# Patient Record
Sex: Male | Born: 1945 | Race: White
Health system: Northeastern US, Academic
[De-identification: ages and names within clinical notes are randomized; demographics above are authoritative.]

## PROBLEM LIST (undated history)

## (undated) DIAGNOSIS — Z86018 Personal history of other benign neoplasm: Secondary | ICD-10-CM

## (undated) DIAGNOSIS — H5440 Blindness, one eye, unspecified eye: Secondary | ICD-10-CM

## (undated) DIAGNOSIS — N186 End stage renal disease: Secondary | ICD-10-CM

## (undated) DIAGNOSIS — N189 Chronic kidney disease, unspecified: Secondary | ICD-10-CM

## (undated) DIAGNOSIS — M1990 Unspecified osteoarthritis, unspecified site: Secondary | ICD-10-CM

## (undated) DIAGNOSIS — E119 Type 2 diabetes mellitus without complications: Secondary | ICD-10-CM

## (undated) DIAGNOSIS — C801 Malignant (primary) neoplasm, unspecified: Secondary | ICD-10-CM

## (undated) DIAGNOSIS — Z992 Dependence on renal dialysis: Secondary | ICD-10-CM

## (undated) DIAGNOSIS — D126 Benign neoplasm of colon, unspecified: Secondary | ICD-10-CM

## (undated) DIAGNOSIS — K2270 Barrett's esophagus without dysplasia: Secondary | ICD-10-CM

## (undated) DIAGNOSIS — N400 Enlarged prostate without lower urinary tract symptoms: Secondary | ICD-10-CM

## (undated) DIAGNOSIS — I10 Essential (primary) hypertension: Secondary | ICD-10-CM

## (undated) DIAGNOSIS — E785 Hyperlipidemia, unspecified: Secondary | ICD-10-CM

## (undated) HISTORY — PX: COLONOSCOPY: SHX5424

## (undated) HISTORY — PX: WHIPPLE PROCEDURE: SHX2667

## (undated) HISTORY — PX: CYSTOSCOPY: SUR368

## (undated) HISTORY — PX: COLECTOMY: SHX59

## (undated) HISTORY — PX: EYE SURGERY: SHX253

---

## 2011-08-30 DIAGNOSIS — I10 Essential (primary) hypertension: Secondary | ICD-10-CM | POA: Diagnosis present

## 2012-10-05 DIAGNOSIS — D126 Benign neoplasm of colon, unspecified: Secondary | ICD-10-CM | POA: Insufficient documentation

## 2012-12-15 DIAGNOSIS — K2270 Barrett's esophagus without dysplasia: Secondary | ICD-10-CM | POA: Insufficient documentation

## 2014-01-17 DIAGNOSIS — N400 Enlarged prostate without lower urinary tract symptoms: Secondary | ICD-10-CM | POA: Insufficient documentation

## 2014-07-12 ENCOUNTER — Inpatient Hospital Stay (HOSPITAL_COMMUNITY): Payer: Medicare PPO

## 2014-07-12 ENCOUNTER — Inpatient Hospital Stay (HOSPITAL_COMMUNITY): Payer: Medicare PPO | Admitting: Certified Registered Nurse Anesthetist

## 2014-07-12 ENCOUNTER — Emergency Department (HOSPITAL_COMMUNITY): Payer: Medicare PPO

## 2014-07-12 ENCOUNTER — Encounter (HOSPITAL_COMMUNITY): Payer: Self-pay

## 2014-07-12 ENCOUNTER — Inpatient Hospital Stay (HOSPITAL_COMMUNITY)
Admission: EM | Admit: 2014-07-12 | Discharge: 2014-07-22 | DRG: 956 | Disposition: A | Discharge status: Skilled Nursing Facility | Payer: Medicare PPO | Attending: Surgery | Admitting: Surgery

## 2014-07-12 ENCOUNTER — Encounter (HOSPITAL_COMMUNITY): Admission: EM | Disposition: A | Discharge status: Skilled Nursing Facility | Payer: Self-pay | Source: Home / Self Care

## 2014-07-12 DIAGNOSIS — G9340 Encephalopathy, unspecified: Secondary | ICD-10-CM | POA: Diagnosis not present

## 2014-07-12 DIAGNOSIS — J811 Chronic pulmonary edema: Secondary | ICD-10-CM

## 2014-07-12 DIAGNOSIS — S72111A Displaced fracture of greater trochanter of right femur, initial encounter for closed fracture: Secondary | ICD-10-CM | POA: Diagnosis present

## 2014-07-12 DIAGNOSIS — E874 Mixed disorder of acid-base balance: Secondary | ICD-10-CM | POA: Diagnosis present

## 2014-07-12 DIAGNOSIS — R4702 Dysphasia: Secondary | ICD-10-CM | POA: Diagnosis not present

## 2014-07-12 DIAGNOSIS — S2243XA Multiple fractures of ribs, bilateral, initial encounter for closed fracture: Secondary | ICD-10-CM | POA: Diagnosis present

## 2014-07-12 DIAGNOSIS — N320 Bladder-neck obstruction: Secondary | ICD-10-CM | POA: Diagnosis not present

## 2014-07-12 DIAGNOSIS — I711 Thoracic aortic aneurysm, ruptured, unspecified: Secondary | ICD-10-CM | POA: Diagnosis present

## 2014-07-12 DIAGNOSIS — R1311 Dysphagia, oral phase: Secondary | ICD-10-CM | POA: Diagnosis not present

## 2014-07-12 DIAGNOSIS — E1165 Type 2 diabetes mellitus with hyperglycemia: Secondary | ICD-10-CM | POA: Diagnosis present

## 2014-07-12 DIAGNOSIS — E871 Hypo-osmolality and hyponatremia: Secondary | ICD-10-CM | POA: Diagnosis not present

## 2014-07-12 DIAGNOSIS — D494 Neoplasm of unspecified behavior of bladder: Secondary | ICD-10-CM | POA: Diagnosis present

## 2014-07-12 DIAGNOSIS — I517 Cardiomegaly: Secondary | ICD-10-CM

## 2014-07-12 DIAGNOSIS — B3749 Other urogenital candidiasis: Secondary | ICD-10-CM | POA: Diagnosis present

## 2014-07-12 DIAGNOSIS — R6521 Severe sepsis with septic shock: Secondary | ICD-10-CM | POA: Diagnosis present

## 2014-07-12 DIAGNOSIS — Z933 Colostomy status: Secondary | ICD-10-CM

## 2014-07-12 DIAGNOSIS — R1313 Dysphagia, pharyngeal phase: Secondary | ICD-10-CM | POA: Diagnosis not present

## 2014-07-12 DIAGNOSIS — E875 Hyperkalemia: Secondary | ICD-10-CM | POA: Diagnosis not present

## 2014-07-12 DIAGNOSIS — E861 Hypovolemia: Secondary | ICD-10-CM | POA: Diagnosis not present

## 2014-07-12 DIAGNOSIS — R0689 Other abnormalities of breathing: Secondary | ICD-10-CM

## 2014-07-12 DIAGNOSIS — E876 Hypokalemia: Secondary | ICD-10-CM | POA: Diagnosis not present

## 2014-07-12 DIAGNOSIS — D62 Acute posthemorrhagic anemia: Secondary | ICD-10-CM | POA: Diagnosis not present

## 2014-07-12 DIAGNOSIS — R579 Shock, unspecified: Secondary | ICD-10-CM

## 2014-07-12 DIAGNOSIS — N210 Calculus in bladder: Secondary | ICD-10-CM | POA: Diagnosis present

## 2014-07-12 DIAGNOSIS — Z419 Encounter for procedure for purposes other than remedying health state, unspecified: Secondary | ICD-10-CM

## 2014-07-12 DIAGNOSIS — S2249XA Multiple fractures of ribs, unspecified side, initial encounter for closed fracture: Secondary | ICD-10-CM

## 2014-07-12 DIAGNOSIS — S2243XB Multiple fractures of ribs, bilateral, initial encounter for open fracture: Secondary | ICD-10-CM

## 2014-07-12 DIAGNOSIS — S2500XA Unspecified injury of thoracic aorta, initial encounter: Secondary | ICD-10-CM

## 2014-07-12 DIAGNOSIS — S0181XA Laceration without foreign body of other part of head, initial encounter: Secondary | ICD-10-CM | POA: Diagnosis present

## 2014-07-12 DIAGNOSIS — N179 Acute kidney failure, unspecified: Secondary | ICD-10-CM | POA: Diagnosis not present

## 2014-07-12 DIAGNOSIS — N132 Hydronephrosis with renal and ureteral calculous obstruction: Secondary | ICD-10-CM | POA: Diagnosis not present

## 2014-07-12 DIAGNOSIS — S2239XA Fracture of one rib, unspecified side, initial encounter for closed fracture: Secondary | ICD-10-CM

## 2014-07-12 DIAGNOSIS — T1490XA Injury, unspecified, initial encounter: Secondary | ICD-10-CM

## 2014-07-12 DIAGNOSIS — S2509XA Other specified injury of thoracic aorta, initial encounter: Principal | ICD-10-CM | POA: Diagnosis present

## 2014-07-12 DIAGNOSIS — J9600 Acute respiratory failure, unspecified whether with hypoxia or hypercapnia: Secondary | ICD-10-CM

## 2014-07-12 DIAGNOSIS — Z87891 Personal history of nicotine dependence: Secondary | ICD-10-CM

## 2014-07-12 DIAGNOSIS — Z9889 Other specified postprocedural states: Secondary | ICD-10-CM

## 2014-07-12 DIAGNOSIS — N189 Chronic kidney disease, unspecified: Secondary | ICD-10-CM | POA: Diagnosis not present

## 2014-07-12 DIAGNOSIS — S72001A Fracture of unspecified part of neck of right femur, initial encounter for closed fracture: Secondary | ICD-10-CM

## 2014-07-12 DIAGNOSIS — J9690 Respiratory failure, unspecified, unspecified whether with hypoxia or hypercapnia: Secondary | ICD-10-CM

## 2014-07-12 DIAGNOSIS — A419 Sepsis, unspecified organism: Secondary | ICD-10-CM | POA: Diagnosis not present

## 2014-07-12 DIAGNOSIS — Z23 Encounter for immunization: Secondary | ICD-10-CM | POA: Diagnosis not present

## 2014-07-12 DIAGNOSIS — Z418 Encounter for other procedures for purposes other than remedying health state: Secondary | ICD-10-CM

## 2014-07-12 DIAGNOSIS — S72009A Fracture of unspecified part of neck of unspecified femur, initial encounter for closed fracture: Secondary | ICD-10-CM

## 2014-07-12 DIAGNOSIS — S3502XA Major laceration of abdominal aorta, initial encounter: Secondary | ICD-10-CM

## 2014-07-12 DIAGNOSIS — S299XXA Unspecified injury of thorax, initial encounter: Secondary | ICD-10-CM

## 2014-07-12 HISTORY — DX: Blindness, one eye, unspecified eye: H54.40

## 2014-07-12 HISTORY — DX: Chronic kidney disease, unspecified: N18.9

## 2014-07-12 HISTORY — PX: THORACIC AORTIC ENDOVASCULAR STENT GRAFT: SHX6112

## 2014-07-12 HISTORY — DX: Personal history of other benign neoplasm: Z86.018

## 2014-07-12 LAB — PROTIME-INR
INR: 1.2 (ref 0.00–1.49)
INR: 1.44 (ref 0.00–1.49)
Prothrombin Time: 15.3 seconds — ABNORMAL HIGH (ref 11.6–15.2)
Prothrombin Time: 17.7 seconds — ABNORMAL HIGH (ref 11.6–15.2)

## 2014-07-12 LAB — URINE MICROSCOPIC-ADD ON

## 2014-07-12 LAB — POCT I-STAT 7, (LYTES, BLD GAS, ICA,H+H)
ACID-BASE DEFICIT: 10 mmol/L — AB (ref 0.0–2.0)
ACID-BASE DEFICIT: 9 mmol/L — AB (ref 0.0–2.0)
BICARBONATE: 16.9 meq/L — AB (ref 20.0–24.0)
BICARBONATE: 16.7 meq/L — AB (ref 20.0–24.0)
Calcium, Ion: 1.35 mmol/L — ABNORMAL HIGH (ref 1.13–1.30)
Calcium, Ion: 1.18 mmol/L (ref 1.13–1.30)
HCT: 23 % — ABNORMAL LOW (ref 39.0–52.0)
HEMATOCRIT: 23 % — AB (ref 39.0–52.0)
Hemoglobin: 7.8 g/dL — ABNORMAL LOW (ref 13.0–17.0)
Hemoglobin: 7.8 g/dL — ABNORMAL LOW (ref 13.0–17.0)
O2 SAT: 100 %
O2 Saturation: 100 %
PH ART: 7.252 — AB (ref 7.350–7.450)
POTASSIUM: 5.1 meq/L (ref 3.7–5.3)
Patient temperature: 36.9
Patient temperature: 36.2
Potassium: 5.5 mEq/L — ABNORMAL HIGH (ref 3.7–5.3)
SODIUM: 129 meq/L — AB (ref 137–147)
Sodium: 131 mEq/L — ABNORMAL LOW (ref 137–147)
TCO2: 18 mmol/L (ref 0–100)
TCO2: 18 mmol/L (ref 0–100)
pCO2 arterial: 38.2 mmHg (ref 35.0–45.0)
pCO2 arterial: 34.8 mmHg — ABNORMAL LOW (ref 35.0–45.0)
pH, Arterial: 7.284 — ABNORMAL LOW (ref 7.350–7.450)
pO2, Arterial: 329 mmHg — ABNORMAL HIGH (ref 80.0–100.0)
pO2, Arterial: 318 mmHg — ABNORMAL HIGH (ref 80.0–100.0)

## 2014-07-12 LAB — CBC WITH DIFFERENTIAL/PLATELET
BASOS ABS: 0 10*3/uL (ref 0.0–0.1)
Basophils Relative: 0 % (ref 0–1)
EOS ABS: 0 10*3/uL (ref 0.0–0.7)
Eosinophils Relative: 0 % (ref 0–5)
HCT: 32.6 % — ABNORMAL LOW (ref 39.0–52.0)
Hemoglobin: 10.7 g/dL — ABNORMAL LOW (ref 13.0–17.0)
LYMPHS ABS: 2.8 10*3/uL (ref 0.7–4.0)
Lymphocytes Relative: 11 % — ABNORMAL LOW (ref 12–46)
MCH: 28.9 pg (ref 26.0–34.0)
MCHC: 32.8 g/dL (ref 30.0–36.0)
MCV: 88.1 fL (ref 78.0–100.0)
MONOS PCT: 12 % (ref 3–12)
Monocytes Absolute: 3 10*3/uL — ABNORMAL HIGH (ref 0.1–1.0)
NEUTROS ABS: 19.2 10*3/uL — AB (ref 1.7–7.7)
Neutrophils Relative %: 77 % (ref 43–77)
PLATELETS: 190 10*3/uL (ref 150–400)
RBC: 3.7 MIL/uL — ABNORMAL LOW (ref 4.22–5.81)
RDW: 14 % (ref 11.5–15.5)
WBC: 25 10*3/uL — ABNORMAL HIGH (ref 4.0–10.5)

## 2014-07-12 LAB — POCT I-STAT 3, ART BLOOD GAS (G3+)
Acid-base deficit: 7 mmol/L — ABNORMAL HIGH (ref 0.0–2.0)
Bicarbonate: 18.5 mEq/L — ABNORMAL LOW (ref 20.0–24.0)
O2 Saturation: 100 %
PCO2 ART: 37 mmHg (ref 35.0–45.0)
PH ART: 7.304 — AB (ref 7.350–7.450)
TCO2: 20 mmol/L (ref 0–100)
pO2, Arterial: 190 mmHg — ABNORMAL HIGH (ref 80.0–100.0)

## 2014-07-12 LAB — COMPREHENSIVE METABOLIC PANEL
ALBUMIN: 3.6 g/dL (ref 3.5–5.2)
ALK PHOS: 101 U/L (ref 39–117)
ALT: 91 U/L — ABNORMAL HIGH (ref 0–53)
AST: 175 U/L — AB (ref 0–37)
Anion gap: 23 — ABNORMAL HIGH (ref 5–15)
BUN: 49 mg/dL — AB (ref 6–23)
CO2: 14 mEq/L — ABNORMAL LOW (ref 19–32)
Calcium: 9.8 mg/dL (ref 8.4–10.5)
Chloride: 91 mEq/L — ABNORMAL LOW (ref 96–112)
Creatinine, Ser: 4.31 mg/dL — ABNORMAL HIGH (ref 0.50–1.35)
GFR calc Af Amer: 15 mL/min — ABNORMAL LOW (ref >90)
GFR calc non Af Amer: 13 mL/min — ABNORMAL LOW (ref >90)
Glucose, Bld: 414 mg/dL — ABNORMAL HIGH (ref 70–99)
POTASSIUM: 5.6 meq/L — AB (ref 3.7–5.3)
Sodium: 128 mEq/L — ABNORMAL LOW (ref 137–147)
TOTAL PROTEIN: 7.9 g/dL (ref 6.0–8.3)
Total Bilirubin: 1.1 mg/dL (ref 0.3–1.2)

## 2014-07-12 LAB — I-STAT CHEM 8, ED
BUN: 52 mg/dL — AB (ref 6–23)
CHLORIDE: 102 meq/L (ref 96–112)
CREATININE: 4.7 mg/dL — AB (ref 0.50–1.35)
Calcium, Ion: 1.08 mmol/L — ABNORMAL LOW (ref 1.13–1.30)
Glucose, Bld: 432 mg/dL — ABNORMAL HIGH (ref 70–99)
HCT: 41 % (ref 39.0–52.0)
Hemoglobin: 13.9 g/dL (ref 13.0–17.0)
Potassium: 5.9 mEq/L — ABNORMAL HIGH (ref 3.7–5.3)
Sodium: 126 mEq/L — ABNORMAL LOW (ref 137–147)
TCO2: 13 mmol/L (ref 0–100)

## 2014-07-12 LAB — I-STAT VENOUS BLOOD GAS, ED
Acid-base deficit: 12 mmol/L — ABNORMAL HIGH (ref 0.0–2.0)
BICARBONATE: 16.4 meq/L — AB (ref 20.0–24.0)
O2 Saturation: 56 %
PCO2 VEN: 49.7 mmHg (ref 45.0–50.0)
PH VEN: 7.14 — AB (ref 7.250–7.300)
Patient temperature: 102.4
TCO2: 18 mmol/L (ref 0–100)
pO2, Ven: 43 mmHg (ref 30.0–45.0)

## 2014-07-12 LAB — CBC
HEMATOCRIT: 32.5 % — AB (ref 39.0–52.0)
Hemoglobin: 10.9 g/dL — ABNORMAL LOW (ref 13.0–17.0)
MCH: 28.8 pg (ref 26.0–34.0)
MCHC: 33.5 g/dL (ref 30.0–36.0)
MCV: 85.8 fL (ref 78.0–100.0)
PLATELETS: 116 10*3/uL — AB (ref 150–400)
RBC: 3.79 MIL/uL — AB (ref 4.22–5.81)
RDW: 14 % (ref 11.5–15.5)
WBC: 11.8 10*3/uL — AB (ref 4.0–10.5)

## 2014-07-12 LAB — RAPID URINE DRUG SCREEN, HOSP PERFORMED
Amphetamines: NOT DETECTED
BARBITURATES: NOT DETECTED
BENZODIAZEPINES: POSITIVE — AB
Cocaine: NOT DETECTED
Opiates: POSITIVE — AB
Tetrahydrocannabinol: NOT DETECTED

## 2014-07-12 LAB — CBG MONITORING, ED: GLUCOSE-CAPILLARY: 391 mg/dL — AB (ref 70–99)

## 2014-07-12 LAB — BASIC METABOLIC PANEL
Anion gap: 13 (ref 5–15)
Anion gap: 14 (ref 5–15)
BUN: 45 mg/dL — AB (ref 6–23)
BUN: 40 mg/dL — AB (ref 6–23)
CALCIUM: 7.9 mg/dL — AB (ref 8.4–10.5)
CO2: 18 mEq/L — ABNORMAL LOW (ref 19–32)
CO2: 17 mEq/L — ABNORMAL LOW (ref 19–32)
Calcium: 8.3 mg/dL — ABNORMAL LOW (ref 8.4–10.5)
Chloride: 100 mEq/L (ref 96–112)
Chloride: 104 mEq/L (ref 96–112)
Creatinine, Ser: 3.75 mg/dL — ABNORMAL HIGH (ref 0.50–1.35)
Creatinine, Ser: 3.5 mg/dL — ABNORMAL HIGH (ref 0.50–1.35)
GFR calc Af Amer: 18 mL/min — ABNORMAL LOW (ref >90)
GFR calc Af Amer: 19 mL/min — ABNORMAL LOW (ref >90)
GFR, EST NON AFRICAN AMERICAN: 15 mL/min — AB (ref >90)
GFR, EST NON AFRICAN AMERICAN: 16 mL/min — AB (ref >90)
GLUCOSE: 107 mg/dL — AB (ref 70–99)
Glucose, Bld: 327 mg/dL — ABNORMAL HIGH (ref 70–99)
POTASSIUM: 4 meq/L (ref 3.7–5.3)
Potassium: 5.6 mEq/L — ABNORMAL HIGH (ref 3.7–5.3)
Sodium: 131 mEq/L — ABNORMAL LOW (ref 137–147)
Sodium: 135 mEq/L — ABNORMAL LOW (ref 137–147)

## 2014-07-12 LAB — URINALYSIS, ROUTINE W REFLEX MICROSCOPIC
Bilirubin Urine: NEGATIVE
GLUCOSE, UA: 250 mg/dL — AB
Ketones, ur: 15 mg/dL — AB
Nitrite: POSITIVE — AB
Protein, ur: >300 mg/dL — AB
Specific Gravity, Urine: 1.014 (ref 1.005–1.030)
Urobilinogen, UA: 0.2 mg/dL (ref 0.0–1.0)
pH: 6 (ref 5.0–8.0)

## 2014-07-12 LAB — PREPARE FRESH FROZEN PLASMA
UNIT DIVISION: 0
Unit division: 0

## 2014-07-12 LAB — POCT I-STAT 4, (NA,K, GLUC, HGB,HCT)
Glucose, Bld: 339 mg/dL — ABNORMAL HIGH (ref 70–99)
HCT: 26 % — ABNORMAL LOW (ref 39.0–52.0)
Hemoglobin: 8.8 g/dL — ABNORMAL LOW (ref 13.0–17.0)
POTASSIUM: 5.5 meq/L — AB (ref 3.7–5.3)
SODIUM: 133 meq/L — AB (ref 137–147)

## 2014-07-12 LAB — CG4 I-STAT (LACTIC ACID): Lactic Acid, Venous: 3.85 mmol/L — ABNORMAL HIGH (ref 0.5–2.2)

## 2014-07-12 LAB — PREPARE RBC (CROSSMATCH)

## 2014-07-12 LAB — APTT: APTT: 35 s (ref 24–37)

## 2014-07-12 LAB — GLUCOSE, CAPILLARY
GLUCOSE-CAPILLARY: 319 mg/dL — AB (ref 70–99)
GLUCOSE-CAPILLARY: 287 mg/dL — AB (ref 70–99)
GLUCOSE-CAPILLARY: 250 mg/dL — AB (ref 70–99)
Glucose-Capillary: 201 mg/dL — ABNORMAL HIGH (ref 70–99)

## 2014-07-12 LAB — TROPONIN I

## 2014-07-12 LAB — MRSA PCR SCREENING: MRSA BY PCR: NEGATIVE

## 2014-07-12 LAB — ETHANOL

## 2014-07-12 LAB — I-STAT CG4 LACTIC ACID, ED: LACTIC ACID, VENOUS: 4.58 mmol/L — AB (ref 0.5–2.2)

## 2014-07-12 LAB — MAGNESIUM: Magnesium: 1.4 mg/dL — ABNORMAL LOW (ref 1.5–2.5)

## 2014-07-12 LAB — ABO/RH: ABO/RH(D): A POS

## 2014-07-12 LAB — TRIGLYCERIDES: TRIGLYCERIDES: 191 mg/dL — AB (ref <150)

## 2014-07-12 LAB — LACTIC ACID, PLASMA
Lactic Acid, Venous: 3 mmol/L — ABNORMAL HIGH (ref 0.5–2.2)
Lactic Acid, Venous: 2 mmol/L (ref 0.5–2.2)

## 2014-07-12 SURGERY — INSERTION, ENDOVASCULAR STENT GRAFT, AORTA, THORACIC
Anesthesia: General | Site: Chest

## 2014-07-12 MED ORDER — TETANUS-DIPHTHERIA TOXOIDS TD 5-2 LFU IM INJ
0.5000 mL | INJECTION | Freq: Once | INTRAMUSCULAR | Status: AC
  Administered 2014-07-12: 0.5 mL via INTRAMUSCULAR

## 2014-07-12 MED ORDER — SODIUM CHLORIDE 0.45 % IV BOLUS: 500.0000 mL | Freq: Once | INTRAVENOUS | Status: DC

## 2014-07-12 MED ORDER — ROCURONIUM BROMIDE 100 MG/10ML IV SOLN
INTRAVENOUS | Status: DC | PRN
  Administered 2014-07-12 (×2): 50 mg via INTRAVENOUS

## 2014-07-12 MED ORDER — VANCOMYCIN HCL IN DEXTROSE 1-5 GM/200ML-% IV SOLN
1000.0000 mg | INTRAVENOUS | Status: DC
  Administered 2014-07-14: 1000 mg via INTRAVENOUS
  Filled 2014-07-12: qty 200

## 2014-07-12 MED ORDER — MORPHINE SULFATE 2 MG/ML IJ SOLN
2.0000 mg | INTRAMUSCULAR | Status: DC | PRN
  Administered 2014-07-15: 4 mg via INTRAVENOUS
  Administered 2014-07-15: 2 mg via INTRAVENOUS
  Administered 2014-07-15 – 2014-07-16 (×3): 4 mg via INTRAVENOUS
  Filled 2014-07-12: qty 2
  Filled 2014-07-12: qty 1
  Filled 2014-07-12 (×3): qty 2

## 2014-07-12 MED ORDER — TETANUS-DIPHTH-ACELL PERTUSSIS 5-2.5-18.5 LF-MCG/0.5 IM SUSP
0.5000 mL | Freq: Once | INTRAMUSCULAR | Status: DC
  Filled 2014-07-12: qty 0.5

## 2014-07-12 MED ORDER — HEPARIN SODIUM (PORCINE) 1000 UNIT/ML IJ SOLN
INTRAMUSCULAR | Status: AC
  Filled 2014-07-12: qty 1

## 2014-07-12 MED ORDER — SUCCINYLCHOLINE CHLORIDE 20 MG/ML IJ SOLN
INTRAMUSCULAR | Status: AC
  Administered 2014-07-12: 07:00:00
  Filled 2014-07-12: qty 1

## 2014-07-12 MED ORDER — SODIUM CHLORIDE 0.9 % IV SOLN
25.0000 ug/h | INTRAVENOUS | Status: DC
  Administered 2014-07-12: 150 ug/h via INTRAVENOUS
  Administered 2014-07-12: 60 ug/h via INTRAVENOUS
  Administered 2014-07-13 – 2014-07-14 (×2): 150 ug/h via INTRAVENOUS
  Administered 2014-07-14: 200 ug/h via INTRAVENOUS
  Administered 2014-07-15: 150 ug/h via INTRAVENOUS
  Filled 2014-07-12 (×4): qty 50

## 2014-07-12 MED ORDER — ROCURONIUM BROMIDE 50 MG/5ML IV SOLN
INTRAVENOUS | Status: AC
  Filled 2014-07-12: qty 2

## 2014-07-12 MED ORDER — SODIUM CHLORIDE 0.9 % IV BOLUS (SEPSIS)
1000.0000 mL | Freq: Once | INTRAVENOUS | Status: AC
  Administered 2014-07-12: 1000 mL via INTRAVENOUS

## 2014-07-12 MED ORDER — CETYLPYRIDINIUM CHLORIDE 0.05 % MT LIQD
7.0000 mL | Freq: Four times a day (QID) | OROMUCOSAL | Status: DC
  Administered 2014-07-12 – 2014-07-22 (×40): 7 mL via OROMUCOSAL

## 2014-07-12 MED ORDER — POTASSIUM CHLORIDE CRYS ER 20 MEQ PO TBCR
20.0000 meq | EXTENDED_RELEASE_TABLET | Freq: Every day | ORAL | Status: AC | PRN
Start: 2014-07-12 — End: 2014-07-19
  Administered 2014-07-19: 40 meq via ORAL
  Filled 2014-07-12: qty 2

## 2014-07-12 MED ORDER — FENTANYL CITRATE 0.05 MG/ML IJ SOLN
INTRAMUSCULAR | Status: AC
  Filled 2014-07-12: qty 5

## 2014-07-12 MED ORDER — ALUM & MAG HYDROXIDE-SIMETH 200-200-20 MG/5ML PO SUSP: 15.0000 mL | ORAL | Status: DC | PRN

## 2014-07-12 MED ORDER — PHENYLEPHRINE HCL 10 MG/ML IJ SOLN
10.0000 mg | INTRAVENOUS | Status: DC | PRN
  Administered 2014-07-12: 10 ug/min via INTRAVENOUS

## 2014-07-12 MED ORDER — INSULIN ASPART 100 UNIT/ML IV SOLN
10.0000 [IU] | Freq: Once | INTRAVENOUS | Status: AC
  Administered 2014-07-12: 10 [IU] via INTRAVENOUS
  Filled 2014-07-12: qty 1

## 2014-07-12 MED ORDER — ROCURONIUM BROMIDE 50 MG/5ML IV SOLN
INTRAVENOUS | Status: AC | PRN
  Administered 2014-07-12: 50 mg via INTRAVENOUS

## 2014-07-12 MED ORDER — SODIUM CHLORIDE 0.9 % IV SOLN
250.0000 [IU] | INTRAVENOUS | Status: DC | PRN
  Administered 2014-07-12: 8.4 [IU]/h via INTRAVENOUS

## 2014-07-12 MED ORDER — FENTANYL CITRATE 0.05 MG/ML IJ SOLN
INTRAMUSCULAR | Status: AC
  Filled 2014-07-12: qty 2

## 2014-07-12 MED ORDER — PHENYLEPHRINE 40 MCG/ML (10ML) SYRINGE FOR IV PUSH (FOR BLOOD PRESSURE SUPPORT)
PREFILLED_SYRINGE | INTRAVENOUS | Status: AC
  Filled 2014-07-12: qty 10

## 2014-07-12 MED ORDER — PANTOPRAZOLE SODIUM 40 MG IV SOLR
40.0000 mg | Freq: Every day | INTRAVENOUS | Status: DC
  Administered 2014-07-12 – 2014-07-19 (×8): 40 mg via INTRAVENOUS
  Filled 2014-07-12 (×9): qty 40

## 2014-07-12 MED ORDER — GUAIFENESIN-DM 100-10 MG/5ML PO SYRP: 15.0000 mL | ORAL_SOLUTION | ORAL | Status: DC | PRN

## 2014-07-12 MED ORDER — SODIUM CHLORIDE 0.9 % IV SOLN
INTRAVENOUS | Status: AC
  Administered 2014-07-12: 8.4 [IU]/h via INTRAVENOUS
  Filled 2014-07-12: qty 2.5

## 2014-07-12 MED ORDER — LACTATED RINGERS IV SOLN
INTRAVENOUS | Status: DC | PRN
  Administered 2014-07-12: 11:00:00 via INTRAVENOUS

## 2014-07-12 MED ORDER — CALCIUM CHLORIDE 10 % IV SOLN
INTRAVENOUS | Status: AC
  Filled 2014-07-12: qty 10

## 2014-07-12 MED ORDER — LIDOCAINE-EPINEPHRINE 1 %-1:100000 IJ SOLN
10.0000 mL | Freq: Once | INTRAMUSCULAR | Status: AC
  Administered 2014-07-12: 10 mL via INTRADERMAL
  Filled 2014-07-12: qty 1

## 2014-07-12 MED ORDER — DEXTROSE 5 % IV SOLN
2.0000 g | INTRAVENOUS | Status: DC
  Administered 2014-07-12 – 2014-07-13 (×2): 2 g via INTRAVENOUS
  Filled 2014-07-12: qty 2

## 2014-07-12 MED ORDER — PIPERACILLIN-TAZOBACTAM 3.375 G IVPB
3.3750 g | Freq: Three times a day (TID) | INTRAVENOUS | Status: DC
  Administered 2014-07-12 – 2014-07-14 (×7): 3.375 g via INTRAVENOUS
  Filled 2014-07-12 (×9): qty 50

## 2014-07-12 MED ORDER — HYDRALAZINE HCL 20 MG/ML IJ SOLN
5.0000 mg | INTRAMUSCULAR | Status: AC | PRN
  Administered 2014-07-12 – 2014-07-20 (×2): 5 mg via INTRAVENOUS
  Filled 2014-07-12 (×3): qty 1

## 2014-07-12 MED ORDER — PROPOFOL 10 MG/ML IV EMUL
INTRAVENOUS | Status: AC
  Filled 2014-07-12: qty 100

## 2014-07-12 MED ORDER — MIDAZOLAM HCL 2 MG/2ML IJ SOLN
INTRAMUSCULAR | Status: AC
  Filled 2014-07-12: qty 2

## 2014-07-12 MED ORDER — PIPERACILLIN-TAZOBACTAM 3.375 G IVPB: 3.3750 g | Freq: Three times a day (TID) | INTRAVENOUS | Status: DC

## 2014-07-12 MED ORDER — PHENOL 1.4 % MT LIQD: 1.0000 | OROMUCOSAL | Status: DC | PRN

## 2014-07-12 MED ORDER — OXYCODONE-ACETAMINOPHEN 5-325 MG PO TABS
1.0000 | ORAL_TABLET | ORAL | Status: DC | PRN
  Administered 2014-07-16 – 2014-07-18 (×8): 2 via ORAL
  Administered 2014-07-18: 1 via ORAL
  Administered 2014-07-18 – 2014-07-21 (×10): 2 via ORAL
  Filled 2014-07-12 (×14): qty 2
  Filled 2014-07-12: qty 1
  Filled 2014-07-12 (×4): qty 2

## 2014-07-12 MED ORDER — CEFAZOLIN SODIUM-DEXTROSE 2-3 GM-% IV SOLR
INTRAVENOUS | Status: DC | PRN
  Administered 2014-07-12: 2 g via INTRAVENOUS

## 2014-07-12 MED ORDER — SODIUM CHLORIDE 0.9 % IV SOLN
INTRAVENOUS | Status: DC | PRN
  Administered 2014-07-12: 11:00:00 via INTRAVENOUS

## 2014-07-12 MED ORDER — ONDANSETRON HCL 4 MG/2ML IJ SOLN
INTRAMUSCULAR | Status: AC
  Filled 2014-07-12: qty 2

## 2014-07-12 MED ORDER — SUCCINYLCHOLINE CHLORIDE 20 MG/ML IJ SOLN
INTRAMUSCULAR | Status: AC
  Filled 2014-07-12: qty 1

## 2014-07-12 MED ORDER — ONDANSETRON HCL 4 MG/2ML IJ SOLN: 4.0000 mg | Freq: Four times a day (QID) | INTRAMUSCULAR | Status: DC | PRN

## 2014-07-12 MED ORDER — ROCURONIUM BROMIDE 50 MG/5ML IV SOLN
INTRAVENOUS | Status: AC
  Filled 2014-07-12: qty 3

## 2014-07-12 MED ORDER — VANCOMYCIN HCL IN DEXTROSE 1-5 GM/200ML-% IV SOLN: 1000.0000 mg | INTRAVENOUS | Status: DC

## 2014-07-12 MED ORDER — FENTANYL CITRATE 0.05 MG/ML IJ SOLN: 50.0000 ug | Freq: Once | INTRAMUSCULAR | Status: DC

## 2014-07-12 MED ORDER — SODIUM CHLORIDE 0.9 % IV SOLN: INTRAVENOUS | Status: DC | PRN

## 2014-07-12 MED ORDER — METOPROLOL TARTRATE 1 MG/ML IV SOLN
2.0000 mg | INTRAVENOUS | Status: DC | PRN
  Filled 2014-07-12: qty 5

## 2014-07-12 MED ORDER — SODIUM CHLORIDE 0.9 % IV SOLN: 500.0000 mL | Freq: Once | INTRAVENOUS | Status: AC | PRN

## 2014-07-12 MED ORDER — CALCIUM GLUCONATE 10 % IV SOLN: 1.0000 g | Freq: Once | INTRAVENOUS | Status: DC

## 2014-07-12 MED ORDER — ETOMIDATE 2 MG/ML IV SOLN
INTRAVENOUS | Status: AC
  Filled 2014-07-12: qty 20

## 2014-07-12 MED ORDER — PIPERACILLIN-TAZOBACTAM 3.375 G IVPB 30 MIN
3.3750 g | Freq: Once | INTRAVENOUS | Status: DC
  Filled 2014-07-12: qty 50

## 2014-07-12 MED ORDER — SODIUM BICARBONATE 8.4 % IV SOLN
INTRAVENOUS | Status: DC | PRN
  Administered 2014-07-12 (×2): 50 meq via INTRAVENOUS

## 2014-07-12 MED ORDER — DOCUSATE SODIUM 100 MG PO CAPS: 100.0000 mg | ORAL_CAPSULE | Freq: Every day | ORAL | Status: DC

## 2014-07-12 MED ORDER — LIDOCAINE HCL (CARDIAC) 20 MG/ML IV SOLN
INTRAVENOUS | Status: AC
  Filled 2014-07-12: qty 5

## 2014-07-12 MED ORDER — ONDANSETRON HCL 4 MG PO TABS: 4.0000 mg | ORAL_TABLET | Freq: Four times a day (QID) | ORAL | Status: DC | PRN

## 2014-07-12 MED ORDER — HYDROMORPHONE HCL 1 MG/ML IJ SOLN: 0.2500 mg | INTRAMUSCULAR | Status: DC | PRN

## 2014-07-12 MED ORDER — ETOMIDATE 2 MG/ML IV SOLN
INTRAVENOUS | Status: AC | PRN
  Administered 2014-07-12: 20 mg via INTRAVENOUS

## 2014-07-12 MED ORDER — FENTANYL CITRATE 0.05 MG/ML IJ SOLN: 50.0000 ug | INTRAMUSCULAR | Status: DC | PRN

## 2014-07-12 MED ORDER — PIPERACILLIN-TAZOBACTAM 3.375 G IVPB 30 MIN: 3.3750 g | Freq: Once | INTRAVENOUS | Status: DC

## 2014-07-12 MED ORDER — MAGNESIUM SULFATE 2 GM/50ML IV SOLN
2.0000 g | Freq: Every day | INTRAVENOUS | Status: DC | PRN
  Filled 2014-07-12: qty 50

## 2014-07-12 MED ORDER — EPHEDRINE SULFATE 50 MG/ML IJ SOLN
INTRAMUSCULAR | Status: AC
  Filled 2014-07-12: qty 1

## 2014-07-12 MED ORDER — HEPARIN SODIUM (PORCINE) 5000 UNIT/ML IJ SOLN
INTRAMUSCULAR | Status: DC | PRN
  Administered 2014-07-12: 500 mL

## 2014-07-12 MED ORDER — IOHEXOL 350 MG/ML SOLN
100.0000 mL | Freq: Once | INTRAVENOUS | Status: AC | PRN
  Administered 2014-07-12: 100 mL via INTRAVENOUS

## 2014-07-12 MED ORDER — ACETAMINOPHEN 650 MG RE SUPP
325.0000 mg | RECTAL | Status: DC | PRN
  Administered 2014-07-13: 325 mg via RECTAL
  Filled 2014-07-12: qty 1

## 2014-07-12 MED ORDER — CEFTRIAXONE SODIUM IN DEXTROSE 20 MG/ML IV SOLN: 1.0000 g | INTRAVENOUS | Status: DC

## 2014-07-12 MED ORDER — INSULIN REGULAR HUMAN 100 UNIT/ML IJ SOLN
INTRAMUSCULAR | Status: DC
  Administered 2014-07-12: 9.9 [IU]/h via INTRAVENOUS
  Administered 2014-07-12: 11.4 [IU]/h via INTRAVENOUS
  Filled 2014-07-12: qty 2.5

## 2014-07-12 MED ORDER — FENTANYL CITRATE 0.05 MG/ML IJ SOLN
INTRAMUSCULAR | Status: DC | PRN
  Administered 2014-07-12: 50 ug via INTRAVENOUS
  Administered 2014-07-12 (×3): 100 ug via INTRAVENOUS
  Administered 2014-07-12 (×3): 50 ug via INTRAVENOUS

## 2014-07-12 MED ORDER — ONDANSETRON HCL 4 MG/2ML IJ SOLN
4.0000 mg | Freq: Once | INTRAMUSCULAR | Status: AC | PRN
Start: 2014-07-12 — End: 2014-07-12

## 2014-07-12 MED ORDER — DEXTROSE-NACL 5-0.45 % IV SOLN
INTRAVENOUS | Status: DC
  Administered 2014-07-12: 100 mL/h via INTRAVENOUS
  Administered 2014-07-13: 1 mL via INTRAVENOUS

## 2014-07-12 MED ORDER — ACETAMINOPHEN 325 MG PO TABS
325.0000 mg | ORAL_TABLET | ORAL | Status: DC | PRN
Start: 2014-07-12 — End: 2014-07-14

## 2014-07-12 MED ORDER — SODIUM CHLORIDE 0.9 % IV SOLN: Freq: Once | INTRAVENOUS | Status: DC

## 2014-07-12 MED ORDER — CALCIUM GLUCONATE 10 % IV SOLN
INTRAVENOUS | Status: AC
  Filled 2014-07-12: qty 10

## 2014-07-12 MED ORDER — ALBUMIN HUMAN 5 % IV SOLN
25.0000 g | Freq: Once | INTRAVENOUS | Status: AC
  Administered 2014-07-12: 25 g via INTRAVENOUS
  Filled 2014-07-12 (×2): qty 500

## 2014-07-12 MED ORDER — SODIUM CHLORIDE 0.9 % IV SOLN
INTRAVENOUS | Status: AC
  Administered 2014-07-12: 10.4 [IU]/h via INTRAVENOUS
  Filled 2014-07-12: qty 2.5

## 2014-07-12 MED ORDER — CHLORHEXIDINE GLUCONATE 0.12 % MT SOLN
15.0000 mL | Freq: Two times a day (BID) | OROMUCOSAL | Status: DC
  Administered 2014-07-12 – 2014-07-19 (×15): 15 mL via OROMUCOSAL
  Filled 2014-07-12 (×15): qty 15

## 2014-07-12 MED ORDER — PROPOFOL 10 MG/ML IV EMUL
0.0000 ug/kg/min | INTRAVENOUS | Status: DC
  Administered 2014-07-12 – 2014-07-13 (×2): 10 ug/kg/min via INTRAVENOUS
  Administered 2014-07-14: 5 ug/kg/min via INTRAVENOUS
  Administered 2014-07-15: 15 ug/kg/min via INTRAVENOUS
  Filled 2014-07-12 (×5): qty 100

## 2014-07-12 MED ORDER — PROPOFOL 10 MG/ML IV BOLUS
INTRAVENOUS | Status: AC | PRN
  Administered 2014-07-12 (×2): 10 mg via INTRAVENOUS

## 2014-07-12 MED ORDER — 0.9 % SODIUM CHLORIDE (POUR BTL) OPTIME
TOPICAL | Status: DC | PRN
  Administered 2014-07-12: 1000 mL

## 2014-07-12 MED ORDER — MIDAZOLAM HCL 5 MG/5ML IJ SOLN
INTRAMUSCULAR | Status: AC | PRN
  Administered 2014-07-12: 2 mg via INTRAVENOUS

## 2014-07-12 MED ORDER — PANTOPRAZOLE SODIUM 40 MG PO TBEC: 40.0000 mg | DELAYED_RELEASE_TABLET | Freq: Every day | ORAL | Status: DC

## 2014-07-12 MED ORDER — SODIUM CHLORIDE 0.9 % IV SOLN
INTRAVENOUS | Status: DC
  Administered 2014-07-12: 150 mL/h via INTRAVENOUS
  Administered 2014-07-13: 17:00:00 via INTRAVENOUS
  Administered 2014-07-13 – 2014-07-14 (×3): 1 mL via INTRAVENOUS
  Administered 2014-07-17 – 2014-07-21 (×3): via INTRAVENOUS

## 2014-07-12 MED ORDER — FENTANYL BOLUS VIA INFUSION
25.0000 ug | INTRAVENOUS | Status: DC | PRN
  Filled 2014-07-12: qty 25

## 2014-07-12 MED ORDER — SODIUM CHLORIDE 0.9 % IV SOLN: INTRAVENOUS | Status: DC

## 2014-07-12 MED ORDER — SODIUM CHLORIDE 0.9 % IV BOLUS (SEPSIS)
500.0000 mL | Freq: Once | INTRAVENOUS | Status: AC
  Administered 2014-07-12: 500 mL via INTRAVENOUS

## 2014-07-12 MED ORDER — SODIUM POLYSTYRENE SULFONATE 15 GM/60ML PO SUSP
30.0000 g | Freq: Once | ORAL | Status: AC
  Administered 2014-07-12: 30 g
  Filled 2014-07-12: qty 120

## 2014-07-12 MED ORDER — PANTOPRAZOLE SODIUM 40 MG PO TBEC
40.0000 mg | DELAYED_RELEASE_TABLET | Freq: Every day | ORAL | Status: DC
  Administered 2014-07-20 – 2014-07-21 (×2): 40 mg via ORAL
  Filled 2014-07-12 (×2): qty 1

## 2014-07-12 MED ORDER — CALCIUM CHLORIDE 10 % IV SOLN
INTRAVENOUS | Status: AC | PRN
  Administered 2014-07-12: 1 g via INTRAVENOUS

## 2014-07-12 MED ORDER — IODIXANOL 320 MG/ML IV SOLN
INTRAVENOUS | Status: DC | PRN
  Administered 2014-07-12: 27.3 mL via INTRAVENOUS

## 2014-07-12 MED ORDER — LABETALOL HCL 5 MG/ML IV SOLN
10.0000 mg | INTRAVENOUS | Status: DC | PRN
  Administered 2014-07-13 – 2014-07-14 (×3): 10 mg via INTRAVENOUS
  Filled 2014-07-12 (×4): qty 4

## 2014-07-12 MED ORDER — STERILE WATER FOR INJECTION IJ SOLN
INTRAMUSCULAR | Status: AC
  Filled 2014-07-12: qty 10

## 2014-07-12 MED ORDER — VANCOMYCIN HCL IN DEXTROSE 1-5 GM/200ML-% IV SOLN: 1000.0000 mg | Freq: Once | INTRAVENOUS | Status: DC

## 2014-07-12 SURGICAL SUPPLY — 77 items
BAG BANDED W/RUBBER/TAPE 36X54 (MISCELLANEOUS) IMPLANT
BAG SNAP BAND KOVER 36X36 (MISCELLANEOUS) IMPLANT
BLADE SURG CLIPPER 3M 9600 (MISCELLANEOUS) IMPLANT
CANISTER SUCTION 2500CC (MISCELLANEOUS) ×4 IMPLANT
CATH ACCU-VU SIZ PIG 5F 100CM (CATHETERS) ×4 IMPLANT
CATH BEACON 5.038 65CM KMP-01 (CATHETERS) ×4 IMPLANT
CATH OMNI FLUSH .035X70CM (CATHETERS) IMPLANT
CLIP TI MEDIUM 24 (CLIP) IMPLANT
CLIP TI WIDE RED SMALL 24 (CLIP) IMPLANT
COVER DOME SNAP 22 D (MISCELLANEOUS) IMPLANT
COVER MAYO STAND STRL (DRAPES) ×8 IMPLANT
COVER PROBE W GEL 5X96 (DRAPES) ×4 IMPLANT
COVER SURGICAL LIGHT HANDLE (MISCELLANEOUS) ×4 IMPLANT
DEVICE CLOSURE PERCLS PRGLD 6F (VASCULAR PRODUCTS) ×4 IMPLANT
DRAPE PROXIMA HALF (DRAPES) ×4 IMPLANT
DRAPE TABLE COVER HEAVY DUTY (DRAPES) ×4 IMPLANT
DRSG TEGADERM 2-3/8X2-3/4 SM (GAUZE/BANDAGES/DRESSINGS) ×4 IMPLANT
DRYSEAL FLEXSHEATH 22FR 33CM (SHEATH) ×2
ELECT REM PT RETURN 9FT ADLT (ELECTROSURGICAL) ×8
ELECTRODE REM PT RTRN 9FT ADLT (ELECTROSURGICAL) ×4 IMPLANT
ENDOPROSTHESIS THORAC 31X31X10 (Endovascular Graft) ×2 IMPLANT
ENDOPROTH THORACIC 31X31X10 (Endovascular Graft) ×4 IMPLANT
GAUZE SPONGE 2X2 8PLY STRL LF (GAUZE/BANDAGES/DRESSINGS) ×2 IMPLANT
GLOVE BIOGEL PI IND STRL 6.5 (GLOVE) ×4 IMPLANT
GLOVE BIOGEL PI IND STRL 7.5 (GLOVE) ×4 IMPLANT
GLOVE BIOGEL PI INDICATOR 6.5 (GLOVE) ×4
GLOVE BIOGEL PI INDICATOR 7.5 (GLOVE) ×4
GLOVE SS BIOGEL STRL SZ 7 (GLOVE) ×2 IMPLANT
GLOVE SUPERSENSE BIOGEL SZ 7 (GLOVE) ×2
GLOVE SURG SS PI 7.0 STRL IVOR (GLOVE) ×12 IMPLANT
GLOVE SURG SS PI 7.5 STRL IVOR (GLOVE) ×4 IMPLANT
GOWN STRL REUS W/ TWL LRG LVL3 (GOWN DISPOSABLE) ×2 IMPLANT
GOWN STRL REUS W/ TWL XL LVL3 (GOWN DISPOSABLE) ×6 IMPLANT
GOWN STRL REUS W/TWL LRG LVL3 (GOWN DISPOSABLE) ×2
GOWN STRL REUS W/TWL XL LVL3 (GOWN DISPOSABLE) ×6
GRAFT BALLN CATH 65CM (STENTS) IMPLANT
HEMOSTAT SNOW SURGICEL 2X4 (HEMOSTASIS) IMPLANT
INSERT FOGARTY 61MM (MISCELLANEOUS) IMPLANT
INSERT FOGARTY SM (MISCELLANEOUS) IMPLANT
KIT BASIN OR (CUSTOM PROCEDURE TRAY) ×4 IMPLANT
KIT ROOM TURNOVER OR (KITS) ×4 IMPLANT
LIQUID BAND (GAUZE/BANDAGES/DRESSINGS) ×4 IMPLANT
NEEDLE PERC 18GX7CM (NEEDLE) ×4 IMPLANT
NS IRRIG 1000ML POUR BTL (IV SOLUTION) ×4 IMPLANT
PACK AORTA (CUSTOM PROCEDURE TRAY) ×4 IMPLANT
PAD ARMBOARD 7.5X6 YLW CONV (MISCELLANEOUS) ×8 IMPLANT
PAD DEFIB R2 (MISCELLANEOUS) ×4 IMPLANT
PENCIL BUTTON HOLSTER BLD 10FT (ELECTRODE) IMPLANT
PERCLOSE PROGLIDE 6F (VASCULAR PRODUCTS) ×8
PROBE PENCIL 8 MHZ STRL DISP (MISCELLANEOUS) ×4 IMPLANT
PROTECTION STATION PRESSURIZED (MISCELLANEOUS) ×4
SHEATH AVANTI 11CM 8FR (MISCELLANEOUS) ×8 IMPLANT
SHEATH BRITE TIP 8FR 23CM (MISCELLANEOUS) IMPLANT
SHEATH DRYSEAL FLEX 22FR 33CM (SHEATH) ×2 IMPLANT
SHIELD RADPAD SCOOP 12X17 (MISCELLANEOUS) ×8 IMPLANT
SPONGE GAUZE 2X2 STER 10/PKG (GAUZE/BANDAGES/DRESSINGS) ×2
STATION PROTECTION PRESSURIZED (MISCELLANEOUS) ×2 IMPLANT
STENT GRAFT BALLN CATH 65CM (STENTS)
STOPCOCK MORSE 400PSI 3WAY (MISCELLANEOUS) ×4 IMPLANT
SUT ETHILON 3 0 PS 1 (SUTURE) IMPLANT
SUT MNCRL AB 4-0 PS2 18 (SUTURE) ×4 IMPLANT
SUT PROLENE 5 0 C 1 24 (SUTURE) IMPLANT
SUT VIC AB 2-0 CT1 27 (SUTURE)
SUT VIC AB 2-0 CT1 TAPERPNT 27 (SUTURE) IMPLANT
SUT VIC AB 3-0 SH 27 (SUTURE)
SUT VIC AB 3-0 SH 27X BRD (SUTURE) IMPLANT
SUT VICRYL 4-0 PS2 18IN ABS (SUTURE) ×8 IMPLANT
SYR 20CC LL (SYRINGE) ×8 IMPLANT
SYR 30ML LL (SYRINGE) ×4 IMPLANT
SYR 5ML LL (SYRINGE) IMPLANT
SYR MEDRAD MARK V 150ML (SYRINGE) IMPLANT
SYRINGE 10CC LL (SYRINGE) ×16 IMPLANT
TRAY FOLEY CATH 16FRSI W/METER (SET/KITS/TRAYS/PACK) IMPLANT
TUBING HIGH PRESSURE 120CM (CONNECTOR) ×4 IMPLANT
WIRE AMPLATZ SS-J .035X180CM (WIRE) IMPLANT
WIRE BENTSON .035X145CM (WIRE) ×4 IMPLANT
WIRE STIFF LUNDERQUIST 260CM (WIRE) ×4 IMPLANT

## 2014-07-12 NOTE — Procedures (Signed)
Procedure: Left supraorbital laceration repair  Indication: Left supraorbital laceration  Surgeon: Silvestre Gunner, PA-C  Assist: None  Anesthesia: 42ml lidocaine w/1% epi  EBL: Minimal  Complications: None  Findings: After infiltrating 1ml anesthetic around the supraorbital fossa to achieve a regional block the laceration was scrubbed with betadine then approximated with 5-0 Prolene interrupted sutures. There were no issues.    Lisette Abu, PA-C Pager: [phone] General Trauma PA Pager: [phone]
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
## 2014-07-12 NOTE — ED Notes (Signed)
Blood gas results given to Silvestre Gunner, PA.  Known venous sample.
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
## 2014-07-12 NOTE — ED Notes (Signed)
Lactic acid results given to Dr. Mingo Amber

## 2014-07-12 NOTE — Transfer of Care (Signed)
Immediate Anesthesia Transfer of Care Note  Patient: Travis Carlson  Procedure(s) Performed: Procedure(s): THORACIC AORTIC ENDOVASCULAR STENT GRAFT (N/A)  Patient Location: SICU  Anesthesia Type:General  Level of Consciousness: sedated  Airway & Oxygen Therapy: Patient remains intubated per anesthesia plan and Patient placed on Ventilator (see vital sign flow sheet for setting)  Post-op Assessment: Report given to PACU RN and Post -op Vital signs reviewed and stable  Post vital signs: Reviewed and stable  Complications: No apparent anesthesia complications

## 2014-07-12 NOTE — Anesthesia Postprocedure Evaluation (Signed)
  Anesthesia Post-op Note  Patient: Travis Carlson  Procedure(s) Performed: Procedure(s): THORACIC AORTIC ENDOVASCULAR STENT GRAFT (N/A)  Patient Location: SICU  Anesthesia Type:General  Level of Consciousness: sedated and Patient remains intubated per anesthesia plan  Airway and Oxygen Therapy: Patient remains intubated per anesthesia plan and Patient placed on Ventilator (see vital sign flow sheet for setting)  Post-op Pain: none  Post-op Assessment: Post-op Vital signs reviewed, Patient's Cardiovascular Status Stable, Respiratory Function Stable, Patent Airway and Pain level controlled  Post-op Vital Signs: stable  Last Vitals:  Filed Vitals:   07/12/14 1530  BP: 116/57  Pulse: 71  Temp:   Resp: 20    Complications: No apparent anesthesia complications

## 2014-07-12 NOTE — ED Notes (Signed)
Personal belongings- wallet with ID, glasses, some bills, jeans (cut), one shoe

## 2014-07-12 NOTE — ED Notes (Signed)
Buck's traction with 20 lbs applied by ortho tech
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
## 2014-07-12 NOTE — Progress Notes (Signed)
  Echocardiogram 2D Echocardiogram has been performed.  Travis Carlson 07/12/2014, 4:51 PM

## 2014-07-12 NOTE — Procedures (Signed)
Central Venous Catheter Insertion Procedure Note Travis Carlson [id number] May 07, 1946  Procedure: Insertion of Central Venous Catheter Indications: Assessment of intravascular volume and Drug and/or fluid administration  Procedure Details Consent: Unable to obtain consent because of emergent medical necessity. Time Out: Verified patient identification, verified procedure, site/side was marked, verified correct patient position, special equipment/implants available, medications/allergies/relevent history reviewed, required imaging and test results available.  Performed  Maximum sterile technique was used including antiseptics, cap, gloves, gown, hand hygiene, mask and sheet. Skin prep: Chlorhexidine; local anesthetic administered A antimicrobial bonded/coated triple lumen catheter was placed in the left subclavian vein using the Seldinger technique.  Evaluation Blood flow good Complications: No apparent complications Patient did tolerate procedure well. Chest X-ray ordered to verify placement.  CXR: pending.  Carlson,Travis 07/12/2014, 9:39 AM  [place]  Occult sepsis?  Travis Carlson. Travis Mould, MD, [place] Pgr: [facility]

## 2014-07-12 NOTE — ED Notes (Signed)
Colostomy bag reattached over stoma.
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
## 2014-07-12 NOTE — ED Notes (Signed)
Pt brought to CT with this RN, RT.
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
## 2014-07-12 NOTE — Anesthesia Preprocedure Evaluation (Addendum)
Anesthesia Evaluation  Patient identified by MRN, date of birth, ID band Patient unresponsive    Reviewed: Allergy & Precautions, H&P , Patient's Chart, lab work & pertinent test results, Unable to perform ROS - Chart review onlyPreop documentation limited or incomplete due to emergent nature of procedure.  Airway Mallampati: Intubated     Mouth opening: Limited Mouth Opening  Dental  (+) Poor Dentition   Pulmonary    + decreased breath sounds      Cardiovascular Rhythm:Regular     Neuro/Psych    GI/Hepatic   Endo/Other    Renal/GU      Musculoskeletal   Abdominal   Peds  Hematology   Anesthesia Other Findings   Reproductive/Obstetrics                            Anesthesia Physical Anesthesia Plan  ASA: IV and emergent  Anesthesia Plan: General   Post-op Pain Management:    Induction: Intravenous  Airway Management Planned: Oral ETT  Additional Equipment: Arterial line and CVP  Intra-op Plan:   Post-operative Plan:   Informed Consent: I have reviewed the patients History and Physical, chart, labs and discussed the procedure including the risks, benefits and alternatives for the proposed anesthesia with the patient or authorized representative who has indicated his/her understanding and acceptance.     Plan Discussed with: CRNA and Anesthesiologist  Anesthesia Plan Comments:         Anesthesia Quick Evaluation
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
## 2014-07-12 NOTE — Progress Notes (Signed)
UR completed.  Dak Szumski, RN BSN [facility] CCM Trauma/Neuro ICU Case Manager [phone]  

## 2014-07-12 NOTE — Progress Notes (Signed)
Spoke with daughter and updated her on condition. She does want to be contacted for decision making and updates but says that his neighbor, [name], probably knows more about him and we are permitted to discuss his care with him.    Lisette Abu, PA-C Pager: [phone] General Trauma PA Pager: [phone]
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
## 2014-07-13 ENCOUNTER — Encounter (HOSPITAL_COMMUNITY): Payer: Self-pay | Admitting: Surgery

## 2014-07-13 ENCOUNTER — Inpatient Hospital Stay (HOSPITAL_COMMUNITY): Payer: Medicare PPO

## 2014-07-13 DIAGNOSIS — J9690 Respiratory failure, unspecified, unspecified whether with hypoxia or hypercapnia: Secondary | ICD-10-CM | POA: Diagnosis present

## 2014-07-13 DIAGNOSIS — S2500XA Unspecified injury of thoracic aorta, initial encounter: Secondary | ICD-10-CM | POA: Diagnosis present

## 2014-07-13 DIAGNOSIS — R579 Shock, unspecified: Secondary | ICD-10-CM | POA: Diagnosis present

## 2014-07-13 LAB — GLUCOSE, CAPILLARY
GLUCOSE-CAPILLARY: 105 mg/dL — AB (ref 70–99)
GLUCOSE-CAPILLARY: 111 mg/dL — AB (ref 70–99)
GLUCOSE-CAPILLARY: 115 mg/dL — AB (ref 70–99)
GLUCOSE-CAPILLARY: 135 mg/dL — AB (ref 70–99)
GLUCOSE-CAPILLARY: 142 mg/dL — AB (ref 70–99)
GLUCOSE-CAPILLARY: 99 mg/dL (ref 70–99)
Glucose-Capillary: 142 mg/dL — ABNORMAL HIGH (ref 70–99)
Glucose-Capillary: 112 mg/dL — ABNORMAL HIGH (ref 70–99)
Glucose-Capillary: 109 mg/dL — ABNORMAL HIGH (ref 70–99)
Glucose-Capillary: 112 mg/dL — ABNORMAL HIGH (ref 70–99)
Glucose-Capillary: 99 mg/dL (ref 70–99)
Glucose-Capillary: 112 mg/dL — ABNORMAL HIGH (ref 70–99)
Glucose-Capillary: 116 mg/dL — ABNORMAL HIGH (ref 70–99)
Glucose-Capillary: 125 mg/dL — ABNORMAL HIGH (ref 70–99)
Glucose-Capillary: 129 mg/dL — ABNORMAL HIGH (ref 70–99)
Glucose-Capillary: 137 mg/dL — ABNORMAL HIGH (ref 70–99)
Glucose-Capillary: 125 mg/dL — ABNORMAL HIGH (ref 70–99)
Glucose-Capillary: 102 mg/dL — ABNORMAL HIGH (ref 70–99)
Glucose-Capillary: 114 mg/dL — ABNORMAL HIGH (ref 70–99)
Glucose-Capillary: 150 mg/dL — ABNORMAL HIGH (ref 70–99)

## 2014-07-13 LAB — RENAL FUNCTION PANEL
Albumin: 2.5 g/dL — ABNORMAL LOW (ref 3.5–5.2)
Anion gap: 15 (ref 5–15)
BUN: 37 mg/dL — ABNORMAL HIGH (ref 6–23)
CHLORIDE: 105 meq/L (ref 96–112)
CO2: 17 meq/L — AB (ref 19–32)
CREATININE: 3.42 mg/dL — AB (ref 0.50–1.35)
Calcium: 8 mg/dL — ABNORMAL LOW (ref 8.4–10.5)
GFR, EST AFRICAN AMERICAN: 20 mL/min — AB (ref >90)
GFR, EST NON AFRICAN AMERICAN: 17 mL/min — AB (ref >90)
Glucose, Bld: 123 mg/dL — ABNORMAL HIGH (ref 70–99)
Phosphorus: 2.3 mg/dL (ref 2.3–4.6)
Potassium: 3.9 mEq/L (ref 3.7–5.3)
SODIUM: 137 meq/L (ref 137–147)

## 2014-07-13 LAB — CBC
HCT: 29.1 % — ABNORMAL LOW (ref 39.0–52.0)
Hemoglobin: 9.9 g/dL — ABNORMAL LOW (ref 13.0–17.0)
MCH: 28.6 pg (ref 26.0–34.0)
MCHC: 34 g/dL (ref 30.0–36.0)
MCV: 84.1 fL (ref 78.0–100.0)
PLATELETS: 111 10*3/uL — AB (ref 150–400)
RBC: 3.46 MIL/uL — AB (ref 4.22–5.81)
RDW: 14.9 % (ref 11.5–15.5)
WBC: 10.2 10*3/uL (ref 4.0–10.5)

## 2014-07-13 LAB — POCT I-STAT 3, ART BLOOD GAS (G3+)
Acid-base deficit: 6 mmol/L — ABNORMAL HIGH (ref 0.0–2.0)
Acid-base deficit: 8 mmol/L — ABNORMAL HIGH (ref 0.0–2.0)
BICARBONATE: 17.4 meq/L — AB (ref 20.0–24.0)
Bicarbonate: 17.7 mEq/L — ABNORMAL LOW (ref 20.0–24.0)
O2 SAT: 99 %
O2 Saturation: 95 %
PCO2 ART: 29.9 mmHg — AB (ref 35.0–45.0)
PCO2 ART: 35.6 mmHg (ref 35.0–45.0)
PH ART: 7.309 — AB (ref 7.350–7.450)
PO2 ART: 125 mmHg — AB (ref 80.0–100.0)
Patient temperature: 100.8
Patient temperature: 100.3
TCO2: 18 mmol/L (ref 0–100)
TCO2: 19 mmol/L (ref 0–100)
pH, Arterial: 7.379 (ref 7.350–7.450)
pO2, Arterial: 85 mmHg (ref 80.0–100.0)

## 2014-07-13 LAB — URINE CULTURE: SPECIAL REQUESTS: NORMAL

## 2014-07-13 MED ORDER — INSULIN DETEMIR 100 UNIT/ML ~~LOC~~ SOLN
10.0000 [IU] | Freq: Once | SUBCUTANEOUS | Status: AC
  Administered 2014-07-13: 10 [IU] via SUBCUTANEOUS
  Filled 2014-07-13 (×2): qty 0.1

## 2014-07-13 MED ORDER — PIVOT 1.5 CAL PO LIQD
1000.0000 mL | ORAL | Status: DC
  Administered 2014-07-13 – 2014-07-14 (×2): 1000 mL
  Filled 2014-07-13 (×4): qty 1000

## 2014-07-13 MED ORDER — INSULIN ASPART 100 UNIT/ML ~~LOC~~ SOLN
0.0000 [IU] | SUBCUTANEOUS | Status: DC
  Administered 2014-07-13 (×2): 2 [IU] via SUBCUTANEOUS
  Administered 2014-07-14: 5 [IU] via SUBCUTANEOUS
  Administered 2014-07-14 (×4): 3 [IU] via SUBCUTANEOUS
  Administered 2014-07-15: 8 [IU] via SUBCUTANEOUS
  Administered 2014-07-15: 5 [IU] via SUBCUTANEOUS
  Administered 2014-07-15 (×2): 3 [IU] via SUBCUTANEOUS
  Administered 2014-07-15 (×2): 5 [IU] via SUBCUTANEOUS
  Administered 2014-07-16 – 2014-07-17 (×4): 2 [IU] via SUBCUTANEOUS
  Administered 2014-07-17: 3 [IU] via SUBCUTANEOUS
  Administered 2014-07-17 – 2014-07-18 (×5): 2 [IU] via SUBCUTANEOUS
  Administered 2014-07-18: 3 [IU] via SUBCUTANEOUS
  Administered 2014-07-19: 8 [IU] via SUBCUTANEOUS
  Administered 2014-07-19: 3 [IU] via SUBCUTANEOUS
  Administered 2014-07-19: 5 [IU] via SUBCUTANEOUS
  Administered 2014-07-20 (×2): 3 [IU] via SUBCUTANEOUS
  Administered 2014-07-20 (×2): 2 [IU] via SUBCUTANEOUS
  Administered 2014-07-20 – 2014-07-21 (×2): 3 [IU] via SUBCUTANEOUS
  Administered 2014-07-21: 5 [IU] via SUBCUTANEOUS
  Administered 2014-07-21: 2 [IU] via SUBCUTANEOUS
  Administered 2014-07-21 – 2014-07-22 (×2): 3 [IU] via SUBCUTANEOUS

## 2014-07-13 MED ORDER — INSULIN DETEMIR 100 UNIT/ML ~~LOC~~ SOLN
10.0000 [IU] | Freq: Every day | SUBCUTANEOUS | Status: DC
  Administered 2014-07-13 – 2014-07-21 (×9): 10 [IU] via SUBCUTANEOUS
  Filled 2014-07-13 (×10): qty 0.1

## 2014-07-13 MED ORDER — DOCUSATE SODIUM 50 MG/5ML PO LIQD
100.0000 mg | Freq: Every day | ORAL | Status: DC
  Administered 2014-07-13 – 2014-07-15 (×3): 100 mg via ORAL
  Filled 2014-07-13 (×5): qty 10
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
## 2014-07-13 NOTE — Progress Notes (Addendum)
Follow up - Trauma and Critical Care  Patient Details:    Travis Carlson is an 68 y.o. male.  Lines/tubes : Airway 7 mm (Active)  Secured at (cm) 24 cm 07/13/2014  7:00 AM  Measured From Lips 07/13/2014  7:00 AM  Secured Location Right 07/13/2014  7:00 AM  Secured By [employer] 07/13/2014  7:00 AM  Tube Holder Repositioned Yes 07/13/2014  3:09 AM  Cuff Pressure (cm H2O) 24 cm H2O 07/13/2014  3:09 AM  Site Condition Dry 07/13/2014  7:00 AM     CVC Triple Lumen 07/12/14 Right Subclavian (Active)  Indication for Insertion or Continuance of Line Prolonged intravenous therapies;Limited venous access - need for IV therapy >5 days (PICC only) 07/12/2014  8:00 PM  Site Assessment Clean;Dry;Intact 07/12/2014  8:00 PM  Proximal Lumen Status Infusing;Flushed;Blood return noted;Capped (Central line) 07/12/2014  8:00 PM  Medial Infusing;Flushed;Capped (Central line);Blood return noted 07/12/2014  8:00 PM  Distal Lumen Status Infusing;Flushed;Capped (Central line);Blood return noted 07/12/2014  8:00 PM  Dressing Type Transparent;Occlusive 07/12/2014  8:00 PM  Dressing Status Clean;Dry;Intact 07/12/2014  8:00 PM  Line Care Connections checked and tightened 07/12/2014  8:00 PM  Dressing Change Due 07/19/14 07/12/2014  8:00 PM     NG/OG Tube Orogastric 18 Fr. Right mouth (Active)  Placement Verification Auscultation;Xray 07/13/2014  4:00 AM  Site Assessment Clean;Dry;Intact 07/13/2014  4:00 AM  Status Suction-low intermittent 07/13/2014  4:00 AM  Amount of suction 60 mmHg 07/12/2014  7:12 AM  Drainage Appearance Green 07/13/2014  4:00 AM  Intake (mL) 30 mL 07/13/2014  8:00 AM  Output (mL) 350 mL 07/13/2014  5:00 AM     Ileostomy RLQ (Active)  Ostomy Pouch Intact 07/13/2014  4:00 AM  Stoma Assessment Red 07/13/2014  4:00 AM  Peristomal Assessment Intact 07/13/2014  4:00 AM  Output (mL) 50 mL 07/13/2014  8:00 AM     Urethral Catheter Hayley Ringley, RN/ Dr. Grandville Silos Temperature probe 16 Fr. (Active)   Indication for Insertion or Continuance of Catheter Peri-operative use for selective surgical procedure;Unstable critical patients (first 24-48 hours) 07/13/2014  8:00 AM  Site Assessment Clean;Intact 07/12/2014  8:00 PM  Catheter Maintenance Bag below level of bladder;Catheter secured;Drainage bag/tubing not touching floor;Insertion date on drainage bag;No dependent loops;Seal intact 07/13/2014  8:00 AM  Collection Container Standard drainage bag 07/12/2014  8:00 PM  Securement Method Leg strap 07/12/2014  8:00 PM  Urinary Catheter Interventions Unclamped;Other (comment) 07/12/2014  8:00 PM  Output (mL) 125 mL 07/13/2014  8:00 AM    Microbiology/Sepsis markers: Results for orders placed or performed during the hospital encounter of 07/12/14  MRSA PCR Screening     Status: None   Collection Time: 07/12/14  2:50 PM  Result Value Ref Range Status   MRSA by PCR NEGATIVE NEGATIVE Final    Comment:        The GeneXpert MRSA Assay (FDA approved for NASAL specimens only), is one component of a comprehensive MRSA colonization surveillance program. It is not intended to diagnose MRSA infection nor to guide or monitor treatment for MRSA infections.     Anti-infectives:  Anti-infectives    Start     Dose/Rate Route Frequency Ordered Stop   07/14/14 1430  vancomycin (VANCOCIN) IVPB 1000 mg/200 mL premix     1,000 mg200 mL/hr over 60 Minutes Intravenous Every 48 hours 07/12/14 1411     07/13/14 0800  cefTRIAXone (ROCEPHIN) 1 g in dextrose 5 % 50 mL IVPB - Premix  Status:  Discontinued     1 g100 mL/hr over 30 Minutes Intravenous Every 24 hours 07/12/14 1302 07/12/14 1409   07/12/14 1700  piperacillin-tazobactam (ZOSYN) IVPB 3.375 g     3.375 g12.5 mL/hr over 240 Minutes Intravenous Every 8 hours 07/12/14 0945     07/12/14 1000  vancomycin (VANCOCIN) IVPB 1000 mg/200 mL premix  Status:  Discontinued     1,000 mg200 mL/hr over 60 Minutes Intravenous Every 48 hours 07/12/14 0935 07/12/14 1411    07/12/14 1000  piperacillin-tazobactam (ZOSYN) IVPB 3.375 g  Status:  Discontinued     3.375 g12.5 mL/hr over 240 Minutes Intravenous Every 8 hours 07/12/14 0935 07/12/14 0944   07/12/14 1000  piperacillin-tazobactam (ZOSYN) IVPB 3.375 g  Status:  Discontinued     3.375 g100 mL/hr over 30 Minutes Intravenous  Once 07/12/14 0944 07/12/14 1408   07/12/14 0815  cefTRIAXone (ROCEPHIN) 2 g in dextrose 5 % 50 mL IVPB     2 g100 mL/hr over 30 Minutes Intravenous Every 24 hours 07/12/14 0807     07/12/14 0715  piperacillin-tazobactam (ZOSYN) IVPB 3.375 g  Status:  Discontinued     3.375 g100 mL/hr over 30 Minutes Intravenous  Once 07/12/14 0708 07/12/14 0807   07/12/14 0715  vancomycin (VANCOCIN) IVPB 1000 mg/200 mL premix  Status:  Discontinued     1,000 mg200 mL/hr over 60 Minutes Intravenous  Once 07/12/14 0708 07/12/14 1607      Best Practice/Protocols:  VTE Prophylaxis: Mechanical GI Prophylaxis: Proton Pump Inhibitor Continous Sedation Also on insulin drip  Consults: Treatment Team:  Rozanna Box, MD Alexis Frock, MD Windy Kalata, MD    Events:  Subjective:    Overnight Issues: Has been stable overnight.  Sedated  Objective:  Vital signs for last 24 hours: Temp:  [97.6 F (36.4 C)-100.8 F (38.2 C)] 100.8 F (38.2 C) (12/02 0359) Pulse Rate:  [70-115] 83 (12/02 0600) Resp:  [15-26] 20 (12/02 0600) BP: (67-148)/(30-74) 121/53 mmHg (12/02 0600) SpO2:  [63 %-100 %] 98 % (12/02 0600) Arterial Line BP: (73-192)/(43-84) 124/47 mmHg (12/02 0600) FiO2 (%):  [40 %-100 %] 40 % (12/02 0700) Weight:  [79.379 kg (175 lb)] 79.379 kg (175 lb) (12/01 0832)  Hemodynamic parameters for last 24 hours:    Intake/Output from previous day: 12/01 0701 - 12/02 0700 In: 10853.8 [I.V.:9143.8; Blood:1340; NG/GT:220; IV Piggyback:150] Out: 68 [Urine:2610; Emesis/NG output:350; Stool:450; Blood:50]  Intake/Output this shift: Total I/O In: 353.7 [I.V.:273.7; NG/GT:30; IV  Piggyback:50] Out: 175 [Urine:125; Stool:50]  Vent settings for last 24 hours: Vent Mode:  [-] PRVC FiO2 (%):  [40 %-100 %] 40 % Set Rate:  [20 bmp] 20 bmp Vt Set:  [580 mL] 580 mL PEEP:  [5 cmH20] 5 cmH20 Plateau Pressure:  [16 PXT06-26 cmH20] 16 cmH20  Physical Exam:  General: no respiratory distress and Not responding currently Neuro: RASS -1 Resp: clear to auscultation bilaterally CVS: regular rate and rhythm, S1, S2 normal, no murmur, click, rub or gallop GI: soft, nontender, BS WNL, no r/g and Ileostomy in the RLQ is prolapsed a bit, but viable.  Output for the last shift was 450cc Extremities: RLE in Buck's traction.  LLE has SCD in place.  Results for orders placed or performed during the hospital encounter of 07/12/14 (from the past 24 hour(s))  I-Stat venous blood gas, ED     Status: Abnormal   Collection Time: 07/12/14  8:18 AM  Result Value Ref Range   pH, Ven 7.140 (LL) 7.250 -  7.300   pCO2, Ven 49.7 45.0 - 50.0 mmHg   pO2, Ven 43.0 30.0 - 45.0 mmHg   Bicarbonate 16.4 (L) 20.0 - 24.0 mEq/L   TCO2 18 0 - 100 mmol/L   O2 Saturation 56.0 %   Acid-base deficit 12.0 (H) 0.0 - 2.0 mmol/L   Patient temperature 102.4 F    Collection site BRACHIAL ARTERY    Drawn by Operator    Sample type VENOUS    Comment NOTIFIED PHYSICIAN   Type and screen     Status: None (Preliminary result)   Collection Time: 07/12/14  8:50 AM  Result Value Ref Range   ABO/RH(D) A POS    Antibody Screen NEG    Sample Expiration 07/15/2014    Unit Number [id number]    Blood Component Type RED CELLS,LR    Unit division 00    Status of Unit REL FROM [facility]    Unit tag comment VERBAL ORDERS PER DR YELVERTON    Transfusion Status OK TO TRANSFUSE    Crossmatch Result COMPATIBLE    Unit Number [id number]    Blood Component Type RBC LR PHER2    Unit division 00    Status of Unit REL FROM [facility]    Unit tag comment VERBAL ORDERS PER DR YELVERTON    Transfusion Status OK TO TRANSFUSE     Crossmatch Result COMPATIBLE    Unit Number [id number]    Blood Component Type RED CELLS,LR    Unit division 00    Status of Unit ISSUED,FINAL    Transfusion Status OK TO TRANSFUSE    Crossmatch Result Compatible    Unit Number [id number]    Blood Component Type RED CELLS,LR    Unit division 00    Status of Unit ISSUED,FINAL    Transfusion Status OK TO TRANSFUSE    Crossmatch Result Compatible    Unit Number [id number]    Blood Component Type RED CELLS,LR    Unit division 00    Status of Unit ISSUED,FINAL    Transfusion Status OK TO TRANSFUSE    Crossmatch Result Compatible    Unit Number [id number]    Blood Component Type RBC LR PHER1    Unit division 00    Status of Unit ISSUED,FINAL    Transfusion Status OK TO TRANSFUSE    Crossmatch Result Compatible    Unit Number [id number]    Blood Component Type RED CELLS,LR    Unit division 00    Status of Unit ALLOCATED    Transfusion Status OK TO TRANSFUSE    Crossmatch Result Compatible    Unit Number [id number]    Blood Component Type RED CELLS,LR    Unit division 00    Status of Unit ALLOCATED    Transfusion Status OK TO TRANSFUSE    Crossmatch Result Compatible    Unit Number [id number]    Blood Component Type RED CELLS,LR    Unit division 00    Status of Unit ALLOCATED    Transfusion Status OK TO TRANSFUSE    Crossmatch Result Compatible    Unit Number [id number]    Blood Component Type RED CELLS,LR    Unit division 00    Status of Unit ALLOCATED    Transfusion Status OK TO TRANSFUSE    Crossmatch Result Compatible   Prepare RBC     Status: None   Collection Time: 07/12/14  8:50 AM  Result Value Ref Range   Order Confirmation ORDER PROCESSED BY BLOOD BANK   ABO/Rh  Status: None   Collection Time: 07/12/14  8:50 AM  Result Value Ref Range   ABO/RH(D) A POS   CG4 I-STAT (Lactic acid)     Status: Abnormal   Collection Time: 07/12/14  9:03 AM  Result Value Ref Range   Lactic Acid,  Venous 3.85 (H) 0.5 - 2.2 mmol/L  Lactic acid, plasma     Status: Abnormal   Collection Time: 07/12/14 11:00 AM  Result Value Ref Range   Lactic Acid, Venous 3.0 (H) 0.5 - 2.2 mmol/L  I-STAT 7, (LYTES, BLD GAS, ICA, H+H)     Status: Abnormal   Collection Time: 07/12/14 11:25 AM  Result Value Ref Range   pH, Arterial 7.252 (L) 7.350 - 7.450   pCO2 arterial 38.2 35.0 - 45.0 mmHg   pO2, Arterial 329.0 (H) 80.0 - 100.0 mmHg   Bicarbonate 16.9 (L) 20.0 - 24.0 mEq/L   TCO2 18 0 - 100 mmol/L   O2 Saturation 100.0 %   Acid-base deficit 10.0 (H) 0.0 - 2.0 mmol/L   Sodium 129 (L) 137 - 147 mEq/L   Potassium 5.1 3.7 - 5.3 mEq/L   Calcium, Ion 1.35 (H) 1.13 - 1.30 mmol/L   HCT 23.0 (L) 39.0 - 52.0 %   Hemoglobin 7.8 (L) 13.0 - 17.0 g/dL   Patient temperature 36.9 C    Sample type ARTERIAL   I-STAT 7, (LYTES, BLD GAS, ICA, H+H)     Status: Abnormal   Collection Time: 07/12/14 12:18 PM  Result Value Ref Range   pH, Arterial 7.284 (L) 7.350 - 7.450   pCO2 arterial 34.8 (L) 35.0 - 45.0 mmHg   pO2, Arterial 318.0 (H) 80.0 - 100.0 mmHg   Bicarbonate 16.7 (L) 20.0 - 24.0 mEq/L   TCO2 18 0 - 100 mmol/L   O2 Saturation 100.0 %   Acid-base deficit 9.0 (H) 0.0 - 2.0 mmol/L   Sodium 131 (L) 137 - 147 mEq/L   Potassium 5.5 (H) 3.7 - 5.3 mEq/L   Calcium, Ion 1.18 1.13 - 1.30 mmol/L   HCT 23.0 (L) 39.0 - 52.0 %   Hemoglobin 7.8 (L) 13.0 - 17.0 g/dL   Patient temperature 36.2 C    Sample type ARTERIAL   I-STAT 4, (NA,K, GLUC, HGB,HCT)     Status: Abnormal   Collection Time: 07/12/14 12:23 PM  Result Value Ref Range   Sodium 133 (L) 137 - 147 mEq/L   Potassium 5.5 (H) 3.7 - 5.3 mEq/L   Glucose, Bld 339 (H) 70 - 99 mg/dL   HCT 26.0 (L) 39.0 - 52.0 %   Hemoglobin 8.8 (L) 13.0 - 17.0 g/dL  Basic metabolic panel     Status: Abnormal   Collection Time: 07/12/14  1:15 PM  Result Value Ref Range   Sodium 131 (L) 137 - 147 mEq/L   Potassium 5.6 (H) 3.7 - 5.3 mEq/L   Chloride 100 96 - 112 mEq/L   CO2  18 (L) 19 - 32 mEq/L   Glucose, Bld 327 (H) 70 - 99 mg/dL   BUN 45 (H) 6 - 23 mg/dL   Creatinine, Ser 3.75 (H) 0.50 - 1.35 mg/dL   Calcium 7.9 (L) 8.4 - 10.5 mg/dL   GFR calc non Af Amer 15 (L) >90 mL/min   GFR calc Af Amer 18 (L) >90 mL/min   Anion gap 13 5 - 15  CBC     Status: Abnormal   Collection Time: 07/12/14  1:15 PM  Result Value Ref Range  WBC 11.8 (H) 4.0 - 10.5 K/uL   RBC 3.79 (L) 4.22 - 5.81 MIL/uL   Hemoglobin 10.9 (L) 13.0 - 17.0 g/dL   HCT 32.5 (L) 39.0 - 52.0 %   MCV 85.8 78.0 - 100.0 fL   MCH 28.8 26.0 - 34.0 pg   MCHC 33.5 30.0 - 36.0 g/dL   RDW 14.0 11.5 - 15.5 %   Platelets 116 (L) 150 - 400 K/uL  Protime-INR     Status: Abnormal   Collection Time: 07/12/14  1:15 PM  Result Value Ref Range   Prothrombin Time 17.7 (H) 11.6 - 15.2 seconds   INR 1.44 0.00 - 1.49  Triglycerides     Status: Abnormal   Collection Time: 07/12/14  1:15 PM  Result Value Ref Range   Triglycerides 191 (H) <150 mg/dL  APTT     Status: None   Collection Time: 07/12/14  1:15 PM  Result Value Ref Range   aPTT 35 24 - 37 seconds  Magnesium     Status: Abnormal   Collection Time: 07/12/14  1:15 PM  Result Value Ref Range   Magnesium 1.4 (L) 1.5 - 2.5 mg/dL  I-STAT 3, arterial blood gas (G3+)     Status: Abnormal   Collection Time: 07/12/14  1:22 PM  Result Value Ref Range   pH, Arterial 7.304 (L) 7.350 - 7.450   pCO2 arterial 37.0 35.0 - 45.0 mmHg   pO2, Arterial 190.0 (H) 80.0 - 100.0 mmHg   Bicarbonate 18.5 (L) 20.0 - 24.0 mEq/L   TCO2 20 0 - 100 mmol/L   O2 Saturation 100.0 %   Acid-base deficit 7.0 (H) 0.0 - 2.0 mmol/L   Patient temperature 97.6 F    Sample type ARTERIAL   Glucose, capillary     Status: Abnormal   Collection Time: 07/12/14  1:27 PM  Result Value Ref Range   Glucose-Capillary 319 (H) 70 - 99 mg/dL  Glucose, capillary     Status: Abnormal   Collection Time: 07/12/14  2:38 PM  Result Value Ref Range   Glucose-Capillary 287 (H) 70 - 99 mg/dL  MRSA PCR  Screening     Status: None   Collection Time: 07/12/14  2:50 PM  Result Value Ref Range   MRSA by PCR NEGATIVE NEGATIVE  Glucose, capillary     Status: Abnormal   Collection Time: 07/12/14  3:50 PM  Result Value Ref Range   Glucose-Capillary 250 (H) 70 - 99 mg/dL   Comment 1 Capillary Sample   Glucose, capillary     Status: Abnormal   Collection Time: 07/12/14  5:14 PM  Result Value Ref Range   Glucose-Capillary 201 (H) 70 - 99 mg/dL  Glucose, capillary     Status: Abnormal   Collection Time: 07/12/14  6:22 PM  Result Value Ref Range   Glucose-Capillary 142 (H) 70 - 99 mg/dL  Lactic acid, plasma     Status: None   Collection Time: 07/12/14  6:30 PM  Result Value Ref Range   Lactic Acid, Venous 2.0 0.5 - 2.2 mmol/L  Glucose, capillary     Status: Abnormal   Collection Time: 07/12/14  7:35 PM  Result Value Ref Range   Glucose-Capillary 112 (H) 70 - 99 mg/dL  Glucose, capillary     Status: Abnormal   Collection Time: 07/12/14  8:45 PM  Result Value Ref Range   Glucose-Capillary 109 (H) 70 - 99 mg/dL   Comment 1 Arterial Sample   Glucose, capillary  Status: Abnormal   Collection Time: 07/12/14  9:51 PM  Result Value Ref Range   Glucose-Capillary 112 (H) 70 - 99 mg/dL  Basic metabolic panel     Status: Abnormal   Collection Time: 07/12/14 10:39 PM  Result Value Ref Range   Sodium 135 (L) 137 - 147 mEq/L   Potassium 4.0 3.7 - 5.3 mEq/L   Chloride 104 96 - 112 mEq/L   CO2 17 (L) 19 - 32 mEq/L   Glucose, Bld 107 (H) 70 - 99 mg/dL   BUN 40 (H) 6 - 23 mg/dL   Creatinine, Ser 3.50 (H) 0.50 - 1.35 mg/dL   Calcium 8.3 (L) 8.4 - 10.5 mg/dL   GFR calc non Af Amer 16 (L) >90 mL/min   GFR calc Af Amer 19 (L) >90 mL/min   Anion gap 14 5 - 15  I-STAT 3, arterial blood gas (G3+)     Status: Abnormal   Collection Time: 07/13/14  4:04 AM  Result Value Ref Range   pH, Arterial 7.379 7.350 - 7.450   pCO2 arterial 29.9 (L) 35.0 - 45.0 mmHg   pO2, Arterial 125.0 (H) 80.0 - 100.0 mmHg    Bicarbonate 17.4 (L) 20.0 - 24.0 mEq/L   TCO2 18 0 - 100 mmol/L   O2 Saturation 99.0 %   Acid-base deficit 6.0 (H) 0.0 - 2.0 mmol/L   Patient temperature 100.8 F    Collection site ARTERIAL LINE    Drawn by Nurse    Sample type ARTERIAL   Renal function panel     Status: Abnormal   Collection Time: 07/13/14  5:26 AM  Result Value Ref Range   Sodium 137 137 - 147 mEq/L   Potassium 3.9 3.7 - 5.3 mEq/L   Chloride 105 96 - 112 mEq/L   CO2 17 (L) 19 - 32 mEq/L   Glucose, Bld 123 (H) 70 - 99 mg/dL   BUN 37 (H) 6 - 23 mg/dL   Creatinine, Ser 3.42 (H) 0.50 - 1.35 mg/dL   Calcium 8.0 (L) 8.4 - 10.5 mg/dL   Phosphorus 2.3 2.3 - 4.6 mg/dL   Albumin 2.5 (L) 3.5 - 5.2 g/dL   GFR calc non Af Amer 17 (L) >90 mL/min   GFR calc Af Amer 20 (L) >90 mL/min   Anion gap 15 5 - 15  CBC     Status: Abnormal   Collection Time: 07/13/14  5:26 AM  Result Value Ref Range   WBC 10.2 4.0 - 10.5 K/uL   RBC 3.46 (L) 4.22 - 5.81 MIL/uL   Hemoglobin 9.9 (L) 13.0 - 17.0 g/dL   HCT 29.1 (L) 39.0 - 52.0 %   MCV 84.1 78.0 - 100.0 fL   MCH 28.6 26.0 - 34.0 pg   MCHC 34.0 30.0 - 36.0 g/dL   RDW 14.9 11.5 - 15.5 %   Platelets 111 (L) 150 - 400 K/uL     Assessment/Plan:   NEURO  Altered Mental Status:  sedation   Plan: Keep sedated for now.  PULM  Metabolic acidosis with compensatory respiratory alkalosis normalizing pH   Plan: Try to improve volume status, decrease respiratory rate.  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  Global Issues  Patient is doing better than expected.  Aortic transection has been taken care of.  Still needs right hip fixed.  Doing okay on the ventilator.  Have not seen any family.  C-spine can be cleared if the patient can awaken and follow commands and has no neck pain or tenderness with negative CT of the S-spine.  Renal failure is stable and trending downward. No need for dialysis or percutaneous nephrostomy tube at this point.    LOS: 1 day   Additional comments:I reviewed the patient's new clinical lab test results. cbc/bmet and I reviewed the patients new imaging test results. cxr  Critical Care Total Time*: 38 minutes.  Jaliel Deavers, JAY 07/13/2014  *Care during the described time interval was provided by me and/or other providers on the critical care team.  I have reviewed this patient's available data, including medical history, events of note, physical examination and test results as part of my evaluation.   C-spine cleared based on normal CT C-spine, no neck pain or tenderness, no neurological deficits per EAST guidelines for C-spine clearance in the obtunded patient.

## 2014-07-13 NOTE — Progress Notes (Addendum)
[facility]  Subjective  - intubated response to verbal ques with motion of upper extremities.   Objective 121/53 83 100.8 F (38.2 C) (Oral) 20 98%  Intake/Output Summary (Last 24 hours) at 07/13/14 0715 Last data filed at 07/13/14 0600  Gross per 24 hour  Intake 10829.39 ml  Output   3460 ml  Net 7369.39 ml    Left groin soft without hematoma Palpable left PT pulse  Assessment/Planning: POD #TVAR  Will plan f/u CTA in the future per Dr. Fae Pippin, [facility] 07/13/2014 7:15 AM --  Laboratory Lab Results:  Recent Labs  07/12/14 1315 07/13/14 0526  WBC 11.8* 10.2  HGB 10.9* 9.9*  HCT 32.5* 29.1*  PLT 116* 111*   BMET  Recent Labs  07/12/14 2239 07/13/14 0526  NA 135* 137  K 4.0 3.9  CL 104 105  CO2 17* 17*  GLUCOSE 107* 123*  BUN 40* 37*  CREATININE 3.50* 3.42*  CALCIUM 8.3* 8.0*    COAG Lab Results  Component Value Date   INR 1.44 07/12/2014   INR 1.20 07/12/2014   No results found for: PTT    I agree with the above.  The patient is postoperative day one from endovascular repair of an aortic transection.  Both extremities are warm and well perfused with Doppler signals.  The left groin is soft.  He is moving his left extremity and toes.  The right extremity is injured.  He remains intubated.  He remained stable from a vascular perspective.  Because of his infectious issues, I would recommend a long course of antibiotics given the fact that he has a new endovascular stent.  Annamarie Major

## 2014-07-13 NOTE — Progress Notes (Signed)
[facility] ([facility]  Subjective: 1 Day Post-Op Procedure(s) (LRB): THORACIC AORTIC ENDOVASCULAR STENT GRAFT (N/A) Intubated, off pressor support.  Objective: Current Vitals Blood pressure 113/49, pulse 80, temperature 100.6 F (38.1 C), temperature source Axillary, resp. rate 14, height 5\' 10"  (1.778 m), weight 175 lb (79.379 kg), SpO2 98 %. Vital signs in last 24 hours: Temp:  [97.6 F (36.4 C)-100.8 F (38.2 C)] 100.6 F (38.1 C) (12/02 1200) Pulse Rate:  [70-83] 80 (12/02 1200) Resp:  [13-20] 14 (12/02 1200) BP: (97-148)/(45-66) 113/49 mmHg (12/02 1200) SpO2:  [97 %-100 %] 98 % (12/02 1200) Arterial Line BP: (73-192)/(43-84) 126/46 mmHg (12/02 1200) FiO2 (%):  [40 %-100 %] 40 % (12/02 1200)  Intake/Output from previous day: 12/01 0701 - 12/02 0700 In: 10853.8 [I.V.:9143.8; Blood:1340; NG/GT:220; IV Piggyback:150] Out: 1950 [Urine:2610; Emesis/NG output:350; Stool:450; Blood:50]  LABS  Recent Labs  07/12/14 1125 07/12/14 1218 07/12/14 1223 07/12/14 1315 07/13/14 0526  HGB 7.8* 7.8* 8.8* 10.9* 9.9*    Recent Labs  07/12/14 1315 07/13/14 0526  WBC 11.8* 10.2  RBC 3.79* 3.46*  HCT 32.5* 29.1*  PLT 116* 111*    Recent Labs  07/12/14 2239 07/13/14 0526  NA 135* 137  K 4.0 3.9  CL 104 105  CO2 17* 17*  BUN 40* 37*  CREATININE 3.50* 3.42*  GLUCOSE 107* 123*  CALCIUM 8.3* 8.0*    Recent Labs  07/12/14 0649 07/12/14 1315  INR 1.20 1.44    Physical Exam  Intubated and lightly sedated but follows all commands perfectly. RLE Bucks's traction  Right sided ostomy bag  Global edema  No ecchymosis  Motor EHL, ext, flex, evers 5/5  DP 2+    Assessment/Plan: 1 Day Post-Op Procedure(s) (LRB): THORACIC AORTIC ENDOVASCULAR STENT GRAFT (N/A)  Comminuted right femoral neck fracture with extension into peritrochanteric region  Urosepsis  Right sided colostomy  Aortic transection  Limited clinical information; ? H/o cancer?  Complex  situation both medically and technically  Options include repair with elevated risk of failure and potential loss of limited bone stock in peritroch region if subsequent arthroplasty required; hemiarthroplasty; or total hip arthroplasty given relatively young age  Infection control and resolution clearly of highest priority. Aortic repair stable.  I have spoken with Dr. Frederik Pear given his clinical expertise in complex THA, and he has agreed to see the patient in consultation tomorrow for further recommendations and possible treatment.  Altamese [place], MD [facility] [phone] (p)   07/13/2014, 12:58 PM

## 2014-07-13 NOTE — Progress Notes (Signed)
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 and MIPs were obtained and reviewed to evaluate the vascular anatomy.  CONTRAST:  162mL OMNIPAQUE IOHEXOL 350 MG/ML SOLN  COMPARISON:  noncontrast study from earlier the same day.  FINDINGS: CTA CHEST FINDINGS  Transsection of the proximal descending thoracic aorta with associated short segment dissection flap. There is a surrounding periaortic hematoma extending up to the distal arch and anterior mediastinum, and extending distally nearly to the level of the diaphragm. Aortic arch appears intact. Classic 3 vessel brachiocephalic arterial origin anatomy without proximal stenosis. Ascending aorta unremarkable. The more distal descending thoracic aorta shows mild atheromatous change without propagation of any dissection flap or stenosis.  No pericardial effusion. Small bilateral pleural effusions. Dependent atelectasis posteriorly in both lower lobes. Subcentimeter prevascular, precarinal lymph nodes. No hilar adenopathy. No pneumothorax. Mild emphysematous changes in the lung apices. Consolidation/ atelectasis in the left infrahilar region. Thoracic spine and sternum intact. Multiple bilateral anterior rib fractures, minimally displaced.  Review of the MIP images confirms the above findings.  CTA ABDOMEN AND PELVIS FINDINGS  Arterial findings:  Aorta: Mild  atheromatous irregularity with scattered plaque predominately in the infrarenal segment. No aneurysm, dissection, or stenosis.  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 There are 3 right renal arteries. The superior is diminutive, supplying a segment of the upper pole. The middle is diminutive, supplying a portion of the posterior division. The inferior is aberrant, the dominant supply to the remainder of the renal parenchyma, arising from the proximal LEFT common iliac artery .  Inferior mesenteric: Origin occlusion extending over a long segment .  Left iliac: Mild atheromatous plaque. No aneurysm, dissection, or stenosis.  Note: Inferior RIGHT renal artery arises from the proximal LEFT common iliac artery.  Right iliac: Scattered atheromatous plaque without dissection, aneurysm, or stenosis.  Venous findings:     Venous phase imaging not obtained  Review of the MIP images confirms the above findings.  Nonvascular findings: Gas in decompressed left hepatic biliary tree suggesting patent sphincterotomy. Previous cholecystectomy. Otherwise unremarkable arterial phase evaluation of the liver. Unremarkable spleen and pancreas. Mild bilateral nodular adrenal enlargement. 4.7 cm partially exophytic cyst, lower pole left kidney. Mild inflammatory/ edematous changes around the right kidney. 17 mm upper pole right renal calculus. No hydronephrosis.  Previous colectomy with right lower quadrant ostomy. Stomach and small bowel are nondilated. Nasogastric tube into the stomach.  Subcentimeter left para-aortic, paracaval, portacaval, and aortocaval lymph nodes.  Comminuted right intertrochanteric femur fracture.  Foley catheter decompresses the urinary bladder which appears markedly thick walled.  No ascites. No free air. Spondylitic changes in the lower lumbar spine.  IMPRESSION:  1. Proximal descending thoracic aortic transsection with surrounding para-aortic hematoma. 2. Multiple bilateral rib fractures, minimally displaced without pneumothorax. 3. Small bilateral pleural effusions. 4. No significant abdominal aortic or iliac arterial occlusive disease or dissection. 5. 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 Critical Value/emergent results were called by telephone at the time of interpretation on 07/12/2014 at 10:57 am to Dr. Trula Slade, who verbally acknowledged these results. 6. Comminuted right intertrochanteric femur fracture.   Electronically Signed   By: Arne Cleveland M.D.   On: 07/12/2014 11:00   BMET    Component Value Date/Time   NA 137 07/13/2014 0526   K 3.9 07/13/2014 0526   CL 105 07/13/2014 0526   CO2 17* 07/13/2014 0526   GLUCOSE 123* 07/13/2014 0526   BUN 37* 07/13/2014 0526   CREATININE 3.42* 07/13/2014 0526   CALCIUM 8.0* 07/13/2014 0526   GFRNONAA 17* 07/13/2014 0526   GFRAA 20* 07/13/2014 0526   CBC    Component Value Date/Time   WBC 10.2 07/13/2014 0526   RBC 3.46* 07/13/2014 0526   HGB 9.9* 07/13/2014 0526   HCT 29.1* 07/13/2014 0526   PLT 111* 07/13/2014 0526   MCV 84.1 07/13/2014 0526   MCH 28.6 07/13/2014 0526   MCHC 34.0 07/13/2014 0526   RDW 14.9 07/13/2014 0526   LYMPHSABS 2.8 07/12/2014 0736   MONOABS 3.0* 07/12/2014 0736   EOSABS 0.0 07/12/2014 0736   BASOSABS 0.0 07/12/2014 0736     Assessment: 1.  Acute on CKD vs CKD, UO good and Scr down which is encouraging though still at high risk for worsening renal fx 2. 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 Mild hyperkalemia, improved  Plan: 1. Cont with supportive therapy. 2. Will need Rt hydro addressed at some point along with TURP for bladder mass 3. Recheck labs in AM.  Rajiv Parlato T

## 2014-07-13 NOTE — Progress Notes (Signed)
Waste approximately 20 mL of propofol with Everlene Other, RN.
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
## 2014-07-14 ENCOUNTER — Encounter (HOSPITAL_COMMUNITY): Admission: EM | Disposition: A | Discharge status: Skilled Nursing Facility | Payer: Self-pay | Source: Home / Self Care

## 2014-07-14 ENCOUNTER — Encounter (HOSPITAL_COMMUNITY): Payer: Self-pay | Admitting: Certified Registered"

## 2014-07-14 ENCOUNTER — Inpatient Hospital Stay (HOSPITAL_COMMUNITY): Payer: Medicare PPO | Admitting: Anesthesiology

## 2014-07-14 ENCOUNTER — Inpatient Hospital Stay (HOSPITAL_COMMUNITY): Payer: Medicare PPO

## 2014-07-14 HISTORY — PX: HIP ARTHROPLASTY: SHX981

## 2014-07-14 LAB — CBC WITH DIFFERENTIAL/PLATELET
BASOS PCT: 0 % (ref 0–1)
Basophils Absolute: 0 10*3/uL (ref 0.0–0.1)
EOS ABS: 0.3 10*3/uL (ref 0.0–0.7)
EOS PCT: 3 % (ref 0–5)
HCT: 26.5 % — ABNORMAL LOW (ref 39.0–52.0)
Hemoglobin: 8.5 g/dL — ABNORMAL LOW (ref 13.0–17.0)
Lymphocytes Relative: 11 % — ABNORMAL LOW (ref 12–46)
Lymphs Abs: 1 10*3/uL (ref 0.7–4.0)
MCH: 27.4 pg (ref 26.0–34.0)
MCHC: 32.1 g/dL (ref 30.0–36.0)
MCV: 85.5 fL (ref 78.0–100.0)
Monocytes Absolute: 1.4 10*3/uL — ABNORMAL HIGH (ref 0.1–1.0)
Monocytes Relative: 15 % — ABNORMAL HIGH (ref 3–12)
Neutro Abs: 6.9 10*3/uL (ref 1.7–7.7)
Neutrophils Relative %: 71 % (ref 43–77)
Platelets: 121 10*3/uL — ABNORMAL LOW (ref 150–400)
RBC: 3.1 MIL/uL — ABNORMAL LOW (ref 4.22–5.81)
RDW: 15.4 % (ref 11.5–15.5)
WBC: 9.7 10*3/uL (ref 4.0–10.5)

## 2014-07-14 LAB — GLUCOSE, CAPILLARY
GLUCOSE-CAPILLARY: 186 mg/dL — AB (ref 70–99)
GLUCOSE-CAPILLARY: 203 mg/dL — AB (ref 70–99)
Glucose-Capillary: 167 mg/dL — ABNORMAL HIGH (ref 70–99)
Glucose-Capillary: 168 mg/dL — ABNORMAL HIGH (ref 70–99)
Glucose-Capillary: 183 mg/dL — ABNORMAL HIGH (ref 70–99)
Glucose-Capillary: 231 mg/dL — ABNORMAL HIGH (ref 70–99)

## 2014-07-14 LAB — COMPREHENSIVE METABOLIC PANEL
ALT: 22 U/L (ref 0–53)
AST: 39 U/L — AB (ref 0–37)
Albumin: 2.2 g/dL — ABNORMAL LOW (ref 3.5–5.2)
Alkaline Phosphatase: 62 U/L (ref 39–117)
Anion gap: 13 (ref 5–15)
BILIRUBIN TOTAL: 0.5 mg/dL (ref 0.3–1.2)
BUN: 29 mg/dL — ABNORMAL HIGH (ref 6–23)
CO2: 16 meq/L — AB (ref 19–32)
CREATININE: 3.21 mg/dL — AB (ref 0.50–1.35)
Calcium: 7.5 mg/dL — ABNORMAL LOW (ref 8.4–10.5)
Chloride: 109 mEq/L (ref 96–112)
GFR, EST AFRICAN AMERICAN: 21 mL/min — AB (ref >90)
GFR, EST NON AFRICAN AMERICAN: 18 mL/min — AB (ref >90)
GLUCOSE: 196 mg/dL — AB (ref 70–99)
Potassium: 4.1 mEq/L (ref 3.7–5.3)
Sodium: 138 mEq/L (ref 137–147)
Total Protein: 5.3 g/dL — ABNORMAL LOW (ref 6.0–8.3)

## 2014-07-14 LAB — POCT I-STAT 7, (LYTES, BLD GAS, ICA,H+H)
Acid-base deficit: 8 mmol/L — ABNORMAL HIGH (ref 0.0–2.0)
Acid-base deficit: 8 mmol/L — ABNORMAL HIGH (ref 0.0–2.0)
BICARBONATE: 17.3 meq/L — AB (ref 20.0–24.0)
Bicarbonate: 17.6 mEq/L — ABNORMAL LOW (ref 20.0–24.0)
Calcium, Ion: 1.14 mmol/L (ref 1.13–1.30)
Calcium, Ion: 1.13 mmol/L (ref 1.13–1.30)
HCT: 27 % — ABNORMAL LOW (ref 39.0–52.0)
HEMATOCRIT: 27 % — AB (ref 39.0–52.0)
Hemoglobin: 9.2 g/dL — ABNORMAL LOW (ref 13.0–17.0)
Hemoglobin: 9.2 g/dL — ABNORMAL LOW (ref 13.0–17.0)
O2 SAT: 98 %
O2 Saturation: 99 %
PO2 ART: 128 mmHg — AB (ref 80.0–100.0)
POTASSIUM: 4.1 meq/L (ref 3.7–5.3)
Patient temperature: 36.9
Patient temperature: 36.7
Potassium: 4.2 mEq/L (ref 3.7–5.3)
SODIUM: 140 meq/L (ref 137–147)
Sodium: 140 mEq/L (ref 137–147)
TCO2: 19 mmol/L (ref 0–100)
TCO2: 18 mmol/L (ref 0–100)
pCO2 arterial: 35.3 mmHg (ref 35.0–45.0)
pCO2 arterial: 34.1 mmHg — ABNORMAL LOW (ref 35.0–45.0)
pH, Arterial: 7.305 — ABNORMAL LOW (ref 7.350–7.450)
pH, Arterial: 7.312 — ABNORMAL LOW (ref 7.350–7.450)
pO2, Arterial: 123 mmHg — ABNORMAL HIGH (ref 80.0–100.0)

## 2014-07-14 LAB — POCT I-STAT 3, ART BLOOD GAS (G3+)
Acid-base deficit: 8 mmol/L — ABNORMAL HIGH (ref 0.0–2.0)
Bicarbonate: 18.1 mEq/L — ABNORMAL LOW (ref 20.0–24.0)
O2 Saturation: 96 %
TCO2: 19 mmol/L (ref 0–100)
pCO2 arterial: 37.8 mmHg (ref 35.0–45.0)
pH, Arterial: 7.288 — ABNORMAL LOW (ref 7.350–7.450)
pO2, Arterial: 91 mmHg (ref 80.0–100.0)

## 2014-07-14 LAB — PREPARE RBC (CROSSMATCH)

## 2014-07-14 SURGERY — HEMIARTHROPLASTY, HIP, DIRECT ANTERIOR APPROACH, FOR FRACTURE
Anesthesia: General | Laterality: Right

## 2014-07-14 MED ORDER — MIDAZOLAM HCL 5 MG/5ML IJ SOLN
INTRAMUSCULAR | Status: DC | PRN
  Administered 2014-07-14: 2 mg via INTRAVENOUS

## 2014-07-14 MED ORDER — VECURONIUM BROMIDE 10 MG IV SOLR
INTRAVENOUS | Status: AC
  Filled 2014-07-14: qty 10

## 2014-07-14 MED ORDER — VECURONIUM BROMIDE 10 MG IV SOLR
INTRAVENOUS | Status: DC | PRN
  Administered 2014-07-14: 2 mg via INTRAVENOUS
  Administered 2014-07-14: 7 mg via INTRAVENOUS
  Administered 2014-07-14: 1 mg via INTRAVENOUS
  Administered 2014-07-14 (×2): 2 mg via INTRAVENOUS

## 2014-07-14 MED ORDER — SODIUM CHLORIDE 0.9 % IR SOLN
Status: DC | PRN
  Administered 2014-07-14: 3000 mL

## 2014-07-14 MED ORDER — CITRIC ACID-SODIUM CITRATE 334-500 MG/5ML PO SOLN
30.0000 mL | Freq: Three times a day (TID) | ORAL | Status: DC
  Administered 2014-07-14 – 2014-07-16 (×7): 30 mL
  Filled 2014-07-14 (×12): qty 30

## 2014-07-14 MED ORDER — SODIUM CHLORIDE 0.9 % IV SOLN
INTRAVENOUS | Status: DC | PRN
  Administered 2014-07-14: 11:00:00 via INTRAVENOUS

## 2014-07-14 MED ORDER — SODIUM CHLORIDE 0.9 % IV SOLN
Freq: Once | INTRAVENOUS | Status: AC
  Administered 2014-07-14: 15:00:00 via INTRAVENOUS

## 2014-07-14 MED ORDER — LACTATED RINGERS IV SOLN
INTRAVENOUS | Status: DC | PRN
  Administered 2014-07-14: 10:00:00 via INTRAVENOUS

## 2014-07-14 MED ORDER — ACETAMINOPHEN 325 MG PO TABS
325.0000 mg | ORAL_TABLET | ORAL | Status: DC | PRN
  Filled 2014-07-14: qty 2

## 2014-07-14 MED ORDER — MIDAZOLAM HCL 2 MG/2ML IJ SOLN
INTRAMUSCULAR | Status: AC
  Filled 2014-07-14: qty 2

## 2014-07-14 MED ORDER — PIVOT 1.5 CAL PO LIQD
1000.0000 mL | ORAL | Status: DC
  Filled 2014-07-14 (×4): qty 1000

## 2014-07-14 MED ORDER — ACETAMINOPHEN 650 MG RE SUPP: 325.0000 mg | RECTAL | Status: DC | PRN

## 2014-07-14 MED ORDER — FENTANYL CITRATE 0.05 MG/ML IJ SOLN
INTRAMUSCULAR | Status: DC | PRN
  Administered 2014-07-14: 100 ug via INTRAVENOUS
  Administered 2014-07-14 (×3): 50 ug via INTRAVENOUS

## 2014-07-14 MED ORDER — STERILE WATER FOR INJECTION IJ SOLN
INTRAMUSCULAR | Status: AC
  Filled 2014-07-14: qty 10

## 2014-07-14 MED ORDER — FLUCONAZOLE IN SODIUM CHLORIDE 200-0.9 MG/100ML-% IV SOLN
200.0000 mg | INTRAVENOUS | Status: DC
  Administered 2014-07-14: 200 mg via INTRAVENOUS
  Filled 2014-07-14: qty 100

## 2014-07-14 MED ORDER — ALBUMIN HUMAN 5 % IV SOLN
INTRAVENOUS | Status: DC | PRN
  Administered 2014-07-14: 12:00:00 via INTRAVENOUS

## 2014-07-14 MED ORDER — FLUCONAZOLE 100MG IVPB
100.0000 mg | INTRAVENOUS | Status: AC
  Administered 2014-07-15 – 2014-07-18 (×4): 100 mg via INTRAVENOUS
  Filled 2014-07-14 (×6): qty 50

## 2014-07-14 MED ORDER — PROPOFOL 10 MG/ML IV BOLUS
INTRAVENOUS | Status: AC
  Filled 2014-07-14: qty 20

## 2014-07-14 MED ORDER — PIPERACILLIN-TAZOBACTAM IN DEX 2-0.25 GM/50ML IV SOLN
2.2500 g | Freq: Three times a day (TID) | INTRAVENOUS | Status: DC
  Administered 2014-07-14 – 2014-07-15 (×2): 2.25 g via INTRAVENOUS
  Filled 2014-07-14 (×4): qty 50

## 2014-07-14 MED ORDER — FENTANYL CITRATE 0.05 MG/ML IJ SOLN
INTRAMUSCULAR | Status: AC
  Filled 2014-07-14: qty 5

## 2014-07-14 MED ORDER — ACETAMINOPHEN 160 MG/5ML PO SOLN
650.0000 mg | Freq: Four times a day (QID) | ORAL | Status: DC | PRN
  Administered 2014-07-14 (×2): 650 mg via ORAL
  Filled 2014-07-14 (×3): qty 20.3

## 2014-07-14 MED ORDER — 0.9 % SODIUM CHLORIDE (POUR BTL) OPTIME
TOPICAL | Status: DC | PRN
  Administered 2014-07-14: 1000 mL

## 2014-07-14 SURGICAL SUPPLY — 68 items
BLADE SAW SAG 73X25 THK (BLADE) ×2
BLADE SAW SGTL 73X25 THK (BLADE) ×1 IMPLANT
BRUSH FEMORAL CANAL (MISCELLANEOUS) IMPLANT
BRUSH SCRUB DISP (MISCELLANEOUS) ×6 IMPLANT
CAPT HIP HEMI 2 ×3 IMPLANT
CEMENT BONE DEPUY (Cement) IMPLANT
COVER BACK TABLE 24X17X13 BIG (DRAPES) IMPLANT
COVER SURGICAL LIGHT HANDLE (MISCELLANEOUS) ×6 IMPLANT
DRAPE C-ARMOR (DRAPES) ×3 IMPLANT
DRAPE IMP U-DRAPE 54X76 (DRAPES) ×3 IMPLANT
DRAPE INCISE IOBAN 85X60 (DRAPES) ×6 IMPLANT
DRAPE ORTHO SPLIT 77X108 STRL (DRAPES) ×4
DRAPE SURG ORHT 6 SPLT 77X108 (DRAPES) ×2 IMPLANT
DRAPE U-SHAPE 47X51 STRL (DRAPES) ×3 IMPLANT
DRILL BIT 7/64X5 (BIT) ×3 IMPLANT
DRSG ADAPTIC 3X8 NADH LF (GAUZE/BANDAGES/DRESSINGS) ×3 IMPLANT
DRSG MEPILEX BORDER 4X12 (GAUZE/BANDAGES/DRESSINGS) ×3 IMPLANT
DRSG PAD ABDOMINAL 8X10 ST (GAUZE/BANDAGES/DRESSINGS) ×3 IMPLANT
ELECT BLADE 4.0 EZ CLEAN MEGAD (MISCELLANEOUS) ×3
ELECT BLADE 6.5 EXT (BLADE) IMPLANT
ELECT CAUTERY BLADE 6.4 (BLADE) ×3 IMPLANT
ELECT REM PT RETURN 9FT ADLT (ELECTROSURGICAL) ×3
ELECTRODE BLDE 4.0 EZ CLN MEGD (MISCELLANEOUS) ×1 IMPLANT
ELECTRODE REM PT RTRN 9FT ADLT (ELECTROSURGICAL) ×1 IMPLANT
EVACUATOR 1/8 PVC DRAIN (DRAIN) IMPLANT
GAUZE SPONGE 4X4 12PLY STRL (GAUZE/BANDAGES/DRESSINGS) ×3 IMPLANT
GLOVE BIO SURGEON STRL SZ7.5 (GLOVE) ×3 IMPLANT
GLOVE BIO SURGEON STRL SZ8 (GLOVE) ×3 IMPLANT
GLOVE BIOGEL PI IND STRL 7.5 (GLOVE) ×1 IMPLANT
GLOVE BIOGEL PI IND STRL 8 (GLOVE) ×1 IMPLANT
GLOVE BIOGEL PI INDICATOR 7.5 (GLOVE) ×2
GLOVE BIOGEL PI INDICATOR 8 (GLOVE) ×2
GOWN STRL REUS W/ TWL LRG LVL3 (GOWN DISPOSABLE) ×2 IMPLANT
GOWN STRL REUS W/ TWL XL LVL3 (GOWN DISPOSABLE) ×1 IMPLANT
GOWN STRL REUS W/TWL 2XL LVL3 (GOWN DISPOSABLE) IMPLANT
GOWN STRL REUS W/TWL LRG LVL3 (GOWN DISPOSABLE) ×4
GOWN STRL REUS W/TWL XL LVL3 (GOWN DISPOSABLE) ×2
HANDPIECE INTERPULSE COAX TIP (DISPOSABLE)
IMMOBILIZER KNEE 20 (SOFTGOODS) IMPLANT
IMMOBILIZER KNEE 22 UNIV (SOFTGOODS) IMPLANT
IMMOBILIZER KNEE 24 THIGH 36 (MISCELLANEOUS) IMPLANT
IMMOBILIZER KNEE 24 UNIV (MISCELLANEOUS)
KIT BASIN OR (CUSTOM PROCEDURE TRAY) ×3 IMPLANT
KIT ROOM TURNOVER OR (KITS) ×3 IMPLANT
MANIFOLD NEPTUNE II (INSTRUMENTS) ×3 IMPLANT
NEEDLE 1/2 CIR MAYO (NEEDLE) IMPLANT
NS IRRIG 1000ML POUR BTL (IV SOLUTION) ×3 IMPLANT
PACK TOTAL JOINT (CUSTOM PROCEDURE TRAY) ×3 IMPLANT
PACK UNIVERSAL I (CUSTOM PROCEDURE TRAY) ×3 IMPLANT
PAD ARMBOARD 7.5X6 YLW CONV (MISCELLANEOUS) ×6 IMPLANT
PASSER SUT SWANSON 36MM LOOP (INSTRUMENTS) IMPLANT
PILLOW ABDUCTION HIP (SOFTGOODS) ×3 IMPLANT
PRESSURIZER FEMORAL UNIV (MISCELLANEOUS) IMPLANT
SET HNDPC FAN SPRY TIP SCT (DISPOSABLE) IMPLANT
SPONGE LAP 18X18 X RAY DECT (DISPOSABLE) ×3 IMPLANT
STAPLER VISISTAT 35W (STAPLE) ×3 IMPLANT
SUCTION FRAZIER TIP 10 FR DISP (SUCTIONS) ×3 IMPLANT
SUT FIBERWIRE #2 38 T-5 BLUE (SUTURE) ×6
SUT VIC AB 1 CT1 27 (SUTURE) ×4
SUT VIC AB 1 CT1 27XBRD ANBCTR (SUTURE) ×2 IMPLANT
SUT VIC AB 2-0 CT1 27 (SUTURE) ×4
SUT VIC AB 2-0 CT1 TAPERPNT 27 (SUTURE) ×2 IMPLANT
SUTURE FIBERWR #2 38 T-5 BLUE (SUTURE) ×2 IMPLANT
TOWEL OR 17X24 6PK STRL BLUE (TOWEL DISPOSABLE) ×3 IMPLANT
TOWEL OR 17X26 10 PK STRL BLUE (TOWEL DISPOSABLE) ×6 IMPLANT
TOWER CARTRIDGE SMART MIX (DISPOSABLE) IMPLANT
TRAY FOLEY CATH 16FRSI W/METER (SET/KITS/TRAYS/PACK) IMPLANT
WATER STERILE IRR 1000ML POUR (IV SOLUTION) ×9 IMPLANT

## 2014-07-14 NOTE — Anesthesia Postprocedure Evaluation (Signed)
  Anesthesia Post-op Note  Patient: Travis Carlson  Procedure(s) Performed: Procedure(s): ARTHROPLASTY BIPOLAR HIP (Right)  Patient Location: PACU  Anesthesia Type: General   Level of Consciousness: awake, alert  and oriented  Airway and Oxygen Therapy: Patient Spontanous Breathing  Post-op Pain: mild  Post-op Assessment: Post-op Vital signs reviewed  Post-op Vital Signs: Reviewed  Last Vitals:  Filed Vitals:   07/14/14 1800  BP: 128/58  Pulse: 71  Temp:   Resp: 16    Complications: No apparent anesthesia complications
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
## 2014-07-14 NOTE — Brief Op Note (Signed)
07/12/2014 - 07/14/2014  1:28 PM  PATIENT:  Travis Carlson  68 y.o. male  PRE-OPERATIVE DIAGNOSIS:  right hip femoral neck fracture  POST-OPERATIVE DIAGNOSIS:  right hip femoral neck fracture  PROCEDURE:  Procedure(s): 1. HEMIARTHROPLASTY RIGHT HIP with [employer], #7 femur, standard neck, 77mm unipolar head  (Right) 2. ORIF OF PROXIMAL FEMUR, GREATER TROCHANTER WITH AUTOGRAFTING  SURGEON:  Surgeon(s) and Role:    * Rozanna Box, MD - Primary  PHYSICIAN ASSISTANT: Ainsley Spinner, PA-C  ANESTHESIA:   general  I/O:  Total I/O In: 2167.3 [I.V.:649.8; Blood:1005; NG/GT:50; IV Piggyback:462.5] Out: 1400 [Urine:550; Stool:50; Blood:800]  SPECIMEN:  No Specimen  TOURNIQUET:  * No tourniquets in log *  DICTATION: .Other Dictation: Dictation Number [id number]

## 2014-07-14 NOTE — Progress Notes (Signed)
Follow up - Trauma and Critical Care  Patient Details:    Travis Carlson is an 68 y.o. male.  Lines/tubes : Airway 7 mm (Active)  Secured at (cm) 24 cm 07/14/2014  4:00 AM  Measured From Lips 07/14/2014  4:00 AM  Secured Location Center 07/14/2014  4:00 AM  Secured By [employer] 07/14/2014  4:00 AM  Tube Holder Repositioned Yes 07/14/2014  3:40 AM  Cuff Pressure (cm H2O) 26 cm H2O 07/14/2014  3:40 AM  Site Condition Dry 07/14/2014  4:00 AM     CVC Triple Lumen 07/12/14 Right Subclavian (Active)  Indication for Insertion or Continuance of Line Prolonged intravenous therapies 07/13/2014  8:00 PM  Site Assessment Clean;Dry;Intact 07/13/2014  8:00 PM  Proximal Lumen Status Infusing;Flushed;Capped (Central line);Blood return noted 07/13/2014  8:00 PM  Medial Infusing;Capped (Central line) 07/13/2014  8:00 PM  Distal Lumen Status Infusing 07/13/2014  8:00 PM  Dressing Type Transparent;Occlusive 07/13/2014  8:00 PM  Dressing Status Clean;Dry;Intact 07/13/2014  8:00 PM  Line Care Connections checked and tightened 07/13/2014  8:00 PM  Dressing Change Due 07/19/14 07/13/2014  8:00 PM     Arterial Line 07/12/14 Left Radial (Active)  Site Assessment Clean;Dry;Intact 07/13/2014  8:00 PM  Line Status Pulsatile blood flow 07/13/2014  8:00 PM  Art Line Waveform Appropriate 07/13/2014  8:00 PM  Art Line Interventions Zeroed and calibrated;Leveled;Connections checked and tightened;Flushed per protocol 07/13/2014  8:00 PM  Color/Movement/Sensation Capillary refill less than 3 sec 07/13/2014  8:00 PM  Dressing Type Transparent;Occlusive 07/13/2014  8:00 PM  Dressing Status Clean;Dry;Intact 07/13/2014  8:00 PM  Dressing Change Due 07/19/14 07/13/2014  8:00 PM     NG/OG Tube Orogastric 18 Fr. Right mouth (Active)  Placement Verification Auscultation 07/14/2014  4:00 AM  Site Assessment Clean;Dry;Intact 07/14/2014  4:00 AM  Status Infusing tube feed;Irrigated 07/14/2014  4:00 AM  Amount of suction 60 mmHg 07/12/2014   7:12 AM  Drainage Appearance Bile 07/13/2014  8:00 AM  Gastric Residual 125 mL 07/14/2014  4:00 AM  Intake (mL) 50 mL 07/14/2014  6:00 AM  Output (mL) 50 mL 07/13/2014 12:00 PM     Ileostomy RLQ (Active)  Ostomy Pouch Intact 07/14/2014  4:00 AM  Stoma Assessment Red 07/14/2014  4:00 AM  Peristomal Assessment Intact 07/14/2014  4:00 AM  Output (mL) 100 mL 07/14/2014  4:00 AM     Urethral Catheter Hayley Ringley, RN/ Dr. Grandville Silos Temperature probe 16 Fr. (Active)  Indication for Insertion or Continuance of Catheter Unstable critical patients (first 24-48 hours) 07/14/2014  7:35 AM  Site Assessment Clean;Intact 07/13/2014  8:00 PM  Catheter Maintenance Bag below level of bladder;Catheter secured;Drainage bag/tubing not touching floor;Insertion date on drainage bag;No dependent loops;Seal intact 07/14/2014  7:36 AM  Collection Container Standard drainage bag 07/13/2014  8:00 PM  Securement Method Leg strap 07/13/2014  8:00 PM  Urinary Catheter Interventions Other (comment) 07/13/2014  8:00 PM  Output (mL) 150 mL 07/14/2014  6:00 AM    Microbiology/Sepsis markers: Results for orders placed or performed during the hospital encounter of 07/12/14  Blood culture (routine x 2)     Status: None (Preliminary result)   Collection Time: 07/12/14  7:00 AM  Result Value Ref Range Status   Specimen Description BLOOD LEFT SHOULDER  Final   Special Requests BOTTLES DRAWN AEROBIC AND ANAEROBIC 10MLS  Final   Culture  Setup Time   Final    07/12/2014 13:29 Performed at [facility]  Final           BLOOD CULTURE RECEIVED NO GROWTH TO DATE CULTURE WILL BE HELD FOR 5 DAYS BEFORE ISSUING A FINAL NEGATIVE REPORT Performed at [facility]    Report Status PENDING  Incomplete  Blood culture (routine x 2)     Status: None (Preliminary result)   Collection Time: 07/12/14  7:23 AM  Result Value Ref Range Status   Specimen Description BLOOD WRIST LEFT  Final   Special Requests BOTTLES DRAWN  AEROBIC AND ANAEROBIC 5ML  Final   Culture  Setup Time   Final    07/12/2014 13:29 Performed at [facility]    Culture   Final           BLOOD CULTURE RECEIVED NO GROWTH TO DATE CULTURE WILL BE HELD FOR 5 DAYS BEFORE ISSUING A FINAL NEGATIVE REPORT Performed at [facility]    Report Status PENDING  Incomplete  Urine culture     Status: None   Collection Time: 07/12/14  7:26 AM  Result Value Ref Range Status   Specimen Description URINE, CATHETERIZED  Final   Special Requests Normal  Final   Culture  Setup Time   Final    07/12/2014 13:31 Performed at [place]   Final    15,000 COLONIES/ML Performed at [place] Performed at [facility]   Final   Report Status 07/13/2014 FINAL  Final  MRSA PCR Screening     Status: None   Collection Time: 07/12/14  2:50 PM  Result Value Ref Range Status   MRSA by PCR NEGATIVE NEGATIVE Final    Comment:        The GeneXpert MRSA Assay (FDA approved for NASAL specimens only), is one component of a comprehensive MRSA colonization surveillance program. It is not intended to diagnose MRSA infection nor to guide or monitor treatment for MRSA infections.     Anti-infectives:  Anti-infectives    Start     Dose/Rate Route Frequency Ordered Stop   07/14/14 1430  vancomycin (VANCOCIN) IVPB 1000 mg/200 mL premix     1,000 mg200 mL/hr over 60 Minutes Intravenous Every 48 hours 07/12/14 1411     07/13/14 0800  cefTRIAXone (ROCEPHIN) 1 g in dextrose 5 % 50 mL IVPB - Premix  Status:  Discontinued     1 g100 mL/hr over 30 Minutes Intravenous Every 24 hours 07/12/14 1302 07/12/14 1409   07/12/14 1700  piperacillin-tazobactam (ZOSYN) IVPB 3.375 g     3.375 g12.5 mL/hr over 240 Minutes Intravenous Every 8 hours 07/12/14 0945     07/12/14 1000  vancomycin (VANCOCIN) IVPB 1000 mg/200 mL premix  Status:  Discontinued     1,000 mg200 mL/hr over 60 Minutes Intravenous Every  48 hours 07/12/14 0935 07/12/14 1411   07/12/14 1000  piperacillin-tazobactam (ZOSYN) IVPB 3.375 g  Status:  Discontinued     3.375 g12.5 mL/hr over 240 Minutes Intravenous Every 8 hours 07/12/14 0935 07/12/14 0944   07/12/14 1000  piperacillin-tazobactam (ZOSYN) IVPB 3.375 g  Status:  Discontinued     3.375 g100 mL/hr over 30 Minutes Intravenous  Once 07/12/14 0944 07/12/14 1408   07/12/14 0815  cefTRIAXone (ROCEPHIN) 2 g in dextrose 5 % 50 mL IVPB  Status:  Discontinued     2 g100 mL/hr over 30 Minutes Intravenous Every 24 hours 07/12/14 0807 07/13/14 1012   07/12/14 0715  piperacillin-tazobactam (ZOSYN)  IVPB 3.375 g  Status:  Discontinued     3.375 g100 mL/hr over 30 Minutes Intravenous  Once 07/12/14 0708 07/12/14 0807   07/12/14 0715  vancomycin (VANCOCIN) IVPB 1000 mg/200 mL premix  Status:  Discontinued     1,000 mg200 mL/hr over 60 Minutes Intravenous  Once 07/12/14 0708 07/12/14 2355      Best Practice/Protocols:  VTE Prophylaxis: Mechanical GI Prophylaxis: Proton Pump Inhibitor Continous Sedation  Consults: Treatment Team:  Rozanna Box, MD Alexis Frock, MD Windy Kalata, MD Kerin Salen, MD    Events:  Subjective:    Overnight Issues: Patient has been stable throughout the night  Objective:  Vital signs for last 24 hours: Temp:  [98.9 F (37.2 C)-101.6 F (38.7 C)] 98.9 F (37.2 C) (12/03 0752) Pulse Rate:  [74-100] 74 (12/03 0700) Resp:  [8-22] 17 (12/03 0700) BP: (106-140)/(45-60) 112/51 mmHg (12/03 0600) SpO2:  [96 %-99 %] 98 % (12/03 0700) Arterial Line BP: (88-149)/(40-61) 117/45 mmHg (12/03 0700) FiO2 (%):  [40 %] 40 % (12/03 0600)  Hemodynamic parameters for last 24 hours: CVP:  [8 mmHg-15 mmHg] 13 mmHg  Intake/Output from previous day: 12/02 0701 - 12/03 0700 In: 6047 [I.V.:5254.7; NG/GT:592.3; IV Piggyback:200] Out: 2295 [Urine:1995; Emesis/NG output:150; Stool:150]  Intake/Output this shift:    Vent settings for last 24  hours: Vent Mode:  [-] PRVC FiO2 (%):  [40 %] 40 % Set Rate:  [15 bmp] 15 bmp Vt Set:  [580 mL] 580 mL PEEP:  [5 cmH20] 5 cmH20 Plateau Pressure:  [12 cmH20-15 cmH20] 15 cmH20  Physical Exam:  General: no respiratory distress and still on sedation Neuro: nonfocal exam and RASS 0 Resp: clear to auscultation bilaterally GI: soft, nontender, BS WNL, no r/g and tolerating tube feedings well. Extremities: right leg in Buck's traction  Results for orders placed or performed during the hospital encounter of 07/12/14 (from the past 24 hour(s))  Glucose, capillary     Status: Abnormal   Collection Time: 07/13/14  9:05 AM  Result Value Ref Range   Glucose-Capillary 102 (H) 70 - 99 mg/dL   Comment 1 Arterial Sample   Glucose, capillary     Status: Abnormal   Collection Time: 07/13/14 10:09 AM  Result Value Ref Range   Glucose-Capillary 111 (H) 70 - 99 mg/dL   Comment 1 Arterial Sample   Glucose, capillary     Status: Abnormal   Collection Time: 07/13/14 11:06 AM  Result Value Ref Range   Glucose-Capillary 114 (H) 70 - 99 mg/dL   Comment 1 Arterial Sample   I-STAT 3, arterial blood gas (G3+)     Status: Abnormal   Collection Time: 07/13/14 11:34 AM  Result Value Ref Range   pH, Arterial 7.309 (L) 7.350 - 7.450   pCO2 arterial 35.6 35.0 - 45.0 mmHg   pO2, Arterial 85.0 80.0 - 100.0 mmHg   Bicarbonate 17.7 (L) 20.0 - 24.0 mEq/L   TCO2 19 0 - 100 mmol/L   O2 Saturation 95.0 %   Acid-base deficit 8.0 (H) 0.0 - 2.0 mmol/L   Patient temperature 100.3 F    Collection site ARTERIAL LINE    Sample type ARTERIAL   Glucose, capillary     Status: Abnormal   Collection Time: 07/13/14 12:09 PM  Result Value Ref Range   Glucose-Capillary 115 (H) 70 - 99 mg/dL   Comment 1 Arterial Sample   Glucose, capillary     Status: Abnormal   Collection Time: 07/13/14  3:16 PM  Result Value Ref Range   Glucose-Capillary 150 (H) 70 - 99 mg/dL   Comment 1 Arterial Sample   Glucose, capillary     Status:  Abnormal   Collection Time: 07/13/14  7:20 PM  Result Value Ref Range   Glucose-Capillary 142 (H) 70 - 99 mg/dL   Comment 1 Capillary Sample    Comment 2 Documented in Chart    Comment 3 Notify RN   Glucose, capillary     Status: Abnormal   Collection Time: 07/13/14 11:54 PM  Result Value Ref Range   Glucose-Capillary 167 (H) 70 - 99 mg/dL   Comment 1 Capillary Sample    Comment 2 Documented in Chart    Comment 3 Notify RN   Glucose, capillary     Status: Abnormal   Collection Time: 07/14/14  3:39 AM  Result Value Ref Range   Glucose-Capillary 186 (H) 70 - 99 mg/dL   Comment 1 Capillary Sample    Comment 2 Documented in Chart    Comment 3 Notify RN   Comprehensive metabolic panel     Status: Abnormal   Collection Time: 07/14/14  4:30 AM  Result Value Ref Range   Sodium 138 137 - 147 mEq/L   Potassium 4.1 3.7 - 5.3 mEq/L   Chloride 109 96 - 112 mEq/L   CO2 16 (L) 19 - 32 mEq/L   Glucose, Bld 196 (H) 70 - 99 mg/dL   BUN 29 (H) 6 - 23 mg/dL   Creatinine, Ser 3.21 (H) 0.50 - 1.35 mg/dL   Calcium 7.5 (L) 8.4 - 10.5 mg/dL   Total Protein 5.3 (L) 6.0 - 8.3 g/dL   Albumin 2.2 (L) 3.5 - 5.2 g/dL   AST 39 (H) 0 - 37 U/L   ALT 22 0 - 53 U/L   Alkaline Phosphatase 62 39 - 117 U/L   Total Bilirubin 0.5 0.3 - 1.2 mg/dL   GFR calc non Af Amer 18 (L) >90 mL/min   GFR calc Af Amer 21 (L) >90 mL/min   Anion gap 13 5 - 15  CBC with Differential     Status: Abnormal   Collection Time: 07/14/14  4:30 AM  Result Value Ref Range   WBC 9.7 4.0 - 10.5 K/uL   RBC 3.10 (L) 4.22 - 5.81 MIL/uL   Hemoglobin 8.5 (L) 13.0 - 17.0 g/dL   HCT 26.5 (L) 39.0 - 52.0 %   MCV 85.5 78.0 - 100.0 fL   MCH 27.4 26.0 - 34.0 pg   MCHC 32.1 30.0 - 36.0 g/dL   RDW 15.4 11.5 - 15.5 %   Platelets 121 (L) 150 - 400 K/uL   Neutrophils Relative % 71 43 - 77 %   Neutro Abs 6.9 1.7 - 7.7 K/uL   Lymphocytes Relative 11 (L) 12 - 46 %   Lymphs Abs 1.0 0.7 - 4.0 K/uL   Monocytes Relative 15 (H) 3 - 12 %   Monocytes  Absolute 1.4 (H) 0.1 - 1.0 K/uL   Eosinophils Relative 3 0 - 5 %   Eosinophils Absolute 0.3 0.0 - 0.7 K/uL   Basophils Relative 0 0 - 1 %   Basophils Absolute 0.0 0.0 - 0.1 K/uL     Assessment/Plan:   NEURO  Altered Mental Status:  sedation   Plan: Keep sedation until plans for surgery are confirmed.  PULM  Atelectasis/collapse (focal and bibasilar)   Plan: Continue ventilation  CARDIO  No issues   Plan: CPM  RENAL  Oliguria (low effective intravascular volume) Metabolic Acidosis (due to bicarbonate loss) Chronic Renal Insufficiency   Plan: Renal involved.    GI  Ileostomy functioning well.   Plan: increase tube feedings  ID  No known infectious sources   Plan: CPM  HEME  Anemia acute blood loss anemia and anemia of critical illness)   Plan: No blood for now but may need some available for surgey whenever that is planned.  ENDO No known issues   Plan: CPM  Global Issues  Patient is stable and could have surgery at anytime for his right hip.  What should be done for his bladder tumor and obstruction is questionable.    LOS: 2 days   Additional comments:I reviewed the patient's new clinical lab test results. cbc/bet and I reviewed the patients new imaging test results. cxr  Critical Care Total Time*: 32 minutes  Furman Trentman, JAY 07/14/2014  *Care during the described time interval was provided by me and/or other providers on the critical care team.  I have reviewed this patient's available data, including medical history, events of note, physical examination and test results as part of my evaluation.
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
## 2014-07-14 NOTE — Progress Notes (Signed)
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 No pelvic lymphadenopathy or distant disease by axial imaging this admission.  2 - Bilateral Nephrolithiasis - Rt 2cm upper pole, Lt small scattered non-obstructing stones incidetnal on trauma CT 07/12/2014. No ureteral stones.   3 - Renal Failure - Cr 4.7 on intake trauma labs. Unknown medical renal history. Very mild rt hydro to bladder w/o obstructing stones by CT. GFR improving with conservative measures despite recent contrast load.   4 - Fevers, Bacteruria - Intermittent fevers on arrival to 101. UCX with yeast only, BCX negative / pending.   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 Per report next of kin daughter estranged   Today Mikki Santee is seen in f/u above. Sedation being weaned but still only able to respond in limited fashion. Still febrile.   Objective: Vital signs in last 24 hours: Temp:  [98.4 F (36.9 C)-101.6 F (38.7 C)] 100.9 F (38.3 C) (12/03 1949) Pulse Rate:  [70-97] 97 (12/03 2006) Resp:  [7-26] 26 (12/03 2006) BP: (106-149)/(45-63) 143/63 mmHg (12/03 2006) SpO2:  [95 %-99 %] 97 % (12/03 2006) Arterial Line BP: (88-195)/(40-139) 90/82 mmHg (12/03 1800) FiO2 (%):  [40 %] 40 % (12/03 2006)    Intake/Output from previous day: 12/02 0701 - 12/03 0700 In: 6047 [I.V.:5254.7; NG/GT:592.3; IV Piggyback:200] Out: 2295 [Urine:1995; Emesis/NG output:150; Stool:150] Intake/Output this shift: Total I/O In: -  Out: 250 [Urine:250]  General appearance: sedated, ill-appearing in ICU. Intubated. Open eyes only with stimulation.  Throat: lips, mucosa, and tongue normal; teeth and gums normal and ETT in place Back: symmetric, no  curvature. ROM normal. No CVA tenderness. Resp: on ventilator Chest wall: no tenderness Cardio: Nl rate byt bedside monitor GI: soft, non-tender; bowel sounds normal; no masses,  no organomegaly and end enterostomy wtih liquid stool in appliance RLQ Male genitalia: normal Extremities: extremities normal, atraumatic, no cyanosis or edema Pulses: 2+ and symmetric Skin: Skin color, texture, turgor normal. No rashes or lesions Lymph nodes: Cervical, supraclavicular, and axillary nodes normal. Neurologic: Mental status: GCS 3T  Lab Results:   Recent Labs  07/13/14 0526 07/14/14 0430 07/14/14 1135 07/14/14 1241  WBC 10.2 9.7  --   --   HGB 9.9* 8.5* 9.2* 9.2*  HCT 29.1* 26.5* 27.0* 27.0*  PLT 111* 121*  --   --    BMET  Recent Labs  07/13/14 0526 07/14/14 0430 07/14/14 1135 07/14/14 1241  NA 137 138 140 140  K 3.9 4.1 4.1 4.2  CL 105 109  --   --   CO2 17* 16*  --   --   GLUCOSE 123* 196*  --   --   BUN 37* 29*  --   --   CREATININE 3.42* 3.21*  --   --   CALCIUM 8.0* 7.5*  --   --    PT/INR  Recent Labs  07/12/14 0649 07/12/14 1315  LABPROT 15.3* 17.7*  INR 1.20 1.44   ABG  Recent Labs  07/14/14 1135 07/14/14 1241  PHART 7.305* 7.312*  HCO3 17.6* 17.3*    Studies/Results: Dg Hip Complete Right  07/14/2014   CLINICAL DATA:  Post RIGHT total hip replacement, femoral neck fracture  EXAM: RIGHT HIP -  COMPLETE 2+ VIEW  COMPARISON:  Portable exam 1505 hr compared to intraoperative images of 07/14/2014  FINDINGS: RIGHT hip prosthesis identified in expected position.  No definite fracture, dislocation or bone destruction.  Greater trochanter is less well profiled than on the preceding intraoperative images but demonstrates fracture as noted on preoperative images.  No additional focal bony abnormalities identified.  IMPRESSION: Post RIGHT hip arthroplasty.  Greater trochanteric fracture fragment again noted.   Electronically Signed   By: Lavonia Dana M.D.   On:  07/14/2014 15:26   Dg Hip Operative Right  07/14/2014   CLINICAL DATA:  Postop  EXAM: DG OPERATIVE right HIP 1-2 VIEWS  TECHNIQUE: Fluoroscopic spot image(s) were submitted for interpretation post-operatively.  COMPARISON:  07/12/2014  FINDINGS: Right hip hemiarthroplasty is in place. Anatomic alignment of the osseous and prostatic structures. No breakage or loosening of the hardware.  IMPRESSION: Right hip hemiarthroplasty anatomically aligned.   Electronically Signed   By: Maryclare Bean M.D.   On: 07/14/2014 14:09   Dg Chest Port 1 View  07/14/2014   CLINICAL DATA:  Post aortic endovascular stent graft  EXAM: PORTABLE CHEST - 1 VIEW  COMPARISON:  Portable exam 0620 hr compared to 07/13/2014  FINDINGS: Tip of endotracheal tube tube projects 4.6 cm above carina.  Nasogastric tube extends into stomach.  RIGHT subclavian central venous catheter tip projects over cavoatrial junction.  Aortic stent graft noted.  Enlargement of cardiac silhouette with pulmonary vascular congestion.  Minimal perihilar edema.  Atelectasis versus consolidation in both lower lobes greater on LEFT.  No pneumothorax.  IMPRESSION: Persistent bibasilar atelectasis versus consolidation much greater in LEFT lower lobe.  Minimal perihilar edema.  Pulmonary opacities have increased since previous exam.   Electronically Signed   By: Lavonia Dana M.D.   On: 07/14/2014 08:02   Dg Chest Port 1 View  07/13/2014   CLINICAL DATA:  Status post thoracic aortic stent graft placement  EXAM: PORTABLE CHEST - 1 VIEW  COMPARISON:  Portable chest x-ray of July 12, 2014  FINDINGS: The patient is rotated on the current exam. The mediastinum is normal in width. The proximal descending thoracic aortic stent graft is unchanged in positioning. The cardiac silhouette is normal in size. The retrocardiac region remains dense and the left hemidiaphragm remains obscured. The pulmonary interstitial markings in both lungs have improved.  The endotracheal tube tip lies  5.1 cm above the crotch of the carina. The esophagogastric tube tip and proximal port project below the level of the GE junction. The right subclavian venous catheter tip projects over the lower third of the SVC.  IMPRESSION: 1. Improving appearance of the pulmonary interstitium consistent with resolving interstitial edema. Left lower lobe atelectasis and a small pleural effusion persists. 2. The mediastinum is normal in width. 3. The support tubes and lines are in reasonable position.   Electronically Signed   By: David  Martinique   On: 07/13/2014 07:35    Anti-infectives: Anti-infectives    Start     Dose/Rate Route Frequency Ordered Stop   07/14/14 1700  piperacillin-tazobactam (ZOSYN) IVPB 2.25 g     2.25 g100 mL/hr over 30 Minutes Intravenous Every 8 hours 07/14/14 1529     07/14/14 1430  vancomycin (VANCOCIN) IVPB 1000 mg/200 mL premix     1,000 mg200 mL/hr over 60 Minutes Intravenous Every 48 hours 07/12/14 1411     07/14/14 0900  fluconazole (DIFLUCAN) IVPB 200 mg     200 mg100 mL/hr over 60 Minutes Intravenous  Every 24 hours 07/14/14 0844     07/13/14 0800  cefTRIAXone (ROCEPHIN) 1 g in dextrose 5 % 50 mL IVPB - Premix  Status:  Discontinued     1 g100 mL/hr over 30 Minutes Intravenous Every 24 hours 07/12/14 1302 07/12/14 1409   07/12/14 1700  piperacillin-tazobactam (ZOSYN) IVPB 3.375 g  Status:  Discontinued     3.375 g12.5 mL/hr over 240 Minutes Intravenous Every 8 hours 07/12/14 0945 07/14/14 1529   07/12/14 1000  vancomycin (VANCOCIN) IVPB 1000 mg/200 mL premix  Status:  Discontinued     1,000 mg200 mL/hr over 60 Minutes Intravenous Every 48 hours 07/12/14 0935 07/12/14 1411   07/12/14 1000  piperacillin-tazobactam (ZOSYN) IVPB 3.375 g  Status:  Discontinued     3.375 g12.5 mL/hr over 240 Minutes Intravenous Every 8 hours 07/12/14 0935 07/12/14 0944   07/12/14 1000  piperacillin-tazobactam (ZOSYN) IVPB 3.375 g  Status:  Discontinued     3.375 g100 mL/hr over 30 Minutes Intravenous   Once 07/12/14 0944 07/12/14 1408   07/12/14 0815  cefTRIAXone (ROCEPHIN) 2 g in dextrose 5 % 50 mL IVPB  Status:  Discontinued     2 g100 mL/hr over 30 Minutes Intravenous Every 24 hours 07/12/14 0807 07/13/14 1012   07/12/14 0715  piperacillin-tazobactam (ZOSYN) IVPB 3.375 g  Status:  Discontinued     3.375 g100 mL/hr over 30 Minutes Intravenous  Once 07/12/14 0708 07/12/14 0807   07/12/14 0715  vancomycin (VANCOCIN) IVPB 1000 mg/200 mL premix  Status:  Discontinued     1,000 mg200 mL/hr over 60 Minutes Intravenous  Once 07/12/14 0708 07/12/14 3790      Assessment/Plan:  1 - 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   2 - Bilateral Nephrolithiasis - non-obstructing, observe.  3 - Renal Failure - Improving. Again likely multifactorial. Suspect partial rt obstruction minimal component given small hydro by imaging.   4 - Fevers, Bacteruria - RX'd 5 days antifungal IV as no other sources for fever clearly identified. Again, would rec Rt perc nephrosotmy if no improvement on antifungals.   Will follow   Reinhard Schack 07/14/2014

## 2014-07-14 NOTE — Progress Notes (Addendum)
[facility] [place]  Subjective  - Intubated and sedated.   Objective 112/51 74 100.1 F (37.8 C) (Oral) 17 98%  Intake/Output Summary (Last 24 hours) at 07/14/14 6387 Last data filed at 07/14/14 0700  Gross per 24 hour  Intake   6047 ml  Output   2295 ml  Net   3752 ml    Left groin soft Palpable PT feet warm well perfused Spontaneous movement of all extremities   Assessment/Planning: POD #TVAR Stable from a vascular point of view. Dr. Trula Slade has recommended long term antibiotics.  Currently on Vancomycin and Renata Caprice [facility] 07/14/2014 7:28 AM --  Laboratory Lab Results:  Recent Labs  07/13/14 0526 07/14/14 0430  WBC 10.2 9.7  HGB 9.9* 8.5*  HCT 29.1* 26.5*  PLT 111* 121*   BMET  Recent Labs  07/13/14 0526 07/14/14 0430  NA 137 138  K 3.9 4.1  CL 105 109  CO2 17* 16*  GLUCOSE 123* 196*  BUN 37* 29*  CREATININE 3.42* 3.21*  CALCIUM 8.0* 7.5*    COAG Lab Results  Component Value Date   INR 1.44 07/12/2014   INR 1.20 07/12/2014   No results found for: PTT    Postop day 2, status post endovascular repair of aortic transection.  Patient is going for orthopedic procedures today. Continue IV antibiotics  Wells Mycah Formica
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
## 2014-07-14 NOTE — Consult Note (Signed)
PHARMACY NOTE  CONSULT :  Renal Adjustment of Diflucan INDICATION :  Fever with no other identifiable sources.  UCX noted to have yeast.  ASSESSMENT:  Pharmacy consulted for Renal adjustment of Diflucan..     Currently receiving Diflucan 200 mg IV q 24 hours. .  Dosing Weight  79 kg,  SCr 3.21,  UOP 1.11ml/kg/hr, estimated CrCl  22.4 ml/min  Currently ordered dose requires slight adjustment based on current renal function.  Results for orders placed or performed during the hospital encounter of 07/12/14  Blood culture (routine x 2)     Status: None (Preliminary result)   Collection Time: 07/12/14  7:00 AM  Result Value Ref Range Status   Specimen Description BLOOD LEFT SHOULDER  Final   Special Requests BOTTLES DRAWN AEROBIC AND ANAEROBIC 10MLS  Final   Culture  Setup Time   Final    07/12/2014 13:29 Performed at [facility]    Culture   Final           BLOOD CULTURE RECEIVED NO GROWTH TO DATE CULTURE WILL BE HELD FOR 5 DAYS BEFORE ISSUING A FINAL NEGATIVE REPORT Performed at [facility]    Report Status PENDING  Incomplete  Blood culture (routine x 2)     Status: None (Preliminary result)   Collection Time: 07/12/14  7:23 AM  Result Value Ref Range Status   Specimen Description BLOOD WRIST LEFT  Final   Special Requests BOTTLES DRAWN AEROBIC AND ANAEROBIC 5ML  Final   Culture  Setup Time   Final    07/12/2014 13:29 Performed at [facility]    Culture   Final           BLOOD CULTURE RECEIVED NO GROWTH TO DATE CULTURE WILL BE HELD FOR 5 DAYS BEFORE ISSUING A FINAL NEGATIVE REPORT Performed at [facility]    Report Status PENDING  Incomplete  Urine culture     Status: None   Collection Time: 07/12/14  7:26 AM  Result Value Ref Range Status   Specimen Description URINE, CATHETERIZED  Final   Special Requests Normal  Final   Culture  Setup Time   Final    07/12/2014 13:31 Performed at [place]   Final     15,000 COLONIES/ML Performed at [place] Performed at [facility]   Final   Report Status 07/13/2014 FINAL  Final  MRSA PCR Screening     Status: None   Collection Time: 07/12/14  2:50 PM  Result Value Ref Range Status   MRSA by PCR NEGATIVE NEGATIVE Final    Comment:        The GeneXpert MRSA Assay (FDA approved for NASAL specimens only), is one component of a comprehensive MRSA colonization surveillance program. It is not intended to diagnose MRSA infection nor to guide or monitor treatment for MRSA infections.      PLAN:  1. Reduce Diflucan to 100 mg IV q 24 hours for 4 doses [to complete 5 day course of Diflucan]. 2. Will Monitor renal function, Scr, UOP, WBC's, fever curve, any cultures/sensitivities, and clinical progression.  Thank you for allowing Pharmacy to participate in this patient's care   Estelle June,  Pharm.D. ,  07/14/2014,  8:43 PM

## 2014-07-14 NOTE — Transfer of Care (Signed)
Immediate Anesthesia Transfer of Care Note  Patient: Travis Carlson  Procedure(s) Performed: Procedure(s): ARTHROPLASTY BIPOLAR HIP (Right)  Patient Location: ICU  Anesthesia Type:General  Level of Consciousness: unresponsive and Patient remains intubated per anesthesia plan  Airway & Oxygen Therapy: Patient remains intubated per anesthesia plan and Patient placed on Ventilator (see vital sign flow sheet for setting)  Post-op Assessment: Post -op Vital signs reviewed and stable  Post vital signs: Reviewed and stable  Complications: No apparent anesthesia complications

## 2014-07-15 ENCOUNTER — Encounter (HOSPITAL_COMMUNITY): Payer: Self-pay | Admitting: Orthopedic Surgery

## 2014-07-15 LAB — CBC WITH DIFFERENTIAL/PLATELET
Basophils Absolute: 0 10*3/uL (ref 0.0–0.1)
Basophils Relative: 0 % (ref 0–1)
Eosinophils Absolute: 0.1 10*3/uL (ref 0.0–0.7)
Eosinophils Relative: 1 % (ref 0–5)
HEMATOCRIT: 30.6 % — AB (ref 39.0–52.0)
Hemoglobin: 10 g/dL — ABNORMAL LOW (ref 13.0–17.0)
LYMPHS PCT: 8 % — AB (ref 12–46)
Lymphs Abs: 0.9 10*3/uL (ref 0.7–4.0)
MCH: 27.5 pg (ref 26.0–34.0)
MCHC: 32.7 g/dL (ref 30.0–36.0)
MCV: 84.3 fL (ref 78.0–100.0)
MONO ABS: 1.4 10*3/uL — AB (ref 0.1–1.0)
Monocytes Relative: 12 % (ref 3–12)
NEUTROS ABS: 9.4 10*3/uL — AB (ref 1.7–7.7)
Neutrophils Relative %: 80 % — ABNORMAL HIGH (ref 43–77)
Platelets: 111 10*3/uL — ABNORMAL LOW (ref 150–400)
RBC: 3.63 MIL/uL — AB (ref 4.22–5.81)
RDW: 15.8 % — AB (ref 11.5–15.5)
WBC: 11.8 10*3/uL — AB (ref 4.0–10.5)

## 2014-07-15 LAB — GLUCOSE, CAPILLARY
GLUCOSE-CAPILLARY: 216 mg/dL — AB (ref 70–99)
Glucose-Capillary: 254 mg/dL — ABNORMAL HIGH (ref 70–99)

## 2014-07-15 LAB — BASIC METABOLIC PANEL
Anion gap: 13 (ref 5–15)
BUN: 26 mg/dL — ABNORMAL HIGH (ref 6–23)
CHLORIDE: 112 meq/L (ref 96–112)
CO2: 18 meq/L — AB (ref 19–32)
CREATININE: 2.72 mg/dL — AB (ref 0.50–1.35)
Calcium: 7.4 mg/dL — ABNORMAL LOW (ref 8.4–10.5)
GFR calc Af Amer: 26 mL/min — ABNORMAL LOW (ref >90)
GFR calc non Af Amer: 22 mL/min — ABNORMAL LOW (ref >90)
Glucose, Bld: 269 mg/dL — ABNORMAL HIGH (ref 70–99)
Potassium: 4 mEq/L (ref 3.7–5.3)
SODIUM: 143 meq/L (ref 137–147)

## 2014-07-15 LAB — TRIGLYCERIDES: Triglycerides: 158 mg/dL — ABNORMAL HIGH (ref <150)

## 2014-07-15 MED ORDER — VANCOMYCIN HCL IN DEXTROSE 1-5 GM/200ML-% IV SOLN
1000.0000 mg | INTRAVENOUS | Status: DC
  Administered 2014-07-15 – 2014-07-19 (×5): 1000 mg via INTRAVENOUS
  Filled 2014-07-15 (×6): qty 200

## 2014-07-15 MED ORDER — PIPERACILLIN-TAZOBACTAM IN DEX 2-0.25 GM/50ML IV SOLN
2.2500 g | Freq: Four times a day (QID) | INTRAVENOUS | Status: DC
  Administered 2014-07-15 – 2014-07-16 (×4): 2.25 g via INTRAVENOUS
  Filled 2014-07-15 (×6): qty 50
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
## 2014-07-15 NOTE — Progress Notes (Signed)
S:intubated and sedated O:BP 119/52 mmHg  Pulse 78  Temp(Src) 102 F (38.9 C) (Oral)  Resp 14  Ht 5' 10"  (1.778 m)  Wt 79.379 kg (175 lb)  BMI 25.11 kg/m2  SpO2 97%  Intake/Output Summary (Last 24 hours) at 07/15/14 0754 Last data filed at 07/15/14 0700  Gross per 24 hour  Intake 5731.85 ml  Output   4050 ml  Net 1681.85 ml   Weight change:  DZH:GDJMEQA, intubated CVS:RRR Resp:clear ant Abd: + BS. + ostomy ouput, ND Ext: tr edema.   NEURO:sedated    . antiseptic oral rinse  7 mL Mouth Rinse QID  . chlorhexidine  15 mL Mouth Rinse BID  . citric acid-sodium citrate  30 mL Per Tube TID  . docusate  100 mg Oral Daily  . feeding supplement (PIVOT 1.5 CAL)  1,000 mL Per Tube Q24H  . fluconazole (DIFLUCAN) IV  100 mg Intravenous Q24H  . insulin aspart  0-15 Units Subcutaneous 6 times per day  . insulin detemir  10 Units Subcutaneous QHS  . pantoprazole  40 mg Oral Daily   Or  . pantoprazole (PROTONIX) IV  40 mg Intravenous Daily  . piperacillin-tazobactam (ZOSYN)  IV  2.25 g Intravenous Q8H  . vancomycin  1,000 mg Intravenous Q48H   Dg Hip Complete Right  07/14/2014   CLINICAL DATA:  Post RIGHT total hip replacement, femoral neck fracture  EXAM: RIGHT HIP - COMPLETE 2+ VIEW  COMPARISON:  Portable exam 1505 hr compared to intraoperative images of 07/14/2014  FINDINGS: RIGHT hip prosthesis identified in expected position.  No definite fracture, dislocation or bone destruction.  Greater trochanter is less well profiled than on the preceding intraoperative images but demonstrates fracture as noted on preoperative images.  No additional focal bony abnormalities identified.  IMPRESSION: Post RIGHT hip arthroplasty.  Greater trochanteric fracture fragment again noted.   Electronically Signed   By: Lavonia Dana M.D.   On: 07/14/2014 15:26   Dg Hip Operative Right  07/14/2014   CLINICAL DATA:  Postop  EXAM: DG OPERATIVE right HIP 1-2 VIEWS  TECHNIQUE: Fluoroscopic spot image(s) were  submitted for interpretation post-operatively.  COMPARISON:  07/12/2014  FINDINGS: Right hip hemiarthroplasty is in place. Anatomic alignment of the osseous and prostatic structures. No breakage or loosening of the hardware.  IMPRESSION: Right hip hemiarthroplasty anatomically aligned.   Electronically Signed   By: Maryclare Bean M.D.   On: 07/14/2014 14:09   Dg Chest Port 1 View  07/14/2014   CLINICAL DATA:  Post aortic endovascular stent graft  EXAM: PORTABLE CHEST - 1 VIEW  COMPARISON:  Portable exam 0620 hr compared to 07/13/2014  FINDINGS: Tip of endotracheal tube tube projects 4.6 cm above carina.  Nasogastric tube extends into stomach.  RIGHT subclavian central venous catheter tip projects over cavoatrial junction.  Aortic stent graft noted.  Enlargement of cardiac silhouette with pulmonary vascular congestion.  Minimal perihilar edema.  Atelectasis versus consolidation in both lower lobes greater on LEFT.  No pneumothorax.  IMPRESSION: Persistent bibasilar atelectasis versus consolidation much greater in LEFT lower lobe.  Minimal perihilar edema.  Pulmonary opacities have increased since previous exam.   Electronically Signed   By: Lavonia Dana M.D.   On: 07/14/2014 08:02   BMET    Component Value Date/Time   NA 143 07/15/2014 0430   K 4.0 07/15/2014 0430   CL 112 07/15/2014 0430   CO2 18* 07/15/2014 0430   GLUCOSE 269* 07/15/2014 0430   BUN 26*  07/15/2014 0430   CREATININE 2.72* 07/15/2014 0430   CALCIUM 7.4* 07/15/2014 0430   GFRNONAA 22* 07/15/2014 0430   GFRAA 26* 07/15/2014 0430   CBC    Component Value Date/Time   WBC 11.8* 07/15/2014 0430   RBC 3.63* 07/15/2014 0430   HGB 10.0* 07/15/2014 0430   HCT 30.6* 07/15/2014 0430   PLT 111* 07/15/2014 0430   MCV 84.3 07/15/2014 0430   MCH 27.5 07/15/2014 0430   MCHC 32.7 07/15/2014 0430   RDW 15.8* 07/15/2014 0430   LYMPHSABS 0.9 07/15/2014 0430   MONOABS 1.4* 07/15/2014 0430   EOSABS 0.1 07/15/2014 0430   BASOSABS 0.0 07/15/2014  0430     Assessment: 1.  Acute on CKD, UO good and Scr trending down.  His kidneys have done amazingly well 2. 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 Met acidosis on bicitra 4. 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  Rt hip fx  SP ORIF and hemiarthoplasty   Plan: 1. Little to add from nephrologic standpoint as renal fx cont to improve.   Urology to address Rt ureteral obstruction and bladder mass when appropriate  2. Daily labs  Lovie Agresta T

## 2014-07-15 NOTE — Progress Notes (Signed)
Attempted multiple times over several hours to teach pt about incentive spirometer, how to use and why, pt refused each time.  Attempted to have pt cough and deep breath, pt refused.
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
## 2014-07-15 NOTE — Progress Notes (Addendum)
[facility] [place]  Subjective  - Intubated and sedated.   Objective 119/52 78 102 F (38.9 C) (Oral) 14 97%  Intake/Output Summary (Last 24 hours) at 07/15/14 0738 Last data filed at 07/15/14 0700  Gross per 24 hour  Intake 5731.85 ml  Output   4050 ml  Net 1681.85 ml    Left groin soft Feet warm and well perfused  Assessment/Planning: POD #[id number]  Stable from vascular stand point.  CTA of chest at 4 weeks post-op.  Laurence Slate [facility] 07/15/2014 7:38 AM --  Laboratory Lab Results:  Recent Labs  07/14/14 0430  07/14/14 1241 07/15/14 0430  WBC 9.7  --   --  11.8*  HGB 8.5*  < > 9.2* 10.0*  HCT 26.5*  < > 27.0* 30.6*  PLT 121*  --   --  111*  < > = values in this interval not displayed. BMET  Recent Labs  07/14/14 0430  07/14/14 1241 07/15/14 0430  NA 138  < > 140 143  K 4.1  < > 4.2 4.0  CL 109  --   --  112  CO2 16*  --   --  18*  GLUCOSE 196*  --   --  269*  BUN 29*  --   --  26*  CREATININE 3.21*  --   --  2.72*  CALCIUM 7.5*  --   --  7.4*  < > = values in this interval not displayed.  COAG Lab Results  Component Value Date   INR 1.44 07/12/2014   INR 1.20 07/12/2014   No results found for: PTT    I agree with the above.  The patient is stable from a vascular perspective  Annamarie Major
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
## 2014-07-15 NOTE — Progress Notes (Signed)
Follow up - Trauma and Critical Care  Patient Details:    Travis Carlson is an 68 y.o. male.  Lines/tubes : Airway 7 mm (Active)  Secured at (cm) 24 cm 07/15/2014  4:00 AM  Measured From Lips 07/15/2014  4:00 AM  Secured Location Right 07/15/2014  4:00 AM  Secured By [employer] 07/15/2014  4:00 AM  Tube Holder Repositioned Yes 07/15/2014  3:44 AM  Cuff Pressure (cm H2O) 26 cm H2O 07/15/2014  3:44 AM  Site Condition Dry 07/15/2014  4:00 AM     CVC Triple Lumen 07/12/14 Right Subclavian (Active)  Indication for Insertion or Continuance of Line Prolonged intravenous therapies 07/14/2014  8:00 PM  Site Assessment Clean;Dry;Intact 07/14/2014  8:00 PM  Proximal Lumen Status Infusing;Flushed;Capped (Central line);Blood return noted 07/14/2014  8:00 PM  Medial Infusing;Flushed;Capped (Central line);Blood return noted 07/14/2014  8:00 PM  Distal Lumen Status Infusing;Blood return noted 07/14/2014  8:00 PM  Dressing Type Transparent;Occlusive 07/14/2014  8:00 PM  Dressing Status Clean;Dry;Intact 07/14/2014  8:00 PM  Line Care Connections checked and tightened;Zeroed and calibrated 07/14/2014  8:00 PM  Dressing Change Due 07/19/14 07/14/2014  8:00 AM     Arterial Line 07/12/14 Left Radial (Active)  Site Assessment Clean;Dry;Intact 07/14/2014  8:00 PM  Line Status Pulsatile blood flow 07/14/2014  8:00 PM  Art Line Waveform Dampened;Square wave test performed;Appropriate 07/14/2014  8:00 PM  Art Line Interventions Zeroed and calibrated;Leveled;Connections checked and tightened;Flushed per protocol 07/14/2014  8:00 PM  Color/Movement/Sensation Capillary refill less than 3 sec 07/14/2014  8:00 PM  Dressing Type Transparent;Occlusive 07/14/2014  8:00 PM  Dressing Status Clean;Dry;Intact 07/14/2014  8:00 PM  Dressing Change Due 07/19/14 07/14/2014  8:00 PM     NG/OG Tube Orogastric 18 Fr. Right mouth (Active)  Placement Verification Auscultation 07/15/2014  4:00 AM  Site Assessment Clean;Dry;Intact 07/15/2014   4:00 AM  Status Infusing tube feed;Irrigated 07/15/2014  4:00 AM  Amount of suction 60 mmHg 07/12/2014  7:12 AM  Drainage Appearance Bile 07/13/2014  8:00 AM  Gastric Residual 100 mL 07/15/2014  4:00 AM  Intake (mL) 60 mL 07/15/2014  4:00 AM  Output (mL) 50 mL 07/13/2014 12:00 PM     Ileostomy RLQ (Active)  Ostomy Pouch Intact 07/14/2014  8:00 PM  Stoma Assessment Red 07/14/2014  8:00 PM  Peristomal Assessment Intact 07/14/2014  8:00 PM  Output (mL) 175 mL 07/15/2014  6:00 AM     Urethral Catheter Hayley Ringley, RN/ Dr. Grandville Silos Temperature probe 16 Fr. (Active)  Indication for Insertion or Continuance of Catheter Unstable critical patients (first 24-48 hours) 07/14/2014  8:00 PM  Site Assessment Clean;Intact 07/14/2014  8:00 PM  Catheter Maintenance Bag below level of bladder;Catheter secured;Drainage bag/tubing not touching floor;Insertion date on drainage bag;No dependent loops;Seal intact 07/14/2014  8:00 PM  Collection Container Standard drainage bag 07/14/2014  8:00 PM  Securement Method Leg strap 07/14/2014  8:00 PM  Urinary Catheter Interventions Unclamped 07/14/2014  8:00 PM  Output (mL) 175 mL 07/15/2014  6:00 AM    Microbiology/Sepsis markers: Results for orders placed or performed during the hospital encounter of 07/12/14  Blood culture (routine x 2)     Status: None (Preliminary result)   Collection Time: 07/12/14  7:00 AM  Result Value Ref Range Status   Specimen Description BLOOD LEFT SHOULDER  Final   Special Requests BOTTLES DRAWN AEROBIC AND ANAEROBIC 10MLS  Final   Culture  Setup Time   Final    07/12/2014 13:29 Performed at [facility]    Culture   Final           BLOOD CULTURE RECEIVED NO GROWTH TO DATE CULTURE WILL BE HELD FOR 5 DAYS BEFORE ISSUING A FINAL NEGATIVE REPORT Performed at [facility]    Report Status PENDING  Incomplete  Blood culture (routine x 2)     Status: None (Preliminary result)   Collection Time: 07/12/14  7:23 AM  Result Value  Ref Range Status   Specimen Description BLOOD WRIST LEFT  Final   Special Requests BOTTLES DRAWN AEROBIC AND ANAEROBIC 5ML  Final   Culture  Setup Time   Final    07/12/2014 13:29 Performed at [facility]    Culture   Final           BLOOD CULTURE RECEIVED NO GROWTH TO DATE CULTURE WILL BE HELD FOR 5 DAYS BEFORE ISSUING A FINAL NEGATIVE REPORT Performed at [facility]    Report Status PENDING  Incomplete  Urine culture     Status: None   Collection Time: 07/12/14  7:26 AM  Result Value Ref Range Status   Specimen Description URINE, CATHETERIZED  Final   Special Requests Normal  Final   Culture  Setup Time   Final    07/12/2014 13:31 Performed at [facility]   Final    15,000 COLONIES/ML Performed at [place] Performed at [facility]   Final   Report Status 07/13/2014 FINAL  Final  MRSA PCR Screening     Status: None   Collection Time: 07/12/14  2:50 PM  Result Value Ref Range Status   MRSA by PCR NEGATIVE NEGATIVE Final    Comment:        The GeneXpert MRSA Assay (FDA approved for NASAL specimens only), is one component of a comprehensive MRSA colonization surveillance program. It is not intended to diagnose MRSA infection nor to guide or monitor treatment for MRSA infections.     Anti-infectives:  Anti-infectives    Start     Dose/Rate Route Frequency Ordered Stop   07/15/14 0900  fluconazole (DIFLUCAN) IVPB 100 mg     100 mg50 mL/hr over 60 Minutes Intravenous Every 24 hours 07/14/14 2053 07/19/14 0859   07/14/14 1700  piperacillin-tazobactam (ZOSYN) IVPB 2.25 g     2.25 g100 mL/hr over 30 Minutes Intravenous Every 8 hours 07/14/14 1529     07/14/14 1430  vancomycin (VANCOCIN) IVPB 1000 mg/200 mL premix     1,000 mg200 mL/hr over 60 Minutes Intravenous Every 48 hours 07/12/14 1411     07/14/14 0900  fluconazole (DIFLUCAN) IVPB 200 mg  Status:  Discontinued     200 mg100 mL/hr  over 60 Minutes Intravenous Every 24 hours 07/14/14 0844 07/14/14 2052   07/13/14 0800  cefTRIAXone (ROCEPHIN) 1 g in dextrose 5 % 50 mL IVPB - Premix  Status:  Discontinued     1 g100 mL/hr over 30 Minutes Intravenous Every 24 hours 07/12/14 1302 07/12/14 1409   07/12/14 1700  piperacillin-tazobactam (ZOSYN) IVPB 3.375 g  Status:  Discontinued     3.375 g12.5 mL/hr over 240 Minutes Intravenous Every 8 hours 07/12/14 0945 07/14/14 1529   07/12/14 1000  vancomycin (VANCOCIN) IVPB 1000 mg/200 mL premix  Status:  Discontinued     1,000 mg200 mL/hr over 60 Minutes Intravenous Every 48 hours 07/12/14 0935 07/12/14 1411   07/12/14 1000  piperacillin-tazobactam (ZOSYN) IVPB  3.375 g  Status:  Discontinued     3.375 g12.5 mL/hr over 240 Minutes Intravenous Every 8 hours 07/12/14 0935 07/12/14 0944   07/12/14 1000  piperacillin-tazobactam (ZOSYN) IVPB 3.375 g  Status:  Discontinued     3.375 g100 mL/hr over 30 Minutes Intravenous  Once 07/12/14 0944 07/12/14 1408   07/12/14 0815  cefTRIAXone (ROCEPHIN) 2 g in dextrose 5 % 50 mL IVPB  Status:  Discontinued     2 g100 mL/hr over 30 Minutes Intravenous Every 24 hours 07/12/14 0807 07/13/14 1012   07/12/14 0715  piperacillin-tazobactam (ZOSYN) IVPB 3.375 g  Status:  Discontinued     3.375 g100 mL/hr over 30 Minutes Intravenous  Once 07/12/14 0708 07/12/14 0807   07/12/14 0715  vancomycin (VANCOCIN) IVPB 1000 mg/200 mL premix  Status:  Discontinued     1,000 mg200 mL/hr over 60 Minutes Intravenous  Once 07/12/14 0708 07/12/14 6546      Best Practice/Protocols:  VTE Prophylaxis: Mechanical GI Prophylaxis: Proton Pump Inhibitor Continous Sedation  Consults: Treatment Team:  Rozanna Box, MD Alexis Frock, MD Windy Kalata, MD    Events:  Subjective:    Overnight Issues: Patient had surgery done yesterday, and has been stable since.  BUN and creatinine are improving.  Objective:  Vital signs for last 24 hours: Temp:  [98.4 F (36.9  C)-102 F (38.9 C)] 102 F (38.9 C) (12/04 0400) Pulse Rate:  [70-97] 78 (12/04 0700) Resp:  [7-26] 14 (12/04 0700) BP: (116-157)/(51-67) 119/52 mmHg (12/04 0700) SpO2:  [95 %-99 %] 97 % (12/04 0700) Arterial Line BP: (86-195)/(39-139) 105/39 mmHg (12/04 0700) FiO2 (%):  [40 %] 40 % (12/04 0600)  Hemodynamic parameters for last 24 hours: CVP:  [9 mmHg-13 mmHg] 12 mmHg  Intake/Output from previous day: 12/03 0701 - 12/04 0700 In: 5731.9 [I.V.:3167.9; Blood:1005; NG/GT:796.5; IV Piggyback:762.5] Out: 5035 [Urine:2350; Stool:900; Blood:800]  Intake/Output this shift:    Vent settings for last 24 hours: Vent Mode:  [-] PRVC FiO2 (%):  [40 %] 40 % Set Rate:  [15 bmp] 15 bmp Vt Set:  [580 mL] 580 mL PEEP:  [5 cmH20] 5 cmH20 Pressure Support:  [5 cmH20] 5 cmH20 Plateau Pressure:  [14 cmH20] 14 cmH20  Physical Exam:  General: no respiratory distress and sedated Neuro: nonfocal exam, RASS -1 and RASS -2 Resp: clear to auscultation bilaterally CVS: regular rate and rhythm, S1, S2 normal, no murmur, click, rub or gallop GI: soft, nontender, BS WNL, no r/g and tolerating tube feedings at 40cc/hr very well Extremities: Good palpable pulses, very edematous right thigh, dressing is saturated.  Results for orders placed or performed during the hospital encounter of 07/12/14 (from the past 24 hour(s))  I-STAT 3, arterial blood gas (G3+)     Status: Abnormal   Collection Time: 07/14/14  9:18 AM  Result Value Ref Range   pH, Arterial 7.288 (L) 7.350 - 7.450   pCO2 arterial 37.8 35.0 - 45.0 mmHg   pO2, Arterial 91.0 80.0 - 100.0 mmHg   Bicarbonate 18.1 (L) 20.0 - 24.0 mEq/L   TCO2 19 0 - 100 mmol/L   O2 Saturation 96.0 %   Acid-base deficit 8.0 (H) 0.0 - 2.0 mmol/L   Patient temperature 98.9 F    Sample type ARTERIAL   I-STAT 7, (LYTES, BLD GAS, ICA, H+H)     Status: Abnormal   Collection Time: 07/14/14 11:35 AM  Result Value Ref Range   pH, Arterial 7.305 (L) 7.350 - 7.450  pCO2  arterial 35.3 35.0 - 45.0 mmHg   pO2, Arterial 123.0 (H) 80.0 - 100.0 mmHg   Bicarbonate 17.6 (L) 20.0 - 24.0 mEq/L   TCO2 19 0 - 100 mmol/L   O2 Saturation 98.0 %   Acid-base deficit 8.0 (H) 0.0 - 2.0 mmol/L   Sodium 140 137 - 147 mEq/L   Potassium 4.1 3.7 - 5.3 mEq/L   Calcium, Ion 1.14 1.13 - 1.30 mmol/L   HCT 27.0 (L) 39.0 - 52.0 %   Hemoglobin 9.2 (L) 13.0 - 17.0 g/dL   Patient temperature 36.9 C    Sample type ARTERIAL   I-STAT 7, (LYTES, BLD GAS, ICA, H+H)     Status: Abnormal   Collection Time: 07/14/14 12:41 PM  Result Value Ref Range   pH, Arterial 7.312 (L) 7.350 - 7.450   pCO2 arterial 34.1 (L) 35.0 - 45.0 mmHg   pO2, Arterial 128.0 (H) 80.0 - 100.0 mmHg   Bicarbonate 17.3 (L) 20.0 - 24.0 mEq/L   TCO2 18 0 - 100 mmol/L   O2 Saturation 99.0 %   Acid-base deficit 8.0 (H) 0.0 - 2.0 mmol/L   Sodium 140 137 - 147 mEq/L   Potassium 4.2 3.7 - 5.3 mEq/L   Calcium, Ion 1.13 1.13 - 1.30 mmol/L   HCT 27.0 (L) 39.0 - 52.0 %   Hemoglobin 9.2 (L) 13.0 - 17.0 g/dL   Patient temperature 36.7 C    Sample type ARTERIAL   Prepare RBC     Status: None   Collection Time: 07/14/14  1:00 PM  Result Value Ref Range   Order Confirmation ORDER PROCESSED BY BLOOD BANK   Glucose, capillary     Status: Abnormal   Collection Time: 07/14/14  3:43 PM  Result Value Ref Range   Glucose-Capillary 168 (H) 70 - 99 mg/dL   Comment 1 Arterial Sample   Glucose, capillary     Status: Abnormal   Collection Time: 07/14/14  7:29 PM  Result Value Ref Range   Glucose-Capillary 203 (H) 70 - 99 mg/dL   Comment 1 Capillary Sample    Comment 2 Notify RN    Comment 3 Documented in Chart   Glucose, capillary     Status: Abnormal   Collection Time: 07/14/14 11:46 PM  Result Value Ref Range   Glucose-Capillary 231 (H) 70 - 99 mg/dL   Comment 1 Capillary Sample    Comment 2 Documented in Chart    Comment 3 Notify RN   Glucose, capillary     Status: Abnormal   Collection Time: 07/15/14  3:48 AM  Result  Value Ref Range   Glucose-Capillary 254 (H) 70 - 99 mg/dL   Comment 1 Capillary Sample    Comment 2 Documented in Chart    Comment 3 Notify RN   CBC with Differential     Status: Abnormal   Collection Time: 07/15/14  4:30 AM  Result Value Ref Range   WBC 11.8 (H) 4.0 - 10.5 K/uL   RBC 3.63 (L) 4.22 - 5.81 MIL/uL   Hemoglobin 10.0 (L) 13.0 - 17.0 g/dL   HCT 30.6 (L) 39.0 - 52.0 %   MCV 84.3 78.0 - 100.0 fL   MCH 27.5 26.0 - 34.0 pg   MCHC 32.7 30.0 - 36.0 g/dL   RDW 15.8 (H) 11.5 - 15.5 %   Platelets 111 (L) 150 - 400 K/uL   Neutrophils Relative % 80 (H) 43 - 77 %   Neutro Abs 9.4 (H) 1.7 -  7.7 K/uL   Lymphocytes Relative 8 (L) 12 - 46 %   Lymphs Abs 0.9 0.7 - 4.0 K/uL   Monocytes Relative 12 3 - 12 %   Monocytes Absolute 1.4 (H) 0.1 - 1.0 K/uL   Eosinophils Relative 1 0 - 5 %   Eosinophils Absolute 0.1 0.0 - 0.7 K/uL   Basophils Relative 0 0 - 1 %   Basophils Absolute 0.0 0.0 - 0.1 K/uL  Basic metabolic panel     Status: Abnormal   Collection Time: 07/15/14  4:30 AM  Result Value Ref Range   Sodium 143 137 - 147 mEq/L   Potassium 4.0 3.7 - 5.3 mEq/L   Chloride 112 96 - 112 mEq/L   CO2 18 (L) 19 - 32 mEq/L   Glucose, Bld 269 (H) 70 - 99 mg/dL   BUN 26 (H) 6 - 23 mg/dL   Creatinine, Ser 2.72 (H) 0.50 - 1.35 mg/dL   Calcium 7.4 (L) 8.4 - 10.5 mg/dL   GFR calc non Af Amer 22 (L) >90 mL/min   GFR calc Af Amer 26 (L) >90 mL/min   Anion gap 13 5 - 15     Assessment/Plan:   NEURO  Altered Mental Status:  sedation   Plan: Wean sea\dation as we plan for extubation soon  PULM  Atelectasis/collapse (bibasilar)   Plan: Still plan on weaning for extubation  CARDIO  No specific cardiac issues   Plan: CPM  RENAL  Oliguria (possibly due to low CO and but has improved)   Plan: CPM  GI  No issues   Plan: Continue tube feedings until ready for extubation  ID  Lower Urinary Tract Infection (canidida albicans and with foley catheter)   Plan: Low colony count ambiguous for  infection, being treated  HEME  Anemia acute blood loss anemia and anemia of critical illness)   Plan: No need for blood now  ENDO No specific issues   Plan: CPM  Global Issues  Doing well enough to be extubated.  Renal fuction has signficantly improved.  Has had surgery.  Weaning to extubated    LOS: 3 days   Additional comments:I reviewed the patient's new clinical lab test results. cbc/bmet and I reviewed the patients new imaging test results. cxr pending  Critical Care Total Time*: 30 Minutes  Tirrell Buchberger, JAY 07/15/2014  *Care during the described time interval was provided by me and/or other providers on the critical care team.  I have reviewed this patient's available data, including medical history, events of note, physical examination and test results as part of my evaluation.

## 2014-07-15 NOTE — Progress Notes (Signed)
Inpatient Diabetes Program Recommendations  AACE/ADA: New Consensus Statement on Inpatient Glycemic Control (2013)  Target Ranges:  Prepandial:   less than 140 mg/dL      Peak postprandial:   less than 180 mg/dL (1-2 hours)      Critically ill patients:  140 - 180 mg/dL   Reason for Assessment:  Results for Travis Carlson, Travis Carlson (MRN [id number]) as of 07/15/2014 10:04  Ref. Range 07/14/2014 15:43 07/14/2014 19:29 07/14/2014 23:46 07/15/2014 03:48  Glucose-Capillary Latest Range: 70-99 mg/dL 168 (H) 203 (H) 231 (H) 254 (H)   Diabetes history: None noted Current orders for Inpatient glycemic control:  Levemir 10 units, Novolog moderate q 4 hours  Note CBG's increased with tube feeds.   Consider adding Novolog tube feed coverage 3 units q 4 hours.   Thanks, Adah Perl, RN, BC-ADM Inpatient Diabetes Coordinator Pager [phone]

## 2014-07-15 NOTE — Procedures (Signed)
Extubation Procedure Note  Patient Details:   Name: Travis Carlson DOB: July 19, 1946 MRN: [id number]   Airway Documentation:     Evaluation  O2 sats: stable throughout and currently acceptable Complications: No apparent complications Patient did tolerate procedure well. Bilateral Breath Sounds: Clear Suctioning: Airway Yes   Prior to extubation:  Pt suctioned orally and via ETT, positive cuff leak noted.  Post-extubation: Pt able to cough to produce sputum, state his name and that he is at "[facility]", no stridor noted.  Miquel Dunn 07/15/2014, 4:14 PM

## 2014-07-15 NOTE — Progress Notes (Signed)
Wasted 25 mL of propofol with Tommy Rainwater.

## 2014-07-16 ENCOUNTER — Inpatient Hospital Stay (HOSPITAL_COMMUNITY): Payer: Medicare PPO

## 2014-07-16 ENCOUNTER — Encounter (HOSPITAL_COMMUNITY): Payer: Self-pay | Admitting: *Deleted

## 2014-07-16 LAB — TYPE AND SCREEN
ABO/RH(D): A POS
ANTIBODY SCREEN: NEGATIVE
UNIT DIVISION: 0
UNIT DIVISION: 0
UNIT DIVISION: 0
UNIT DIVISION: 0
UNIT DIVISION: 0
Unit division: 0
Unit division: 0
Unit division: 0
Unit division: 0
Unit division: 0
Unit division: 0
Unit division: 0
Unit division: 0
Unit division: 0

## 2014-07-16 LAB — GLUCOSE, CAPILLARY
GLUCOSE-CAPILLARY: 118 mg/dL — AB (ref 70–99)
GLUCOSE-CAPILLARY: 119 mg/dL — AB (ref 70–99)
GLUCOSE-CAPILLARY: 143 mg/dL — AB (ref 70–99)
GLUCOSE-CAPILLARY: 155 mg/dL — AB (ref 70–99)
Glucose-Capillary: 250 mg/dL — ABNORMAL HIGH (ref 70–99)
Glucose-Capillary: 168 mg/dL — ABNORMAL HIGH (ref 70–99)
Glucose-Capillary: 115 mg/dL — ABNORMAL HIGH (ref 70–99)
Glucose-Capillary: 135 mg/dL — ABNORMAL HIGH (ref 70–99)
Glucose-Capillary: 148 mg/dL — ABNORMAL HIGH (ref 70–99)

## 2014-07-16 LAB — RENAL FUNCTION PANEL
ALBUMIN: 2 g/dL — AB (ref 3.5–5.2)
Anion gap: 13 (ref 5–15)
BUN: 25 mg/dL — ABNORMAL HIGH (ref 6–23)
CO2: 21 mEq/L (ref 19–32)
CREATININE: 2.75 mg/dL — AB (ref 0.50–1.35)
Calcium: 8.2 mg/dL — ABNORMAL LOW (ref 8.4–10.5)
Chloride: 112 mEq/L (ref 96–112)
GFR calc Af Amer: 25 mL/min — ABNORMAL LOW (ref >90)
GFR, EST NON AFRICAN AMERICAN: 22 mL/min — AB (ref >90)
Glucose, Bld: 125 mg/dL — ABNORMAL HIGH (ref 70–99)
PHOSPHORUS: 2.1 mg/dL — AB (ref 2.3–4.6)
Potassium: 3.7 mEq/L (ref 3.7–5.3)
Sodium: 146 mEq/L (ref 137–147)

## 2014-07-16 MED ORDER — HYDROMORPHONE HCL 1 MG/ML IJ SOLN
0.5000 mg | INTRAMUSCULAR | Status: DC | PRN
  Administered 2014-07-16: 0.5 mg via INTRAVENOUS
  Administered 2014-07-16 (×2): 1 mg via INTRAVENOUS
  Administered 2014-07-17 – 2014-07-18 (×2): 0.5 mg via INTRAVENOUS
  Administered 2014-07-20 – 2014-07-22 (×10): 1 mg via INTRAVENOUS
  Filled 2014-07-16: qty 1
  Filled 2014-07-16: qty 2
  Filled 2014-07-16 (×2): qty 1
  Filled 2014-07-16: qty 2
  Filled 2014-07-16 (×12): qty 1

## 2014-07-16 MED ORDER — NALOXONE HCL 0.4 MG/ML IJ SOLN: 0.4000 mg | INTRAMUSCULAR | Status: DC | PRN

## 2014-07-16 MED ORDER — HYDROMORPHONE 0.3 MG/ML IV SOLN: INTRAVENOUS | Status: DC

## 2014-07-16 MED ORDER — SODIUM CHLORIDE 0.9 % IJ SOLN
9.0000 mL | INTRAMUSCULAR | Status: DC | PRN
  Administered 2014-07-17: 20 mL via INTRAVENOUS
  Filled 2014-07-16: qty 9

## 2014-07-16 MED ORDER — PIPERACILLIN-TAZOBACTAM 3.375 G IVPB
3.3750 g | Freq: Three times a day (TID) | INTRAVENOUS | Status: DC
  Administered 2014-07-16 – 2014-07-20 (×12): 3.375 g via INTRAVENOUS
  Filled 2014-07-16 (×14): qty 50

## 2014-07-16 MED ORDER — DIPHENHYDRAMINE HCL 12.5 MG/5ML PO ELIX
12.5000 mg | ORAL_SOLUTION | Freq: Four times a day (QID) | ORAL | Status: DC | PRN
  Filled 2014-07-16: qty 5

## 2014-07-16 MED ORDER — DIPHENHYDRAMINE HCL 50 MG/ML IJ SOLN: 12.5000 mg | Freq: Four times a day (QID) | INTRAMUSCULAR | Status: DC | PRN

## 2014-07-16 MED ORDER — HYDROMORPHONE HCL 1 MG/ML IJ SOLN
INTRAMUSCULAR | Status: AC
  Filled 2014-07-16: qty 1

## 2014-07-16 MED ORDER — ONDANSETRON HCL 4 MG/2ML IJ SOLN: 4.0000 mg | Freq: Four times a day (QID) | INTRAMUSCULAR | Status: DC | PRN

## 2014-07-16 NOTE — Plan of Care (Signed)
Problem: Phase I Progression Outcomes Goal: Tubes/drains patent Outcome: Not Applicable Date Met:  87/06/58 Goal: Vital signs/hemodynamically stable Outcome: Completed/Met Date Met:  07/16/14

## 2014-07-16 NOTE — Progress Notes (Signed)
S: Co Rt hip pain O:BP 136/51 mmHg  Pulse 73  Temp(Src) 98.3 F (36.8 C) (Oral)  Resp 18  Ht _0  (1.778 m)  Wt 79.379 kg (175 lb)  BMI 25.11 kg/m2  SpO2 97%  Intake/Output Summary (Last 24 hours) at 07/16/14 0735 Last data filed at 07/16/14 0600  Gross per 24 hour  Intake 1748.5 ml  Output   2445 ml  Net -696.5 ml   Weight change:  IPJ:[id number] CVS:RRR Resp:clear ant Abd: + BS. + ostomy ouput, ND Ext: tr edema.   NEURO: eyes closed but answers ?   . antiseptic oral rinse  7 mL Mouth Rinse QID  . chlorhexidine  15 mL Mouth Rinse BID  . citric acid-sodium citrate  30 mL Per Tube TID  . docusate  100 mg Oral Daily  . feeding supplement (PIVOT 1.5 CAL)  1,000 mL Per Tube Q24H  . fluconazole (DIFLUCAN) IV  100 mg Intravenous Q24H  . insulin aspart  0-15 Units Subcutaneous 6 times per day  . insulin detemir  10 Units Subcutaneous QHS  . pantoprazole  40 mg Oral Daily   Or  . pantoprazole (PROTONIX) IV  40 mg Intravenous Daily  . piperacillin-tazobactam (ZOSYN)  IV  2.25 g Intravenous 4 times per day  . vancomycin  1,000 mg Intravenous Q24H   Dg Hip Complete Right  07/14/2014   CLINICAL DATA:  Post RIGHT total hip replacement, femoral neck fracture  EXAM: RIGHT HIP - COMPLETE 2+ VIEW  COMPARISON:  Portable exam 1505 hr compared to intraoperative images of 07/14/2014  FINDINGS: RIGHT hip prosthesis identified in expected position.  No definite fracture, dislocation or bone destruction.  Greater trochanter is less well profiled than on the preceding intraoperative images but demonstrates fracture as noted on preoperative images.  No additional focal bony abnormalities identified.  IMPRESSION: Post RIGHT hip arthroplasty.  Greater trochanteric fracture fragment again noted.   Electronically Signed   By: Lavonia Dana M.D.   On: 07/14/2014 15:26   Dg Hip Operative Right  07/14/2014   CLINICAL DATA:  Postop  EXAM: DG OPERATIVE right HIP 1-2 VIEWS  TECHNIQUE: Fluoroscopic spot  image(s) were submitted for interpretation post-operatively.  COMPARISON:  07/12/2014  FINDINGS: Right hip hemiarthroplasty is in place. Anatomic alignment of the osseous and prostatic structures. No breakage or loosening of the hardware.  IMPRESSION: Right hip hemiarthroplasty anatomically aligned.   Electronically Signed   By: Maryclare Bean M.D.   On: 07/14/2014 14:09   BMET    Component Value Date/Time   NA 146 07/16/2014 0426   K 3.7 07/16/2014 0426   CL 112 07/16/2014 0426   CO2 21 07/16/2014 0426   GLUCOSE 125* 07/16/2014 0426   BUN 25* 07/16/2014 0426   CREATININE 2.75* 07/16/2014 0426   CALCIUM 8.2* 07/16/2014 0426   GFRNONAA 22* 07/16/2014 0426   GFRAA 25* 07/16/2014 0426   CBC    Component Value Date/Time   WBC 11.8* 07/15/2014 0430   RBC 3.63* 07/15/2014 0430   HGB 10.0* 07/15/2014 0430   HCT 30.6* 07/15/2014 0430   PLT 111* 07/15/2014 0430   MCV 84.3 07/15/2014 0430   MCH 27.5 07/15/2014 0430   MCHC 32.7 07/15/2014 0430   RDW 15.8* 07/15/2014 0430   LYMPHSABS 0.9 07/15/2014 0430   MONOABS 1.4* 07/15/2014 0430   EOSABS 0.1 07/15/2014 0430   BASOSABS 0.0 07/15/2014 0430     Assessment: 1.  Acute on CKD, UO good.  Scr leveling off at  2.7.  ? If this is his baseline though it should improve with relieve of his obstruction.  He is able to tell me that renal fx was 30% and he sees a nephrologist in [place] 2. 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 Met acidosis on bicitra 4. 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  Rt hip fx  SP ORIF and hemiarthoplasty   Plan: 1. He is certainly out of the danger zone regarding his kidneys ( at least for the time being).  Since I am not adding much and he is probably close to his baseline CKD renal fx, I will sign off.  Call if further renal issues arise  Travis Carlson

## 2014-07-16 NOTE — Plan of Care (Signed)
Problem: Phase I Progression Outcomes Goal: Sutures/staples intact Outcome: Completed/Met Date Met:  07/16/14

## 2014-07-16 NOTE — Progress Notes (Signed)
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 WELLS 07/16/2014 5:37 AM --  Filed Vitals:   07/16/14 0500  BP: 138/63  Pulse: 72  Temp:   Resp: 17    Intake/Output Summary (Last 24 hours) at 07/16/14 0537 Last data filed at 07/16/14 0511  Gross per 24 hour  Intake 1910.7 ml  Output   2635 ml  Net -724.3 ml     Laboratory CBC    Component Value Date/Time   WBC 11.8* 07/15/2014 0430   HGB 10.0* 07/15/2014 0430   HCT 30.6* 07/15/2014 0430   PLT 111* 07/15/2014 0430    BMET    Component Value Date/Time   NA 146 07/16/2014 0426   K 3.7 07/16/2014 0426   CL 112 07/16/2014 0426   CO2 21 07/16/2014 0426   GLUCOSE 125* 07/16/2014 0426   BUN 25* 07/16/2014 0426   CREATININE 2.75* 07/16/2014 0426   CALCIUM 8.2* 07/16/2014 0426   GFRNONAA 22* 07/16/2014 0426   GFRAA 25* 07/16/2014 0426    COAG Lab Results  Component Value Date   INR 1.44 07/12/2014   INR 1.20 07/12/2014   No results found for: PTT  Antibiotics Anti-infectives    Start     Dose/Rate Route Frequency Ordered Stop   07/15/14 1430  vancomycin (VANCOCIN) IVPB 1000 mg/200 mL premix     1,000 mg200 mL/hr over 60 Minutes Intravenous Every 24 hours 07/15/14 0959     07/15/14 1100  piperacillin-tazobactam (ZOSYN) IVPB 2.25 g     2.25 g100 mL/hr over 30 Minutes Intravenous 4 times per day 07/15/14 0959     07/15/14 0900  fluconazole (DIFLUCAN) IVPB 100 mg     100 mg50 mL/hr over 60 Minutes Intravenous Every 24 hours 07/14/14 2053 07/19/14 0859   07/14/14 1700  piperacillin-tazobactam (ZOSYN) IVPB 2.25 g  Status:  Discontinued     2.25 g100 mL/hr over 30 Minutes Intravenous Every 8 hours 07/14/14 1529 07/15/14  0959   07/14/14 1430  vancomycin (VANCOCIN) IVPB 1000 mg/200 mL premix  Status:  Discontinued     1,000 mg200 mL/hr over 60 Minutes Intravenous Every 48 hours 07/12/14 1411 07/15/14 0959   07/14/14 0900  fluconazole (DIFLUCAN) IVPB 200 mg  Status:  Discontinued     200 mg100 mL/hr over 60 Minutes Intravenous Every 24 hours 07/14/14 0844 07/14/14 2052   07/13/14 0800  cefTRIAXone (ROCEPHIN) 1 g in dextrose 5 % 50 mL IVPB - Premix  Status:  Discontinued     1 g100 mL/hr over 30 Minutes Intravenous Every 24 hours 07/12/14 1302 07/12/14 1409   07/12/14 1700  piperacillin-tazobactam (ZOSYN) IVPB 3.375 g  Status:  Discontinued     3.375 g12.5 mL/hr over 240 Minutes Intravenous Every 8 hours 07/12/14 0945 07/14/14 1529   07/12/14 1000  vancomycin (VANCOCIN) IVPB 1000 mg/200 mL premix  Status:  Discontinued     1,000 mg200 mL/hr over 60 Minutes Intravenous Every 48 hours 07/12/14 0935 07/12/14 1411   07/12/14 1000  piperacillin-tazobactam (ZOSYN) IVPB 3.375 g  Status:  Discontinued     3.375 g12.5 mL/hr over 240 Minutes Intravenous Every 8 hours 07/12/14 0935 07/12/14 0944   07/12/14 1000  piperacillin-tazobactam (ZOSYN) IVPB 3.375 g  Status:  Discontinued     3.375 g100 mL/hr over 30 Minutes Intravenous  Once 07/12/14 0944 07/12/14 1408   07/12/14 0815  cefTRIAXone (ROCEPHIN) 2 g in dextrose 5 % 50 mL IVPB  Status:  Discontinued     2 g100 mL/hr over 30 Minutes Intravenous Every 24 hours 07/12/14 0807 07/13/14 1012   07/12/14 0715  piperacillin-tazobactam (ZOSYN) IVPB 3.375 g  Status:  Discontinued     3.375 g100 mL/hr over 30 Minutes Intravenous  Once 07/12/14 0708 07/12/14 0807   07/12/14 0715  vancomycin (VANCOCIN) IVPB 1000 mg/200 mL premix  Status:  Discontinued     1,000 mg200 mL/hr over 60 Minutes Intravenous  Once 07/12/14 0708 07/12/14 0807       V. Leia Alf, M.D. [facility] Office: [phone] Pager:  [phone]

## 2014-07-16 NOTE — Progress Notes (Signed)
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 No pelvic lymphadenopathy or distant disease by axial imaging this admission.  2 - Bilateral Nephrolithiasis - Rt 2cm upper pole, Lt small scattered non-obstructing stones incidetnal on trauma CT 07/12/2014. No ureteral stones.   3 - Renal Failure - Cr 4.7 on intake trauma labs. Unknown medical renal history. Very mild rt hydro to bladder w/o obstructing stones by CT. GFR improving with conservative measures despite recent contrast load. UOP vol normal, K normal.   4 - Fevers, Bacteruria - Intermittent fevers on arrival to 101. UCX with yeast only, BCX negative / pending.   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 Per report next of kin daughter estranged.   Today Travis Carlson is seen in f/u above. He is now extubated by still very confused.   Objective: Vital signs in last 24 hours: Temp:  [97.7 F (36.5 C)-100 F (37.8 C)] 100 F (37.8 C) (12/05 0800) Pulse Rate:  [71-99] 77 (12/05 0800) Resp:  [14-21] 16 (12/05 0800) BP: (123-158)/(51-83) 137/64 mmHg (12/05 0800) SpO2:  [90 %-98 %] 97 % (12/05 0800) Arterial Line BP: (105-186)/(40-63) 154/55 mmHg (12/04 2100) FiO2 (%):  [40 %] 40 % (12/04 1800) Last BM Date:  (ostomy)  Intake/Output from previous day: 12/04 0701 - 12/05 0700 In: 1798.5 [I.V.:1278.5; NG/GT:70; IV Piggyback:450] Out: 2445 [Urine:2145; Stool:300] Intake/Output this shift: Total I/O In: 50 [I.V.:50] Out: 125 [Urine:125]  General appearance: appears older than stated age, uncooperative and AO x2. In and out of conversation.  Eyes: eyes nearly swollen shut from recent trauma, improved Neck: supple, symmetrical, trachea  midline Back: symmetric, no curvature. ROM normal. No CVA tenderness. Resp: Non-labored Cardio: Nl rate GI: end eterostomy wtih liquid stool  Male genitalia: normal, light pink urine in catheter.  Extremities: Bilat LE in splint apparatus and soft boots Pulses: 2+ and symmetric Neurologic: Mental status: Alert, oriented, thought content appropriate, AOx2  Lab Results:   Recent Labs  07/14/14 0430  07/14/14 1241 07/15/14 0430  WBC 9.7  --   --  11.8*  HGB 8.5*  < > 9.2* 10.0*  HCT 26.5*  < > 27.0* 30.6*  PLT 121*  --   --  111*  < > = values in this interval not displayed. BMET  Recent Labs  07/15/14 0430 07/16/14 0426  NA 143 146  K 4.0 3.7  CL 112 112  CO2 18* 21  GLUCOSE 269* 125*  BUN 26* 25*  CREATININE 2.72* 2.75*  CALCIUM 7.4* 8.2*   PT/INR No results for input(s): LABPROT, INR in the last 72 hours. ABG  Recent Labs  07/14/14 1135 07/14/14 1241  PHART 7.305* 7.312*  HCO3 17.6* 17.3*    Studies/Results: Dg Hip Complete Right  07/14/2014   CLINICAL DATA:  Post RIGHT total hip replacement, femoral neck fracture  EXAM: RIGHT HIP - COMPLETE 2+ VIEW  COMPARISON:  Portable exam 1505 hr compared to intraoperative images of 07/14/2014  FINDINGS: RIGHT hip prosthesis identified in expected position.  No definite fracture, dislocation or bone destruction.  Greater trochanter is less well profiled than on the preceding intraoperative images but demonstrates fracture as noted on preoperative images.  No additional focal bony abnormalities identified.  IMPRESSION:  Post RIGHT hip arthroplasty.  Greater trochanteric fracture fragment again noted.   Electronically Signed   By: Lavonia Dana M.D.   On: 07/14/2014 15:26   Dg Hip Operative Right  07/14/2014   CLINICAL DATA:  Postop  EXAM: DG OPERATIVE right HIP 1-2 VIEWS  TECHNIQUE: Fluoroscopic spot image(s) were submitted for interpretation post-operatively.  COMPARISON:  07/12/2014  FINDINGS: Right hip hemiarthroplasty is in  place. Anatomic alignment of the osseous and prostatic structures. No breakage or loosening of the hardware.  IMPRESSION: Right hip hemiarthroplasty anatomically aligned.   Electronically Signed   By: Maryclare Bean M.D.   On: 07/14/2014 14:09    Anti-infectives: Anti-infectives    Start     Dose/Rate Route Frequency Ordered Stop   07/15/14 1430  vancomycin (VANCOCIN) IVPB 1000 mg/200 mL premix     1,000 mg200 mL/hr over 60 Minutes Intravenous Every 24 hours 07/15/14 0959     07/15/14 1100  piperacillin-tazobactam (ZOSYN) IVPB 2.25 g     2.25 g100 mL/hr over 30 Minutes Intravenous 4 times per day 07/15/14 0959     07/15/14 0900  fluconazole (DIFLUCAN) IVPB 100 mg     100 mg50 mL/hr over 60 Minutes Intravenous Every 24 hours 07/14/14 2053 07/19/14 0859   07/14/14 1700  piperacillin-tazobactam (ZOSYN) IVPB 2.25 g  Status:  Discontinued     2.25 g100 mL/hr over 30 Minutes Intravenous Every 8 hours 07/14/14 1529 07/15/14 0959   07/14/14 1430  vancomycin (VANCOCIN) IVPB 1000 mg/200 mL premix  Status:  Discontinued     1,000 mg200 mL/hr over 60 Minutes Intravenous Every 48 hours 07/12/14 1411 07/15/14 0959   07/14/14 0900  fluconazole (DIFLUCAN) IVPB 200 mg  Status:  Discontinued     200 mg100 mL/hr over 60 Minutes Intravenous Every 24 hours 07/14/14 0844 07/14/14 2052   07/13/14 0800  cefTRIAXone (ROCEPHIN) 1 g in dextrose 5 % 50 mL IVPB - Premix  Status:  Discontinued     1 g100 mL/hr over 30 Minutes Intravenous Every 24 hours 07/12/14 1302 07/12/14 1409   07/12/14 1700  piperacillin-tazobactam (ZOSYN) IVPB 3.375 g  Status:  Discontinued     3.375 g12.5 mL/hr over 240 Minutes Intravenous Every 8 hours 07/12/14 0945 07/14/14 1529   07/12/14 1000  vancomycin (VANCOCIN) IVPB 1000 mg/200 mL premix  Status:  Discontinued     1,000 mg200 mL/hr over 60 Minutes Intravenous Every 48 hours 07/12/14 0935 07/12/14 1411   07/12/14 1000  piperacillin-tazobactam (ZOSYN) IVPB 3.375 g  Status:  Discontinued     3.375  g12.5 mL/hr over 240 Minutes Intravenous Every 8 hours 07/12/14 0935 07/12/14 0944   07/12/14 1000  piperacillin-tazobactam (ZOSYN) IVPB 3.375 g  Status:  Discontinued     3.375 g100 mL/hr over 30 Minutes Intravenous  Once 07/12/14 0944 07/12/14 1408   07/12/14 0815  cefTRIAXone (ROCEPHIN) 2 g in dextrose 5 % 50 mL IVPB  Status:  Discontinued     2 g100 mL/hr over 30 Minutes Intravenous Every 24 hours 07/12/14 0807 07/13/14 1012   07/12/14 0715  piperacillin-tazobactam (ZOSYN) IVPB 3.375 g  Status:  Discontinued     3.375 g100 mL/hr over 30 Minutes Intravenous  Once 07/12/14 0708 07/12/14 0807   07/12/14 0715  vancomycin (VANCOCIN) IVPB 1000 mg/200 mL premix  Status:  Discontinued     1,000 mg200 mL/hr over 60 Minutes Intravenous  Once 07/12/14 0708 07/12/14 3664      Assessment/Plan: 1 - 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   2 - Bilateral Nephrolithiasis - non-obstructing, observe.  3 - Renal Failure - Improving. Again likely multifactorial. Suspect partial rt obstruction minimal component given small hydro by imaging.   4 - Fevers, Bacteruria - On course of 5 days antifungal IV as no other sources for fever clearly identified. Again, would rec Rt perc nephrosotmy if no improvement on antifungals.   Will follow    Travis Carlson 07/16/2014

## 2014-07-16 NOTE — Progress Notes (Signed)
Patient ID: Travis Carlson, male   DOB: [birth year], 68 y.o.   MRN: [id number] Follow up - Trauma and Critical Care  Patient Details:    Travis Carlson is an 68 y.o. male.  Lines/tubes : Airway 7 mm (Active)  Secured at (cm) 24 cm 07/15/2014  4:00 AM  Measured From Lips 07/15/2014  4:00 AM  Secured Location Right 07/15/2014  4:00 AM  Secured By [employer] 07/15/2014  4:00 AM  Tube Holder Repositioned Yes 07/15/2014  3:44 AM  Cuff Pressure (cm H2O) 26 cm H2O 07/15/2014  3:44 AM  Site Condition Dry 07/15/2014  4:00 AM     CVC Triple Lumen 07/12/14 Right Subclavian (Active)  Indication for Insertion or Continuance of Line Prolonged intravenous therapies 07/14/2014  8:00 PM  Site Assessment Clean;Dry;Intact 07/14/2014  8:00 PM  Proximal Lumen Status Infusing;Flushed;Capped (Central line);Blood return noted 07/14/2014  8:00 PM  Medial Infusing;Flushed;Capped (Central line);Blood return noted 07/14/2014  8:00 PM  Distal Lumen Status Infusing;Blood return noted 07/14/2014  8:00 PM  Dressing Type Transparent;Occlusive 07/14/2014  8:00 PM  Dressing Status Clean;Dry;Intact 07/14/2014  8:00 PM  Line Care Connections checked and tightened;Zeroed and calibrated 07/14/2014  8:00 PM  Dressing Change Due 07/19/14 07/14/2014  8:00 AM     Arterial Line 07/12/14 Left Radial (Active)  Site Assessment Clean;Dry;Intact 07/14/2014  8:00 PM  Line Status Pulsatile blood flow 07/14/2014  8:00 PM  Art Line Waveform Dampened;Square wave test performed;Appropriate 07/14/2014  8:00 PM  Art Line Interventions Zeroed and calibrated;Leveled;Connections checked and tightened;Flushed per protocol 07/14/2014  8:00 PM  Color/Movement/Sensation Capillary refill less than 3 sec 07/14/2014  8:00 PM  Dressing Type Transparent;Occlusive 07/14/2014  8:00 PM  Dressing Status Clean;Dry;Intact 07/14/2014  8:00 PM  Dressing Change Due 07/19/14 07/14/2014  8:00 PM     NG/OG Tube Orogastric 18 Fr. Right mouth (Active)  Placement Verification  Auscultation 07/15/2014  4:00 AM  Site Assessment Clean;Dry;Intact 07/15/2014  4:00 AM  Status Infusing tube feed;Irrigated 07/15/2014  4:00 AM  Amount of suction 60 mmHg 07/12/2014  7:12 AM  Drainage Appearance Bile 07/13/2014  8:00 AM  Gastric Residual 100 mL 07/15/2014  4:00 AM  Intake (mL) 60 mL 07/15/2014  4:00 AM  Output (mL) 50 mL 07/13/2014 12:00 PM     Ileostomy RLQ (Active)  Ostomy Pouch Intact 07/14/2014  8:00 PM  Stoma Assessment Red 07/14/2014  8:00 PM  Peristomal Assessment Intact 07/14/2014  8:00 PM  Output (mL) 175 mL 07/15/2014  6:00 AM     Urethral Catheter Travis Ringley, RN/ Dr. Grandville Silos Temperature probe 16 Fr. (Active)  Indication for Insertion or Continuance of Catheter Unstable critical patients (first 24-48 hours) 07/14/2014  8:00 PM  Site Assessment Clean;Intact 07/14/2014  8:00 PM  Catheter Maintenance Bag below level of bladder;Catheter secured;Drainage bag/tubing not touching floor;Insertion date on drainage bag;No dependent loops;Seal intact 07/14/2014  8:00 PM  Collection Container Standard drainage bag 07/14/2014  8:00 PM  Securement Method Leg strap 07/14/2014  8:00 PM  Urinary Catheter Interventions Unclamped 07/14/2014  8:00 PM  Output (mL) 175 mL 07/15/2014  6:00 AM    Microbiology/Sepsis markers: Results for orders placed or performed during the hospital encounter of 07/12/14  Blood culture (routine x 2)     Status: None (Preliminary result)   Collection Time: 07/12/14  7:00 AM  Result Value Ref Range Status   Specimen Description BLOOD LEFT SHOULDER  Final   Special Requests BOTTLES DRAWN AEROBIC AND ANAEROBIC 10MLS  Final  Culture  Setup Time   Final    07/12/2014 13:29 Performed at [facility]    Culture   Final           BLOOD CULTURE RECEIVED NO GROWTH TO DATE CULTURE WILL BE HELD FOR 5 DAYS BEFORE ISSUING A FINAL NEGATIVE REPORT Performed at [facility]    Report Status PENDING  Incomplete  Blood culture (routine x 2)     Status:  None (Preliminary result)   Collection Time: 07/12/14  7:23 AM  Result Value Ref Range Status   Specimen Description BLOOD WRIST LEFT  Final   Special Requests BOTTLES DRAWN AEROBIC AND ANAEROBIC 5ML  Final   Culture  Setup Time   Final    07/12/2014 13:29 Performed at [facility]    Culture   Final           BLOOD CULTURE RECEIVED NO GROWTH TO DATE CULTURE WILL BE HELD FOR 5 DAYS BEFORE ISSUING A FINAL NEGATIVE REPORT Performed at [facility]    Report Status PENDING  Incomplete  Urine culture     Status: None   Collection Time: 07/12/14  7:26 AM  Result Value Ref Range Status   Specimen Description URINE, CATHETERIZED  Final   Special Requests Normal  Final   Culture  Setup Time   Final    07/12/2014 13:31 Performed at [place]   Final    15,000 COLONIES/ML Performed at [place] Performed at [facility]   Final   Report Status 07/13/2014 FINAL  Final  MRSA PCR Screening     Status: None   Collection Time: 07/12/14  2:50 PM  Result Value Ref Range Status   MRSA by PCR NEGATIVE NEGATIVE Final    Comment:        The GeneXpert MRSA Assay (FDA approved for NASAL specimens only), is one component of a comprehensive MRSA colonization surveillance program. It is not intended to diagnose MRSA infection nor to guide or monitor treatment for MRSA infections.     Anti-infectives:  Anti-infectives    Start     Dose/Rate Route Frequency Ordered Stop   07/15/14 1430  vancomycin (VANCOCIN) IVPB 1000 mg/200 mL premix     1,000 mg200 mL/hr over 60 Minutes Intravenous Every 24 hours 07/15/14 0959     07/15/14 1100  piperacillin-tazobactam (ZOSYN) IVPB 2.25 g     2.25 g100 mL/hr over 30 Minutes Intravenous 4 times per day 07/15/14 0959     07/15/14 0900  fluconazole (DIFLUCAN) IVPB 100 mg     100 mg50 mL/hr over 60 Minutes Intravenous Every 24 hours 07/14/14 2053 07/19/14 0859   07/14/14 1700   piperacillin-tazobactam (ZOSYN) IVPB 2.25 g  Status:  Discontinued     2.25 g100 mL/hr over 30 Minutes Intravenous Every 8 hours 07/14/14 1529 07/15/14 0959   07/14/14 1430  vancomycin (VANCOCIN) IVPB 1000 mg/200 mL premix  Status:  Discontinued     1,000 mg200 mL/hr over 60 Minutes Intravenous Every 48 hours 07/12/14 1411 07/15/14 0959   07/14/14 0900  fluconazole (DIFLUCAN) IVPB 200 mg  Status:  Discontinued     200 mg100 mL/hr over 60 Minutes Intravenous Every 24 hours 07/14/14 0844 07/14/14 2052   07/13/14 0800  cefTRIAXone (ROCEPHIN) 1 g in dextrose 5 % 50 mL IVPB - Premix  Status:  Discontinued     1 g100 mL/hr over 30 Minutes  Intravenous Every 24 hours 07/12/14 1302 07/12/14 1409   07/12/14 1700  piperacillin-tazobactam (ZOSYN) IVPB 3.375 g  Status:  Discontinued     3.375 g12.5 mL/hr over 240 Minutes Intravenous Every 8 hours 07/12/14 0945 07/14/14 1529   07/12/14 1000  vancomycin (VANCOCIN) IVPB 1000 mg/200 mL premix  Status:  Discontinued     1,000 mg200 mL/hr over 60 Minutes Intravenous Every 48 hours 07/12/14 0935 07/12/14 1411   07/12/14 1000  piperacillin-tazobactam (ZOSYN) IVPB 3.375 g  Status:  Discontinued     3.375 g12.5 mL/hr over 240 Minutes Intravenous Every 8 hours 07/12/14 0935 07/12/14 0944   07/12/14 1000  piperacillin-tazobactam (ZOSYN) IVPB 3.375 g  Status:  Discontinued     3.375 g100 mL/hr over 30 Minutes Intravenous  Once 07/12/14 0944 07/12/14 1408   07/12/14 0815  cefTRIAXone (ROCEPHIN) 2 g in dextrose 5 % 50 mL IVPB  Status:  Discontinued     2 g100 mL/hr over 30 Minutes Intravenous Every 24 hours 07/12/14 0807 07/13/14 1012   07/12/14 0715  piperacillin-tazobactam (ZOSYN) IVPB 3.375 g  Status:  Discontinued     3.375 g100 mL/hr over 30 Minutes Intravenous  Once 07/12/14 0708 07/12/14 0807   07/12/14 0715  vancomycin (VANCOCIN) IVPB 1000 mg/200 mL premix  Status:  Discontinued     1,000 mg200 mL/hr over 60 Minutes Intravenous  Once 07/12/14 0708 07/12/14 6440       Best Practice/Protocols:  VTE Prophylaxis: Mechanical GI Prophylaxis: Proton Pump Inhibitor Continous Sedation  Consults: Treatment Team:  Rozanna Box, MD Alexis Frock, MD Windy Kalata, MD    Events:  Subjective:    Overnight Issues: Pt with pain issues, non compliant with hip precautions.    Objective:  Vital signs for last 24 hours: Temp:  [97.7 F (36.5 C)-100 F (37.8 C)] 100 F (37.8 C) (12/05 0800) Pulse Rate:  [71-99] 77 (12/05 0800) Resp:  [14-21] 16 (12/05 0800) BP: (123-158)/(51-83) 137/64 mmHg (12/05 0800) SpO2:  [90 %-98 %] 97 % (12/05 0800) Arterial Line BP: (105-186)/(40-63) 154/55 mmHg (12/04 2100) FiO2 (%):  [40 %] 40 % (12/04 1800)  Hemodynamic parameters for last 24 hours: CVP:  [9 mmHg-12 mmHg] 10 mmHg  Intake/Output from previous day: 12/04 0701 - 12/05 0700 In: 1798.5 [I.V.:1278.5; NG/GT:70; IV Piggyback:450] Out: 2445 [Urine:2145; Stool:300]  Intake/Output this shift: Total I/O In: 50 [I.V.:50] Out: 125 [Urine:125]  Vent settings for last 24 hours: Vent Mode:  [-] PSV;CPAP FiO2 (%):  [40 %] 40 % PEEP:  [5 cmH20] 5 cmH20 Pressure Support:  [5 cmH20] 5 cmH20  Physical Exam:  General: Pt ornery, but responsive and answers questions, albeit slowly Resp: clear to auscultation bilaterally CVS: regular rate and rhythm, S1, S2 normal, no murmur, click, rub or gallop GI: soft, nontender, BS WNL Extremities: Good palpable pulses, very edematous right thigh, dressing is saturated.  Results for orders placed or performed during the hospital encounter of 07/12/14 (from the past 24 hour(s))  Glucose, capillary     Status: Abnormal   Collection Time: 07/15/14 11:44 AM  Result Value Ref Range   Glucose-Capillary 216 (H) 70 - 99 mg/dL   Comment 1 Capillary Sample   Triglycerides     Status: Abnormal   Collection Time: 07/15/14 12:25 PM  Result Value Ref Range   Triglycerides 158 (H) <150 mg/dL  Glucose, capillary     Status:  Abnormal   Collection Time: 07/15/14  4:04 PM  Result Value Ref Range   Glucose-Capillary  168 (H) 70 - 99 mg/dL   Comment 1 Capillary Sample   Glucose, capillary     Status: Abnormal   Collection Time: 07/15/14  8:09 PM  Result Value Ref Range   Glucose-Capillary 155 (H) 70 - 99 mg/dL   Comment 1 Documented in Chart    Comment 2 Notify RN   Glucose, capillary     Status: Abnormal   Collection Time: 07/16/14 12:35 AM  Result Value Ref Range   Glucose-Capillary 118 (H) 70 - 99 mg/dL   Comment 1 Documented in Chart    Comment 2 Notify RN   Glucose, capillary     Status: Abnormal   Collection Time: 07/16/14  4:20 AM  Result Value Ref Range   Glucose-Capillary 115 (H) 70 - 99 mg/dL   Comment 1 Documented in Chart    Comment 2 Notify RN   Renal function panel     Status: Abnormal   Collection Time: 07/16/14  4:26 AM  Result Value Ref Range   Sodium 146 137 - 147 mEq/L   Potassium 3.7 3.7 - 5.3 mEq/L   Chloride 112 96 - 112 mEq/L   CO2 21 19 - 32 mEq/L   Glucose, Bld 125 (H) 70 - 99 mg/dL   BUN 25 (H) 6 - 23 mg/dL   Creatinine, Ser 2.75 (H) 0.50 - 1.35 mg/dL   Calcium 8.2 (L) 8.4 - 10.5 mg/dL   Phosphorus 2.1 (L) 2.3 - 4.6 mg/dL   Albumin 2.0 (L) 3.5 - 5.2 g/dL   GFR calc non Af Amer 22 (L) >90 mL/min   GFR calc Af Amer 25 (L) >90 mL/min   Anion gap 13 5 - 15     Assessment/Plan:   NEURO  Altered Mental Status:  Having pain issues.   Plan: Switch to dilaudid PRN and add PCA.    PULM  Atelectasis/collapse (bibasilar)   Plan: recheck CXR.    CARDIO  No specific cardiac issues   Plan: CPM  RENAL  Oliguria (possibly due to low CO and but has improved)   Plan: CPM  GI  Advance diet   Plan: Continue tube feedings until ready for extubation  ID  Lower Urinary Tract Infection (canidida albicans and with foley catheter)   Plan: Low colony count ambiguous for infection, being treated  HEME  Anemia acute blood loss anemia and anemia of critical illness)   Plan: No need  for blood now  ENDO No specific issues   Plan: CPM  Global Issues  Renal fuction has signficantly improved.   But stable now.      LOS: 4 days   Additional comments:I reviewed the patient's new clinical lab test results. cbc/bmet and I reviewed the patients new imaging test results.  Critical Care Total Time*: 30 Minutes  Tiler Brandis 07/16/2014  *Care during the described time interval was provided by me and/or other providers on the critical care team.  I have reviewed this patient's available data, including medical history, events of note, physical examination and test results as part of my evaluation.

## 2014-07-17 ENCOUNTER — Inpatient Hospital Stay (HOSPITAL_COMMUNITY): Payer: Medicare PPO

## 2014-07-17 LAB — CBC
HCT: 27.5 % — ABNORMAL LOW (ref 39.0–52.0)
Hemoglobin: 8.9 g/dL — ABNORMAL LOW (ref 13.0–17.0)
MCH: 28.6 pg (ref 26.0–34.0)
MCHC: 32.4 g/dL (ref 30.0–36.0)
MCV: 88.4 fL (ref 78.0–100.0)
Platelets: 159 10*3/uL (ref 150–400)
RBC: 3.11 MIL/uL — ABNORMAL LOW (ref 4.22–5.81)
RDW: 15.4 % (ref 11.5–15.5)
WBC: 10.1 10*3/uL (ref 4.0–10.5)

## 2014-07-17 LAB — GLUCOSE, CAPILLARY
GLUCOSE-CAPILLARY: 124 mg/dL — AB (ref 70–99)
GLUCOSE-CAPILLARY: 127 mg/dL — AB (ref 70–99)
GLUCOSE-CAPILLARY: 135 mg/dL — AB (ref 70–99)
Glucose-Capillary: 118 mg/dL — ABNORMAL HIGH (ref 70–99)
Glucose-Capillary: 122 mg/dL — ABNORMAL HIGH (ref 70–99)
Glucose-Capillary: 135 mg/dL — ABNORMAL HIGH (ref 70–99)
Glucose-Capillary: 173 mg/dL — ABNORMAL HIGH (ref 70–99)

## 2014-07-17 MED ORDER — DORZOLAMIDE HCL-TIMOLOL MAL 2-0.5 % OP SOLN
1.0000 [drp] | Freq: Two times a day (BID) | OPHTHALMIC | Status: DC
  Administered 2014-07-17 – 2014-07-20 (×6): 1 [drp] via OPHTHALMIC
  Filled 2014-07-17: qty 10

## 2014-07-17 MED ORDER — CITRIC ACID-SODIUM CITRATE 334-500 MG/5ML PO SOLN
30.0000 mL | Freq: Three times a day (TID) | ORAL | Status: DC
  Administered 2014-07-17 – 2014-07-22 (×16): 30 mL via ORAL
  Filled 2014-07-17 (×18): qty 30

## 2014-07-17 MED ORDER — STARCH (THICKENING) PO POWD
ORAL | Status: DC | PRN
  Filled 2014-07-17: qty 227

## 2014-07-17 MED ORDER — DOCUSATE SODIUM 100 MG PO CAPS
100.0000 mg | ORAL_CAPSULE | Freq: Every day | ORAL | Status: DC
  Administered 2014-07-17 – 2014-07-22 (×4): 100 mg via ORAL
  Filled 2014-07-17 (×4): qty 1

## 2014-07-17 NOTE — Progress Notes (Signed)
Rehab Admissions Coordinator Note:  Patient was screened by Cleatrice Burke for appropriateness for an Inpatient Acute Rehab Consult per PT recommendation.  At this time, we are recommending Inpatient Rehab consult.  Cleatrice Burke 07/17/2014, 6:39 PM  I can be reached at [phone].
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
## 2014-07-17 NOTE — Plan of Care (Signed)
Problem: Phase II Progression Outcomes Goal: Date pt extubated/weaned off vent Outcome: Completed/Met Date Met:  07/17/14 12/4 Goal: Time pt extubated/weaned off vent Outcome: Completed/Met Date Met:  07/17/14 Goal: Hemodynamically stable Outcome: Completed/Met Date Met:  07/17/14 Goal: Progress activities as ordered Outcome: Progressing Pt working with PT/OT/speech Goal: Tolerating prescribed nutrition plan Outcome: Progressing Goal: Pain controlled Outcome: Completed/Met Date Met:  07/17/14 Much better control today Goal: Code status re-addressed Outcome: Completed/Met Date Met:  07/17/14
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
## 2014-07-17 NOTE — Plan of Care (Signed)
Problem: Phase I Progression Outcomes Goal: VTE prophylaxis Outcome: Completed/Met Date Met:  07/17/14 Goal: GIProphysixis Outcome: Completed/Met Date Met:  07/17/14 Goal: Oral Care per Protocol Outcome: Completed/Met Date Met:  07/17/14 Goal: HOB elevated 30 degrees Outcome: Completed/Met Date Met:  07/17/14 Goal: VAP prevention protocol initiated Outcome: Completed/Met Date Met:  07/17/14 Goal: Sedation Protocol initiated if indicated Outcome: Not Applicable Date Met:  80/69/99 Goal: Pneumonia/flu vaccination screen completed Outcome: Completed/Met Date Met:  07/17/14 Goal: Initiate hyperglycemia protocol as indicated Outcome: Completed/Met Date Met:  07/17/14 Goal: ARDS Protocol initiated if indicated Outcome: Not Applicable Date Met:  67/22/77 Goal: Sepsis Protocol initiated if indicated Outcome: Not Applicable Date Met:  37/50/51 Goal: Code status addressed with pt/family Outcome: Not Met (add Reason) Unable, no family Goal: Pain controlled with appropriate interventions Outcome: Progressing Goal: Hemodynamically stable Outcome: Completed/Met Date Met:  07/17/14 Goal: Baseline oxygen/pH stable Outcome: Completed/Met Date Met:  07/17/14 Goal: Patient tolerating nututrition at goal Outcome: Progressing Swallow eval today, risk for aspiration high with altered mental status Goal: Progressing towards optiumm acitivities Outcome: Progressing PT working with patient, pt now participating more Goal: Patient tolerating weaning plan Outcome: Completed/Met Date Met:  07/17/14 Goal: Tracheostomy by Vent Day 14 Outcome: Not Applicable Date Met:  02/21/51 Goal: Optimized method of communication. Outcome: Not Applicable Date Met:  47/99/80 Goal: Initial discharge plan identified Outcome: Not Progressing Consults placed Goal: Voiding-avoid urinary catheter unless indicated Outcome: Not Met (add Reason)

## 2014-07-17 NOTE — Plan of Care (Signed)
Problem: Phase II Progression Outcomes Goal: Progressing with IS, TCDB Outcome: Completed/Met Date Met:  07/17/14 Goal: Vital signs stable Outcome: Completed/Met Date Met:  07/17/14

## 2014-07-17 NOTE — Plan of Care (Signed)
Problem: Phase I Progression Outcomes Goal: Pain controlled with appropriate interventions Outcome: Completed/Met Date Met:  07/17/14 Goal: OOB as tolerated unless otherwise ordered Outcome: Progressing Goal: Incision/dressings dry and intact Outcome: Completed/Met Date Met:  07/17/14 Goal: Initial discharge plan identified Outcome: Not Met (add Reason) Goal: Voiding-avoid urinary catheter unless indicated Outcome: Not Met (add Reason)  Problem: Consults Goal: Skin Care Protocol Initiated - if Braden Score 18 or less If consults are not indicated, leave blank or document N/A  Outcome: Completed/Met Date Met:  07/17/14 Goal: Nutrition Consult-if indicated Outcome: Completed/Met Date Met:  07/17/14 Goal: Diabetes Guidelines if Diabetic/Glucose > 140 If diabetic or lab glucose is > 140 mg/dl - Initiate Diabetes/Hyperglycemia Guidelines & Document Interventions  Outcome: Completed/Met Date Met:  07/17/14

## 2014-07-17 NOTE — Plan of Care (Signed)
Problem: Phase II Progression Outcomes Goal: Pain controlled Outcome: Completed/Met Date Met:  07/17/14 Goal: Progress activity as tolerated unless otherwise ordered Outcome: Completed/Met Date Met:  07/17/14 Goal: Surgical site without signs of infection Outcome: Completed/Met Date Met:  07/17/14 Goal: Dressings dry/intact Outcome: Completed/Met Date Met:  07/17/14 Goal: Sutures/staples intact Outcome: Completed/Met Date Met:  07/17/14 Goal: Return of bowel function (flatus, BM) IF ABDOMINAL SURGERY:  Outcome: Completed/Met Date Met:  07/17/14
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
## 2014-07-18 ENCOUNTER — Encounter (HOSPITAL_COMMUNITY): Payer: Self-pay | Admitting: Physical Medicine and Rehabilitation

## 2014-07-18 DIAGNOSIS — S2500XS Unspecified injury of thoracic aorta, sequela: Secondary | ICD-10-CM

## 2014-07-18 LAB — GLUCOSE, CAPILLARY
GLUCOSE-CAPILLARY: 85 mg/dL (ref 70–99)
GLUCOSE-CAPILLARY: 89 mg/dL (ref 70–99)
GLUCOSE-CAPILLARY: 186 mg/dL — AB (ref 70–99)
GLUCOSE-CAPILLARY: 127 mg/dL — AB (ref 70–99)
Glucose-Capillary: 104 mg/dL — ABNORMAL HIGH (ref 70–99)
Glucose-Capillary: 125 mg/dL — ABNORMAL HIGH (ref 70–99)

## 2014-07-18 LAB — CULTURE, BLOOD (ROUTINE X 2)
CULTURE: NO GROWTH
Culture: NO GROWTH

## 2014-07-18 LAB — BLOOD PRODUCT ORDER (VERBAL) VERIFICATION

## 2014-07-18 MED ORDER — GLUCERNA SHAKE PO LIQD
237.0000 mL | Freq: Two times a day (BID) | ORAL | Status: DC
  Administered 2014-07-20: 237 mL via ORAL

## 2014-07-18 MED ORDER — IPRATROPIUM-ALBUTEROL 0.5-2.5 (3) MG/3ML IN SOLN
3.0000 mL | Freq: Four times a day (QID) | RESPIRATORY_TRACT | Status: DC | PRN
  Administered 2014-07-18 – 2014-07-19 (×2): 3 mL via RESPIRATORY_TRACT
  Filled 2014-07-18 (×2): qty 3

## 2014-07-18 NOTE — Progress Notes (Signed)
  [facility] Progress Note  07/18/2014 12:56 PM 4 Days Post-Op  Subjective:  Feels like he's progressing "slow"    Filed Vitals:   07/18/14 1204  BP:   Pulse:   Temp: 98.5 F (36.9 C)  Resp:     Physical Exam: Incisions:  Left groin access site clean without hematoma Extremities:  Moving toes, sensation intact. Feet are warm and well perfused  CBC    Component Value Date/Time   WBC 10.1 07/17/2014 0430   RBC 3.11* 07/17/2014 0430   HGB 8.9* 07/17/2014 0430   HCT 27.5* 07/17/2014 0430   PLT 159 07/17/2014 0430   MCV 88.4 07/17/2014 0430   MCH 28.6 07/17/2014 0430   MCHC 32.4 07/17/2014 0430   RDW 15.4 07/17/2014 0430   LYMPHSABS 0.9 07/15/2014 0430   MONOABS 1.4* 07/15/2014 0430   EOSABS 0.1 07/15/2014 0430   BASOSABS 0.0 07/15/2014 0430    BMET    Component Value Date/Time   NA 146 07/16/2014 0426   K 3.7 07/16/2014 0426   CL 112 07/16/2014 0426   CO2 21 07/16/2014 0426   GLUCOSE 125* 07/16/2014 0426   BUN 25* 07/16/2014 0426   CREATININE 2.75* 07/16/2014 0426   CALCIUM 8.2* 07/16/2014 0426   GFRNONAA 22* 07/16/2014 0426   GFRAA 25* 07/16/2014 0426    INR    Component Value Date/Time   INR 1.44 07/12/2014 1315     Intake/Output Summary (Last 24 hours) at 07/18/14 1256 Last data filed at 07/18/14 1200  Gross per 24 hour  Intake 1552.5 ml  Output   2380 ml  Net -827.5 ml     Assessment:  68 y.o. male is s/p: endovascular repair of descending aortic transection   Plan: -Stable from vascular standpoint.     Virgina Jock, PA-C [facility] Office: [phone] Pager: [phone] 07/18/2014 12:56 PM
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
## 2014-07-18 NOTE — Progress Notes (Signed)
Orthopedic Tech Progress Note Patient Details:  Travis Carlson Jan 15, 1946 [id number]      [name] 07/18/2014, 12:57 AM

## 2014-07-18 NOTE — Progress Notes (Signed)
UR completed.  Therapies recommending CIR at d/c and they are following for medical readiness.  Likely to need SNF if CIR doesn't get approved by insurance provider as no family, only a neighbor, is available.   Sandi Mariscal, RN BSN [place] CCM Trauma/Neuro ICU Case Manager [phone]
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
## 2014-07-19 ENCOUNTER — Inpatient Hospital Stay (HOSPITAL_COMMUNITY): Payer: Medicare PPO

## 2014-07-19 LAB — GLUCOSE, CAPILLARY
GLUCOSE-CAPILLARY: 107 mg/dL — AB (ref 70–99)
GLUCOSE-CAPILLARY: 136 mg/dL — AB (ref 70–99)
GLUCOSE-CAPILLARY: 217 mg/dL — AB (ref 70–99)
Glucose-Capillary: 97 mg/dL (ref 70–99)
Glucose-Capillary: 187 mg/dL — ABNORMAL HIGH (ref 70–99)
Glucose-Capillary: 260 mg/dL — ABNORMAL HIGH (ref 70–99)

## 2014-07-19 LAB — BASIC METABOLIC PANEL
ANION GAP: 14 (ref 5–15)
BUN: 23 mg/dL (ref 6–23)
CO2: 23 mEq/L (ref 19–32)
Calcium: 8.6 mg/dL (ref 8.4–10.5)
Chloride: 111 mEq/L (ref 96–112)
Creatinine, Ser: 2.41 mg/dL — ABNORMAL HIGH (ref 0.50–1.35)
GFR, EST AFRICAN AMERICAN: 30 mL/min — AB (ref >90)
GFR, EST NON AFRICAN AMERICAN: 26 mL/min — AB (ref >90)
Glucose, Bld: 103 mg/dL — ABNORMAL HIGH (ref 70–99)
POTASSIUM: 3.3 meq/L — AB (ref 3.7–5.3)
SODIUM: 148 meq/L — AB (ref 137–147)

## 2014-07-19 LAB — CBC
HCT: 28.8 % — ABNORMAL LOW (ref 39.0–52.0)
Hemoglobin: 9.1 g/dL — ABNORMAL LOW (ref 13.0–17.0)
MCH: 27.2 pg (ref 26.0–34.0)
MCHC: 31.6 g/dL (ref 30.0–36.0)
MCV: 86 fL (ref 78.0–100.0)
Platelets: 260 10*3/uL (ref 150–400)
RBC: 3.35 MIL/uL — ABNORMAL LOW (ref 4.22–5.81)
RDW: 14.8 % (ref 11.5–15.5)
WBC: 8.1 10*3/uL (ref 4.0–10.5)

## 2014-07-19 MED ORDER — FUROSEMIDE 10 MG/ML IJ SOLN
20.0000 mg | Freq: Two times a day (BID) | INTRAMUSCULAR | Status: AC
  Administered 2014-07-19 (×2): 20 mg via INTRAVENOUS
  Filled 2014-07-19 (×2): qty 2

## 2014-07-19 MED ORDER — ENOXAPARIN SODIUM 40 MG/0.4ML ~~LOC~~ SOLN
40.0000 mg | SUBCUTANEOUS | Status: DC
  Administered 2014-07-19 – 2014-07-21 (×3): 40 mg via SUBCUTANEOUS
  Filled 2014-07-19 (×4): qty 0.4

## 2014-07-19 MED ORDER — POTASSIUM CHLORIDE CRYS ER 20 MEQ PO TBCR
40.0000 meq | EXTENDED_RELEASE_TABLET | Freq: Two times a day (BID) | ORAL | Status: AC
  Administered 2014-07-19: 40 meq via ORAL
  Filled 2014-07-19: qty 2

## 2014-07-19 MED ORDER — PHENAZOPYRIDINE HCL 200 MG PO TABS
200.0000 mg | ORAL_TABLET | Freq: Three times a day (TID) | ORAL | Status: DC
  Administered 2014-07-19 – 2014-07-20 (×2): 200 mg via ORAL
  Filled 2014-07-19 (×6): qty 1
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
## 2014-07-19 NOTE — Clinical Social Work Note (Signed)
Met with patient to discuss going to a SNF for short term rehab once he medically ready for discharge.  Patient is in agreement to going to SNF for rehab, patient is in [facility] and states he does not have family nearby and only has friends.  Patient did not have anyone else he wanted CSW to talk to right now.  Jones Broom. [place], MSW, [place] 07/19/2014 4:27 PM
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
## 2014-07-19 NOTE — Progress Notes (Signed)
Pt will need SNF rehab , not inpt rehab. He is not expected to be able to progress to complete independence to return home alone with limited resources after a brief rehab stay. I have alerted SW and RN [place]. [phone]

## 2014-07-19 NOTE — Consult Note (Signed)
WOC ostomy follow up Contacted by bedside nurse with concerns of peristomal skin denudation, however upon assessment it appears to be MARSI (medical adhesive related injury) from removal of an ostomy pouch at some point.  Stoma type/location: RLQ, end ileostomy Stomal assessment/size: slightly prolapsed, pink and moist Peristomal assessment:  Intact, slight bit of folliculitis noted from 4-6 o'clock and the distal edge of the skin barrier had an area of partial thickness skin loss from medical adhesive removal.  Treatment options for stomal/peristomal skin:  Covered area of skin damage at the distal edge of the wafer with hydrocolloid prior to placement of ostomy wafer Output liquid brown-green  Ostomy pouching: 2pc. 2 3/4" wafer and pouch placed, used 2" barrier around the stoma to elevate the prolapsed portion a bit over and into the pouch.  Education provided: pt has been independent in his ostomy care and reports he normally uses a 2pc.  Updated ostomy care orders and supplies ordered for use in the room.    WOC will shadow for ostomy needs Para March RN,CWOCN [phone]
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
## 2014-07-19 NOTE — Plan of Care (Signed)
Problem: Phase I Progression Outcomes Goal: OOB as tolerated unless otherwise ordered Outcome: Completed/Met Date Met:  07/19/14     

## 2014-07-19 NOTE — Plan of Care (Signed)
Problem: Phase II Progression Outcomes Goal: Foley discontinued Outcome: Not Progressing Goal: Tolerating diet Outcome: Progressing

## 2014-07-20 LAB — CBC
HCT: 30.4 % — ABNORMAL LOW (ref 39.0–52.0)
Hemoglobin: 9.7 g/dL — ABNORMAL LOW (ref 13.0–17.0)
MCH: 27.6 pg (ref 26.0–34.0)
MCHC: 31.9 g/dL (ref 30.0–36.0)
MCV: 86.4 fL (ref 78.0–100.0)
PLATELETS: 323 10*3/uL (ref 150–400)
RBC: 3.52 MIL/uL — AB (ref 4.22–5.81)
RDW: 14.7 % (ref 11.5–15.5)
WBC: 9.5 10*3/uL (ref 4.0–10.5)

## 2014-07-20 LAB — GLUCOSE, CAPILLARY
GLUCOSE-CAPILLARY: 67 mg/dL — AB (ref 70–99)
GLUCOSE-CAPILLARY: 172 mg/dL — AB (ref 70–99)
GLUCOSE-CAPILLARY: 151 mg/dL — AB (ref 70–99)
Glucose-Capillary: 77 mg/dL (ref 70–99)
Glucose-Capillary: 148 mg/dL — ABNORMAL HIGH (ref 70–99)
Glucose-Capillary: 160 mg/dL — ABNORMAL HIGH (ref 70–99)
Glucose-Capillary: 132 mg/dL — ABNORMAL HIGH (ref 70–99)

## 2014-07-20 LAB — BASIC METABOLIC PANEL
ANION GAP: 13 (ref 5–15)
BUN: 22 mg/dL (ref 6–23)
CO2: 27 meq/L (ref 19–32)
CREATININE: 2.34 mg/dL — AB (ref 0.50–1.35)
Calcium: 8.7 mg/dL (ref 8.4–10.5)
Chloride: 106 mEq/L (ref 96–112)
GFR calc Af Amer: 31 mL/min — ABNORMAL LOW (ref >90)
GFR calc non Af Amer: 27 mL/min — ABNORMAL LOW (ref >90)
GLUCOSE: 86 mg/dL (ref 70–99)
Potassium: 3.4 mEq/L — ABNORMAL LOW (ref 3.7–5.3)
Sodium: 146 mEq/L (ref 137–147)

## 2014-07-20 MED ORDER — POTASSIUM CHLORIDE 10 MEQ/50ML IV SOLN
10.0000 meq | INTRAVENOUS | Status: AC
  Administered 2014-07-20 (×3): 10 meq via INTRAVENOUS
  Filled 2014-07-20 (×3): qty 50

## 2014-07-20 MED ORDER — DORZOLAMIDE HCL-TIMOLOL MAL 2-0.5 % OP SOLN
1.0000 [drp] | Freq: Two times a day (BID) | OPHTHALMIC | Status: DC
  Administered 2014-07-20 – 2014-07-22 (×4): 1 [drp] via OPHTHALMIC

## 2014-07-20 NOTE — Clinical Social Work Placement (Addendum)
Clinical Social Work Department CLINICAL SOCIAL WORK PLACEMENT NOTE 07/20/2014  Patient:  Travis Carlson, Travis Carlson  Account Number:  [id number] Admit date:  07/12/2014  Clinical Social Worker:  ERIC ANTERHAUS, LCSWA  Date/time:  07/20/2014 04:36 PM  Clinical Social Work is seeking post-discharge placement for this patient at the following level of care:   SKILLED NURSING   (*CSW will update this form in Epic as items are completed)   07/19/2014  Patient/family provided with [facility] Department of Clinical Social Work's list of facilities offering this level of care within the geographic area requested by the patient (or if unable, by the patient's family).  07/19/2014  Patient/family informed of their freedom to choose among providers that offer the needed level of care, that participate in Medicare, Medicaid or managed care program needed by the patient, have an available bed and are willing to accept the patient.  07/19/2014  Patient/family informed of [facility]' ownership interest in [facility], as well as of the fact that they are under no obligation to receive care at this facility.  PASARR submitted to EDS on 07/19/2014 PASARR number received on 07/19/2014  FL2 transmitted to all facilities in geographic area requested by pt/family on  07/20/2014 FL2 transmitted to all facilities within larger geographic area on 07/20/2014  Patient informed that his/her managed care company has contracts with or will negotiate with  certain facilities, including the following:     Patient/family informed of bed offers received:  07/22/2014 Lubertha Sayres, Latanya Presser) Patient chooses bed at [facility] Lubertha Sayres, [place]) Physician recommends and patient chooses bed at    Patient to be transferred to  [facility] on   07/22/2014 Lubertha Sayres, [facility]) Patient to be transferred to facility by [place] Lubertha Sayres, [place]) Patient and family notified of  transfer on 07/22/2014 Lubertha Sayres, Latanya Presser) Name of family member notified:  Pt notified at bedside [facility] [place])  The following physician request were entered in Epic:   Additional Comments:   Jones Broom. Norval Morton, MSW, [place] 07/20/2014 4:38 PM   Lubertha Sayres, Latanya Presser ([phone]) Licensed Clinical Social Worker Orthopedics [phone]) and Surgical [phone])

## 2014-07-20 NOTE — Progress Notes (Signed)
Inpatient Diabetes Program Recommendations  AACE/ADA: New Consensus Statement on Inpatient Glycemic Control (2013)  Target Ranges:  Prepandial:   less than 140 mg/dL      Peak postprandial:   less than 180 mg/dL (1-2 hours)      Critically ill patients:  140 - 180 mg/dL   Reason for Assessment: Results for Travis Carlson, Travis Carlson (MRN [id number]) as of 07/20/2014 12:27  Ref. Range 07/19/2014 19:25 07/19/2014 23:44 07/20/2014 03:52 07/20/2014 04:45 07/20/2014 07:30  Glucose-Capillary Latest Range: 70-99 mg/dL 260 (H) 136 (H) 67 (L) 77 148 (H)    Current hospital medications:  Levemir 10 units q HS, Novolog moderate q 4 hours  Consider changing Novolog correction to tid with meals and HS scale. Also may consider adding Novolog 3 units tid with meals-hold if patient eats less than 50%.   Thanks, Adah Perl, RN, BC-ADM Inpatient Diabetes Coordinator Pager [phone]
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
## 2014-07-20 NOTE — Clinical Social Work Psychosocial (Signed)
Clinical Social Work Department BRIEF PSYCHOSOCIAL ASSESSMENT 07/19/2014  Patient:  Travis Carlson, Travis Carlson     Account Number:  [id number]     Admit date:  07/12/2014  Clinical Social Worker:  Dian Queen  Date/Time:  07/19/2014 04:27 PM  Referred by:  Physician  Date Referred:  07/19/2014 Referred for  SNF Placement   Other Referral:   Interview type:  Patient Other interview type:    PSYCHOSOCIAL DATA Living Status:  ALONE Admitted from facility:   Level of care:   Primary support name:  Travis Carlson Primary support relationship to patient:  NONE Degree of support available:   Patient has a friend who can help him but patient says he does not have anyone else.    CURRENT CONCERNS Current Concerns  Post-Acute Placement   Other Concerns:    SOCIAL WORK ASSESSMENT / PLAN Patient is a 68 year old male who lives by himself. Patient was alert and oriented x3 but in some pain. Patient states he does not really have any family he talks to, but does have a friend who helps him with things. Patient was asked about going to a SNF for short term rehab, and he is in agreement to going.  Patient will go to a SNf for short term rehab once he is medically ready and discharge orders have been given.   Assessment/plan status:   Other assessment/ plan:   Information/referral to community resources:    PATIENT'S/FAMILY'S RESPONSE TO PLAN OF CARE: Patient in agreement to going to a SNF for short term rehab.    Jones Broom. [place], MSW, [place] 07/20/2014 4:36 PM
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
## 2014-07-20 NOTE — Progress Notes (Signed)
Hypoglycemic Event  CBG: 67  Treatment: 15 GM carbohydrate snack  Symptoms: None  Follow-up CBG: [id number]  Result: 77  Possible Reasons for Event: Inadequate meal intake  Comments/MD notified:  Pt showed no signs or symptoms of hypoglycemia.  Will continue to monitor pt closely.    Archie Endo L  Remember to initiate Hypoglycemia Order Set & complete
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
## 2014-07-21 ENCOUNTER — Inpatient Hospital Stay (HOSPITAL_COMMUNITY): Payer: Medicare PPO

## 2014-07-21 LAB — GLUCOSE, CAPILLARY
GLUCOSE-CAPILLARY: 156 mg/dL — AB (ref 70–99)
Glucose-Capillary: 111 mg/dL — ABNORMAL HIGH (ref 70–99)
Glucose-Capillary: 158 mg/dL — ABNORMAL HIGH (ref 70–99)
Glucose-Capillary: 211 mg/dL — ABNORMAL HIGH (ref 70–99)

## 2014-07-21 MED ORDER — BELLADONNA ALKALOIDS-OPIUM 16.2-60 MG RE SUPP: 1.0000 | Freq: Four times a day (QID) | RECTAL | Status: DC | PRN

## 2014-07-21 MED ORDER — SODIUM CHLORIDE 0.9 % IJ SOLN: 10.0000 mL | Freq: Two times a day (BID) | INTRAMUSCULAR | Status: DC

## 2014-07-21 MED ORDER — SODIUM CHLORIDE 0.9 % IJ SOLN
10.0000 mL | INTRAMUSCULAR | Status: DC | PRN
  Administered 2014-07-21 (×2): 10 mL via INTRAVENOUS
  Filled 2014-07-21 (×2): qty 10

## 2014-07-21 MED ORDER — SODIUM CHLORIDE 0.9 % IV SOLN: INTRAVENOUS | Status: DC

## 2014-07-21 MED ORDER — POTASSIUM CHLORIDE CRYS ER 20 MEQ PO TBCR
20.0000 meq | EXTENDED_RELEASE_TABLET | Freq: Two times a day (BID) | ORAL | Status: AC
  Administered 2014-07-21 (×2): 20 meq via ORAL
  Filled 2014-07-21 (×2): qty 1
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
## 2014-07-21 NOTE — Progress Notes (Signed)
   [facility]  Subjective  - Doing better.   Objective 141/46 64 99 F (37.2 C) (Oral) 16 93%  Intake/Output Summary (Last 24 hours) at 07/21/14 0741 Last data filed at 07/21/14 0700  Gross per 24 hour  Intake 1291.67 ml  Output   3985 ml  Net -2693.33 ml    Left groin soft at incision stick site Palpable left PT and right DP   Assessment/Planning: 07/12/2014 TVAR Stable from vascular point of view    Laurence Slate [facility] 07/21/2014 7:41 AM --  Laboratory Lab Results:  Recent Labs  07/19/14 0500 07/20/14 0455  WBC 8.1 9.5  HGB 9.1* 9.7*  HCT 28.8* 30.4*  PLT 260 323   BMET  Recent Labs  07/19/14 0500 07/20/14 0455  NA 148* 146  K 3.3* 3.4*  CL 111 106  CO2 23 27  GLUCOSE 103* 86  BUN 23 22  CREATININE 2.41* 2.34*  CALCIUM 8.6 8.7    COAG Lab Results  Component Value Date   INR 1.44 07/12/2014   INR 1.20 07/12/2014   No results found for: PTT

## 2014-07-21 NOTE — Plan of Care (Signed)
Problem: Phase I Progression Outcomes Goal: Code status addressed with pt/family Outcome: Not Applicable Date Met:  97/53/00 Goal: Pain controlled with appropriate interventions Outcome: Completed/Met Date Met:  07/21/14 Goal: Patient tolerating nututrition at goal Outcome: Completed/Met Date Met:  07/21/14 Goal: Progressing towards optiumm acitivities Outcome: Completed/Met Date Met:  07/21/14 Goal: Initial discharge plan identified Outcome: Completed/Met Date Met:  07/21/14 To discharge to SNF Goal: Voiding-avoid urinary catheter unless indicated Outcome: Not Applicable Date Met:  51/10/21 Goal: Other Phase I Outcomes/Goals Outcome: Completed/Met Date Met:  07/21/14  Problem: Phase II Progression Outcomes Goal: Progress activities as ordered Outcome: Completed/Met Date Met:  07/21/14 Goal: Tolerating prescribed nutrition plan Outcome: Completed/Met Date Met:  07/21/14 Goal: Discharge/transfer plan updated Outcome: Completed/Met Date Met:  07/21/14 Goal: Other Phase II Outcomes/Goals Outcome: Completed/Met Date Met:  07/21/14  Problem: Phase III Progression Outcomes Goal: Transfer/discharge plan in place Outcome: Completed/Met Date Met:  07/21/14 Goal: Pain controlled with appropriate interventions Outcome: Completed/Met Date Met:  07/21/14 Goal: Hemodynamically stable Outcome: Completed/Met Date Met:  07/21/14 Goal: Activity advanced as tolerated Outcome: Completed/Met Date Met:  07/21/14 Goal: Nutritional plan in place Outcome: Completed/Met Date Met:  07/21/14 Goal: Changed to appropriate Path within 48 hrs extubation Outcome: Completed/Met Date Met:  11/73/56 Goal: Complications resolved/controlled Outcome: Completed/Met Date Met:  07/21/14 Goal: Barriers To Progression Addressed/Resolved Outcome: Completed/Met Date Met:  07/21/14 Goal: Other Phase III Outcomes/Goals Outcome: Completed/Met Date Met:  07/21/14
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
## 2014-07-21 NOTE — Clinical Social Work Note (Signed)
Patient was moved from 2S03 to 6N29, handoff was given to assigned CSW [facility] informed her that CIR is also looking at patient, this CSW to sign off.  Jones Broom. [place], MSW, [employer] 07/21/2014 2:16 PM

## 2014-07-21 NOTE — Plan of Care (Signed)
Problem: Phase I Progression Outcomes Goal: Initial discharge plan identified Outcome: Completed/Met Date Met:  07/21/14 To discharge to SNF    Goal: Voiding-avoid urinary catheter unless indicated Outcome: Not Applicable Date Met:  50/87/19 Order to continue foley  Problem: Phase II Progression Outcomes Goal: Foley discontinued Outcome: Not Applicable Date Met:  94/12/90 Has Bladder CA.  To follow-up with Urology concerning when to D/C foley Goal: Discharge plan established Outcome: Completed/Met Date Met:  07/21/14 To Discharge to SNF when medically ready. Goal: Tolerating diet Outcome: Completed/Met Date Met:  07/21/14 Goal: Other Phase II Outcomes/Goals Outcome: Completed/Met Date Met:  07/21/14

## 2014-07-22 DIAGNOSIS — N179 Acute kidney failure, unspecified: Secondary | ICD-10-CM | POA: Diagnosis present

## 2014-07-22 DIAGNOSIS — D494 Neoplasm of unspecified behavior of bladder: Secondary | ICD-10-CM | POA: Diagnosis present

## 2014-07-22 DIAGNOSIS — N189 Chronic kidney disease, unspecified: Secondary | ICD-10-CM | POA: Diagnosis present

## 2014-07-22 DIAGNOSIS — E876 Hypokalemia: Secondary | ICD-10-CM | POA: Diagnosis not present

## 2014-07-22 LAB — BASIC METABOLIC PANEL
Anion gap: 11 (ref 5–15)
BUN: 22 mg/dL (ref 6–23)
CO2: 27 mEq/L (ref 19–32)
CREATININE: 2.06 mg/dL — AB (ref 0.50–1.35)
Calcium: 8.4 mg/dL (ref 8.4–10.5)
Chloride: 106 mEq/L (ref 96–112)
GFR, EST AFRICAN AMERICAN: 36 mL/min — AB (ref >90)
GFR, EST NON AFRICAN AMERICAN: 31 mL/min — AB (ref >90)
Glucose, Bld: 105 mg/dL — ABNORMAL HIGH (ref 70–99)
Potassium: 3.5 mEq/L — ABNORMAL LOW (ref 3.7–5.3)
Sodium: 144 mEq/L (ref 137–147)

## 2014-07-22 LAB — GLUCOSE, CAPILLARY
GLUCOSE-CAPILLARY: 95 mg/dL (ref 70–99)
GLUCOSE-CAPILLARY: 98 mg/dL (ref 70–99)
Glucose-Capillary: 121 mg/dL — ABNORMAL HIGH (ref 70–99)
Glucose-Capillary: 157 mg/dL — ABNORMAL HIGH (ref 70–99)
Glucose-Capillary: 125 mg/dL — ABNORMAL HIGH (ref 70–99)
Glucose-Capillary: 152 mg/dL — ABNORMAL HIGH (ref 70–99)

## 2014-07-22 MED ORDER — ENOXAPARIN SODIUM 30 MG/0.3ML ~~LOC~~ SOLN
30.0000 mg | Freq: Two times a day (BID) | SUBCUTANEOUS | Status: DC
  Administered 2014-07-22: 30 mg via SUBCUTANEOUS
  Filled 2014-07-22: qty 0.3

## 2014-07-22 MED ORDER — DIAZEPAM 5 MG PO TABS: 5.0000 mg | ORAL_TABLET | Freq: Three times a day (TID) | ORAL | Status: DC | PRN

## 2014-07-22 MED ORDER — POLYETHYLENE GLYCOL 3350 17 G PO PACK
17.0000 g | PACK | Freq: Every day | ORAL | Status: DC
  Administered 2014-07-22: 17 g via ORAL
  Filled 2014-07-22: qty 1

## 2014-07-22 MED ORDER — TRAMADOL HCL 50 MG PO TABS
100.0000 mg | ORAL_TABLET | Freq: Four times a day (QID) | ORAL | Status: DC
  Administered 2014-07-22 (×3): 100 mg via ORAL
  Filled 2014-07-22 (×3): qty 2

## 2014-07-22 MED ORDER — ENOXAPARIN SODIUM 30 MG/0.3ML ~~LOC~~ SOLN: 30.0000 mg | Freq: Two times a day (BID) | SUBCUTANEOUS | Status: DC

## 2014-07-22 MED ORDER — HYDROMORPHONE HCL 1 MG/ML IJ SOLN
0.5000 mg | INTRAMUSCULAR | Status: DC | PRN
  Administered 2014-07-22: 0.5 mg via INTRAVENOUS
  Filled 2014-07-22: qty 1

## 2014-07-22 MED ORDER — TRAMADOL HCL 50 MG PO TABS
100.0000 mg | ORAL_TABLET | Freq: Four times a day (QID) | ORAL | Status: DC
Start: 2014-07-22 — End: 2016-05-21

## 2014-07-22 MED ORDER — INSULIN DETEMIR 100 UNIT/ML ~~LOC~~ SOLN: 10.0000 [IU] | Freq: Every day | SUBCUTANEOUS | Status: DC

## 2014-07-22 MED ORDER — OXYCODONE HCL 5 MG PO TABS
5.0000 mg | ORAL_TABLET | ORAL | Status: DC | PRN
  Administered 2014-07-22: 15 mg via ORAL
  Filled 2014-07-22: qty 2
  Filled 2014-07-22: qty 3

## 2014-07-22 MED ORDER — INSULIN ASPART 100 UNIT/ML ~~LOC~~ SOLN: 0.0000 [IU] | Freq: Three times a day (TID) | SUBCUTANEOUS | Status: DC

## 2014-07-22 MED ORDER — INSULIN ASPART 100 UNIT/ML ~~LOC~~ SOLN
0.0000 [IU] | Freq: Three times a day (TID) | SUBCUTANEOUS | Status: DC
  Administered 2014-07-22: 3 [IU] via SUBCUTANEOUS
  Administered 2014-07-22: 2 [IU] via SUBCUTANEOUS
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
## 2014-07-22 NOTE — Progress Notes (Addendum)
Report called to [facility] receiving RN at [facility]. At [place] here to transport to SNF

## 2014-07-22 NOTE — Clinical Social Work Note (Signed)
Pt to be discharged to [facility]. Pt updated at bedside.  Facility: [facility] Report number: [phone] Transportation: EMS ([address] [phone])  Lubertha Sayres, [place] [phone]) Licensed Clinical Social Worker Orthopedics [phone]) and Surgical [phone])
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
## 2014-07-22 NOTE — Progress Notes (Signed)
Medicare IM (Important Message) delivered to patient today by me in anticipation of discharge.   Harue Pribble, RN BSN MHA CCM  Case Manager, Trauma Service/Unit 3M [phone]  

## 2014-07-22 NOTE — Discharge Instructions (Signed)
Orthopaedic Trauma Service Discharge Instructions   General Discharge Instructions  WEIGHT BEARING STATUS: Weightbearing as tolerated Right leg  RANGE OF MOTION/ACTIVITY: R trochanteric hip precatuions (no active abduction) and R posterior hip precautions  Wound Care: daily dry dressing changes as needed    STOP SMOKING OR USING NICOTINE PRODUCTS!!!!  As discussed nicotine severely impairs your body's ability to heal surgical and traumatic wounds but also impairs bone healing.  Wounds and bone heal by forming microscopic blood vessels (angiogenesis) and nicotine is a vasoconstrictor (essentially, shrinks blood vessels).  Therefore, if vasoconstriction occurs to these microscopic blood vessels they essentially disappear and are unable to deliver necessary nutrients to the healing tissue.  This is one modifiable factor that you can do to dramatically increase your chances of healing your injury.    (This means no smoking, no nicotine gum, patches, etc)  DO NOT USE NONSTEROIDAL ANTI-INFLAMMATORY DRUGS (NSAID'S)  Using products such as Advil (ibuprofen), Aleve (naproxen), Motrin (ibuprofen) for additional pain control during fracture healing can delay and/or prevent the healing response.  If you would like to take over the counter (OTC) medication, Tylenol (acetaminophen) is ok.  However, some narcotic medications that are given for pain control contain acetaminophen as well. Therefore, you should not exceed more than 4000 mg of tylenol in a day if you do not have liver disease.  Also note that there are may OTC medicines, such as cold medicines and allergy medicines that my contain tylenol as well.  If you have any questions about medications and/or interactions please ask your doctor/PA or your pharmacist.   PAIN MEDICATION USE AND EXPECTATIONS  You have likely been given narcotic medications to help control your pain.  After a traumatic event that results in an fracture (broken bone) with or  without surgery, it is ok to use narcotic pain medications to help control one's pain.  We understand that everyone responds to pain differently and each individual patient will be evaluated on a regular basis for the continued need for narcotic medications. Ideally, narcotic medication use should last no more than 6-8 weeks (coinciding with fracture healing).   As a patient it is your responsibility as well to monitor narcotic medication use and report the amount and frequency you use these medications when you come to your office visit.   We would also advise that if you are using narcotic medications, you should take a dose prior to therapy to maximize you participation.  IF YOU ARE ON NARCOTIC MEDICATIONS IT IS NOT PERMISSIBLE TO OPERATE A MOTOR VEHICLE (MOTORCYCLE/CAR/TRUCK/MOPED) OR HEAVY MACHINERY DO NOT MIX NARCOTICS WITH OTHER CNS (CENTRAL NERVOUS SYSTEM) DEPRESSANTS SUCH AS ALCOHOL       ICE AND ELEVATE INJURED/OPERATIVE EXTREMITY  Using ice and elevating the injured extremity above your heart can help with swelling and pain control.  Icing in a pulsatile fashion, such as 20 minutes on and 20 minutes off, can be followed.    Do not place ice directly on skin. Make sure there is a barrier between to skin and the ice pack.    Using frozen items such as frozen peas works well as the conform nicely to the are that needs to be iced.  USE AN ACE WRAP OR TED HOSE FOR SWELLING CONTROL  In addition to icing and elevation, Ace wraps or TED hose are used to help limit and resolve swelling.  It is recommended to use Ace wraps or TED hose until you are informed to stop.  When using Ace Wraps start the wrapping distally (farthest away from the body) and wrap proximally (closer to the body)   Example: If you had surgery on your leg or thing and you do not have a splint on, start the ace wrap at the toes and work your way up to the thigh        If you had surgery on your upper extremity and do not  have a splint on, start the ace wrap at your fingers and work your way up to the upper arm  IF YOU ARE IN A SPLINT OR CAST DO NOT [place]   If your splint gets wet for any reason please contact the office immediately. You may shower in your splint or cast as long as you keep it dry.  This can be done by wrapping in a cast cover or garbage back (or similar)  Do Not stick any thing down your splint or cast such as pencils, money, or hangers to try and scratch yourself with.  If you feel itchy take benadryl as prescribed on the bottle for itching  IF YOU ARE IN A CAM BOOT (BLACK BOOT)  You may remove boot periodically. Perform daily dressing changes as noted below.  Wash the liner of the boot regularly and wear a sock when wearing the boot. It is recommended that you sleep in the boot until told otherwise  CALL THE OFFICE WITH ANY QUESTIONS OR CONCERTS: [phone]       Discharge Wound Care Instructions  Do NOT apply any ointments, solutions or lotions to pin sites or surgical wounds.  These prevent needed drainage and even though solutions like hydrogen peroxide kill bacteria, they also damage cells lining the pin sites that help fight infection.  Applying lotions or ointments can keep the wounds moist and can cause them to breakdown and open up as well. This can increase the risk for infection. When in doubt call the office.  Surgical incisions should be dressed daily.  If any drainage is noted, use one layer of adaptic, then gauze, Kerlix, and an ace wrap.  Once the incision is completely dry and without drainage, it may be left open to air out.  Showering may begin 36-48 hours later.  Cleaning gently with soap and water.  Traumatic wounds should be dressed daily as well.    One layer of adaptic, gauze, Kerlix, then ace wrap.  The adaptic can be discontinued once the draining has ceased    If you have a wet to dry dressing: wet the gauze with saline the squeeze as much  saline out so the gauze is moist (not soaking wet), place moistened gauze over wound, then place a dry gauze over the moist one, followed by Kerlix wrap, then ace wrap.

## 2015-08-23 ENCOUNTER — Inpatient Hospital Stay: Admit: 2015-08-23 | Discharge: 2015-08-23 | Disposition: A | Discharge status: Home / Self Care | Payer: Self-pay

## 2015-09-26 IMAGING — CR DG CHEST 1V PORT
1 series · 1 of 1 positions shown · non-contrast
Comparison: 07/14/2014

CLINICAL DATA: Rib fractures

EXAM:
PORTABLE CHEST - 1 VIEW

[AP]
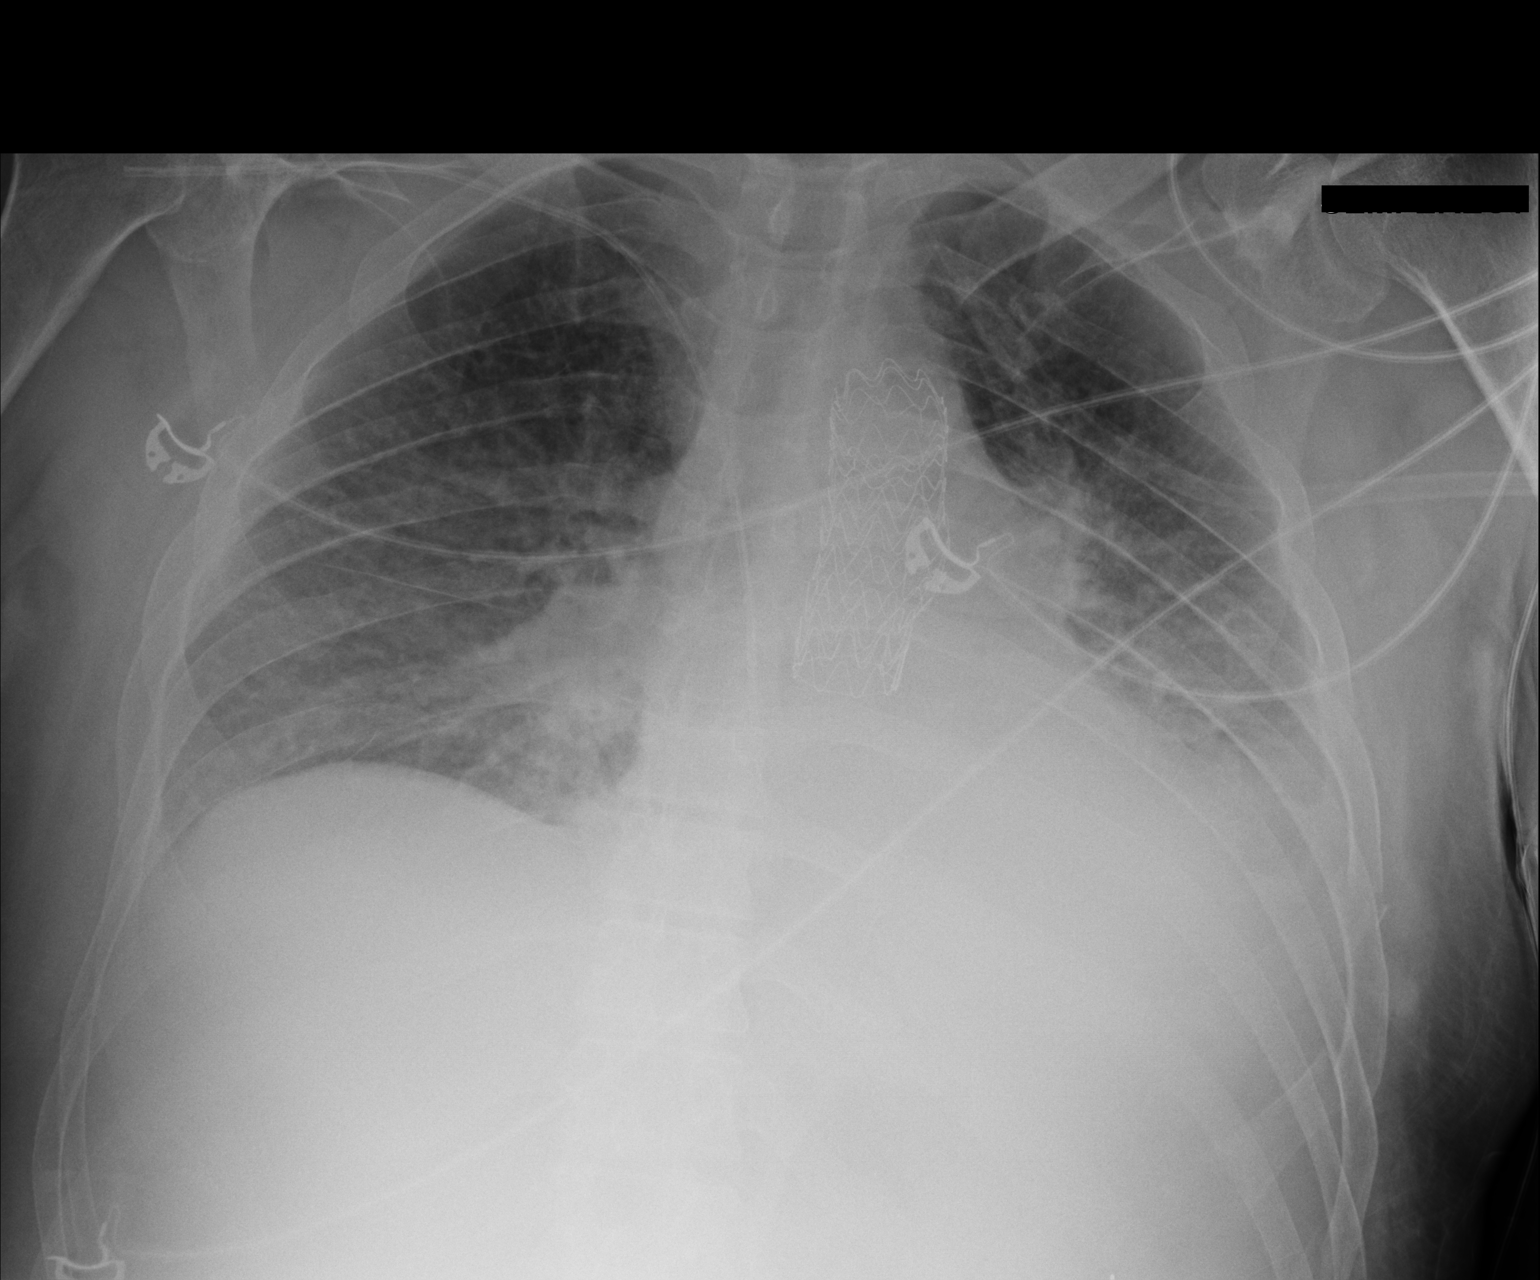

[1 of 1 positions shown; findings below may reference images not displayed]

FINDINGS: Endotracheal and nasogastric tubes have been removed. Aortic stent
graft in place. Borderline cardiomegaly persists with persistent
retrocardiac opacity and hilar fullness likely reflecting venous
congestion. No pneumothorax. Right subclavian approach central line
tip terminates over the right atrium. Left lateral rib fractures
reidentified.
IMPRESSION: Persistent retrocardiac opacity which could indicate pneumonia
although atelectasis could appear similar.

Cardiomegaly with hilar fullness likely indicating vascular
congestion.

## 2015-09-27 IMAGING — CR DG CHEST 1V PORT
1 series · 1 of 1 positions shown · non-contrast
Comparison: 07/16/2014

CLINICAL DATA: Difficulty breathing

EXAM:
PORTABLE CHEST - 1 VIEW

[AP]
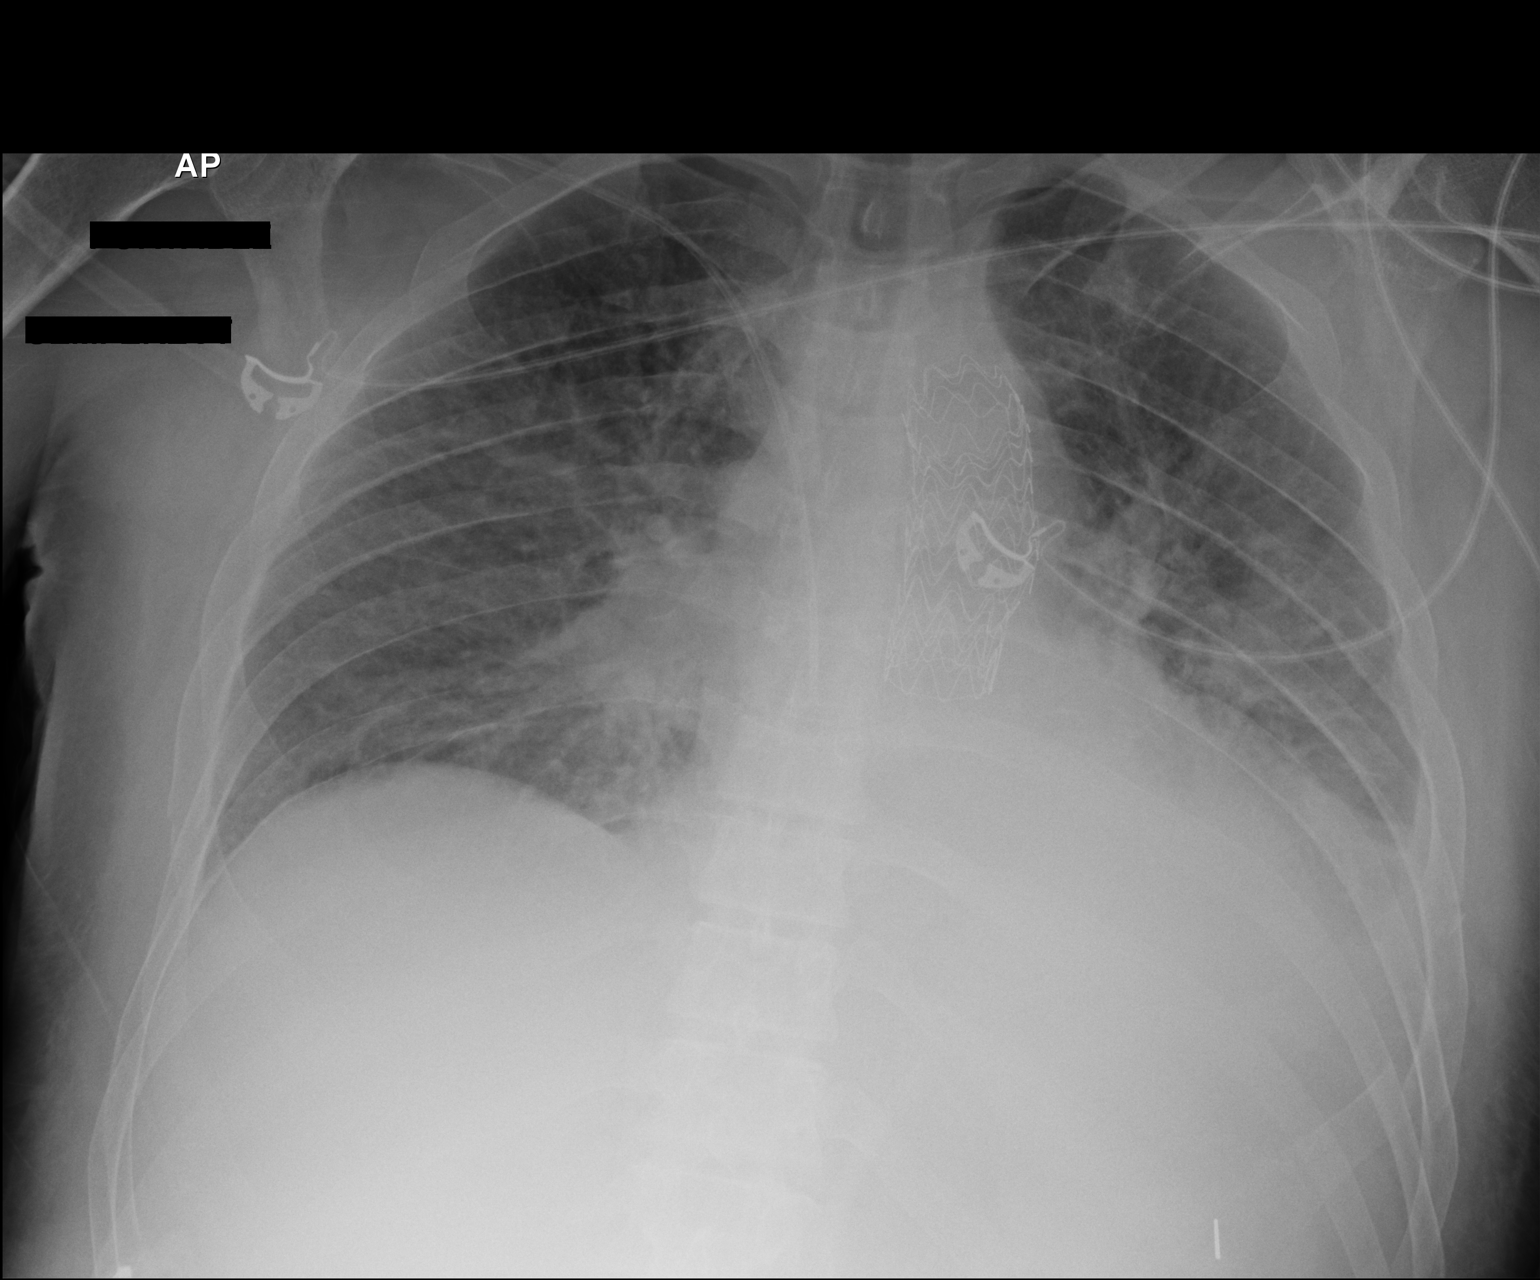

[1 of 1 positions shown; findings below may reference images not displayed]

FINDINGS: Right-sided PICC line with the tip projecting over the SVC.

Thoracic aortic stent graft noted. Bilateral diffuse interstitial
thickening. Small left pleural effusion. Left retrocardiac airspace
disease likely reflecting atelectasis. Right perihilar enlargement,
unchanged from the prior examination but enlarged compared with
07/13/2014. Stable cardiomegaly. No acute osseous abnormality.
IMPRESSION: 1. Overall findings concerning for pulmonary edema. Left
retrocardiac airspace opacity likely reflecting compressive
atelectasis given the adjacent small pleural effusion. Pneumonia
cannot be excluded.
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
## 2015-10-09 DIAGNOSIS — Z932 Ileostomy status: Secondary | ICD-10-CM | POA: Insufficient documentation

## 2015-12-24 DIAGNOSIS — H401123 Primary open-angle glaucoma, left eye, severe stage: Secondary | ICD-10-CM | POA: Insufficient documentation

## 2016-01-22 ENCOUNTER — Inpatient Hospital Stay (HOSPITAL_COMMUNITY)
Admission: EM | Admit: 2016-01-22 | Discharge: 2016-01-31 | DRG: 628 | Disposition: A | Discharge status: Home / Self Care | Payer: Medicare Other | Attending: Internal Medicine | Admitting: Internal Medicine

## 2016-01-22 ENCOUNTER — Emergency Department (HOSPITAL_COMMUNITY): Payer: Medicare Other

## 2016-01-22 ENCOUNTER — Encounter (HOSPITAL_COMMUNITY): Payer: Self-pay | Admitting: Emergency Medicine

## 2016-01-22 DIAGNOSIS — H5441 Blindness, right eye, normal vision left eye: Secondary | ICD-10-CM | POA: Diagnosis present

## 2016-01-22 DIAGNOSIS — E8889 Other specified metabolic disorders: Secondary | ICD-10-CM | POA: Diagnosis present

## 2016-01-22 DIAGNOSIS — Z8546 Personal history of malignant neoplasm of prostate: Secondary | ICD-10-CM

## 2016-01-22 DIAGNOSIS — Z794 Long term (current) use of insulin: Secondary | ICD-10-CM | POA: Diagnosis not present

## 2016-01-22 DIAGNOSIS — E875 Hyperkalemia: Secondary | ICD-10-CM | POA: Diagnosis present

## 2016-01-22 DIAGNOSIS — E119 Type 2 diabetes mellitus without complications: Secondary | ICD-10-CM

## 2016-01-22 DIAGNOSIS — Z992 Dependence on renal dialysis: Secondary | ICD-10-CM | POA: Diagnosis not present

## 2016-01-22 DIAGNOSIS — Z9049 Acquired absence of other specified parts of digestive tract: Secondary | ICD-10-CM | POA: Diagnosis not present

## 2016-01-22 DIAGNOSIS — I120 Hypertensive chronic kidney disease with stage 5 chronic kidney disease or end stage renal disease: Secondary | ICD-10-CM | POA: Diagnosis present

## 2016-01-22 DIAGNOSIS — Z87891 Personal history of nicotine dependence: Secondary | ICD-10-CM

## 2016-01-22 DIAGNOSIS — D494 Neoplasm of unspecified behavior of bladder: Secondary | ICD-10-CM | POA: Diagnosis present

## 2016-01-22 DIAGNOSIS — E1122 Type 2 diabetes mellitus with diabetic chronic kidney disease: Secondary | ICD-10-CM | POA: Diagnosis present

## 2016-01-22 DIAGNOSIS — N185 Chronic kidney disease, stage 5: Secondary | ICD-10-CM | POA: Diagnosis not present

## 2016-01-22 DIAGNOSIS — I953 Hypotension of hemodialysis: Secondary | ICD-10-CM | POA: Diagnosis not present

## 2016-01-22 DIAGNOSIS — R531 Weakness: Secondary | ICD-10-CM

## 2016-01-22 DIAGNOSIS — Z87440 Personal history of urinary (tract) infections: Secondary | ICD-10-CM | POA: Diagnosis not present

## 2016-01-22 DIAGNOSIS — Z883 Allergy status to other anti-infective agents status: Secondary | ICD-10-CM

## 2016-01-22 DIAGNOSIS — D638 Anemia in other chronic diseases classified elsewhere: Secondary | ICD-10-CM | POA: Diagnosis present

## 2016-01-22 DIAGNOSIS — N390 Urinary tract infection, site not specified: Secondary | ICD-10-CM | POA: Diagnosis present

## 2016-01-22 DIAGNOSIS — D509 Iron deficiency anemia, unspecified: Secondary | ICD-10-CM | POA: Diagnosis present

## 2016-01-22 DIAGNOSIS — N179 Acute kidney failure, unspecified: Secondary | ICD-10-CM | POA: Diagnosis present

## 2016-01-22 DIAGNOSIS — D696 Thrombocytopenia, unspecified: Secondary | ICD-10-CM | POA: Diagnosis present

## 2016-01-22 DIAGNOSIS — Z800 Family history of malignant neoplasm of digestive organs: Secondary | ICD-10-CM

## 2016-01-22 DIAGNOSIS — Z9079 Acquired absence of other genital organ(s): Secondary | ICD-10-CM | POA: Diagnosis not present

## 2016-01-22 DIAGNOSIS — I959 Hypotension, unspecified: Secondary | ICD-10-CM | POA: Diagnosis not present

## 2016-01-22 DIAGNOSIS — Z96641 Presence of right artificial hip joint: Secondary | ICD-10-CM | POA: Diagnosis present

## 2016-01-22 DIAGNOSIS — Z79899 Other long term (current) drug therapy: Secondary | ICD-10-CM | POA: Diagnosis not present

## 2016-01-22 DIAGNOSIS — N2581 Secondary hyperparathyroidism of renal origin: Secondary | ICD-10-CM | POA: Diagnosis present

## 2016-01-22 DIAGNOSIS — R000 Tachycardia, unspecified: Secondary | ICD-10-CM

## 2016-01-22 DIAGNOSIS — Z933 Colostomy status: Secondary | ICD-10-CM

## 2016-01-22 DIAGNOSIS — N186 End stage renal disease: Secondary | ICD-10-CM | POA: Diagnosis present

## 2016-01-22 DIAGNOSIS — I10 Essential (primary) hypertension: Secondary | ICD-10-CM | POA: Diagnosis present

## 2016-01-22 HISTORY — DX: End stage renal disease: N18.6

## 2016-01-22 HISTORY — DX: Type 2 diabetes mellitus without complications: E11.9

## 2016-01-22 LAB — COMPREHENSIVE METABOLIC PANEL
ALBUMIN: 3.5 g/dL (ref 3.5–5.0)
ALK PHOS: 92 U/L (ref 38–126)
ALT: 13 U/L — ABNORMAL LOW (ref 17–63)
AST: 30 U/L (ref 15–41)
Anion gap: 13 (ref 5–15)
BILIRUBIN TOTAL: 1.3 mg/dL — AB (ref 0.3–1.2)
BUN: 39 mg/dL — AB (ref 6–20)
CHLORIDE: 102 mmol/L (ref 101–111)
CO2: 20 mmol/L — ABNORMAL LOW (ref 22–32)
CREATININE: 5.59 mg/dL — AB (ref 0.61–1.24)
Calcium: 9 mg/dL (ref 8.9–10.3)
GFR, EST AFRICAN AMERICAN: 11 mL/min — AB (ref >60)
GFR, EST NON AFRICAN AMERICAN: 9 mL/min — AB (ref >60)
Glucose, Bld: 98 mg/dL (ref 65–99)
Potassium: 5.9 mmol/L — ABNORMAL HIGH (ref 3.5–5.1)
Sodium: 135 mmol/L (ref 135–145)
TOTAL PROTEIN: 7.4 g/dL (ref 6.5–8.1)

## 2016-01-22 LAB — BASIC METABOLIC PANEL
ANION GAP: 9 (ref 5–15)
Anion gap: 9 (ref 5–15)
BUN: 38 mg/dL — AB (ref 6–20)
BUN: 39 mg/dL — AB (ref 6–20)
CALCIUM: 8.8 mg/dL — AB (ref 8.9–10.3)
CALCIUM: 8.6 mg/dL — AB (ref 8.9–10.3)
CO2: 24 mmol/L (ref 22–32)
CO2: 27 mmol/L (ref 22–32)
CREATININE: 5.81 mg/dL — AB (ref 0.61–1.24)
Chloride: 99 mmol/L — ABNORMAL LOW (ref 101–111)
Chloride: 99 mmol/L — ABNORMAL LOW (ref 101–111)
Creatinine, Ser: 6.06 mg/dL — ABNORMAL HIGH (ref 0.61–1.24)
GFR calc Af Amer: 10 mL/min — ABNORMAL LOW (ref >60)
GFR calc Af Amer: 10 mL/min — ABNORMAL LOW (ref >60)
GFR calc non Af Amer: 8 mL/min — ABNORMAL LOW (ref >60)
GFR, EST NON AFRICAN AMERICAN: 9 mL/min — AB (ref >60)
GLUCOSE: 96 mg/dL (ref 65–99)
GLUCOSE: 249 mg/dL — AB (ref 65–99)
Potassium: 6.9 mmol/L (ref 3.5–5.1)
Potassium: 5.4 mmol/L — ABNORMAL HIGH (ref 3.5–5.1)
Sodium: 132 mmol/L — ABNORMAL LOW (ref 135–145)
Sodium: 135 mmol/L (ref 135–145)

## 2016-01-22 LAB — CBG MONITORING, ED
GLUCOSE-CAPILLARY: 246 mg/dL — AB (ref 65–99)
GLUCOSE-CAPILLARY: 219 mg/dL — AB (ref 65–99)
Glucose-Capillary: 93 mg/dL (ref 65–99)

## 2016-01-22 LAB — URINALYSIS, ROUTINE W REFLEX MICROSCOPIC
Bilirubin Urine: NEGATIVE
Glucose, UA: NEGATIVE mg/dL
KETONES UR: NEGATIVE mg/dL
NITRITE: NEGATIVE
Protein, ur: 100 mg/dL — AB
SPECIFIC GRAVITY, URINE: 1.015 (ref 1.005–1.030)
pH: 7.5 (ref 5.0–8.0)

## 2016-01-22 LAB — I-STAT CHEM 8, ED
BUN: 38 mg/dL — ABNORMAL HIGH (ref 6–20)
Calcium, Ion: 1.11 mmol/L — ABNORMAL LOW (ref 1.13–1.30)
Chloride: 101 mmol/L (ref 101–111)
Creatinine, Ser: 5.6 mg/dL — ABNORMAL HIGH (ref 0.61–1.24)
Glucose, Bld: 100 mg/dL — ABNORMAL HIGH (ref 65–99)
HEMATOCRIT: 33 % — AB (ref 39.0–52.0)
HEMOGLOBIN: 11.2 g/dL — AB (ref 13.0–17.0)
POTASSIUM: 5.2 mmol/L — AB (ref 3.5–5.1)
SODIUM: 135 mmol/L (ref 135–145)
TCO2: 24 mmol/L (ref 0–100)

## 2016-01-22 LAB — CBC WITH DIFFERENTIAL/PLATELET
BASOS ABS: 0.1 10*3/uL (ref 0.0–0.1)
Band Neutrophils: 0 %
Basophils Relative: 1 %
Blasts: 0 %
EOS PCT: 5 %
Eosinophils Absolute: 0.6 10*3/uL (ref 0.0–0.7)
HCT: 32.2 % — ABNORMAL LOW (ref 39.0–52.0)
HEMOGLOBIN: 9.9 g/dL — AB (ref 13.0–17.0)
LYMPHS ABS: 2.8 10*3/uL (ref 0.7–4.0)
Lymphocytes Relative: 22 %
MCH: 26.5 pg (ref 26.0–34.0)
MCHC: 30.7 g/dL (ref 30.0–36.0)
MCV: 86.1 fL (ref 78.0–100.0)
METAMYELOCYTES PCT: 0 %
MYELOCYTES: 0 %
Monocytes Absolute: 0.4 10*3/uL (ref 0.1–1.0)
Monocytes Relative: 3 %
NEUTROS PCT: 69 %
Neutro Abs: 8.6 10*3/uL — ABNORMAL HIGH (ref 1.7–7.7)
Other: 0 %
PLATELETS: 48 10*3/uL — AB (ref 150–400)
Promyelocytes Absolute: 0 %
RBC: 3.74 MIL/uL — ABNORMAL LOW (ref 4.22–5.81)
RDW: 15 % (ref 11.5–15.5)
WBC: 12.5 10*3/uL — ABNORMAL HIGH (ref 4.0–10.5)
nRBC: 0 /100 WBC

## 2016-01-22 LAB — URINE MICROSCOPIC-ADD ON
BACTERIA UA: NONE SEEN
Squamous Epithelial / LPF: NONE SEEN

## 2016-01-22 LAB — MAGNESIUM: MAGNESIUM: 1.8 mg/dL (ref 1.7–2.4)

## 2016-01-22 LAB — I-STAT TROPONIN, ED: TROPONIN I, POC: 0.01 ng/mL (ref 0.00–0.08)

## 2016-01-22 MED ORDER — INSULIN GLARGINE 100 UNIT/ML ~~LOC~~ SOLN
30.0000 [IU] | Freq: Every day | SUBCUTANEOUS | Status: DC
  Administered 2016-01-22: 30 [IU] via SUBCUTANEOUS
  Filled 2016-01-22 (×2): qty 0.3

## 2016-01-22 MED ORDER — DORZOLAMIDE HCL-TIMOLOL MAL 2-0.5 % OP SOLN
1.0000 [drp] | Freq: Two times a day (BID) | OPHTHALMIC | Status: DC
  Administered 2016-01-23 – 2016-01-30 (×13): 1 [drp] via OPHTHALMIC
  Filled 2016-01-22 (×4): qty 10

## 2016-01-22 MED ORDER — INSULIN ASPART 100 UNIT/ML ~~LOC~~ SOLN: 10.0000 [IU] | Freq: Three times a day (TID) | SUBCUTANEOUS | Status: DC

## 2016-01-22 MED ORDER — INSULIN ASPART 100 UNIT/ML ~~LOC~~ SOLN
0.0000 [IU] | Freq: Three times a day (TID) | SUBCUTANEOUS | Status: DC
  Administered 2016-01-23 (×2): 3 [IU] via SUBCUTANEOUS
  Administered 2016-01-24 (×2): 5 [IU] via SUBCUTANEOUS
  Administered 2016-01-24 – 2016-01-25 (×2): 2 [IU] via SUBCUTANEOUS
  Administered 2016-01-25: 11 [IU] via SUBCUTANEOUS
  Administered 2016-01-26: 5 [IU] via SUBCUTANEOUS
  Administered 2016-01-27: 3 [IU] via SUBCUTANEOUS
  Administered 2016-01-27 – 2016-01-28 (×3): 5 [IU] via SUBCUTANEOUS
  Administered 2016-01-28 – 2016-01-29 (×2): 2 [IU] via SUBCUTANEOUS
  Administered 2016-01-29: 5 [IU] via SUBCUTANEOUS
  Administered 2016-01-30: 8 [IU] via SUBCUTANEOUS

## 2016-01-22 MED ORDER — SODIUM CHLORIDE 0.9 % IV BOLUS (SEPSIS)
1000.0000 mL | Freq: Once | INTRAVENOUS | Status: AC
  Administered 2016-01-22: 1000 mL via INTRAVENOUS

## 2016-01-22 MED ORDER — DEXTROSE 50 % IV SOLN
1.0000 | Freq: Once | INTRAVENOUS | Status: AC
  Administered 2016-01-22: 50 mL via INTRAVENOUS
  Filled 2016-01-22: qty 50

## 2016-01-22 MED ORDER — SODIUM CHLORIDE 0.9 % IV SOLN
INTRAVENOUS | Status: DC
  Administered 2016-01-22: 1000 mL via INTRAVENOUS

## 2016-01-22 MED ORDER — INSULIN GLARGINE 100 UNIT/ML ~~LOC~~ SOLN
30.0000 [IU] | Freq: Every day | SUBCUTANEOUS | Status: DC
  Filled 2016-01-22: qty 0.3

## 2016-01-22 MED ORDER — HEPARIN SODIUM (PORCINE) 5000 UNIT/ML IJ SOLN
5000.0000 [IU] | Freq: Three times a day (TID) | INTRAMUSCULAR | Status: DC
Start: 2016-01-22 — End: 2016-01-23
  Administered 2016-01-22 – 2016-01-23 (×2): 5000 [IU] via SUBCUTANEOUS
  Filled 2016-01-22 (×2): qty 1

## 2016-01-22 MED ORDER — SODIUM BICARBONATE 8.4 % IV SOLN
50.0000 meq | Freq: Once | INTRAVENOUS | Status: AC
  Administered 2016-01-22: 50 meq via INTRAVENOUS
  Filled 2016-01-22: qty 50

## 2016-01-22 MED ORDER — INSULIN ASPART 100 UNIT/ML IV SOLN
10.0000 [IU] | Freq: Once | INTRAVENOUS | Status: AC
  Administered 2016-01-22: 10 [IU] via INTRAVENOUS
  Filled 2016-01-22 (×3): qty 1

## 2016-01-22 MED ORDER — SODIUM CHLORIDE 0.9 % IV SOLN
1.0000 g | Freq: Once | INTRAVENOUS | Status: AC
  Administered 2016-01-22: 1 g via INTRAVENOUS
  Filled 2016-01-22: qty 10

## 2016-01-22 NOTE — ED Notes (Signed)
Pt here with sudden onset weakness at 1315. Pt was receiving dialysis treatment without taking any fluid off. Pt was on his first dialysis treatment- he was recently at [facility] and dx with "kidney problems"

## 2016-01-22 NOTE — ED Notes (Signed)
MD at bedside. 

## 2016-01-22 NOTE — ED Notes (Signed)
Dr. Jared at bedside. 

## 2016-01-22 NOTE — ED Provider Notes (Signed)
CSN: [id number]     Arrival date & time 01/22/16  1427 History   First MD Initiated Contact with Patient 01/22/16 1431     Chief Complaint  Patient presents with  . Weakness    HPI  Jeter Berkebile is an 70 y.o. male with CKD stage IV, prostate cancer s/p TURP (2015), FAP s/p ileostomy, overactive bladder s/p interstim neuromodulation (2017) who presents to the ED for evaluation of generalized weakness. He states he was at dialysis today (today was his first dialysis session ever) when he suddenly felt generally weak and fatigued around 1:30 PM. He states he does not know how long he had been at dialysis for. Pt was reportedly below his dry weight so they were not planning on taking off any fluid at HD. He states he did not feel any focal weakness, numbness, or tingling. He states he did not feel lightheaded or like he was going to pass out. Denies chest pain or SOB. Denies abdominal pain, nausea, vomiting. Denies ostomy problems. He states in the ED now he continues to feel low energy and weak.   On EMR review of Care Everywhere, pt was discharged from [facility] a couple weeks ago after an admission for acute on chronic kidney failure with likely viral gastroenteritis. While there he had a RIJ cath placed with plans to transition to dialysis. His first dialysis session was today. He states he lives by himself at home in [facility] ([facility]   Past Medical History  Diagnosis Date  . 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 CKD (chronic kidney disease)   . History of benign colon tumor    Past Surgical History  Procedure Laterality Date  . Colectomy    . Thoracic aortic endovascular stent graft N/A 07/12/2014    Procedure: THORACIC AORTIC ENDOVASCULAR STENT GRAFT;  Surgeon: Serafina Mitchell, MD;  Location: [place];  Service: Vascular;  Laterality: N/A;  . Hip arthroplasty Right 07/14/2014    Procedure: ARTHROPLASTY BIPOLAR HIP;  Surgeon: Rozanna Box, MD;  Location: [place];  Service: Orthopedics;   Laterality: Right;   No family history on file. Social History  Substance Use Topics  . Smoking status: Former Smoker    Types: Cigarettes  . Smokeless tobacco: Not on file  . Alcohol Use: No    Review of Systems  All other systems reviewed and are negative.     Allergies  Bactrim  Home Medications   Prior to Admission medications   Medication Sig Start Date End Date Taking? Authorizing Provider  diazepam (VALIUM) 5 MG tablet Take 1 tablet (5 mg total) by mouth every 8 (eight) hours as needed. 07/22/14   Lisette Abu, PA-C  dorzolamide-timolol (COSOPT) 22.3-6.8 MG/ML ophthalmic solution Place 1 drop into the left eye 2 (two) times daily. 04/19/14   Historical Provider, MD  enoxaparin (LOVENOX) 30 MG/0.3ML injection Inject 0.3 mLs (30 mg total) into the skin every 12 (twelve) hours. 07/22/14 08/08/14  Ainsley Spinner, PA-C  insulin aspart (NOVOLOG) 100 UNIT/ML injection Inject 0-15 Units into the skin 3 (three) times daily with meals. 07/22/14   Lisette Abu, PA-C  insulin detemir (LEVEMIR) 100 UNIT/ML injection Inject 0.1 mLs (10 Units total) into the skin at bedtime. 07/22/14   Lisette Abu, PA-C  traMADol (ULTRAM) 50 MG tablet Take 2 tablets (100 mg total) by mouth every 6 (six) hours. 07/22/14   Lisette Abu, PA-C   BP 109/60 mmHg  Pulse  68  Temp(Src) 97.8 F (36.6 C) (Oral)  Resp 17  SpO2 100% Physical Exam  Constitutional:  Chronically ill appearing  HENT:  Right Ear: External ear normal.  Left Ear: External ear normal.  Nose: Nose normal.  Mouth/Throat: Oropharynx is clear and moist. No oropharyngeal exudate.  MM dry  Eyes: Conjunctivae and EOM are normal. Pupils are equal, round, and reactive to light.  Neck: Normal range of motion. Neck supple.  Cardiovascular: Normal rate, regular rhythm, normal heart sounds and intact distal pulses.   Pulmonary/Chest: Effort normal and breath sounds normal. No respiratory distress. He has no wheezes. He exhibits  no tenderness.  Abdominal: Soft. Bowel sounds are normal. He exhibits no distension. There is no tenderness. There is no rebound and no guarding.  Ostomy in place, unremarkable  Musculoskeletal: He exhibits no edema.  Neurological: He is alert. No cranial nerve deficit.  Oriented to person. Does not know what city. States he "wasn't paying attention" when EMS took him here. Is able to state he lives in [place]. States the year is 2000-something but cannot specify. He states he hasn't "kept track" due to all of his medical problems going on. States he does not know the month but it is summertime.  Normal finger to nose No pronator drift Moves all extremities freely  Skin: Skin is warm and dry. There is pallor.  Psychiatric: He has a normal mood and affect.  Nursing note and vitals reviewed.   ED Course  Procedures (including critical care time) Labs Review Labs Reviewed  COMPREHENSIVE METABOLIC PANEL - Abnormal; Notable for the following:    Potassium 5.9 (*)    CO2 20 (*)    BUN 39 (*)    Creatinine, Ser 5.59 (*)    ALT 13 (*)    Total Bilirubin 1.3 (*)    GFR calc non Af Amer 9 (*)    GFR calc Af Amer 11 (*)    All other components within normal limits  CBC WITH DIFFERENTIAL/PLATELET - Abnormal; Notable for the following:    WBC 12.5 (*)    RBC 3.74 (*)    Hemoglobin 9.9 (*)    HCT 32.2 (*)    Platelets 48 (*)    Neutro Abs 8.6 (*)    All other components within normal limits  URINALYSIS, ROUTINE W REFLEX MICROSCOPIC (NOT AT [facility]) - Abnormal; Notable for the following:    APPearance TURBID (*)    Hgb urine dipstick SMALL (*)    Protein, ur 100 (*)    Leukocytes, UA LARGE (*)    All other components within normal limits  BASIC METABOLIC PANEL - Abnormal; Notable for the following:    Sodium 132 (*)    Potassium 6.9 (*)    Chloride 99 (*)    BUN 38 (*)    Creatinine, Ser 5.81 (*)    Calcium 8.8 (*)    GFR calc non Af Amer 9 (*)    GFR calc Af Amer 10 (*)     All other components within normal limits  I-STAT CHEM 8, ED - Abnormal; Notable for the following:    Potassium 5.2 (*)    BUN 38 (*)    Creatinine, Ser 5.60 (*)    Glucose, Bld 100 (*)    Calcium, Ion 1.11 (*)    Hemoglobin 11.2 (*)    HCT 33.0 (*)    All other components within normal limits  URINE CULTURE  URINE MICROSCOPIC-ADD ON  CBG MONITORING,  ED  Randolm Idol, ED    Imaging Review Dg Chest 2 View  01/22/2016  CLINICAL DATA:  Acute onset weakness and lightheadedness at 1:15 p.m. today while undergoing dialysis. EXAM: CHEST  2 VIEW COMPARISON:  Single-view of the chest 09/01/2014 and CT chest 07/12/2014. FINDINGS: Dialysis catheter is noted. Aortic stent graft is in place. The lungs are clear. Heart size is normal. No pneumothorax or pleural effusion. IMPRESSION: No acute disease. Electronically Signed   By: Inge Rise M.D.   On: 01/22/2016 15:11   I have personally reviewed and evaluated these images and lab results as part of my medical decision-making.   ED ECG REPORT   Date: 01/22/2016  Rate: 68  Rhythm: normal sinus rhythm  QRS Axis: normal  Intervals: normal  ST/T Wave abnormalities: nonspecific ST changes  Conduction Disutrbances:right bundle branch block  Old EKG Reviewed: unchanged  I have personally reviewed the EKG tracing and agree with the computerized printout as noted.   MDM   Final diagnoses:  Hyperkalemia  Generalized weakness    i-stat chem 8 reveals K+ of 5.2 but CMP with K+ 5.9. Will re-check BMP. Labs otherwise with Cr of 5.6, WBC 12.5, UA with large leukocytes. Pt recently completed keflex therapy for presumed UTI. Will send for culture. 1L NS bolus given in the ED.   Repeat BMP with K+ of 6.9. Unclear etiology in this sudden rise. Discussed with attending Dr. Lacinda Axon. We will initiate insulin/d50, bicarb, and calcium therapy in the ED and call for a medical admission.  I spoke with Dr. Alcario Drought of the hospitalist service who will  admit pt to medicine. We will also consult nephrology for dialysis.     Anne Ng, PA-C 01/22/16 1929  I spoke with Dr. Justin Mend who will plan to dialyze in the AM. Please call him with results of repeat K+ level tonight.   Anne Ng, PA-C 01/22/16 [place], DO 01/24/16 2300
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
## 2016-01-22 NOTE — ED Notes (Signed)
Patient asked for urine . States that he is unable to give urine sample at this time.

## 2016-01-23 DIAGNOSIS — R531 Weakness: Secondary | ICD-10-CM | POA: Insufficient documentation

## 2016-01-23 DIAGNOSIS — E875 Hyperkalemia: Secondary | ICD-10-CM | POA: Insufficient documentation

## 2016-01-23 DIAGNOSIS — N390 Urinary tract infection, site not specified: Secondary | ICD-10-CM

## 2016-01-23 LAB — CBC
HEMATOCRIT: 30.8 % — AB (ref 39.0–52.0)
HEMOGLOBIN: 9.3 g/dL — AB (ref 13.0–17.0)
MCH: 26.1 pg (ref 26.0–34.0)
MCHC: 30.2 g/dL (ref 30.0–36.0)
MCV: 86.5 fL (ref 78.0–100.0)
Platelets: 63 10*3/uL — ABNORMAL LOW (ref 150–400)
RBC: 3.56 MIL/uL — AB (ref 4.22–5.81)
RDW: 15.2 % (ref 11.5–15.5)
WBC: 9.4 10*3/uL (ref 4.0–10.5)

## 2016-01-23 LAB — BASIC METABOLIC PANEL
ANION GAP: 9 (ref 5–15)
BUN: 43 mg/dL — AB (ref 6–20)
CHLORIDE: 102 mmol/L (ref 101–111)
CO2: 23 mmol/L (ref 22–32)
Calcium: 8.3 mg/dL — ABNORMAL LOW (ref 8.9–10.3)
Creatinine, Ser: 6.33 mg/dL — ABNORMAL HIGH (ref 0.61–1.24)
GFR calc Af Amer: 9 mL/min — ABNORMAL LOW (ref >60)
GFR, EST NON AFRICAN AMERICAN: 8 mL/min — AB (ref >60)
GLUCOSE: 222 mg/dL — AB (ref 65–99)
POTASSIUM: 5.2 mmol/L — AB (ref 3.5–5.1)
Sodium: 134 mmol/L — ABNORMAL LOW (ref 135–145)

## 2016-01-23 LAB — POTASSIUM
POTASSIUM: 4.9 mmol/L (ref 3.5–5.1)
Potassium: 4.4 mmol/L (ref 3.5–5.1)

## 2016-01-23 LAB — GLUCOSE, CAPILLARY
GLUCOSE-CAPILLARY: 263 mg/dL — AB (ref 65–99)
GLUCOSE-CAPILLARY: 191 mg/dL — AB (ref 65–99)
GLUCOSE-CAPILLARY: 199 mg/dL — AB (ref 65–99)
GLUCOSE-CAPILLARY: 98 mg/dL (ref 65–99)
GLUCOSE-CAPILLARY: 255 mg/dL — AB (ref 65–99)

## 2016-01-23 LAB — FIBRINOGEN: Fibrinogen: 526 mg/dL — ABNORMAL HIGH (ref 204–475)

## 2016-01-23 LAB — URINE CULTURE: Culture: NO GROWTH

## 2016-01-23 LAB — PROTIME-INR
INR: 1.09 (ref 0.00–1.49)
Prothrombin Time: 14.3 seconds (ref 11.6–15.2)

## 2016-01-23 LAB — VITAMIN B12: Vitamin B-12: 275 pg/mL (ref 180–914)

## 2016-01-23 LAB — APTT: aPTT: 30 seconds (ref 24–37)

## 2016-01-23 MED ORDER — DEXTROSE 5 % IV SOLN
1.0000 g | INTRAVENOUS | Status: DC
Start: 2016-01-23 — End: 2016-01-28
  Administered 2016-01-23 – 2016-01-28 (×6): 1 g via INTRAVENOUS
  Filled 2016-01-23 (×9): qty 10

## 2016-01-23 MED ORDER — DARBEPOETIN ALFA 25 MCG/0.42ML IJ SOSY
25.0000 ug | PREFILLED_SYRINGE | INTRAMUSCULAR | Status: DC
  Administered 2016-01-24: 25 ug via INTRAVENOUS
  Filled 2016-01-23: qty 0.42

## 2016-01-23 MED ORDER — SODIUM CHLORIDE 0.9 % IV SOLN
125.0000 mg | INTRAVENOUS | Status: DC
  Administered 2016-01-24: 125 mg via INTRAVENOUS
  Filled 2016-01-23 (×4): qty 10

## 2016-01-23 MED ORDER — INSULIN GLARGINE 100 UNIT/ML ~~LOC~~ SOLN
25.0000 [IU] | Freq: Every day | SUBCUTANEOUS | Status: DC
  Administered 2016-01-23 – 2016-01-30 (×7): 25 [IU] via SUBCUTANEOUS
  Filled 2016-01-23 (×10): qty 0.25
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
## 2016-01-23 NOTE — Consult Note (Signed)
[facility] Renal Consultation Note    Indication for Consultation:  Management of ESRD/hemodialysis; anemia, hypertension/volume and secondary hyperparathyroidism PCP:  HPI: Travis Carlson is a 70 y.o. 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 PMH significant for Barrett's esophagus, DMT2, Familial adenomatous polyposis S/P ileostomy, prostate cancer S/P TURP 2015, recently admitted to [facility] (01/05/16-06-03/17) for abdominal pain, leukocytosis, hyponatremia, elevated Scr/BUN ( 7.43/90). RIJ TDC was placed and he was started on hemodialysis 01/15/16. He was DC'd with dx of gastroenteritis and pyelonephritis on cephalexin. While at [facility], he was declared ESRD and clipped to [facility]. He had first OP treatment at kidney center 01/17/16. Monday he came to hemodialysis, he arrived below EDW, became hypotensive and weak and was sent to ED for evaluation. Upon arrival K+ was 6.1. He received calcium gluconate, D50w, and insulin with decrease in K+ to 5.4. He was started on IVF for hydration and ceftriaxone for pyruia, urine cx pending. CXR unremarkable.    Currently he is awake, alert, "I feel so much better, I'm just can't express my gratitude". No C/Os. Does not remember events after arriving to HD center Monday or transport to [facility]. Says that he has been continuing antibiotics at home per DC orders. Currently denies pain, says weakness is improved, no fever, chills, N,V,D. No abdominal pain, no flank pain. Able to walk around in room without assistance.  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 He is divorced with one daughter/2 grandchildren who live in [place]. He lives with his dog, Ok Edwards. He is a former smoker quit 10 years ago. Denies ETOH abuse, illicit substance abuse.   Past Medical History  Diagnosis Date  . 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 CKD (chronic kidney disease)   . History of benign  colon tumor   . DM2 (diabetes mellitus, type 2) ([place])   . ESRD (end stage renal disease) [facility])    Past Surgical History  Procedure Laterality Date  . Colectomy    . Thoracic aortic endovascular stent graft N/A 07/12/2014    Procedure: THORACIC AORTIC ENDOVASCULAR STENT GRAFT;  Surgeon: Serafina Mitchell, MD;  Location: [place];  Service: Vascular;  Laterality: N/A;  . Hip arthroplasty Right 07/14/2014    Procedure: ARTHROPLASTY BIPOLAR HIP;  Surgeon: Rozanna Box, MD;  Location: [place];  Service: Orthopedics;  Laterality: Right;   History reviewed. No pertinent family history. Social History:  reports that he has quit smoking. His smoking use included Cigarettes. He does not have any smokeless tobacco history on file. He reports that he does not drink alcohol or use illicit drugs. Allergies  Allergen Reactions  . Bactrim [Sulfamethoxazole-Trimethoprim]     COULDN'T MOVE. REAL WEAK  . Metformin And Related     Real weak   Prior to Admission medications   Medication Sig Start Date End Date Taking? Authorizing Provider  diazepam (VALIUM) 5 MG tablet Take 1 tablet (5 mg total) by mouth every 8 (eight) hours as needed. 07/22/14  Yes Lisette Abu, PA-C  dorzolamide-timolol (COSOPT) 22.3-6.8 MG/ML ophthalmic solution Place 1 drop into the left eye 2 (two) times daily. 04/19/14  Yes Historical Provider, MD  insulin aspart (NOVOLOG) 100 UNIT/ML injection Inject 0-15 Units into the skin 3 (three) times daily before meals. Per sliding scale   Yes Historical Provider, MD  insulin detemir (LEVEMIR) 100 UNIT/ML injection Inject 46 Units into the skin daily.  Yes Historical Provider, MD  Multiple Vitamin (MULTIVITAMIN WITH MINERALS) TABS tablet Take 1 tablet by mouth daily.   Yes Historical Provider, MD  enoxaparin (LOVENOX) 30 MG/0.3ML injection Inject 0.3 mLs (30 mg total) into the skin every 12 (twelve) hours. 07/22/14 08/08/14  Ainsley Spinner, PA-C  traMADol (ULTRAM) 50 MG tablet Take 2 tablets (100  mg total) by mouth every 6 (six) hours. Patient not taking: Reported on 01/22/2016 07/22/14   Lisette Abu, PA-C   Current Facility-Administered Medications  Medication Dose Route Frequency Provider Last Rate Last Dose  . cefTRIAXone (ROCEPHIN) 1 g in dextrose 5 % 50 mL IVPB  1 g Intravenous Q24H Orson Eva, MD   1 g at 01/23/16 1017  . dorzolamide-timolol (COSOPT) 22.3-6.8 MG/ML ophthalmic solution 1 drop  1 drop Left Eye BID Etta Quill, DO      . [START ON 01/24/2016] ferric gluconate (NULECIT) 125 mg in sodium chloride 0.9 % 100 mL IVPB  125 mg Intravenous Q M,W,F-HD Valentina Gu, NP      . insulin aspart (novoLOG) injection 0-15 Units  0-15 Units Subcutaneous TID WC Etta Quill, DO   3 Units at 01/23/16 0930  . insulin glargine (LANTUS) injection 25 Units  25 Units Subcutaneous QHS Orson Eva, MD       Labs: Basic Metabolic Panel:  Recent Labs Lab 01/22/16 1724 01/22/16 2050 01/23/16 0143 01/23/16 0227  NA 132* 135  --  134*  K 6.9* 5.4* 4.9 5.2*  CL 99* 99*  --  102  CO2 24 27  --  23  GLUCOSE 96 249*  --  222*  BUN 38* 39*  --  43*  CREATININE 5.81* 6.06*  --  6.33*  CALCIUM 8.8* 8.6*  --  8.3*   Liver Function Tests:  Recent Labs Lab 01/22/16 1554  AST 30  ALT 13*  ALKPHOS 92  BILITOT 1.3*  PROT 7.4  ALBUMIN 3.5   CBC:  Recent Labs Lab 01/22/16 1554 01/22/16 1602 01/23/16 0942  WBC 12.5*  --  9.4  NEUTROABS 8.6*  --   --   HGB 9.9* 11.2* 9.3*  HCT 32.2* 33.0* 30.8*  MCV 86.1  --  86.5  PLT 48*  --  63*   CBG:  Recent Labs Lab 01/22/16 1447 01/22/16 2011 01/22/16 2254 01/23/16 0423 01/23/16 0737  GLUCAP 93 246* 219* 263* 191*   Studies/Results: Dg Chest 2 View  01/22/2016  CLINICAL DATA:  Acute onset weakness and lightheadedness at 1:15 p.m. today while undergoing dialysis. EXAM: CHEST  2 VIEW COMPARISON:  Single-view of the chest 09/01/2014 and CT chest 07/12/2014. FINDINGS: Dialysis catheter is noted. Aortic stent graft is  in place. The lungs are clear. Heart size is normal. No pneumothorax or pleural effusion. IMPRESSION: No acute disease. Electronically Signed   By: Inge Rise M.D.   On: 01/22/2016 15:11    ROS: As per HPI otherwise negative.   Physical Exam: Filed Vitals:   01/23/16 0200 01/23/16 0230 01/23/16 0330 01/23/16 0900  BP: 92/57 95/52 105/48 112/55  Pulse: 62 63 80 80  Temp:    98 F (36.7 C)  TempSrc:    Oral  Resp: 17 12 16 18   SpO2: 96% 98% 97% 97%     General: Pleasant, slightly disheveled, well nourished, in no acute distress. Head: Normocephalic, atraumatic, sclera non-icteric, mucus membranes are moist Neck: Supple. JVD not elevated. Lungs: Clear bilaterally to auscultation without wheezes, rales, or rhonchi. Breathing is unlabored.  Heart: RRR with S1 S2. No murmurs, rubs, or gallops appreciated. Abdomen: Soft, non-tender, non-distended with normoactive bowel sounds. No rebound/guarding. No obvious abdominal masses. Ileostomy RLQ brown/beige stool.  M-S:  Strength and tone appear normal for age. Lower extremities:without edema or ischemic changes, no open wounds  Neuro: Alert and oriented X 3. Moves all extremities spontaneously. CN II-XII grossly intact.  Psych:  Responds to questions appropriately with a normal affect. Dialysis Access: RIJ TDC drsg intact.   Dialysis Orders:  [place] MWF 4 hours EDW 75 kg 400/800 2.0K/2.25 Ca  Heparin 5000 units IV per treatment Venofer 100 mg IV X 10 (Tsat 18 01/17/16)   Assessment/Plan: 1.  Hyperkalemia: K+ down to 5.2. Short HD today off schedule. Start HD on 1.0K bath and recheck K+ after one hours. Patient admits dietary indiscretions.  2.  Pyuria: WBC initially 12.5 now 9.4. Tmax 98.0 On rocephin, urine cx pending. MRSA negative 3.  ESRD -  MWF @ [place]. Short HD off schedule today, regular tx tomorrow.  4.  Hypertension/volume  - SBP 90s-110s. Needs OP EDW raised. Run even today and tomorrow.  5.  Anemia  - HGB 9.3 load  with Fe unless urine cultures results positive. Follow HGB start low dose ESA.  6.  Metabolic bone disease -  No binders/VDRA at present. Ca 9.2 C Ca 9.2 Phos 4.5 PTH 210 (01/19/16)  7.  Nutrition - Albumin 3.5. Renal/Carb mod diet.  8.  DM: per primary.  9.    Permanent HD access: Patient has F/U at [facility] for [facility], however is amenable to seeing [facility] since they are closer to [facility]. Will call consult for permanent access placement.    Rita H. Owens Shark, NP-C 01/23/2016, 12:50 PM  [facility] Beeper [phone]  Pt seen, examined and agree w A/P as above. ESRD pt recent start at [facility] and sent to [place], here with severe fatigue and "not feeling right'.  Admitted w high K and given IVF and kayexalate and today feels a lot better.  Suspect he was low on volume (low BP's) and/ or having effects of high K.  Will ask [facility] to see for perm access while he is here.  Kelly Splinter MD [employer] pager [phone]    cell [phone] 01/23/2016, 3:11 PM

## 2016-01-23 NOTE — Progress Notes (Signed)
Inpatient Diabetes Program Recommendations  AACE/ADA: New Consensus Statement on Inpatient Glycemic Control (2015)  Target Ranges:  Prepandial:   less than 140 mg/dL      Peak postprandial:   less than 180 mg/dL (1-2 hours)      Critically ill patients:  140 - 180 mg/dL   Results for BRIGHTEN, LOVITT (MRN [id number]) as of 01/23/2016 10:21  Ref. Range 01/22/2016 14:47 01/22/2016 20:11 01/22/2016 22:54 01/23/2016 07:37  Glucose-Capillary Latest Ref Range: 65-99 mg/dL 93 246 (H) 219 (H) 191 (H)   Review of Glycemic Control  Diabetes history: DM 2 Outpatient Diabetes medications: Levemir 46 units Daily, Novolog 0-15 units TID Current orders for Inpatient glycemic control: Lantus 25 units, Novolog Moderate TID  Inpatient Diabetes Program Recommendations: Insulin - Basal: Patient received Lantus 30 units last night, Glucose was 190's this am. Patient currently ordered Lantus 25 units. Please increase Lantus to 32 units.  Thanks,  Tama Headings RN, MSN, [facility] Inpatient Diabetes Coordinator Team Pager [phone] (8a-5p)

## 2016-01-23 NOTE — Care Management Important Message (Signed)
Important Message  Patient Details  Name: Travis Carlson MRN: [id number] Date of Birth: 08/16/1945   Medicare Important Message Given:  Yes    Loann Quill 01/23/2016, 8:54 AM

## 2016-01-23 NOTE — ED Notes (Signed)
Dr. Jared at bedside. 

## 2016-01-23 NOTE — Progress Notes (Addendum)
PROGRESS NOTE  Travis Carlson [id number] DOB: 1946/06/10 DOA: 01/22/2016 PCP: Parke Poisson, MD  Brief History:  70 year old male with a history of Barrett's esophagus, ESRD, diabetes mellitus type 2, prostate cancer s/p TURP, ileostomy presented with severe generalized weakness after HD. According to the patient, no fluid was taken off during dialysis as he was under his dry weight.  The patient was recently discharged from hospitalization at [facility] from 01/05/2016 through 01/13/2016. During that hospitalization, the patient presented with abdominal pain, severe hyperkalemia and was declared new ESRD. The patient was initiated on hemodialysis initially and a right-sided IJ PermCath was placed.  The patient had his first outpatient dialysis on 01/15/2016. The patient was diagnosed with viral gastroenteritis and pyelonephritis. Urinalysis on 01/06/2016 showed  TNTC WBC, but urine culture was not obtained when reviewed on CareEverywhere.  The patient was discharged home with cephalexin for 15 more doses. The patient states that he has proximally 7 doses left on the day of this admission. In the emergency department, the patient was noted to have serum potassium up to 6.9. He was treated with insulin, D50, bicarbonate, and calcium chloride. Repeat potassium was 5.2.  Assessment/Plan: Hyperkalemia in the setting of ESRD  -improved with temporizing measures -nephrology consulted for HD needs -remain on tele for now -pt endorses some dietary indiscretion  Generalized weakness -Due to a combination of hypovolemia and possible urinary tract infection -feeling betting since given IVF -saline lock IVF for now -monitor weights  Pyuria -Urinalysis shows TNTC WBC -The patient still has residual renal function -Patient states that he still makes approximately 8-16 oz of urine daily -start ceftriaxone pending urine culture  Thrombocytopenia -?lab error -platelets 200-300K range at  [facility] -check B12 -CBC in non-EDTA tube -HIT antibodies -check coags, fibrinogen  ESRD -Nephrology has been consulted for dialysis needs  Diabetes mellitus type 2 -Continue reduced dose Levemir, 25 units at bedtime -NovoLog sliding scale -Hemoglobin A1c  History of familial polyposis syndrome -Status post colectomy with ostomy -Status post Whipple's procedure 1996   Disposition Plan:   Home 6/14 or 6/15 Family Communication:   No Family at beside  Consultants:  Nephrology  Code Status:  FULL   Subjective: Patient is feeling better this morning after some intravenous fluids. Denies any fevers, chest, chest discomfort, shortness breath, nausea, vomiting, abdominal pain. He complains of dysuria.  Objective: Filed Vitals:   01/23/16 0130 01/23/16 0200 01/23/16 0230 01/23/16 0330  BP: 114/54 92/57 95/52  105/48  Pulse: 63 62 63 80  Temp:      TempSrc:      Resp: 11 17 12 16   SpO2: 98% 96% 98% 97%    Intake/Output Summary (Last 24 hours) at 01/23/16 0732 Last data filed at 01/23/16 [id number]  Gross per 24 hour  Intake   1240 ml  Output    350 ml  Net    890 ml   Weight change:  Exam:   General:  Pt is alert, follows commands appropriately, not in acute distress  HEENT: No icterus, No thrush, No neck mass, [place]/AT  Cardiovascular: RRR, S1/S2, no rubs, no gallops  Respiratory: CTA bilaterally, no wheezing, no crackles, no rhonchi  Abdomen: Soft/+BS, non tender, non distended, no guarding  Extremities: No edema, No lymphangitis, No petechiae, No rashes, no synovitis   Data Reviewed: I have personally reviewed following labs and imaging studies Basic Metabolic Panel:  Recent Labs Lab 01/22/16 1554 01/22/16 1602 01/22/16 1724 01/22/16  2050 01/23/16 0143 01/23/16 0227  NA 135 135 132* 135  --  134*  K 5.9* 5.2* 6.9* 5.4* 4.9 5.2*  CL 102 101 99* 99*  --  102  CO2 20*  --  24 27  --  23  GLUCOSE 98 100* 96 249*  --  222*  BUN 39* 38* 38* 39*  --  43*   CREATININE 5.59* 5.60* 5.81* 6.06*  --  6.33*  CALCIUM 9.0  --  8.8* 8.6*  --  8.3*  MG  --   --   --  1.8  --   --    Liver Function Tests:  Recent Labs Lab 01/22/16 1554  AST 30  ALT 13*  ALKPHOS 92  BILITOT 1.3*  PROT 7.4  ALBUMIN 3.5   No results for input(s): LIPASE, AMYLASE in the last 168 hours. No results for input(s): AMMONIA in the last 168 hours. Coagulation Profile: No results for input(s): INR, PROTIME in the last 168 hours. CBC:  Recent Labs Lab 01/22/16 1554 01/22/16 1602  WBC 12.5*  --   NEUTROABS 8.6*  --   HGB 9.9* 11.2*  HCT 32.2* 33.0*  MCV 86.1  --   PLT 48*  --    Cardiac Enzymes: No results for input(s): CKTOTAL, CKMB, CKMBINDEX, TROPONINI in the last 168 hours. BNP: Invalid input(s): POCBNP CBG:  Recent Labs Lab 01/22/16 1447 01/22/16 2011 01/22/16 2254  GLUCAP 93 246* 219*   HbA1C: No results for input(s): HGBA1C in the last 72 hours. Urine analysis:    Component Value Date/Time   COLORURINE YELLOW 01/22/2016 1636   APPEARANCEUR TURBID* 01/22/2016 1636   LABSPEC 1.015 01/22/2016 1636   PHURINE 7.5 01/22/2016 1636   GLUCOSEU NEGATIVE 01/22/2016 1636   HGBUR SMALL* 01/22/2016 1636   BILIRUBINUR NEGATIVE 01/22/2016 1636   KETONESUR NEGATIVE 01/22/2016 1636   PROTEINUR 100* 01/22/2016 1636   UROBILINOGEN 0.2 07/12/2014 0726   NITRITE NEGATIVE 01/22/2016 1636   LEUKOCYTESUR LARGE* 01/22/2016 1636   Sepsis Labs: @LABRCNTIP (procalcitonin:4,lacticidven:4) )No results found for this or any previous visit (from the past 240 hour(s)).   Scheduled Meds: . dorzolamide-timolol  1 drop Left Eye BID  . heparin  5,000 Units Subcutaneous Q8H  . insulin aspart  0-15 Units Subcutaneous TID WC  . insulin aspart  10 Units Subcutaneous TID WC  . insulin glargine  30 Units Subcutaneous QHS   Continuous Infusions: . sodium chloride 1,000 mL (01/22/16 2148)    Procedures/Studies: Dg Chest 2 View  01/22/2016  CLINICAL DATA:  Acute onset  weakness and lightheadedness at 1:15 p.m. today while undergoing dialysis. EXAM: CHEST  2 VIEW COMPARISON:  Single-view of the chest 09/01/2014 and CT chest 07/12/2014. FINDINGS: Dialysis catheter is noted. Aortic stent graft is in place. The lungs are clear. Heart size is normal. No pneumothorax or pleural effusion. IMPRESSION: No acute disease. Electronically Signed   By: Inge Rise M.D.   On: 01/22/2016 15:11    Marzell Allemand, DO  [facility] Pager [phone]  If 7PM-7AM, please contact night-coverage [email or web address] Password [email or web address] 01/23/2016, 7:32 AM   LOS: 1 day

## 2016-01-23 NOTE — ED Notes (Signed)
RN attempted to call report to floor; RN to call back  

## 2016-01-24 ENCOUNTER — Inpatient Hospital Stay (HOSPITAL_COMMUNITY): Payer: Medicare Other

## 2016-01-24 DIAGNOSIS — N186 End stage renal disease: Secondary | ICD-10-CM

## 2016-01-24 DIAGNOSIS — I959 Hypotension, unspecified: Secondary | ICD-10-CM

## 2016-01-24 LAB — RENAL FUNCTION PANEL
Albumin: 3.4 g/dL — ABNORMAL LOW (ref 3.5–5.0)
Anion gap: 10 (ref 5–15)
BUN: 31 mg/dL — ABNORMAL HIGH (ref 6–20)
CO2: 22 mmol/L (ref 22–32)
Calcium: 9.2 mg/dL (ref 8.9–10.3)
Chloride: 104 mmol/L (ref 101–111)
Creatinine, Ser: 5.73 mg/dL — ABNORMAL HIGH (ref 0.61–1.24)
GFR calc Af Amer: 10 mL/min — ABNORMAL LOW (ref >60)
GFR calc non Af Amer: 9 mL/min — ABNORMAL LOW (ref >60)
Glucose, Bld: 97 mg/dL (ref 65–99)
Phosphorus: 5.8 mg/dL — ABNORMAL HIGH (ref 2.5–4.6)
Potassium: 5.3 mmol/L — ABNORMAL HIGH (ref 3.5–5.1)
Sodium: 136 mmol/L (ref 135–145)

## 2016-01-24 LAB — CBC
HCT: 32.4 % — ABNORMAL LOW (ref 39.0–52.0)
Hemoglobin: 9.8 g/dL — ABNORMAL LOW (ref 13.0–17.0)
MCH: 26.3 pg (ref 26.0–34.0)
MCHC: 30.2 g/dL (ref 30.0–36.0)
MCV: 87.1 fL (ref 78.0–100.0)
Platelets: 67 10*3/uL — ABNORMAL LOW (ref 150–400)
RBC: 3.72 MIL/uL — ABNORMAL LOW (ref 4.22–5.81)
RDW: 15.3 % (ref 11.5–15.5)
WBC: 7.7 10*3/uL (ref 4.0–10.5)

## 2016-01-24 LAB — HEPARIN INDUCED PLATELET AB (HIT ANTIBODY): HEPARIN INDUCED PLT AB: 2.823 {OD_unit} — AB (ref 0.000–0.400)

## 2016-01-24 LAB — GLUCOSE, CAPILLARY
GLUCOSE-CAPILLARY: 124 mg/dL — AB (ref 65–99)
GLUCOSE-CAPILLARY: 214 mg/dL — AB (ref 65–99)
Glucose-Capillary: 234 mg/dL — ABNORMAL HIGH (ref 65–99)
Glucose-Capillary: 229 mg/dL — ABNORMAL HIGH (ref 65–99)

## 2016-01-24 LAB — APTT: APTT: 29 s (ref 24–37)

## 2016-01-24 MED ORDER — PENTAFLUOROPROP-TETRAFLUOROETH EX AERO: 1.0000 "application " | INHALATION_SPRAY | CUTANEOUS | Status: DC | PRN

## 2016-01-24 MED ORDER — ARGATROBAN 50 MG/50ML IV SOLN
1.0000 ug/kg/min | INTRAVENOUS | Status: DC
  Administered 2016-01-25: 1 ug/kg/min via INTRAVENOUS
  Filled 2016-01-24: qty 50

## 2016-01-24 MED ORDER — LIDOCAINE-PRILOCAINE 2.5-2.5 % EX CREA: 1.0000 "application " | TOPICAL_CREAM | CUTANEOUS | Status: DC | PRN

## 2016-01-24 MED ORDER — HEPARIN SODIUM (PORCINE) 1000 UNIT/ML DIALYSIS: 1000.0000 [IU] | INTRAMUSCULAR | Status: DC | PRN

## 2016-01-24 MED ORDER — HEPARIN SODIUM (PORCINE) 1000 UNIT/ML DIALYSIS
5000.0000 [IU] | Freq: Once | INTRAMUSCULAR | Status: DC
Start: 2016-01-24 — End: 2016-01-24
  Filled 2016-01-24: qty 5

## 2016-01-24 MED ORDER — LIDOCAINE HCL (PF) 1 % IJ SOLN: 5.0000 mL | INTRAMUSCULAR | Status: DC | PRN

## 2016-01-24 MED ORDER — SODIUM CHLORIDE 0.9 % IV SOLN: 100.0000 mL | INTRAVENOUS | Status: DC | PRN

## 2016-01-24 MED ORDER — ALTEPLASE 2 MG IJ SOLR: 2.0000 mg | Freq: Once | INTRAMUSCULAR | Status: DC | PRN

## 2016-01-24 MED ORDER — SODIUM CHLORIDE 0.9 % IV BOLUS (SEPSIS)
1000.0000 mL | Freq: Once | INTRAVENOUS | Status: AC
  Administered 2016-01-24: 1000 mL via INTRAVENOUS

## 2016-01-24 MED ORDER — ONDANSETRON HCL 4 MG/2ML IJ SOLN
4.0000 mg | Freq: Once | INTRAMUSCULAR | Status: AC
  Administered 2016-01-24: 4 mg via INTRAVENOUS

## 2016-01-24 MED ORDER — ONDANSETRON HCL 4 MG/2ML IJ SOLN
INTRAMUSCULAR | Status: AC
  Filled 2016-01-24: qty 2

## 2016-01-24 MED ORDER — DARBEPOETIN ALFA 25 MCG/0.42ML IJ SOSY
PREFILLED_SYRINGE | INTRAMUSCULAR | Status: AC
  Filled 2016-01-24: qty 0.42

## 2016-01-24 MED ORDER — HEPARIN SODIUM (PORCINE) 1000 UNIT/ML DIALYSIS: 2500.0000 [IU] | Freq: Once | INTRAMUSCULAR | Status: DC

## 2016-01-24 NOTE — Progress Notes (Addendum)
Travis Carlson  [facility] Progress Note   Subjective:  "I'm upset because I didn't go to HD this morning because I was afraid I'd miss my breakfast...". No C/O pain/discomfort. Walking laps in hallway. New start to HD, getting adjusted to dialysis, emotional support to pt.   Objective Filed Vitals:   01/24/16 1225 01/24/16 1235 01/24/16 1240 01/24/16 1300  BP: 103/73 104/72 103/72 79/43  Pulse: 63 61 67 80  Temp: 97.8 F (36.6 C)     TempSrc: Oral     Resp: 15 14 13 15   Height:      Weight: 70.9 kg (156 lb 4.9 oz)     SpO2: 98%      Physical Exam General: pleasant, NAD Heart: S1, S2, RRR Lungs: CTA A/P Abdomen:Soft nontender. RLQ ileostomy Extremities:No LE edema Dialysis Access: RIJ TDC drsg CDI  Dialysis: [place] MWF 4 hours EDW 75 kg 400/800 2.0K/2.25 Ca  Heparin 5000 units IV per treatment Venofer 100 mg IV X 10 (Tsat 18 01/17/16)  Assessment/Plan: 1. Severe weakness - prob due to severe hyperkalemia. in setting hyperkalemia, also poss uremia.  Resolved with HD 01/23/16. K+ 4.4. Will need dietary counseling  2. Pyuria: WBC initially 12.5 now 9.4. Tmax 98.6 On rocephin, urine cx NG 2 days. MRSA neg 3. ESRD - MWF @ [place]. HD today Feels bad again at HD today, nauseous and HA and just feels "bad", no UF w HD today but BP dropped into 80's once.  Came off early.  Could be allergic to membrane.  Will trying rinsing dialyzer w saline next Rx 2L.  Will dialyze again tomorrow too see if her tolerates it better.  Check orthostatics too.   4. HTN/volume - SBP 90s-110s. Not on BP meds here or at home.  Is 4 kg below dry wt.  Need to lower dry wt accordingly 5. Anemia - HGB 9.3 load with Fe unless urine cultures results positive, start low dose ESA 6. Metabolic bone disease - No binders/VDRA at present. Ca 9.2 C Ca 9.2 Phos 4.5 PTH 210 7. Nutrition - Albumin 3.5. Renal/Carb mod diet.  8. DM: per primary.   9.     Permanent HD access: [facility] consulted. Awaiting vein  mapping 10.         Vol depletion/  Hypotension - resolved. Below dry wt by 3-4 kg, will need adjusting at dc.   Rita H. Owens Shark, NP-C 01/23/2016, 12:50 PM  [facility] Beeper [phone]  Pt seen, examined, agree w assess/plan as above with additions as indicated.  Kelly Splinter MD [facility] pager [phone]    cell [phone] 01/24/2016, 1:35 PM     Additional Objective Labs: Basic Metabolic Panel:  Recent Labs Lab 01/22/16 2050  01/23/16 0227 01/23/16 1500 01/24/16 1230  NA 135  --  134*  --  136  K 5.4*  < > 5.2* 4.4 5.3*  CL 99*  --  102  --  104  CO2 27  --  23  --  22  GLUCOSE 249*  --  222*  --  97  BUN 39*  --  43*  --  31*  CREATININE 6.06*  --  6.33*  --  5.73*  CALCIUM 8.6*  --  8.3*  --  9.2  PHOS  --   --   --   --  5.8*  < > = values in this interval not displayed. Liver Function Tests:  Recent Labs Lab 01/22/16 1554 01/24/16 1230  AST  30  --   ALT 13*  --   ALKPHOS 92  --   BILITOT 1.3*  --   PROT 7.4  --   ALBUMIN 3.5 3.4*   CBC:  Recent Labs Lab 01/22/16 1554 01/22/16 1602 01/23/16 0942 01/24/16 1230  WBC 12.5*  --  9.4 7.7  NEUTROABS 8.6*  --   --   --   HGB 9.9* 11.2* 9.3* 9.8*  HCT 32.2* 33.0* 30.8* 32.4*  MCV 86.1  --  86.5 87.1  PLT 48*  --  63* 67*   Blood Culture    Component Value Date/Time   SDES URINE, CATHETERIZED 01/22/2016 1636   SPECREQUEST NONE 01/22/2016 1636   CULT NO GROWTH 01/22/2016 1636   REPTSTATUS 01/23/2016 FINAL 01/22/2016 1636    CBG:  Recent Labs Lab 01/23/16 1251 01/23/16 1748 01/23/16 2147 01/24/16 0735 01/24/16 1139  GLUCAP 199* 98 255* 234* 124*   Studies/Results: Dg Chest 2 View  01/22/2016  CLINICAL DATA:  Acute onset weakness and lightheadedness at 1:15 p.m. today while undergoing dialysis. EXAM: CHEST  2 VIEW COMPARISON:  Single-view of the chest 09/01/2014 and CT chest 07/12/2014. FINDINGS: Dialysis catheter is noted. Aortic stent graft is in place.  The lungs are clear. Heart size is normal. No pneumothorax or pleural effusion. IMPRESSION: No acute disease. Electronically Signed   By: Inge Rise M.D.   On: 01/22/2016 15:11   Medications:   . cefTRIAXone (ROCEPHIN)  IV  1 g Intravenous Q24H  . darbepoetin (ARANESP) injection - DIALYSIS  25 mcg Intravenous Q Wed-HD  . dorzolamide-timolol  1 drop Left Eye BID  . ferric gluconate (FERRLECIT/NULECIT) IV  125 mg Intravenous Q M,W,F-HD  . heparin  2,500 Units Dialysis Once in dialysis  . heparin  5,000 Units Dialysis Once in dialysis  . insulin aspart  0-15 Units Subcutaneous TID WC  . insulin glargine  25 Units Subcutaneous QHS

## 2016-01-24 NOTE — Progress Notes (Deleted)
Marland Kitchen  [facility] Progress Note   Subjective:  "I'm upset because I didn't go to HD this morning because I was afraid I'd miss my breakfast...". No C/O pain/discomfort. Walking laps in hallway. New start to HD, getting adjusted to dialysis, emotional support to pt.   Objective Filed Vitals:   01/23/16 1800 01/23/16 2022 01/24/16 0523 01/24/16 1000  BP:  106/54 105/56 112/66  Pulse:  69 59 62  Temp:  98.2 F (36.8 C) 99 F (37.2 C) 98.6 F (37 C)  TempSrc:    Oral  Resp:  17 20 18   Height: 5\' 8"  (1.727 m)     Weight:      SpO2:  97% 90% 96%   Physical Exam General: pleasant, NAD Heart: S1, S2, RRR Lungs: CTA A/P Abdomen:Soft nontender. RLQ ileostomy Extremities:No LE edema Dialysis Access: RIJ TDC drsg CDI  Dialysis: [facility] MWF 4 hours EDW 75 kg 400/800 2.0K/2.25 Ca  Heparin 5000 units IV per treatment Venofer 100 mg IV X 10 (Tsat 18 01/17/16)  Assessment/Plan: 1. Severe weakness - prob due to severe hyperkalemia. in setting hyperkalemia, also poss uremia.  Resolved with HD 01/23/16. K+ 4.4. Will need dietary counseling 2. Pyuria: WBC initially 12.5 now 9.4. Tmax 98.6 On rocephin, urine cx NG 2 days. MRSA neg 3. ESRD - MWF @ . HD today 4. Hypertension/volume - SBP 90s-110s. Not on BP meds here or at home.  Is 4 kg below dry wt.  Need to lower dry wt accordingly 5. Anemia - HGB 9.3 load with Fe unless urine cultures results positive, start low dose ESA 6. Metabolic bone disease - No binders/VDRA at present. Ca 9.2 C Ca 9.2 Phos 4.5 PTH 210 7. Nutrition - Albumin 3.5. Renal/Carb mod diet.  8. DM: per primary.   9.     Permanent HD access: VVS consulted. Awaiting vein mapping 10.         Vol depletion/  Hypotension - resolved. Below dry wt by 3-4 kg, will need adjusting at dc.   Rita H. Owens Shark, NP-C 01/23/2016, 12:50 PM  [facility] Beeper [phone]  Pt seen, examined, agree w assess/plan as above with additions as  indicated.  Kelly Splinter MD [facility] pager [phone]    cell [phone] 01/24/2016, 1:15 PM     Additional Objective Labs: Basic Metabolic Panel:  Recent Labs Lab 01/22/16 1724 01/22/16 2050 01/23/16 0143 01/23/16 0227 01/23/16 1500  NA 132* 135  --  134*  --   K 6.9* 5.4* 4.9 5.2* 4.4  CL 99* 99*  --  102  --   CO2 24 27  --  23  --   GLUCOSE 96 249*  --  222*  --   BUN 38* 39*  --  43*  --   CREATININE 5.81* 6.06*  --  6.33*  --   CALCIUM 8.8* 8.6*  --  8.3*  --    Liver Function Tests:  Recent Labs Lab 01/22/16 1554  AST 30  ALT 13*  ALKPHOS 92  BILITOT 1.3*  PROT 7.4  ALBUMIN 3.5   CBC:  Recent Labs Lab 01/22/16 1554 01/22/16 1602 01/23/16 0942  WBC 12.5*  --  9.4  NEUTROABS 8.6*  --   --   HGB 9.9* 11.2* 9.3*  HCT 32.2* 33.0* 30.8*  MCV 86.1  --  86.5  PLT 48*  --  63*   Blood Culture    Component Value Date/Time   SDES URINE, CATHETERIZED 01/22/2016  [place] 01/22/2016 1636   CULT NO GROWTH 01/22/2016 1636   REPTSTATUS 01/23/2016 FINAL 01/22/2016 1636    CBG:  Recent Labs Lab 01/23/16 1251 01/23/16 1748 01/23/16 2147 01/24/16 0735 01/24/16 1139  GLUCAP 199* 98 255* 234* 124*   Studies/Results: Dg Chest 2 View  01/22/2016  CLINICAL DATA:  Acute onset weakness and lightheadedness at 1:15 p.m. today while undergoing dialysis. EXAM: CHEST  2 VIEW COMPARISON:  Single-view of the chest 09/01/2014 and CT chest 07/12/2014. FINDINGS: Dialysis catheter is noted. Aortic stent graft is in place. The lungs are clear. Heart size is normal. No pneumothorax or pleural effusion. IMPRESSION: No acute disease. Electronically Signed   By: Inge Rise M.D.   On: 01/22/2016 15:11   Medications:   . cefTRIAXone (ROCEPHIN)  IV  1 g Intravenous Q24H  . darbepoetin (ARANESP) injection - DIALYSIS  25 mcg Intravenous Q Wed-HD  . dorzolamide-timolol  1 drop Left Eye BID  . ferric gluconate (FERRLECIT/NULECIT) IV  125 mg  Intravenous Q M,W,F-HD  . heparin  2,500 Units Dialysis Once in dialysis  . heparin  5,000 Units Dialysis Once in dialysis  . insulin aspart  0-15 Units Subcutaneous TID WC  . insulin glargine  25 Units Subcutaneous QHS

## 2016-01-24 NOTE — Progress Notes (Signed)
VASCULAR LAB PRELIMINARY  PRELIMINARY  PRELIMINARY  PRELIMINARY  Right  Upper Extremity Vein Map    Cephalic  Segment Diameter Depth  1. Axilla 1.26mm 98mm  2. Mid upper arm 1.45mm 54mm  3. Above [facility] 1.38mm 3.37mm  4. In AC 65mm 3.25mm  5. Below AC 1.60mm 2.35mm  6. Mid forearm 1.67mm 2.64mm  7. Wrist mm mm    All areas were patent and compressible except the area at the [facility] and below which was thrombosed at the IV site. Basilic  Segment Diameter Depth  1. Axilla 2.77mm 7.48mm  2. Mid upper arm 2.60mm 5.33mm  3. Above AC 3.26mm 3.60mm  4. In AC 2.67mm 63mm  5. Below AC 2.61mm 69mm  6. Mid forearm 1.46mm 63mm  7. Wrist Unable to evaluate due to size                                                                                                            Left Upper Extremity Vein Map    Cephalic  Segment Diameter Depth  1. Axilla 1.20mm 31.16mm  2. Mid upper arm 34mm 17.85mm  3. Above AC 1.68mm 47mm  4. In [facility] 3.46mm 9.46mm  5. Below AC 2.20mm 12.35mm  6. Mid forearm 1.24mm 10.71mm  7. Wrist 2.59mm 10.64mm  All areas patent and compressible Basilic  Segment Diameter Depth  1. Axilla 3.66mm 13.50mm  2. Mid upper arm 3.10mm 7.59mm  3. Above AC 2.2mm 3.67mm  4. In AC 1.37mm 3.51mm  5. Below AC 1.57mm 2.43mm   All areas patent and compressible Unable to evaluate the forearm due to size  Janifer Adie, RVT,RCMS 01/24/2016, 6:09 PM

## 2016-01-24 NOTE — Progress Notes (Signed)
Still awaiting vein map Will check again tomorrow  Ruta Hinds, MD [facility] Office: [phone] Pager: [phone]

## 2016-01-24 NOTE — Clinical Documentation Improvement (Signed)
Internal Medicine Nephrology/Renal  Can the diagnosis of anemia be further specified? Please document response in next progress note. Thank you!   Iron deficiency Anemia  Nutritional anemia, including the nutrition or mineral deficits  Chronic Blood Loss Anemia, including the suspected or known cause  Anemia of chronic disease, including the associated chronic disease state  Other  Clinically Undetermined  Document any associated diagnoses/conditions.  Supporting Information:  Anemia - HGB 9.3 load with Fe unless urine cultures results positive. Follow HGB start low dose ESA - Renal Consult  Has ESRD  Please exercise your independent, professional judgment when responding. A specific answer is not anticipated or expected.  Thank You,  Zoila Shutter RN, BSN, [place] [phone]; Cell: [phone]

## 2016-01-24 NOTE — Progress Notes (Signed)
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 Bactrim [Sulfamethoxazole-Trimethoprim]     COULDN'T MOVE. REAL WEAK  . Metformin And Related     Real weak    Patient Measurements: Height: 5\' 8"  (172.7 cm) Weight: 158 lb 4.6 oz (71.8 kg) IBW/kg (Calculated) : 68.4  Vital Signs: Temp: 98.6 F (37 C) (06/14 1657) Temp Source: Oral (06/14 1657) BP: 92/43 mmHg (06/14 1657) Pulse Rate: 53 (06/14 1657)  Labs:  Recent Labs  01/22/16 1554 01/22/16 1602  01/22/16 2050 01/23/16 0227 01/23/16 0942 01/24/16 1230  HGB 9.9* 11.2*  --   --   --  9.3* 9.8*  HCT 32.2* 33.0*  --   --   --  30.8* 32.4*  PLT 48*  --   --   --   --  63* 67*  APTT  --   --   --   --   --  30  --   LABPROT  --   --   --   --   --  14.3  --   INR  --   --   --   --   --  1.09  --   CREATININE 5.59* 5.60*  < > 6.06* 6.33*  --  5.73*  < > = values in this interval not displayed.  Estimated Creatinine Clearance: 11.6 mL/min (by C-G formula based on Cr of 5.73).   Medical History: Past Medical History  Diagnosis Date  . 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 CKD (chronic kidney disease)   . History of benign colon tumor   . DM2 (diabetes mellitus, type 2) ([place])   . ESRD (end stage renal disease) ([place])     Medications:  Scheduled:  . cefTRIAXone (ROCEPHIN)  IV  1 g Intravenous Q24H  . Darbepoetin Alfa      . darbepoetin (ARANESP) injection - DIALYSIS  25 mcg Intravenous Q Wed-HD  . dorzolamide-timolol  1 drop Left Eye BID  . ferric gluconate (FERRLECIT/NULECIT) IV  125 mg Intravenous Q M,W,F-HD  . insulin aspart  0-15 Units Subcutaneous TID WC  . insulin glargine  25 Units Subcutaneous QHS  . ondansetron      . sodium chloride  1,000 mL Intravenous Once   Infusions:  . argatroban      Assessment: 70 yo male with thrombocytopenia. Heparin antibody is 2.8 (levels over 2.0 have a high  correlation with a positive SRA). Heparin sq given 6/12 and 6/13. 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   Spoke with K Schoor NP and to begin argatroban. I have also reported results to Nephrology and all forms of heparin have been discontinued.   Goal of Therapy:  aPTT 50-90 seconds Monitor platelets by anticoagulation protocol: Yes   Plan:  -Begin argatroban at 62mcg.kg/min -aPTT in 2 hours and daily -Daily CBC -SRA ordered 6/14 -A heparin allergy has been documented but will need to follow SRA results  Hildred Laser, Pharm D 01/24/2016 8:33 PM

## 2016-01-24 NOTE — Progress Notes (Signed)
PROGRESS NOTE                                                                                                                                                                                                             Patient Demographics:    Travis Carlson, is a 70 y.o. male, DOB - May 20, 1946, [id number]  Admit date - 01/22/2016   Admitting Physician Etta Quill, DO  Outpatient Primary MD for the patient is Parke Poisson, MD  LOS - 2  Outpatient Specialists: renal  Chief Complaint  Patient presents with  . Weakness       Brief Narrative  70 year old male with a history of Barrett's esophagus, ESRD, diabetes mellitus type 2, prostate cancer s/p TURP, ileostomy presented with severe generalized weakness after HD. According to the patient, no fluid was taken off during dialysis as he was under his dry weight. The patient was recently discharged from hospitalization at [facility] from 01/05/2016 through 01/13/2016. During that hospitalization, the patient presented with abdominal pain, severe hyperkalemia and was declared new ESRD. The patient was initiated on hemodialysis initially and a right-sided IJ PermCath was placed. The patient had his first outpatient dialysis on 01/15/2016. The patient was diagnosed with viral gastroenteritis and pyelonephritis. Urinalysis on 01/06/2016 showed TNTC WBC, but urine culture was not obtained when reviewed on CareEverywhere. The patient was discharged home with cephalexin for 15 more doses. The patient states that he has proximally 7 doses left on the day of this admission. In the emergency department, the patient was noted to have serum potassium up to 6.9. He was treated with insulin, D50, bicarbonate, and calcium chloride. Repeat potassium was 5.2.       Subjective:   Reports feeling better and being able to ambulate in the hallway without difficulty. he's unhappy since he  didn't have HD this morning    Assessment  & Plan :   Hyperkalemia in the setting of ESRD  -pt endorses some dietary indiscretion. Now stable with dialysis. Counseled on diet adherence  Generalized weakness -combination of hypovolemia and possible urinary tract infection Improved with IV fluids. Still had low blood pressure today.  Pyuria Has residual renal function and makes approximately 8-16 oz of urine daily -Eric ceftriaxone . Has had multiple UTIs in the last few months. cx negative.   Thrombocytopenia New finding.  Was normal until HD started. ?HIT. Check HIT ab. elevated fibrinigen, normal INR and APTT.  Asked renal to avoid heparin with HD.  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     code Status: Full code  Family Communication  :none at bedside  Disposition Plan  :  possible vein mapping tomorrow and discharge home if blood pressure stable.   Consults  :   Renal vascular  Procedures  :  HD   DVT Prophylaxis  :  scd  Lab Results  Component Value Date   PLT 67* 01/24/2016    Antibiotics  :    Anti-infectives    Start     Dose/Rate Route Frequency Ordered Stop   01/23/16 0900  cefTRIAXone (ROCEPHIN) 1 g in dextrose 5 % 50 mL IVPB     1 g 100 mL/hr over 30 Minutes Intravenous Every 24 hours 01/23/16 0822          Objective:   Filed Vitals:   01/24/16 1240 01/24/16 1300 01/24/16 1345 01/24/16 1657  BP: 103/72 79/43 103/48 92/43  Pulse: 67 80 60 53  Temp:   97.8 F (36.6 C) 98.6 F (37 C)  TempSrc:   Oral Oral  Resp: 13 15 16 18   Height:      Weight:   71.8 kg (158 lb 4.6 oz)   SpO2:   98% 98%    Wt Readings from Last 3 Encounters:  01/24/16 71.8 kg (158 lb 4.6 oz)    07/20/14 94.4 kg (208 lb 1.8 oz)     Intake/Output Summary (Last 24 hours) at 01/24/16 1715 Last data filed at 01/24/16 1700  Gross per 24 hour  Intake   1320 ml  Output   -950 ml  Net   2270 ml     Physical Exam  Gen: not in distress HEENT: no pallor, moist mucosa, supple neck Chest: clear b/l, no added sounds, HD catheter CVS: N S1&S2, no murmurs, rubs or gallop GI: soft, NT, ND, BS+ Musculoskeletal: warm, no edema CNS: alert and oriented,  non focal    Data Review:    CBC  Recent Labs Lab 01/22/16 1554 01/22/16 1602 01/23/16 0942 01/24/16 1230  WBC 12.5*  --  9.4 7.7  HGB 9.9* 11.2* 9.3* 9.8*  HCT 32.2* 33.0* 30.8* 32.4*  PLT 48*  --  63* 67*  MCV 86.1  --  86.5 87.1  MCH 26.5  --  26.1 26.3  MCHC 30.7  --  30.2 30.2  RDW 15.0  --  15.2 15.3  LYMPHSABS 2.8  --   --   --   MONOABS 0.4  --   --   --   EOSABS 0.6  --   --   --   BASOSABS 0.1  --   --   --     Chemistries   Recent Labs Lab 01/22/16 1554 01/22/16 1602 01/22/16 1724 01/22/16 2050 01/23/16 0143 01/23/16 0227 01/23/16 1500 01/24/16 1230  NA 135 135 132* 135  --  134*  --  136  K 5.9* 5.2* 6.9* 5.4* 4.9 5.2* 4.4 5.3*  CL 102 101 99* 99*  --  102  --  104  CO2 20*  --  24 27  --  23  --  22  GLUCOSE 98 100* 96 249*  --  222*  --  97  BUN 39* 38* 38* 39*  --  43*  --  31*  CREATININE 5.59* 5.60* 5.81* 6.06*  --  6.33*  --  5.73*  CALCIUM 9.0  --  8.8* 8.6*  --  8.3*  --  9.2  MG  --   --   --  1.8  --   --   --   --   AST 30  --   --   --   --   --   --   --   ALT 13*  --   --   --   --   --   --   --   ALKPHOS 92  --   --   --   --   --   --   --   BILITOT 1.3*  --   --   --   --   --   --   --    ------------------------------------------------------------------------------------------------------------------ No results for input(s): CHOL, HDL, LDLCALC, TRIG, CHOLHDL, LDLDIRECT in the last 72 hours.  No results found for:  HGBA1C ------------------------------------------------------------------------------------------------------------------ No results for input(s): TSH, T4TOTAL, T3FREE, THYROIDAB in the last 72 hours.  Invalid input(s): FREET3 ------------------------------------------------------------------------------------------------------------------  Recent Labs  01/23/16 0942  VITAMINB12 275    Coagulation profile  Recent Labs Lab 01/23/16 0942  INR 1.09    No results for input(s): DDIMER in the last 72 hours.  Cardiac Enzymes No results for input(s): CKMB, TROPONINI, MYOGLOBIN in the last 168 hours.  Invalid input(s): CK ------------------------------------------------------------------------------------------------------------------ No results found for: BNP  Inpatient Medications  Scheduled Meds: . cefTRIAXone (ROCEPHIN)  IV  1 g Intravenous Q24H  . Darbepoetin Alfa      . darbepoetin (ARANESP) injection - DIALYSIS  25 mcg Intravenous Q Wed-HD  . dorzolamide-timolol  1 drop Left Eye BID  . ferric gluconate (FERRLECIT/NULECIT) IV  125 mg Intravenous Q M,W,F-HD  . heparin  2,500 Units Dialysis Once in dialysis  . heparin  5,000 Units Dialysis Once in dialysis  . insulin aspart  0-15 Units Subcutaneous TID WC  . insulin glargine  25 Units Subcutaneous QHS  . ondansetron      . sodium chloride  1,000 mL Intravenous Once   Continuous Infusions:  PRN Meds:.sodium chloride, sodium chloride, heparin  Micro Results Recent Results (from the past 240 hour(s))  Urine culture     Status: None   Collection Time: 01/22/16  4:36 PM  Result Value Ref Range Status   Specimen Description URINE, CATHETERIZED  Final   Special Requests NONE  Final   Culture NO GROWTH  Final   Report Status 01/23/2016 FINAL  Final    Radiology Reports Dg Chest 2 View  01/22/2016  CLINICAL DATA:  Acute onset weakness and lightheadedness at 1:15 p.m. today while undergoing dialysis. EXAM: CHEST  2 VIEW  COMPARISON:  Single-view of the chest 09/01/2014 and CT chest 07/12/2014. FINDINGS: Dialysis catheter is noted. Aortic stent graft is in place. The lungs are clear. Heart size is normal. No pneumothorax or pleural effusion. IMPRESSION: No acute disease. Electronically Signed   By: Inge Rise M.D.   On: 01/22/2016 15:11    Time Spent in minutes  25   Louellen Molder M.D on 01/24/2016 at 5:15 PM  Between 7am to 7pm - Pager - [phone]  After 7pm go to [email or web address] - password [email or web address]  [facility] -  Office  [phone]

## 2016-01-24 NOTE — Consult Note (Addendum)
WOC ostomy follow up Consult requested for ostomy assistance.  Pt was admitted unexpectedly and did not bring his ostomy supplies from home.  He uses a [employer] 2 piece convex pouching system: 2 1/4 inches with a belt.  The current pouch is intact with a good seal and he states he obtains several days of wear time.  He has a [facility] which has been in place for approx 20 years or more.  This special type of ostomy must be drained with a 30cc tube several times a day.  Pt has the catheter supplies with him and states he is able to perform the drainage process and pouch changes without assistance.  Output: Mod amt liquid brown-green stool in the pouch. We do not carry a 2 piece convex pouching system in the [place]. Provided patient with 2 sets of 2 piece pouching systems and barrier rings for his use. He is very well-informed regarding ostomy care and denies further questions or need for assistance. Please re-consult if further assistance is needed.  Thank-you,  Julien Girt MSN, [place], [place], [place], [place]

## 2016-01-25 DIAGNOSIS — I9589 Other hypotension: Secondary | ICD-10-CM

## 2016-01-25 DIAGNOSIS — D7582 Heparin induced thrombocytopenia (HIT): Secondary | ICD-10-CM

## 2016-01-25 LAB — GLUCOSE, CAPILLARY
GLUCOSE-CAPILLARY: 150 mg/dL — AB (ref 65–99)
Glucose-Capillary: 107 mg/dL — ABNORMAL HIGH (ref 65–99)
Glucose-Capillary: 301 mg/dL — ABNORMAL HIGH (ref 65–99)
Glucose-Capillary: 153 mg/dL — ABNORMAL HIGH (ref 65–99)

## 2016-01-25 LAB — APTT
APTT: 37 s (ref 24–37)
APTT: 50 s — AB (ref 24–37)
APTT: 39 s — AB (ref 24–37)
aPTT: 64 seconds — ABNORMAL HIGH (ref 24–37)
aPTT: 37 seconds (ref 24–37)
aPTT: 35 seconds (ref 24–37)

## 2016-01-25 LAB — DIC (DISSEMINATED INTRAVASCULAR COAGULATION) PANEL
APTT: 46 s — AB (ref 24–37)
D DIMER QUANT: 6.12 ug{FEU}/mL — AB (ref 0.00–0.50)
INR: 1.23 (ref 0.00–1.49)
PROTHROMBIN TIME: 15.6 s — AB (ref 11.6–15.2)

## 2016-01-25 LAB — DIC (DISSEMINATED INTRAVASCULAR COAGULATION)PANEL
Fibrinogen: 491 mg/dL — ABNORMAL HIGH (ref 204–475)
Platelets: 70 10*3/uL — ABNORMAL LOW (ref 150–400)
Smear Review: NONE SEEN

## 2016-01-25 MED ORDER — OXYCODONE-ACETAMINOPHEN 5-325 MG PO TABS
1.0000 | ORAL_TABLET | Freq: Once | ORAL | Status: AC
  Administered 2016-01-26: 1 via ORAL
  Filled 2016-01-25: qty 1

## 2016-01-25 MED ORDER — ARGATROBAN 50 MG/50ML IV SOLN
1.5000 ug/kg/min | INTRAVENOUS | Status: DC
  Administered 2016-01-25: 1.3 ug/kg/min via INTRAVENOUS
  Administered 2016-01-26 – 2016-01-29 (×13): 1.5 ug/kg/min via INTRAVENOUS
  Filled 2016-01-25 (×15): qty 50

## 2016-01-25 NOTE — Care Management Important Message (Signed)
Important Message  Patient Details  Name: Travis Carlson MRN: [id number] Date of Birth: April 08, 1946   Medicare Important Message Given:  Yes    Loann Quill 01/25/2016, 10:00 AM

## 2016-01-25 NOTE — Progress Notes (Signed)
Inpatient Diabetes Program Recommendations  AACE/ADA: New Consensus Statement on Inpatient Glycemic Control (2015)  Target Ranges:  Prepandial:   less than 140 mg/dL      Peak postprandial:   less than 180 mg/dL (1-2 hours)      Critically ill patients:  140 - 180 mg/dL   Lab Results  Component Value Date   GLUCAP 301* 01/25/2016    Review of Glycemic Control  Inpatient Diabetes Program Recommendations: Add Novolog 3 units TID with meals for elevated postprandial cbgs.  Thank you  Raoul Pitch MSN, RN,CDE Inpatient Diabetes Coordinator [phone] (team pager)

## 2016-01-25 NOTE — Progress Notes (Signed)
PROGRESS NOTE                                                                                                                                                                                                             Patient Demographics:    Travis Carlson, is a 70 y.o. male, DOB - 1946/07/22, [id number]  Admit date - 01/22/2016   Admitting Physician Etta Quill, DO  Outpatient Primary MD for the patient is Parke Poisson, MD  LOS - 3  Outpatient Specialists: renal  Chief Complaint  Patient presents with  . Weakness       Brief Narrative  70 year old male with a history of Barrett's esophagus, ESRD, diabetes mellitus type 2, prostate cancer s/p TURP, ileostomy presented with severe generalized weakness after HD. According to the patient, no fluid was taken off during dialysis as he was under his dry weight. The patient was recently discharged from hospitalization at [facility] from 01/05/2016 through 01/13/2016. During that hospitalization, the patient presented with abdominal pain, severe hyperkalemia and was declared new ESRD. The patient was initiated on hemodialysis initially and a right-sided IJ PermCath was placed. The patient had his first outpatient dialysis on 01/15/2016. The patient was diagnosed with viral gastroenteritis and pyelonephritis. Urinalysis on 01/06/2016 showed TNTC WBC, but urine culture was not obtained when reviewed on CareEverywhere. The patient was discharged home with cephalexin for 15 more doses. The patient states that he has proximally 7 doses left on the day of this admission. In the emergency department, the patient was noted to have serum potassium up to 6.9. He was treated with insulin, D50, bicarbonate, and calcium chloride. Repeat potassium was 5.2.       Subjective:   Still quite weak. Did not order dialysis fairly yesterday due to low blood pressure and required some fluid  bolus.    Assessment  & Plan :   Hyperkalemia in the setting of ESRD  -pt endorses some dietary indiscretion. Now stable with dialysis. Counseled on diet adherence. Vein mapping done. For AV fistula placement on 6/20.  Generalized weakness -combination of hypovolemia and possible urinary tract infection . 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  Pyuria Has residual renal function and makes approximately 8-16 oz of urine daily -Empiric ceftriaxone . Has had multiple UTIs  in the last few months. cx negative.   Thrombocytopenia New finding. Was normal until HD started. 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 elevated fibrinigen, d-dimer and APTT. (?Early DIC).  Discussed with renal to avoid heparin with dialysis. Discussed with Dr.Granfortuna. Given picture of HITT , cemented to start argatroban until platelets normalized and then start him on coumadin. [place] DIC panel daily.  ESRD -Nephrology has been consulted for dialysis needs  Diabetes mellitus type 2 -Continue reduced dose Levemir, 25 units at bedtime -NovoLog sliding scale -check Hemoglobin A1c  History of familial polyposis syndrome -Status post colectomy with ostomy -Status post Whipple's procedure 1996           code Status: Full code  Family Communication  :none at bedside  Disposition Plan  :  Home once platelets normalized and switched to coumadin   Consults  :   Renal vascular  Procedures  :  HD   DVT Prophylaxis  :  SCD  Lab Results  Component Value Date   PLT 70* 01/25/2016    Antibiotics  :    Anti-infectives    Start     Dose/Rate Route Frequency Ordered Stop   01/23/16 0900  cefTRIAXone (ROCEPHIN) 1 g in dextrose 5 % 50 mL IVPB     1 g 100 mL/hr over 30 Minutes Intravenous Every 24 hours 01/23/16 0822          Objective:   Filed Vitals:   01/24/16 1345 01/24/16 1657 01/25/16 0453 01/25/16 0900  BP: 103/48 92/43 103/45  111/49  Pulse: 60 53 65 71  Temp: 97.8 F (36.6 C) 98.6 F (37 C) 97.8 F (36.6 C) 98 F (36.7 C)  TempSrc: Oral Oral Oral Oral  Resp: 16 18 19 19   Height:      Weight: 71.8 kg (158 lb 4.6 oz)     SpO2: 98% 98% 98%     Wt Readings from Last 3 Encounters:  01/24/16 71.8 kg (158 lb 4.6 oz)  07/20/14 94.4 kg (208 lb 1.8 oz)     Intake/Output Summary (Last 24 hours) at 01/25/16 1502 Last data filed at 01/25/16 0900  Gross per 24 hour  Intake 838.03 ml  Output      0 ml  Net 838.03 ml     Physical Exam  Gen: not in distress, Fatigue HEENT: no pallor, moist mucosa, supple neck Chest: clear b/l, no added sounds, HD catheter CVS: N S1&S2, no murmurs, rubs or gallop GI: soft, NT, ND, BS+ Musculoskeletal: warm, no edema, no ecchymosis or swelling CNS: alert and oriented,  non focal    Data Review:    CBC  Recent Labs Lab 01/22/16 1554 01/22/16 1602 01/23/16 0942 01/24/16 1230 01/25/16 0828  WBC 12.5*  --  9.4 7.7  --   HGB 9.9* 11.2* 9.3* 9.8*  --   HCT 32.2* 33.0* 30.8* 32.4*  --   PLT 48*  --  63* 67* 70*  MCV 86.1  --  86.5 87.1  --   MCH 26.5  --  26.1 26.3  --   MCHC 30.7  --  30.2 30.2  --   RDW 15.0  --  15.2 15.3  --   LYMPHSABS 2.8  --   --   --   --   MONOABS 0.4  --   --   --   --   EOSABS 0.6  --   --   --   --   BASOSABS 0.1  --   --   --   --  Chemistries   Recent Labs Lab 01/22/16 1554 01/22/16 1602 01/22/16 1724 01/22/16 2050 01/23/16 0143 01/23/16 0227 01/23/16 1500 01/24/16 1230  NA 135 135 132* 135  --  134*  --  136  K 5.9* 5.2* 6.9* 5.4* 4.9 5.2* 4.4 5.3*  CL 102 101 99* 99*  --  102  --  104  CO2 20*  --  24 27  --  23  --  22  GLUCOSE 98 100* 96 249*  --  222*  --  97  BUN 39* 38* 38* 39*  --  43*  --  31*  CREATININE 5.59* 5.60* 5.81* 6.06*  --  6.33*  --  5.73*  CALCIUM 9.0  --  8.8* 8.6*  --  8.3*  --  9.2  MG  --   --   --  1.8  --   --   --   --   AST 30  --   --   --   --   --   --   --   ALT 13*  --   --    --   --   --   --   --   ALKPHOS 92  --   --   --   --   --   --   --   BILITOT 1.3*  --   --   --   --   --   --   --    ------------------------------------------------------------------------------------------------------------------ No results for input(s): CHOL, HDL, LDLCALC, TRIG, CHOLHDL, LDLDIRECT in the last 72 hours.  No results found for: HGBA1C ------------------------------------------------------------------------------------------------------------------ No results for input(s): TSH, T4TOTAL, T3FREE, THYROIDAB in the last 72 hours.  Invalid input(s): FREET3 ------------------------------------------------------------------------------------------------------------------  Recent Labs  01/23/16 0942  VITAMINB12 275    Coagulation profile  Recent Labs Lab 01/23/16 0942 01/25/16 0828  INR 1.09 1.23     Recent Labs  01/25/16 0828  DDIMER 6.12*    Cardiac Enzymes No results for input(s): CKMB, TROPONINI, MYOGLOBIN in the last 168 hours.  Invalid input(s): CK ------------------------------------------------------------------------------------------------------------------ No results found for: BNP  Inpatient Medications  Scheduled Meds: . cefTRIAXone (ROCEPHIN)  IV  1 g Intravenous Q24H  . darbepoetin (ARANESP) injection - DIALYSIS  25 mcg Intravenous Q Wed-HD  . dorzolamide-timolol  1 drop Left Eye BID  . ferric gluconate (FERRLECIT/NULECIT) IV  125 mg Intravenous Q M,W,F-HD  . insulin aspart  0-15 Units Subcutaneous TID WC  . insulin glargine  25 Units Subcutaneous QHS   Continuous Infusions: . argatroban 1.5 mcg/kg/min (01/25/16 1340)   PRN Meds:.sodium chloride, sodium chloride  Micro Results Recent Results (from the past 240 hour(s))  Urine culture     Status: None   Collection Time: 01/22/16  4:36 PM  Result Value Ref Range Status   Specimen Description URINE, CATHETERIZED  Final   Special Requests NONE  Final   Culture NO GROWTH  Final     Report Status 01/23/2016 FINAL  Final    Radiology Reports Dg Chest 2 View  01/22/2016  CLINICAL DATA:  Acute onset weakness and lightheadedness at 1:15 p.m. today while undergoing dialysis. EXAM: CHEST  2 VIEW COMPARISON:  Single-view of the chest 09/01/2014 and CT chest 07/12/2014. FINDINGS: Dialysis catheter is noted. Aortic stent graft is in place. The lungs are clear. Heart size is normal. No pneumothorax or pleural effusion. IMPRESSION: No acute disease. Electronically Signed   By: Inge Rise M.D.   On: 01/22/2016 15:11  Time Spent in minutes  25   Louellen Molder M.D on 01/25/2016 at 3:02 PM  Between 7am to 7pm - Pager - [phone]  After 7pm go to [email or web address] - password [facility]  [facility] -  Office  [phone]

## 2016-01-25 NOTE — Progress Notes (Signed)
ANTICOAGULATION CONSULT NOTE - Follow-up Consult  Pharmacy Consult for argatroban Indication: suspected HIT  Allergies  Allergen Reactions  . Heparin Other (See Comments)    Possible HIT (Heparin antibody=2.823)  . Bactrim [Sulfamethoxazole-Trimethoprim]     COULDN'T MOVE. REAL WEAK  . Metformin And Related     Real weak    Patient Measurements: Height: 5\' 8"  (172.7 cm) Weight: 158 lb 4.6 oz (71.8 kg) IBW/kg (Calculated) : 68.4  Vital Signs: Temp: 98.6 F (37 C) (06/14 1657) Temp Source: Oral (06/14 1657) BP: 92/43 mmHg (06/14 1657) Pulse Rate: 53 (06/14 1657)  Labs:  Recent Labs  01/22/16 1554 01/22/16 1602  01/22/16 2050 01/23/16 0227 01/23/16 0942 01/24/16 1230 01/24/16 2322  HGB 9.9* 11.2*  --   --   --  9.3* 9.8*  --   HCT 32.2* 33.0*  --   --   --  30.8* 32.4*  --   PLT 48*  --   --   --   --  63* 67*  --   APTT  --   --   --   --   --  30  --  29  LABPROT  --   --   --   --   --  14.3  --   --   INR  --   --   --   --   --  1.09  --   --   CREATININE 5.59* 5.60*  < > 6.06* 6.33*  --  5.73*  --   < > = values in this interval not displayed.  Estimated Creatinine Clearance: 11.6 mL/min (by C-G formula based on Cr of 5.73).  Assessment: 70 yo male with thrombocytopenia. Heparin antibody is 2.8 (levels over 2.0 have a high correlation with a positive SRA). Heparin sq given 6/12 and 6/13. Platelet count= 67 with trend up however platelet count was 48 at admit (HIT possibly developed while on heparin with HD as outpatient). 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 aPTT = 37 sec on 1 mcg/kg/min. No issues with line or bleeding reported per RN.  Goal of Therapy:  aPTT 50-90 seconds Monitor platelets by anticoagulation protocol: Yes   Plan:  -Increase argatroban to 1.8mcg/kg/min -aPTT in 2 hours -SRA ordered 6/14 -A heparin allergy has been documented but will need to follow SRA results  Sherlon Handing, PharmD, BCPS Clinical pharmacist, pager [phone]  01/25/2016 12:02  AM

## 2016-01-25 NOTE — Progress Notes (Addendum)
[place] for argatroban Indication: suspected HIT  Allergies  Allergen Reactions  . Heparin Other (See Comments)    Possible HIT (Heparin antibody=2.823)  . Bactrim [Sulfamethoxazole-Trimethoprim]     COULDN'T MOVE. REAL WEAK  . Metformin And Related     Real weak    Patient Measurements: Height: 5\' 8"  (172.7 cm) Weight: 158 lb 4.6 oz (71.8 kg) IBW/kg (Calculated) : 68.4  Vital Signs: Temp: 98 F (36.7 C) (06/15 0900) Temp Source: Oral (06/15 0900) BP: 111/49 mmHg (06/15 0900) Pulse Rate: 71 (06/15 0900)  Labs:  Recent Labs  01/22/16 2050 01/23/16 0227 01/23/16 0942 01/24/16 1230  01/25/16 0828 01/25/16 1126 01/25/16 1605  HGB  --   --  9.3* 9.8*  --   --   --   --   HCT  --   --  30.8* 32.4*  --   --   --   --   PLT  --   --  63* 67*  --  70*  --   --   APTT  --   --  30  --   < > 46* 50* 39*  LABPROT  --   --  14.3  --   --  15.6*  --   --   INR  --   --  1.09  --   --  1.23  --   --   CREATININE 6.06* 6.33*  --  5.73*  --   --   --   --   < > = values in this interval not displayed.  Estimated Creatinine Clearance: 11.6 mL/min (by C-G formula based on Cr of 5.73).   Medical History: Past Medical History  Diagnosis Date  . 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 CKD (chronic kidney disease)   . History of benign colon tumor   . DM2 (diabetes mellitus, type 2) ([place])   . ESRD (end stage renal disease) (H[facility])     Medications:  Scheduled:  . cefTRIAXone (ROCEPHIN)  IV  1 g Intravenous Q24H  . darbepoetin (ARANESP) injection - DIALYSIS  25 mcg Intravenous Q Wed-HD  . dorzolamide-timolol  1 drop Left Eye BID  . ferric gluconate (FERRLECIT/NULECIT) IV  125 mg Intravenous Q M,W,F-HD  . insulin aspart  0-15 Units Subcutaneous TID WC  . insulin glargine  25 Units Subcutaneous QHS   Infusions:  . argatroban 1.5 mcg/kg/min (01/25/16 1340)    Assessment: 70 yo male with thrombocytopenia. Heparin antibody is  2.8 (levels over 2.0 have a high correlation with a positive SRA). Heparin sq given 6/12 and 6/13. 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  APTT this afternoon is SUBtherapeutic (aPTT 39 << 50, goal of 50-90 seconds). Confirmed with RN that argatroban was infusing without any issues - it is running through an IV placed in the hand. The RN did report that it was also running with saline at a rate of KVO and this was turned off between the levels - so this could have effected the amount of argatroban being infused.   Goal of Therapy:  aPTT 50-90 seconds Monitor platelets by anticoagulation protocol: Yes   Plan:  1. Increase argatroban by to 1.9 mcg/kg/mi (~30% increase) 2. Recheck another aPTT in 2 hours after rate change 3. SRA ordered 6/14 (currently  in process) 4. A heparin allergy has been documented but will need to follow SRA results  Alycia Rossetti, PharmD, BCPS Clinical Pharmacist Pager: [phone] 01/25/2016 5:13 PM    ---------------------------------------------------------------------------------------------- Addendum:  aPTT drawn this evening at 1830 (1 hr early) and 2100 (stat repeat) were both SUBtherapeutic at 37 and 35 respectively (goal of 50-90 seconds). I went up to visit the room and found that the drip was infusing inappropriately. The IV was being run as a secondary and the patient was actually getting 8.2 ml/hr of NS instead of argatroban.   The RN Malachy Mood) was brought into the room to review the lines and change out with a new tubing set. The drip rate will be lowered back to 1.5 mcg/kg/hr. It is unclear at what point the IV was switched over but the RN Maudie Mercury) earlier today did mention that she adjusted the drip/tubing and labs from 1600, 1830, and 2100 all appear to be indicative of NS running instead of saline.   Goal of Therapy:  aPTT 50-90  seconds Monitor platelets by anticoagulation protocol: Yes   Plan:  1. Reduce argatroban back down to 1.5 mcg/kg/hr since it does not appear as if we have gotten a true aPTT at this rate 2. Will recheck an aPTT in 2 hours to re-access the drip rate 3. Will monitor for any signs/symptoms of bleeding  Alycia Rossetti, PharmD, BCPS Clinical Pharmacist Pager: [phone] 01/25/2016 10:31 PM

## 2016-01-25 NOTE — Progress Notes (Signed)
Travis Carlson Progress Note   Subjective: "I'm still feeling a little weak". Anxious, concerned about being DC'd home-has no transportation.    Objective Filed Vitals:   01/24/16 1345 01/24/16 1657 01/25/16 0453 01/25/16 0900  BP: 103/48 92/43 103/45 111/49  Pulse: 60 53 65 71  Temp: 97.8 F (36.6 C) 98.6 F (37 C) 97.8 F (36.6 C) 98 F (36.7 C)  TempSrc: Oral Oral Oral Oral  Resp: 16 18 19 19   Height:      Weight: 71.8 kg (158 lb 4.6 oz)     SpO2: 98% 98% 98%    Physical Exam General: pleasant, NAD Heart: S1, S2, RRR Lungs: CTA A/P Abdomen:Soft nontender. RLQ ileostomy Extremities:No LE edema Dialysis Access: RIJ TDC drsg CDI  Dialysis: [facility] MWF 4 hours EDW 75 kg 400/800 2.0K/2.25 Ca  Heparin 5000 units IV per treatment Venofer 100 mg IV X 10 (Tsat 18 01/17/16)  Additional Objective Labs: Basic Metabolic Panel:  Recent Labs Lab 01/22/16 2050  01/23/16 0227 01/23/16 1500 01/24/16 1230  NA 135  --  134*  --  136  K 5.4*  < > 5.2* 4.4 5.3*  CL 99*  --  102  --  104  CO2 27  --  23  --  22  GLUCOSE 249*  --  222*  --  97  BUN 39*  --  43*  --  31*  CREATININE 6.06*  --  6.33*  --  5.73*  CALCIUM 8.6*  --  8.3*  --  9.2  PHOS  --   --   --   --  5.8*  < > = values in this interval not displayed. Liver Function Tests:  Recent Labs Lab 01/22/16 1554 01/24/16 1230  AST 30  --   ALT 13*  --   ALKPHOS 92  --   BILITOT 1.3*  --   PROT 7.4  --   ALBUMIN 3.5 3.4*   CBC:  Recent Labs Lab 01/22/16 1554 01/22/16 1602 01/23/16 0942 01/24/16 1230 01/25/16 0828  WBC 12.5*  --  9.4 7.7  --   NEUTROABS 8.6*  --   --   --   --   HGB 9.9* 11.2* 9.3* 9.8*  --   HCT 32.2* 33.0* 30.8* 32.4*  --   MCV 86.1  --  86.5 87.1  --   PLT 48*  --  63* 67* 70*   Blood Culture    Component Value Date/Time   SDES URINE, CATHETERIZED 01/22/2016 1636   SPECREQUEST NONE 01/22/2016 1636   CULT NO GROWTH 01/22/2016 1636   REPTSTATUS 01/23/2016  FINAL 01/22/2016 1636    Cardiac Enzymes: No results for input(s): CKTOTAL, CKMB, CKMBINDEX, TROPONINI in the last 168 hours. CBG:  Recent Labs Lab 01/24/16 0735 01/24/16 1139 01/24/16 1645 01/24/16 2011 01/25/16 0730  GLUCAP 234* 124* 214* 229* 107*   Iron Studies: No results for input(s): IRON, TIBC, TRANSFERRIN, FERRITIN in the last 72 hours. @lablastinr3 @ Studies/Results: No results found. Medications: . argatroban 1.3 mcg/kg/min (01/25/16 0400)   . cefTRIAXone (ROCEPHIN)  IV  1 g Intravenous Q24H  . darbepoetin (ARANESP) injection - DIALYSIS  25 mcg Intravenous Q Wed-HD  . dorzolamide-timolol  1 drop Left Eye BID  . ferric gluconate (FERRLECIT/NULECIT) IV  125 mg Intravenous Q M,W,F-HD  . insulin aspart  0-15 Units Subcutaneous TID WC  . insulin glargine  25 Units Subcutaneous QHS    Assessment/Plan: 1. Hyperkalemia/Weakness:Hyperkalemia resolved with HD Last K+5.3 2. 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 Has been started on argatroban by pharmacy. NO HEPARIN WITH HD.  3. Pyuria: WBC initially 12.5 now 7.7.  Tmax 98  On rocephin, urine cx NG 2 days. MRSA neg 4. ESRD - MWF @ [facility]. HD yesterday-signed off early D/T "I just felt bad". SBP down to 80s. Needs EDW lowered. Having sig symptoms on dialysis w/o attempted fluid removal. Consider possible allergy to HD Dialysis membrane. Double rinse Dialyzer prior to next HD.   5. Hypotension/volume - SBP 90s-110s. Last Wt 71.8. Needs lower OP EDW. HD tomorrow. Reset EDW to 71 kg. Minimal UF tomorrow.  6. Anemia - HGB 9.3 01/24/16 ESA started yesterday. Cont Fe Load. Follow HGB 7. Metabolic bone disease - No binders/VDRA at present. Ca 9.2 C Ca 9.2 Phos 4.5 PTH 210 8. Nutrition - Albumin 3.5. Renal/Carb mod diet.  9. DM: per primary.  9. Permanent HD access: VVS consulted. For perm access next week.    Rita H. Owens Shark, NP-C 01/24/2016, 10:38 AM  [facility] Beeper [phone]   Pt seen, examined  and agree w A/P as above.  Kelly Splinter MD [employer] pager [phone]    cell [phone] 01/25/2016, 12:40 PM

## 2016-01-25 NOTE — Progress Notes (Signed)
Patient complaining of hip pain. On pain meds for chronic pain prescribed by PCP. 2 pages out to Dr. Clementeen Graham.

## 2016-01-25 NOTE — Progress Notes (Signed)
[place] for argatroban Indication: suspected HIT  Allergies  Allergen Reactions  . Heparin Other (See Comments)    Possible HIT (Heparin antibody=2.823)  . Bactrim [Sulfamethoxazole-Trimethoprim]     COULDN'T MOVE. REAL WEAK  . Metformin And Related     Real weak    Patient Measurements: Height: 5\' 8"  (172.7 cm) Weight: 158 lb 4.6 oz (71.8 kg) IBW/kg (Calculated) : 68.4  Vital Signs: Temp: 98 F (36.7 C) (06/15 0900) Temp Source: Oral (06/15 0900) BP: 111/49 mmHg (06/15 0900) Pulse Rate: 71 (06/15 0900)  Labs:  Recent Labs  01/22/16 1602  01/22/16 2050 01/23/16 0227 01/23/16 0942 01/24/16 1230  01/25/16 0224 01/25/16 0651 01/25/16 0828  HGB 11.2*  --   --   --  9.3* 9.8*  --   --   --   --   HCT 33.0*  --   --   --  30.8* 32.4*  --   --   --   --   PLT  --   --   --   --  63* 67*  --   --   --  70*  APTT  --   --   --   --  30  --   < > 37 64* 46*  LABPROT  --   --   --   --  14.3  --   --   --   --  15.6*  INR  --   --   --   --  1.09  --   --   --   --  1.23  CREATININE 5.60*  < > 6.06* 6.33*  --  5.73*  --   --   --   --   < > = values in this interval not displayed.  Estimated Creatinine Clearance: 11.6 mL/min (by C-G formula based on Cr of 5.73).   Medical History: Past Medical History  Diagnosis Date  . 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 CKD (chronic kidney disease)   . History of benign colon tumor   . DM2 (diabetes mellitus, type 2) ([place])   . ESRD (end stage renal disease) ([facility])     Medications:  Scheduled:  . cefTRIAXone (ROCEPHIN)  IV  1 g Intravenous Q24H  . darbepoetin (ARANESP) injection - DIALYSIS  25 mcg Intravenous Q Wed-HD  . dorzolamide-timolol  1 drop Left Eye BID  . ferric gluconate (FERRLECIT/NULECIT) IV  125 mg Intravenous Q M,W,F-HD  . insulin aspart  0-15 Units Subcutaneous TID WC  . insulin glargine  25 Units Subcutaneous QHS   Infusions:  . argatroban 1.3 mcg/kg/min  (01/25/16 0400)    Assessment: 70 yo male with thrombocytopenia. Heparin antibody is 2.8 (levels over 2.0 have a high correlation with a positive SRA). Heparin sq given 6/12 and 6/13. 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  aPTT = 64 sec on 1.3 mcg/kg/min. No issues with line or bleeding reported per RN. PLTs 70 this morning.    Goal of Therapy:  aPTT 50-90 seconds Monitor platelets by anticoagulation protocol: Yes   Plan:  1. Continue argatroban at 1.3 mcg/kg/min 2. Confirmatory aPTT in 3 - 4 hours  3. SRA ordered 6/14 (currently in process) 4. A heparin allergy has been documented but will need to follow  SRA results  Vincenza Hews, PharmD, BCPS 01/25/2016, 10:04 AM Pager: [phone]

## 2016-01-25 NOTE — Care Management Note (Signed)
Case Management Note  Patient Details  Name: Travis Carlson MRN: [id number] Date of Birth: Jun 17, 1946  Subjective/Objective:      CM following for progression and d/c planning.               Action/Plan: 01/25/2016 CM following pt progression. Will assist with any d/c needs. No needs identified at this point.   Expected Discharge Date:                  Expected Discharge Plan:  Home/Self Care  In-House Referral:  NA  Discharge planning Services  CM Consult  Post Acute Care Choice:  NA Choice offered to:  NA  DME Arranged:    DME Agency:     HH Arranged:    HH Agency:     Status of Service:  In process, will continue to follow  Medicare Important Message Given:  Yes Date Medicare IM Given:    Medicare IM give by:    Date Additional Medicare IM Given:    Additional Medicare Important Message give by:     If discussed at [place] of Stay Meetings, dates discussed:    Additional Comments:  Adron Bene, RN 01/25/2016, 12:34 PM

## 2016-01-25 NOTE — Consult Note (Signed)
[facility]  Subjective  - felt bad with dialysis yesterday   Objective 103/45 65 97.8 F (36.6 C) (Oral) 19 98%  Intake/Output Summary (Last 24 hours) at 01/25/16 [id number] Last data filed at 01/25/16 0630  Gross per 24 hour  Intake 1198.03 ml  Output   -950 ml  Net 2148.03 ml   2+ brachial radial bilaterally  Assessment/Planning: Vein map reviewed poor quality vein both arms Will plan right arm AV graft next Tuesday 6/20 Ok to d/c from my standpoint prior to this if medically stable  Ruta Hinds 01/25/2016 8:22 AM --  Laboratory Lab Results:  Recent Labs  01/23/16 0942 01/24/16 1230  WBC 9.4 7.7  HGB 9.3* 9.8*  HCT 30.8* 32.4*  PLT 63* 67*   BMET  Recent Labs  01/23/16 0227 01/23/16 1500 01/24/16 1230  NA 134*  --  136  K 5.2* 4.4 5.3*  CL 102  --  104  CO2 23  --  22  GLUCOSE 222*  --  97  BUN 43*  --  31*  CREATININE 6.33*  --  5.73*  CALCIUM 8.3*  --  9.2    COAG Lab Results  Component Value Date   INR 1.09 01/23/2016   INR 1.44 07/12/2014   INR 1.20 07/12/2014   No results found for: PTT

## 2016-01-25 NOTE — Progress Notes (Signed)
[place] for argatroban Indication: suspected HIT  Allergies  Allergen Reactions  . Heparin Other (See Comments)    Possible HIT (Heparin antibody=2.823)  . Bactrim [Sulfamethoxazole-Trimethoprim]     COULDN'T MOVE. REAL WEAK  . Metformin And Related     Real weak    Patient Measurements: Height: 5\' 8"  (172.7 cm) Weight: 158 lb 4.6 oz (71.8 kg) IBW/kg (Calculated) : 68.4  Vital Signs: Temp: 98 F (36.7 C) (06/15 0900) Temp Source: Oral (06/15 0900) BP: 111/49 mmHg (06/15 0900) Pulse Rate: 71 (06/15 0900)  Labs:  Recent Labs  01/22/16 1602  01/22/16 2050 01/23/16 0227 01/23/16 0942 01/24/16 1230  01/25/16 0651 01/25/16 0828 01/25/16 1126  HGB 11.2*  --   --   --  9.3* 9.8*  --   --   --   --   HCT 33.0*  --   --   --  30.8* 32.4*  --   --   --   --   PLT  --   --   --   --  63* 67*  --   --  70*  --   APTT  --   --   --   --  30  --   < > 64* 46* 50*  LABPROT  --   --   --   --  14.3  --   --   --  15.6*  --   INR  --   --   --   --  1.09  --   --   --  1.23  --   CREATININE 5.60*  < > 6.06* 6.33*  --  5.73*  --   --   --   --   < > = values in this interval not displayed.  Estimated Creatinine Clearance: 11.6 mL/min (by C-G formula based on Cr of 5.73).   Medical History: Past Medical History  Diagnosis Date  . 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 CKD (chronic kidney disease)   . History of benign colon tumor   . DM2 (diabetes mellitus, type 2) ([place])   . ESRD (end stage renal disease) (H[facility])     Medications:  Scheduled:  . cefTRIAXone (ROCEPHIN)  IV  1 g Intravenous Q24H  . darbepoetin (ARANESP) injection - DIALYSIS  25 mcg Intravenous Q Wed-HD  . dorzolamide-timolol  1 drop Left Eye BID  . ferric gluconate (FERRLECIT/NULECIT) IV  125 mg Intravenous Q M,W,F-HD  . insulin aspart  0-15 Units Subcutaneous TID WC  . insulin glargine  25 Units Subcutaneous QHS   Infusions:  . argatroban 1.3 mcg/kg/min  (01/25/16 0400)    Assessment: 70 yo male with thrombocytopenia. Heparin antibody is 2.8 (levels over 2.0 have a high correlation with a positive SRA). Heparin sq given 6/12 and 6/13. 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  Confirmatory has trended down from 64 to aPTT. No issues infusion.    Goal of Therapy:  aPTT 50-90 seconds Monitor platelets by anticoagulation protocol: Yes   Plan:  1. Based on argatroban trend will increase to 1.5 mcg/kg/min 2. APTT in around 2 hours 3. SRA ordered 6/14 (currently in process) 4. A heparin allergy has been documented but will need to follow SRA results  Vincenza Hews, PharmD, BCPS 01/25/2016,  1:04 PM Pager: [phone]

## 2016-01-26 ENCOUNTER — Inpatient Hospital Stay (HOSPITAL_COMMUNITY): Payer: Medicare Other

## 2016-01-26 DIAGNOSIS — I959 Hypotension, unspecified: Secondary | ICD-10-CM

## 2016-01-26 LAB — CBC
HEMATOCRIT: 28 % — AB (ref 39.0–52.0)
Hemoglobin: 8.5 g/dL — ABNORMAL LOW (ref 13.0–17.0)
MCH: 26.3 pg (ref 26.0–34.0)
MCHC: 30.4 g/dL (ref 30.0–36.0)
MCV: 86.7 fL (ref 78.0–100.0)
PLATELETS: 92 10*3/uL — AB (ref 150–400)
RBC: 3.23 MIL/uL — AB (ref 4.22–5.81)
RDW: 15.1 % (ref 11.5–15.5)
WBC: 7.9 10*3/uL (ref 4.0–10.5)

## 2016-01-26 LAB — ECHOCARDIOGRAM COMPLETE
Ao-asc: 33 cm
CHL CUP MV DEC (S): 278
EERAT: 6.11
EWDT: 278 ms
FS: 48 % — AB (ref 28–44)
HEIGHTINCHES: 68 in
IVS/LV PW RATIO, ED: 0.87
LA diam end sys: 30 mm
LA diam index: 1.62 cm/m2
LA vol A4C: 44.5 ml
LA vol: 42.1 mL
LASIZE: 30 mm
LAVOLIN: 22.8 mL/m2
LDCA: 4.15 cm2
LV E/e' medial: 6.11
LV PW d: 9.9 mm — AB (ref 0.6–1.1)
LV TDI E'LATERAL: 11.2
LV e' LATERAL: 11.2 cm/s
LVEEAVG: 6.11
LVOT diameter: 23 mm
MV pk A vel: 73.7 m/s
MVPKEVEL: 68.4 m/s
TAPSE: 23.3 mm
TDI e' medial: 7.72
WEIGHTICAEL: 2603.19 [oz_av]

## 2016-01-26 LAB — RENAL FUNCTION PANEL
Albumin: 3.4 g/dL — ABNORMAL LOW (ref 3.5–5.0)
Anion gap: 10 (ref 5–15)
BUN: 48 mg/dL — ABNORMAL HIGH (ref 6–20)
CO2: 17 mmol/L — ABNORMAL LOW (ref 22–32)
Calcium: 9 mg/dL (ref 8.9–10.3)
Chloride: 106 mmol/L (ref 101–111)
Creatinine, Ser: 7.15 mg/dL — ABNORMAL HIGH (ref 0.61–1.24)
GFR calc Af Amer: 8 mL/min — ABNORMAL LOW (ref >60)
GFR calc non Af Amer: 7 mL/min — ABNORMAL LOW (ref >60)
Glucose, Bld: 175 mg/dL — ABNORMAL HIGH (ref 65–99)
Phosphorus: 6.4 mg/dL — ABNORMAL HIGH (ref 2.5–4.6)
Potassium: 5.5 mmol/L — ABNORMAL HIGH (ref 3.5–5.1)
Sodium: 133 mmol/L — ABNORMAL LOW (ref 135–145)

## 2016-01-26 LAB — TSH: TSH: 0.924 u[IU]/mL (ref 0.350–4.500)

## 2016-01-26 LAB — APTT
APTT: 84 s — AB (ref 24–37)
APTT: 62 s — AB (ref 24–37)
APTT: 57 s — AB (ref 24–37)

## 2016-01-26 LAB — GLUCOSE, CAPILLARY
GLUCOSE-CAPILLARY: 249 mg/dL — AB (ref 65–99)
GLUCOSE-CAPILLARY: 107 mg/dL — AB (ref 65–99)
Glucose-Capillary: 118 mg/dL — ABNORMAL HIGH (ref 65–99)

## 2016-01-26 LAB — DIC (DISSEMINATED INTRAVASCULAR COAGULATION) PANEL
INR: 2.14 — AB (ref 0.00–1.49)
PROTHROMBIN TIME: 23.8 s — AB (ref 11.6–15.2)

## 2016-01-26 LAB — DIC (DISSEMINATED INTRAVASCULAR COAGULATION)PANEL
D-Dimer, Quant: 3.19 ug/mL-FEU — ABNORMAL HIGH (ref 0.00–0.50)
Fibrinogen: 430 mg/dL (ref 204–475)
Platelets: 90 10*3/uL — ABNORMAL LOW (ref 150–400)
Smear Review: NONE SEEN
aPTT: >200 seconds (ref 24–37)

## 2016-01-26 MED ORDER — ANTICOAGULANT SODIUM CITRATE 4% (200MG/5ML) IV SOLN
5.0000 mL | Status: DC | PRN
  Administered 2016-01-26: 5 mL via INTRAVENOUS_CENTRAL
  Filled 2016-01-26 (×2): qty 250

## 2016-01-26 MED ORDER — SODIUM CHLORIDE 0.9 % IV SOLN
125.0000 mg | INTRAVENOUS | Status: DC
  Administered 2016-01-26 – 2016-01-31 (×3): 125 mg via INTRAVENOUS
  Filled 2016-01-26 (×6): qty 10

## 2016-01-26 MED ORDER — RENA-VITE PO TABS
1.0000 | ORAL_TABLET | Freq: Every day | ORAL | Status: DC
  Administered 2016-01-27 – 2016-01-30 (×4): 1 via ORAL
  Filled 2016-01-26 (×5): qty 1

## 2016-01-26 MED ORDER — SODIUM CHLORIDE 0.9 % IV SOLN
INTRAVENOUS | Status: DC
  Administered 2016-01-27 – 2016-01-29 (×4): via INTRAVENOUS
  Administered 2016-01-31: 1000 mL via INTRAVENOUS

## 2016-01-26 MED ORDER — LIDOCAINE-PRILOCAINE 2.5-2.5 % EX CREA: 1.0000 "application " | TOPICAL_CREAM | CUTANEOUS | Status: DC | PRN

## 2016-01-26 MED ORDER — SODIUM CHLORIDE 0.9 % IV BOLUS (SEPSIS): 500.0000 mL | Freq: Once | INTRAVENOUS | Status: DC

## 2016-01-26 MED ORDER — SODIUM CHLORIDE 0.9 % IV SOLN: 100.0000 mL | INTRAVENOUS | Status: DC | PRN

## 2016-01-26 MED ORDER — COSYNTROPIN 0.25 MG IJ SOLR
0.2500 mg | Freq: Once | INTRAMUSCULAR | Status: DC
  Filled 2016-01-26: qty 0.25

## 2016-01-26 MED ORDER — DIPHENHYDRAMINE HCL 50 MG/ML IJ SOLN
INTRAMUSCULAR | Status: AC
  Filled 2016-01-26: qty 1

## 2016-01-26 MED ORDER — PENTAFLUOROPROP-TETRAFLUOROETH EX AERO: 1.0000 "application " | INHALATION_SPRAY | CUTANEOUS | Status: DC | PRN

## 2016-01-26 MED ORDER — DIPHENHYDRAMINE HCL 50 MG/ML IJ SOLN
12.5000 mg | Freq: Once | INTRAMUSCULAR | Status: AC
  Administered 2016-01-26: 12.5 mg via INTRAVENOUS

## 2016-01-26 MED ORDER — LIDOCAINE HCL (PF) 1 % IJ SOLN: 5.0000 mL | INTRAMUSCULAR | Status: DC | PRN

## 2016-01-26 MED ORDER — SODIUM CHLORIDE 0.9 % IV SOLN
Freq: Once | INTRAVENOUS | Status: AC
  Administered 2016-01-26: 12:00:00 via INTRAVENOUS

## 2016-01-26 MED ORDER — SEVELAMER CARBONATE 800 MG PO TABS
800.0000 mg | ORAL_TABLET | Freq: Three times a day (TID) | ORAL | Status: DC
  Administered 2016-01-26 – 2016-01-27 (×3): 800 mg via ORAL
  Filled 2016-01-26 (×3): qty 1

## 2016-01-26 NOTE — Progress Notes (Signed)
MD called back to update regarding BP/patient status on treatment. Currently, SBP in low 90s with patient describing feeling wiped out and slow. UF remains off. Discussed administration of NS boluses with PA. PA advised to give boluses as needed due to patient being under DW.

## 2016-01-26 NOTE — Progress Notes (Signed)
NT inform that pt is refusing to have vital signs and CBG taken I spoke with pt and informed him that he is in hospital and that vital signs and CBG are needed for his treatment. His response was I am sleeping I told him that he has 25 units of Lantus to be given and I need CBG in order to treat him no response. Will attempt at later time. Arthor Captain LPN

## 2016-01-26 NOTE — Progress Notes (Signed)
[place] for argatroban Indication: suspected HIT  Allergies  Allergen Reactions  . Heparin Other (See Comments)    Possible HIT (Heparin antibody=2.823)  . Bactrim [Sulfamethoxazole-Trimethoprim]     COULDN'T MOVE. REAL WEAK  . Metformin And Related     Real weak    Patient Measurements: Height: 5\' 8"  (172.7 cm) Weight: 157 lb 13.6 oz (71.6 kg) IBW/kg (Calculated) : 68.4  Vital Signs: Temp: 98.3 F (36.8 C) (06/16 1330) Temp Source: Oral (06/16 1330) BP: 106/47 mmHg (06/16 1330) Pulse Rate: 67 (06/16 1330)  Labs:  Recent Labs  01/24/16 1230  01/25/16 0828  01/26/16 0032 01/26/16 0737 01/26/16 0738 01/26/16 1248  HGB 9.8*  --   --   --   --   --  8.5*  --   HCT 32.4*  --   --   --   --   --  28.0*  --   PLT 67*  --  70*  --   --   --  90*  92*  --   APTT  --   < > 46*  < > 57*  --  >200* 84*  LABPROT  --   --  15.6*  --   --   --  23.8*  --   INR  --   --  1.23  --   --   --  2.14*  --   CREATININE 5.73*  --   --   --   --  7.15*  --   --   < > = values in this interval not displayed.  Estimated Creatinine Clearance: 9.3 mL/min (by C-G formula based on Cr of 7.15).   Assessment: 69 yo male with thrombocytopenia. Heparin antibody is 2.8 (levels over 2.0 have a high correlation with a positive SRA). Heparin sq given 6/12 and 6/13. Platelet count = 67  with trend up however platelet count was 48 at admit (HIT possibly  developed while on heparin with HD as outpatient). 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  Argatroban infusion continues today w/o any noted issues. Currently the drip is running at 1.5 mcg/kg/min or 6.5 ml/hr.  The last two aPTT are noted to be 57 and 84 (aPTT of > 200 appears to have been drawn incorrectly). CBC stable with improving platelets.   Goal of Therapy:  aPTT 50-90 seconds Monitor platelets by anticoagulation protocol: Yes   Plan:  1. Continue argatroban at 1.5 mcg/kg/min for now; will redraw level later  today for completeness  2. Daily PTT(goal 50-90) 3. F/u transition to warfarin when PLTs greater than or equal to 150,000 or back to baseline 4. Please note that argatroban infusion falsely elevates the INR (drug lab interaction) and it not reflective of the patient's "true" INR 5. SRA is in process   Vincenza Hews, PharmD, BCPS 01/26/2016, 1:56 PM Pager: [phone]

## 2016-01-26 NOTE — Progress Notes (Signed)
[place] for argatroban Indication: suspected HIT  Allergies  Allergen Reactions  . Heparin Other (See Comments)    Possible HIT (Heparin antibody=2.823)  . Bactrim [Sulfamethoxazole-Trimethoprim]     COULDN'T MOVE. REAL WEAK  . Metformin And Related     Real weak    Patient Measurements: Height: 5\' 8"  (172.7 cm) Weight: 158 lb 4.6 oz (71.8 kg) IBW/kg (Calculated) : 68.4  Vital Signs: Temp: 98.6 F (37 C) (06/15 2020) Temp Source: Oral (06/15 2020) BP: 118/61 mmHg (06/15 2020) Pulse Rate: 65 (06/15 2020)  Labs:  Recent Labs  01/23/16 0227 01/23/16 0942 01/24/16 1230  01/25/16 0828  01/25/16 1828 01/25/16 2105 01/26/16 0032  HGB  --  9.3* 9.8*  --   --   --   --   --   --   HCT  --  30.8* 32.4*  --   --   --   --   --   --   PLT  --  63* 67*  --  70*  --   --   --   --   APTT  --  30  --   < > 46*  < > 37 35 57*  LABPROT  --  14.3  --   --  15.6*  --   --   --   --   INR  --  1.09  --   --  1.23  --   --   --   --   CREATININE 6.33*  --  5.73*  --   --   --   --   --   --   < > = values in this interval not displayed.  Estimated Creatinine Clearance: 11.6 mL/min (by C-G formula based on Cr of 5.73).   Assessment: 70 yo male with thrombocytopenia. Heparin antibody is 2.8 (levels over 2.0 have a high correlation with a positive SRA). Heparin sq given 6/12 and 6/13. 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  APTT now therapeutic on 1.5 mcg/kg/min. No bleeding noted.    Goal of Therapy:  aPTT 50-90 seconds Monitor platelets by anticoagulation protocol: Yes   Plan:  1. Continue argatroban at 1.5 mcg/kg/min (~30% increase) 2. F/u daily PTT  Sherlon Handing, PharmD, BCPS Clinical pharmacist, pager [phone] 01/26/2016 1:25 AM

## 2016-01-26 NOTE — Progress Notes (Signed)
   01/26/16 1600  Clinical Encounter Type  Visited With Patient  Visit Type Spiritual support  Referral From Nurse  Spiritual Encounters  Spiritual Needs Emotional  Stress Factors  Patient Stress Factors Exhausted;Health changes  Chaplain visit made, patient preferred not to have service at this time, advised that spiritual care support in available 24/7 upon request.

## 2016-01-26 NOTE — Progress Notes (Signed)
[facility] Progress Note  Assessment/Plan: 1. Hyperkalemia/Weakness:- initial K 6.1 Hyperkalemia resolved with HD Last K+5.3- labs today pending  2. 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 NO HEPARIN WITH HD. 3. Pyuria: WBC initially 12.5 now 7.9 Tmax 98 On rocephin, urine cx neg. MRSA neg 4. ESRD - MWF @ [place].Problems on HD not related to fluid removal. Consider possible allergy to HD Dialysis membrane. Double rinse Dialyzer prior to next HD -  5. Hypotension/volume Last Wt 71.8 - today 71.6 pre HD.Hypotensive immediately with the initiation of HD even after double rinse and minimal fluid removal- no sig volume on exam; extra saline given - titrate weight  Up- I think 71 is too low; NO standing BP were done pre HD though weight was standing- previous EDW was 75 and CXR was neg on admission; He had been dialyzing last week in [place] with post HD weights ~74 and good BPs; plan support BP on HD and check orthostatics post HD with post HD weight 6. Anemia - HGB 9.3 > 8.5   On Aranesp 25 Q Wed and IV Fe - first dose 6/14 18% sat 7.  01/24/16 ESA started yesterday. Cont Fe Load. Follow HGB 8. Metabolic bone disease - VDRA at present. Ca 9.2 P 5.8 - start binders PTH 210 9. Nutrition - Albumin 3.4. Renal/Carb mod diet. add vitamins 10. DM: per primary.  9. Permanent HD access: [facility] consulted. For perm access next week.  10.   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 BP's dropped again early w HD this am, but he didn't feel as bad and the rest of the session was more or less unremarkable.  He is getting vol loading w blood products, will see if this helps his BP's, letting his wt come up.  Also ordered echo and cortrosyn stim test for tomorrow.  Plan HD again tomorrow, slowly increase time and  BFR.   Kelly Splinter MD [facility] pager [phone]    cell [phone] 01/26/2016, 12:01 PM    Subjective:   Feels weak  Objective Filed Vitals:   01/25/16 0900 01/25/16 1709 01/25/16 2020 01/26/16 0729  BP: 111/49 105/54 118/61 99/55  Pulse: 71 62 65 60  Temp: 98 F (36.7 C) 97.5 F (36.4 C) 98.6 F (37 C) 97.6 F (36.4 C)  TempSrc: Oral Oral Oral Oral  Resp: 19  19 24   Height:      Weight:    71.6 kg (157 lb 13.6 oz)  SpO2:  99% 99%    Physical Exam General: NAD- looks tired Heart: RRR Lungs: no rales Abdomen: soft with ilesotomy Extremities: no edema Dialysis Access: right IJ Qb 250  Dialysis Orders: 4 hours EDW 75 kg 400/800 2.0K/2.25 Ca  Heparin 5000 units IV per treatment Venofer 100 mg IV X 10 (Tsat 18 01/17/16)  Additional Objective Labs: Basic Metabolic Panel:  Recent Labs Lab 01/22/16 2050  01/23/16 0227 01/23/16 1500 01/24/16 1230  NA 135  --  134*  --  136  K 5.4*  < > 5.2* 4.4 5.3*  CL 99*  --  102  --  104  CO2 27  --  23  --  22  GLUCOSE 249*  --  222*  --  97  BUN 39*  --  43*  --  31*  CREATININE 6.06*  --  6.33*  --  5.73*  CALCIUM 8.6*  --  8.3*  --  9.2  PHOS  --   --   --   --  5.8*  < > = values in this interval not displayed. Liver Function Tests:  Recent Labs Lab 01/22/16 1554 01/24/16 1230  AST 30  --   ALT 13*  --   ALKPHOS 92  --   BILITOT 1.3*  --   PROT 7.4  --   ALBUMIN 3.5 3.4*   CBC:  Recent Labs Lab 01/22/16 1554  01/23/16 0942 01/24/16 1230 01/25/16 0828 01/26/16 0738  WBC 12.5*  --  9.4 7.7  --  7.9  NEUTROABS 8.6*  --   --   --   --   --   HGB 9.9*  < > 9.3* 9.8*  --  8.5*  HCT 32.2*  < > 30.8* 32.4*  --  28.0*  MCV 86.1  --  86.5 87.1  --  86.7  PLT 48*  --  63* 67* 70* 92*  < > = values in this interval not displayed. Blood Culture    Component Value Date/Time   SDES URINE, CATHETERIZED 01/22/2016 1636   SPECREQUEST NONE 01/22/2016 1636   CULT NO GROWTH 01/22/2016 1636    REPTSTATUS 01/23/2016 FINAL 01/22/2016 1636    CBG:  Recent Labs Lab 01/24/16 2011 01/25/16 0730 01/25/16 1115 01/25/16 1621 01/25/16 2018  GLUCAP 229* 107* 301* 150* 153*  Medications: . sodium chloride    . argatroban 1.5 mcg/kg/min (01/25/16 2200)   . cefTRIAXone (ROCEPHIN)  IV  1 g Intravenous Q24H  . [START ON 01/27/2016] cosyntropin  0.25 mg Intravenous Once  . darbepoetin (ARANESP) injection - DIALYSIS  25 mcg Intravenous Q Wed-HD  . dorzolamide-timolol  1 drop Left Eye BID  . ferric gluconate (FERRLECIT/NULECIT) IV  125 mg Intravenous Q M,W,F-HD  . insulin aspart  0-15 Units Subcutaneous TID WC  . insulin glargine  25 Units Subcutaneous QHS  . sodium chloride  500 mL Intravenous Once

## 2016-01-26 NOTE — Progress Notes (Signed)
[place] for argatroban Indication: suspected HIT  Allergies  Allergen Reactions  . Heparin Other (See Comments)    Possible HIT (Heparin antibody=2.823)  . Bactrim [Sulfamethoxazole-Trimethoprim]     COULDN'T MOVE. REAL WEAK  . Metformin And Related     Real weak    Patient Measurements: Height: 5\' 8"  (172.7 cm) Weight: 162 lb 11.2 oz (73.8 kg) IBW/kg (Calculated) : 68.4  Vital Signs: Temp: 98.5 F (36.9 C) (06/16 1700) Temp Source: Oral (06/16 1700) BP: 105/47 mmHg (06/16 1700) Pulse Rate: 68 (06/16 1700)  Labs:  Recent Labs  01/24/16 1230  01/25/16 0828  01/26/16 0737 01/26/16 0738 01/26/16 1248 01/26/16 1840  HGB 9.8*  --   --   --   --  8.5*  --   --   HCT 32.4*  --   --   --   --  28.0*  --   --   PLT 67*  --  70*  --   --  90*  92*  --   --   APTT  --   < > 46*  < >  --  >200* 84* 62*  LABPROT  --   --  15.6*  --   --  23.8*  --   --   INR  --   --  1.23  --   --  2.14*  --   --   CREATININE 5.73*  --   --   --  7.15*  --   --   --   < > = values in this interval not displayed.  Estimated Creatinine Clearance: 9.3 mL/min (by C-G formula based on Cr of 7.15).   Assessment: 70 yo male with thrombocytopenia. Heparin antibody is 2.8 (levels over 2.0 have a high correlation with a positive SRA). Heparin sq given 6/12 and 6/13. Platelet count = 67  with trend up however platelet count was 48 at admit (HIT possibly  developed while on heparin with HD as outpatient). 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  Argatroban infusion continues today w/o any noted issues. Currently the drip is running at 1.5 mcg/kg/min or 6.5 ml/hr.  The last two aPTT are noted to be 57 and 84 (aPTT of > 200 appears to have been drawn incorrectly). CBC stable with improving platelets.   PM aPTT recheck 62 remains in goal range.  Goal of Therapy:  aPTT 50-90 seconds Monitor platelets by anticoagulation protocol: Yes   Plan:  1. Continue argatroban at 1.5  mcg/kg/min for now   Dea Bitting S. Alford Highland, PharmD, [place] Clinical Staff Pharmacist Pager [phone]  01/26/2016, 7:25 PM

## 2016-01-26 NOTE — Progress Notes (Signed)
PROGRESS NOTE                                                                                                                                                                                                             Patient Demographics:    Travis Carlson, is a 70 y.o. male, DOB - [birth year], [id number]  Admit date - 01/22/2016   Admitting Physician Etta Quill, DO  Outpatient Primary MD for the patient is Parke Poisson, MD  LOS - 4  Outpatient Specialists: renal  Chief Complaint  Patient presents with  . Weakness       Brief Narrative  70 year old male with a history of Barrett's esophagus, ESRD, diabetes mellitus type 2, prostate cancer s/p TURP, ileostomy presented with severe generalized weakness after HD. According to the patient, no fluid was taken off during dialysis as he was under his dry weight. The patient was recently discharged from hospitalization at [facility] from 01/05/2016 through 01/13/2016. During that hospitalization, the patient presented with abdominal pain, severe hyperkalemia and was declared new ESRD. The patient was initiated on hemodialysis initially and a right-sided IJ PermCath was placed. The patient had his first outpatient dialysis on 01/15/2016. The patient was diagnosed with viral gastroenteritis and pyelonephritis. Urinalysis on 01/06/2016 showed TNTC WBC, but urine culture was not obtained when reviewed on CareEverywhere. The patient was discharged home with cephalexin for 15 more doses. The patient states that he has proximally 7 doses left on the day of this admission. In the emergency department, the patient was noted to have serum potassium up to 6.9. He was treated with insulin, D50, bicarbonate, and calcium chloride. Repeat potassium was 5.2.       Subjective:   Drop in HD during HD, required fluid bolus. No hematuria, or gi bleed.    Assessment  & Plan :    Hyperkalemia in the setting of ESRD  -pt endorses some dietary indiscretion. Now stable with dialysis. Counseled on diet adherence. Vein mapping done. For AV fistula placement on 6/20.  Generalized weakness -combination of hypovolemia and possible urinary tract infection . 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 Required iV bolus with HD today.  Thrombocytopenia New finding. Was normal until HD started. 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 elevated fibrinigen, d-dimer and APTT. (?  Early DIC). Also has significantly eleavted PT anD APPT , could be due to argatroban. Discussed with renal to avoid heparin with dialysis. Discussed with Dr.Granfortuna. Given picture of HITT , cemented to start argatroban until platelets normalized and then start him on coumadin. ddimer and fibrinogen improved. Ordered 3 u FFP today.  Pyuria Has residual renal function and makes approximately 8-16 oz of urine daily -Empiric ceftriaxone . Has had multiple UTIs in the last few months. cx negative.     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           code Status: Full code  Family Communication  :none at bedside  Disposition Plan  :  Home once platelets normalized and switched to coumadin   Consults  :   Renal vascular  Procedures  :  HD   DVT Prophylaxis  :  SCD  Lab Results  Component Value Date   PLT 90* 01/26/2016   PLT 92* 01/26/2016    Antibiotics  :    Anti-infectives    Start     Dose/Rate Route Frequency Ordered Stop   01/23/16 0900  cefTRIAXone (ROCEPHIN) 1 g in dextrose 5 % 50 mL IVPB     1 g 100 mL/hr over 30 Minutes Intravenous Every 24 hours  01/23/16 0822          Objective:   Filed Vitals:   01/26/16 1209 01/26/16 1229 01/26/16 1330 01/26/16 1400  BP: 97/51 96/50 106/47 106/48  Pulse: 59 61 67 66  Temp: 97.9 F (36.6 C) 98.3 F (36.8 C) 98.3 F (36.8 C) 98.6 F (37 C)  TempSrc: Oral Oral Oral Oral  Resp: 16 16 16 16   Height:      Weight:      SpO2: 96% 96% 97% 98%    Wt Readings from Last 3 Encounters:  01/26/16 73.8 kg (162 lb 11.2 oz)  07/20/14 94.4 kg (208 lb 1.8 oz)     Intake/Output Summary (Last 24 hours) at 01/26/16 1500 Last data filed at 01/26/16 1400  Gross per 24 hour  Intake 229.17 ml  Output  -2051 ml  Net 2280.17 ml     Physical Exam  Gen: not in distress, Fatigued, seen during HD HEENT: no pallor, moist mucosa, supple neck Chest: clear b/l, no added sounds, HD catheter CVS: N S1&S2, no murmurs, rubs or gallop GI: soft, NT, ND, BS+ Musculoskeletal: warm, no edema, no ecchymosis or swelling CNS:  non focal    Data Review:    CBC  Recent Labs Lab 01/22/16 1554 01/22/16 1602 01/23/16 0942 01/24/16 1230 01/25/16 0828 01/26/16 0738  WBC 12.5*  --  9.4 7.7  --  7.9  HGB 9.9* 11.2* 9.3* 9.8*  --  8.5*  HCT 32.2* 33.0* 30.8* 32.4*  --  28.0*  PLT 48*  --  63* 67* 70* 90*  92*  MCV 86.1  --  86.5 87.1  --  86.7  MCH 26.5  --  26.1 26.3  --  26.3  MCHC 30.7  --  30.2 30.2  --  30.4  RDW 15.0  --  15.2 15.3  --  15.1  LYMPHSABS 2.8  --   --   --   --   --   MONOABS 0.4  --   --   --   --   --  EOSABS 0.6  --   --   --   --   --   BASOSABS 0.1  --   --   --   --   --     Chemistries   Recent Labs Lab 01/22/16 1554  01/22/16 1724 01/22/16 2050 01/23/16 0143 01/23/16 0227 01/23/16 1500 01/24/16 1230 01/26/16 0737  NA 135  < > 132* 135  --  134*  --  136 133*  K 5.9*  < > 6.9* 5.4* 4.9 5.2* 4.4 5.3* 5.5*  CL 102  < > 99* 99*  --  102  --  104 106  CO2 20*  --  24 27  --  23  --  22 17*  GLUCOSE 98  < > 96 249*  --  222*  --  97 175*  BUN 39*  < > 38* 39*  --   43*  --  31* 48*  CREATININE 5.59*  < > 5.81* 6.06*  --  6.33*  --  5.73* 7.15*  CALCIUM 9.0  --  8.8* 8.6*  --  8.3*  --  9.2 9.0  MG  --   --   --  1.8  --   --   --   --   --   AST 30  --   --   --   --   --   --   --   --   ALT 13*  --   --   --   --   --   --   --   --   ALKPHOS 92  --   --   --   --   --   --   --   --   BILITOT 1.3*  --   --   --   --   --   --   --   --   < > = values in this interval not displayed. ------------------------------------------------------------------------------------------------------------------ No results for input(s): CHOL, HDL, LDLCALC, TRIG, CHOLHDL, LDLDIRECT in the last 72 hours.  No results found for: HGBA1C ------------------------------------------------------------------------------------------------------------------  Recent Labs  01/26/16 0830  TSH 0.924   ------------------------------------------------------------------------------------------------------------------ No results for input(s): VITAMINB12, FOLATE, FERRITIN, TIBC, IRON, RETICCTPCT in the last 72 hours.  Coagulation profile  Recent Labs Lab 01/23/16 0942 01/25/16 0828 01/26/16 0738  INR 1.09 1.23 2.14*     Recent Labs  01/25/16 0828 01/26/16 0738  DDIMER 6.12* 3.19*    Cardiac Enzymes No results for input(s): CKMB, TROPONINI, MYOGLOBIN in the last 168 hours.  Invalid input(s): CK ------------------------------------------------------------------------------------------------------------------ No results found for: BNP  Inpatient Medications  Scheduled Meds: . cefTRIAXone (ROCEPHIN)  IV  1 g Intravenous Q24H  . [START ON 01/27/2016] cosyntropin  0.25 mg Intravenous Once  . darbepoetin (ARANESP) injection - DIALYSIS  25 mcg Intravenous Q Wed-HD  . dorzolamide-timolol  1 drop Left Eye BID  . ferric gluconate (FERRLECIT/NULECIT) IV  125 mg Intravenous Q M,W,F-HD  . insulin aspart  0-15 Units Subcutaneous TID WC  . insulin glargine  25 Units  Subcutaneous QHS  . multivitamin  1 tablet Oral QHS  . sevelamer carbonate  800 mg Oral TID WC  . sodium chloride  500 mL Intravenous Once   Continuous Infusions: . sodium chloride    . argatroban 1.5 mcg/kg/min (01/26/16 1305)   PRN Meds:.sodium chloride, sodium chloride, sodium chloride, sodium chloride, anticoagulant sodium citrate, lidocaine (PF), lidocaine-prilocaine, pentafluoroprop-tetrafluoroeth  Micro Results Recent Results (from the past 240  hour(s))  Urine culture     Status: None   Collection Time: 01/22/16  4:36 PM  Result Value Ref Range Status   Specimen Description URINE, CATHETERIZED  Final   Special Requests NONE  Final   Culture NO GROWTH  Final   Report Status 01/23/2016 FINAL  Final    Radiology Reports Dg Chest 2 View  01/22/2016  CLINICAL DATA:  Acute onset weakness and lightheadedness at 1:15 p.m. today while undergoing dialysis. EXAM: CHEST  2 VIEW COMPARISON:  Single-view of the chest 09/01/2014 and CT chest 07/12/2014. FINDINGS: Dialysis catheter is noted. Aortic stent graft is in place. The lungs are clear. Heart size is normal. No pneumothorax or pleural effusion. IMPRESSION: No acute disease. Electronically Signed   By: Inge Rise M.D.   On: 01/22/2016 15:11    Time Spent in minutes  25   Louellen Molder M.D on 01/26/2016 at 3:00 PM  Between 7am to 7pm - Pager - [phone]  After 7pm go to [email or web address] - password [facility]  [facility] -  Office  [phone]

## 2016-01-26 NOTE — Care Management Important Message (Signed)
Important Message  Patient Details  Name: Travis Carlson MRN: [id number] Date of Birth: 09/27/1945   Medicare Important Message Given:  Yes    Loann Quill 01/26/2016, 9:32 AM

## 2016-01-26 NOTE — Progress Notes (Signed)
  Echocardiogram 2D Echocardiogram has been performed.  Travis Carlson 01/26/2016, 2:44 PM

## 2016-01-26 NOTE — Progress Notes (Signed)
HD treatment in progress. SBP of 99 at initiation of treatment at 0729. Within 15 minutes, SBP noted at 62. NS 213ml bolus given with no improvement in BP. Rinsed blood back to patient and system placed in recirculation while MD contacted. MD called back with order to resume treatment with UF off and administer 529ml NS bolus over 30 minutes. Instructed to call MD back in 30 minutes to update. Treatment restarted at 0803 with initial SBP of 94.

## 2016-01-27 DIAGNOSIS — D6489 Other specified anemias: Secondary | ICD-10-CM

## 2016-01-27 LAB — CBC
HCT: 23.6 % — ABNORMAL LOW (ref 39.0–52.0)
Hemoglobin: 7.3 g/dL — ABNORMAL LOW (ref 13.0–17.0)
MCH: 26.7 pg (ref 26.0–34.0)
MCHC: 30.9 g/dL (ref 30.0–36.0)
MCV: 86.4 fL (ref 78.0–100.0)
PLATELETS: 74 10*3/uL — AB (ref 150–400)
RBC: 2.73 MIL/uL — AB (ref 4.22–5.81)
RDW: 15.2 % (ref 11.5–15.5)
WBC: 5.3 10*3/uL (ref 4.0–10.5)

## 2016-01-27 LAB — DIC (DISSEMINATED INTRAVASCULAR COAGULATION) PANEL
INR: 2.04 — AB (ref 0.00–1.49)
PROTHROMBIN TIME: 22.9 s — AB (ref 11.6–15.2)

## 2016-01-27 LAB — PREPARE FRESH FROZEN PLASMA
UNIT DIVISION: 0
UNIT DIVISION: 0
Unit division: 0

## 2016-01-27 LAB — COMPREHENSIVE METABOLIC PANEL
ALT: 10 U/L — AB (ref 17–63)
AST: 15 U/L (ref 15–41)
Albumin: 3 g/dL — ABNORMAL LOW (ref 3.5–5.0)
Alkaline Phosphatase: 76 U/L (ref 38–126)
Anion gap: 9 (ref 5–15)
BUN: 36 mg/dL — ABNORMAL HIGH (ref 6–20)
CALCIUM: 8.4 mg/dL — AB (ref 8.9–10.3)
CHLORIDE: 105 mmol/L (ref 101–111)
CO2: 21 mmol/L — ABNORMAL LOW (ref 22–32)
CREATININE: 5.51 mg/dL — AB (ref 0.61–1.24)
GFR, EST AFRICAN AMERICAN: 11 mL/min — AB (ref >60)
GFR, EST NON AFRICAN AMERICAN: 9 mL/min — AB (ref >60)
Glucose, Bld: 248 mg/dL — ABNORMAL HIGH (ref 65–99)
Potassium: 4.6 mmol/L (ref 3.5–5.1)
Sodium: 135 mmol/L (ref 135–145)
TOTAL PROTEIN: 6.5 g/dL (ref 6.5–8.1)

## 2016-01-27 LAB — DIC (DISSEMINATED INTRAVASCULAR COAGULATION)PANEL
D-Dimer, Quant: 2.54 ug/mL-FEU — ABNORMAL HIGH (ref 0.00–0.50)
Fibrinogen: 378 mg/dL (ref 204–475)
Platelets: 78 10*3/uL — ABNORMAL LOW (ref 150–400)
Smear Review: NONE SEEN
aPTT: 66 seconds — ABNORMAL HIGH (ref 24–37)

## 2016-01-27 LAB — FERRITIN: Ferritin: 338 ng/mL — ABNORMAL HIGH (ref 24–336)

## 2016-01-27 LAB — VITAMIN B12: VITAMIN B 12: 230 pg/mL (ref 180–914)

## 2016-01-27 LAB — RETICULOCYTES
RBC.: 2.86 MIL/uL — ABNORMAL LOW (ref 4.22–5.81)
Retic Count, Absolute: 37.2 10*3/uL (ref 19.0–186.0)
Retic Ct Pct: 1.3 % (ref 0.4–3.1)

## 2016-01-27 LAB — GLUCOSE, CAPILLARY
GLUCOSE-CAPILLARY: 269 mg/dL — AB (ref 65–99)
Glucose-Capillary: 189 mg/dL — ABNORMAL HIGH (ref 65–99)
Glucose-Capillary: 265 mg/dL — ABNORMAL HIGH (ref 65–99)

## 2016-01-27 LAB — IRON AND TIBC
Iron: 40 ug/dL — ABNORMAL LOW (ref 45–182)
SATURATION RATIOS: 15 % — AB (ref 17.9–39.5)
TIBC: 260 ug/dL (ref 250–450)
UIBC: 220 ug/dL

## 2016-01-27 LAB — LACTATE DEHYDROGENASE: LDH: 135 U/L (ref 98–192)

## 2016-01-27 LAB — PREPARE RBC (CROSSMATCH)

## 2016-01-27 LAB — FOLATE: Folate: 20 ng/mL (ref >5.9)

## 2016-01-27 LAB — SAVE SMEAR

## 2016-01-27 MED ORDER — SEVELAMER CARBONATE 800 MG PO TABS
1600.0000 mg | ORAL_TABLET | Freq: Three times a day (TID) | ORAL | Status: DC
  Administered 2016-01-27 – 2016-01-30 (×8): 1600 mg via ORAL
  Filled 2016-01-27 (×9): qty 2

## 2016-01-27 MED ORDER — ANTICOAGULANT SODIUM CITRATE 4% (200MG/5ML) IV SOLN
5.0000 mL | Freq: Once | Status: AC
  Administered 2016-01-27: 5 mL via INTRAVENOUS
  Filled 2016-01-27: qty 250

## 2016-01-27 MED ORDER — ALTEPLASE 2 MG IJ SOLR
2.0000 mg | Freq: Once | INTRAMUSCULAR | Status: DC | PRN
Start: 2016-01-27 — End: 2016-01-27

## 2016-01-27 MED ORDER — ACETAMINOPHEN 325 MG PO TABS
ORAL_TABLET | ORAL | Status: AC
  Filled 2016-01-27: qty 1

## 2016-01-27 MED ORDER — LIDOCAINE HCL (PF) 1 % IJ SOLN
5.0000 mL | INTRAMUSCULAR | Status: DC | PRN
Start: 2016-01-27 — End: 2016-01-27

## 2016-01-27 MED ORDER — SODIUM CHLORIDE 0.9 % IV SOLN: Freq: Once | INTRAVENOUS | Status: DC

## 2016-01-27 MED ORDER — DIPHENHYDRAMINE HCL 25 MG PO CAPS
25.0000 mg | ORAL_CAPSULE | Freq: Once | ORAL | Status: AC
  Administered 2016-01-27: 25 mg via ORAL
  Filled 2016-01-27: qty 1

## 2016-01-27 MED ORDER — SODIUM CHLORIDE 0.9 % IV SOLN: 100.0000 mL | INTRAVENOUS | Status: DC | PRN

## 2016-01-27 MED ORDER — LIDOCAINE-PRILOCAINE 2.5-2.5 % EX CREA
1.0000 | TOPICAL_CREAM | CUTANEOUS | Status: DC | PRN
Start: 2016-01-27 — End: 2016-01-27

## 2016-01-27 MED ORDER — PENTAFLUOROPROP-TETRAFLUOROETH EX AERO: 1.0000 "application " | INHALATION_SPRAY | CUTANEOUS | Status: DC | PRN

## 2016-01-27 MED ORDER — SODIUM CHLORIDE 0.9 % IV SOLN
100.0000 mL | INTRAVENOUS | Status: DC | PRN
Start: 2016-01-27 — End: 2016-01-27

## 2016-01-27 MED ORDER — DIPHENHYDRAMINE HCL 25 MG PO CAPS
ORAL_CAPSULE | ORAL | Status: AC
  Filled 2016-01-27: qty 1

## 2016-01-27 NOTE — Progress Notes (Signed)
Pt returned to the unit he went [place] [employer] [place] a [place]. I informed him that he can not just disconnect his medicine and leave the unit. IV team consulted to start new IV site. Arthor Captain LPN

## 2016-01-27 NOTE — Progress Notes (Addendum)
PROGRESS NOTE                                                                                                                                                                                                             Patient Demographics:    Travis Carlson, is a 70 y.o. male, DOB - March 05, 1946, AN:[id number]  Admit date - 01/22/2016   Admitting Physician Etta Quill, DO  Outpatient Primary MD for the patient is Parke Poisson, MD  LOS - 5  Outpatient Specialists: renal  Chief Complaint  Patient presents with  . Weakness       Brief Narrative  70 year old male with a history of Barrett's esophagus, ESRD, diabetes mellitus type 2, prostate cancer s/p TURP,colostomy status,  ileostomy presented with severe generalized weakness after HD. According to the patient, no fluid was taken off during dialysis as he was under his dry weight. The patient was recently discharged from hospitalization at [facility] from 01/05/2016 through 01/13/2016. During that hospitalization, the patient presented with abdominal pain, severe hyperkalemia and was declared new ESRD. The patient was initiated on hemodialysis initially and a right-sided IJ PermCath was placed. The patient had his first outpatient dialysis on 01/15/2016. The patient was diagnosed with viral gastroenteritis and pyelonephritis. Urinalysis on 01/06/2016 showed TNTC WBC, but urine culture was not obtained when reviewed on CareEverywhere. The patient was discharged home with cephalexin for 15 more doses. The patient states that he has proximally 7 doses left on the day of this admission. In the emergency department, the patient was noted to have serum potassium up to 6.9. He was treated with insulin, D50, bicarbonate, and calcium chloride. Repeat potassium was 5.2.  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 Patient reportedly walked down to collect some meal from [place] last night. Refused to keep the monitor on. Also refused placement of IV line for stim test.    Assessment  & Plan :    Thrombocytopenia New finding. Was normal until HD started. 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 Serotonin release assay pending. elevated fibrinigen, d-dimer and APTT. (?Early DIC). However this could be due to argatroban. Discussed with renal to avoid heparin with dialysis. Discussed with Dr.Granfortuna. Given picture of  HITT , cemented to start argatroban until platelets normalized and then start him on coumadin. Received 3 units FFP on 6/16. Platelets still low. Consulted Dr. Marin Olp who will see patient.  Anemia with drop in H&H Hemoglobin dropped to 7.3 (recent baseline around 10). Receiving Aranesp, IV iron on 6/14. Check LDH and reticulocyte to rule out hemolysis. Iron studies, peripheral smear and erythropoietin ordered. Check stool for occult blood. Hematology will see patient.  Hypotension with hypovolemia Mostly pronounced with dialysis requiring IV fluid boluses. Avoiding fluid removal with dialysis when blood pressure is low. Patient refused stim test.  Hyperkalemia in the setting of ESRD  -pt endorses some dietary indiscretion. Resolved with dialysis. Counseled on diet adherence. Concern for allergies to HD dialysis membrane. Vein mapping done. For AV fistula placement on 6/20.  Generalized weakness -combination of hypovolemia and possible urinary tract infection . 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   Pyuria Has residual renal function and makes approximately 8-16 oz of urine daily  Has had multiple UTIs in the last few months. Also negative. We'll discontinue antibiotics.   ESRD Renal following.  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 Also refused to contact his daughter in [place] for update.       code Status: Full code  Family Communication  :none at bedside. Patient refused to contact his daughter  Disposition Plan  :  Home once platelets normalized and switched to coumadin   Consults  :   Renal Vascular Hematology  Procedures  :  HD   DVT Prophylaxis  :  SCD  Lab Results  Component Value Date   PLT 78* 01/27/2016   PLT 74* 01/27/2016    Antibiotics  :    Anti-infectives    Start     Dose/Rate Route Frequency Ordered Stop   01/23/16 0900  cefTRIAXone (ROCEPHIN) 1 g in dextrose 5 % 50 mL IVPB     1 g 100 mL/hr over 30 Minutes Intravenous Every 24 hours 01/23/16 0822          Objective:   Filed Vitals:   01/27/16 1226 01/27/16 1238 01/27/16 1243 01/27/16 1300  BP: 62/29 95/47 105/53 113/54  Pulse: 75 68 63 57  Temp:   98 F (36.7 C)   TempSrc:   Oral   Resp:   15 16  Height:      Weight:      SpO2:   94%     Wt Readings from Last 3 Encounters:  01/27/16 72.9 kg (160 lb 11.5 oz)  07/20/14 94.4 kg (208 lb 1.8 oz)     Intake/Output Summary (Last 24 hours) at 01/27/16 1310 Last data filed at 01/27/16 1243  Gross per 24 hour  Intake 2342.5 ml  Output      0 ml  Net 2342.5 ml     Physical Exam  Gen: Appears extremely fatigued. HEENT:  moist mucosa, supple neck Chest: clear b/l, no added sounds, HD catheter CVS: N S1&S2, no murmurs, rubs or gallop GI: soft, NT, ND, BS+, colostomy with good output Musculoskeletal: warm, no edema, no ecchymosis or swelling CNS:  non focal    Data  Review:    CBC  Recent Labs Lab 01/22/16 1554 01/22/16 1602 01/23/16 0942 01/24/16 1230  01/25/16 0828 01/26/16 0738 01/27/16 0854  WBC 12.5*  --  9.4 7.7  --  7.9 5.3  HGB 9.9* 11.2* 9.3* 9.8*  --  8.5* 7.3*  HCT 32.2* 33.0* 30.8* 32.4*  --  28.0* 23.6*  PLT 48*  --  63* 67* 70* 90*  92* 78*  74*  MCV 86.1  --  86.5 87.1  --  86.7 86.4  MCH 26.5  --  26.1 26.3  --  26.3 26.7  MCHC 30.7  --  30.2 30.2  --  30.4 30.9  RDW 15.0  --  15.2 15.3  --  15.1 15.2  LYMPHSABS 2.8  --   --   --   --   --   --   MONOABS 0.4  --   --   --   --   --   --   EOSABS 0.6  --   --   --   --   --   --   BASOSABS 0.1  --   --   --   --   --   --     Chemistries   Recent Labs Lab 01/22/16 1554  01/22/16 2050  01/23/16 0227 01/23/16 1500 01/24/16 1230 01/26/16 0737 01/27/16 0854  NA 135  < > 135  --  134*  --  136 133* 135  K 5.9*  < > 5.4*  < > 5.2* 4.4 5.3* 5.5* 4.6  CL 102  < > 99*  --  102  --  104 106 105  CO2 20*  < > 27  --  23  --  22 17* 21*  GLUCOSE 98  < > 249*  --  222*  --  97 175* 248*  BUN 39*  < > 39*  --  43*  --  31* 48* 36*  CREATININE 5.59*  < > 6.06*  --  6.33*  --  5.73* 7.15* 5.51*  CALCIUM 9.0  < > 8.6*  --  8.3*  --  9.2 9.0 8.4*  MG  --   --  1.8  --   --   --   --   --   --   AST 30  --   --   --   --   --   --   --  15  ALT 13*  --   --   --   --   --   --   --  10*  ALKPHOS 92  --   --   --   --   --   --   --  76  BILITOT 1.3*  --   --   --   --   --   --   --  <0.1*  < > = values in this interval not displayed. ------------------------------------------------------------------------------------------------------------------ No results for input(s): CHOL, HDL, LDLCALC, TRIG, CHOLHDL, LDLDIRECT in the last 72 hours.  No results found for: HGBA1C ------------------------------------------------------------------------------------------------------------------  Recent Labs  01/26/16 0830  TSH 0.924   ------------------------------------------------------------------------------------------------------------------ No results for input(s):  VITAMINB12, FOLATE, FERRITIN, TIBC, IRON, RETICCTPCT in the last 72 hours.  Coagulation profile  Recent Labs Lab 01/23/16 0942 01/25/16 0828 01/26/16 0738 01/27/16 0854  INR 1.09 1.23 2.14* 2.04*     Recent Labs  01/26/16 0738 01/27/16 0854  DDIMER 3.19* 2.54*    Cardiac Enzymes No results  for input(s): CKMB, TROPONINI, MYOGLOBIN in the last 168 hours.  Invalid input(s): CK ------------------------------------------------------------------------------------------------------------------ No results found for: BNP  Inpatient Medications  Scheduled Meds: . sodium chloride   Intravenous Once  . anticoagulant sodium citrate  5 mL Intravenous Once in dialysis  . cefTRIAXone (ROCEPHIN)  IV  1 g Intravenous Q24H  . cosyntropin  0.25 mg Intravenous Once  . darbepoetin (ARANESP) injection - DIALYSIS  25 mcg Intravenous Q Wed-HD  . dorzolamide-timolol  1 drop Left Eye BID  . ferric gluconate (FERRLECIT/NULECIT) IV  125 mg Intravenous Q M,W,F-HD  . insulin aspart  0-15 Units Subcutaneous TID WC  . insulin glargine  25 Units Subcutaneous QHS  . multivitamin  1 tablet Oral QHS  . sevelamer carbonate  1,600 mg Oral TID WC  . sodium chloride  500 mL Intravenous Once   Continuous Infusions: . sodium chloride 75 mL/hr (01/26/16 1534)  . argatroban 1.5 mcg/kg/min (01/26/16 2108)   PRN Meds:.sodium chloride, sodium chloride, alteplase, anticoagulant sodium citrate  Micro Results Recent Results (from the past 240 hour(s))  Urine culture     Status: None   Collection Time: 01/22/16  4:36 PM  Result Value Ref Range Status   Specimen Description URINE, CATHETERIZED  Final   Special Requests NONE  Final   Culture NO GROWTH  Final   Report Status 01/23/2016 FINAL  Final    Radiology Reports Dg Chest 2 View  01/22/2016  CLINICAL DATA:  Acute onset weakness and lightheadedness at 1:15 p.m. today while undergoing dialysis. EXAM: CHEST  2 VIEW COMPARISON:  Single-view of the chest  09/01/2014 and CT chest 07/12/2014. FINDINGS: Dialysis catheter is noted. Aortic stent graft is in place. The lungs are clear. Heart size is normal. No pneumothorax or pleural effusion. IMPRESSION: No acute disease. Electronically Signed   By: Inge Rise M.D.   On: 01/22/2016 15:11    Time Spent in minutes 35   Louellen Molder M.D on 01/27/2016 at 1:10 PM  Between 7am to 7pm - Pager - [phone]  After 7pm go to [email or web address] - password [facility]  [facility] -  Office  [phone]

## 2016-01-27 NOTE — Progress Notes (Addendum)
[facility] Progress Note  Assessment/Plan: 1. 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 NO HEPARIN WITH HD. 3. Pyuria: WBC initially 12.5 now 7.9 Tmax 98 On rocephin, urine cx neg. MRSA neg 4. ESRD - MWF [place].Problems on HD not related to fluid removal. Consider possible allergy to HD Dialysis membrane. Double rinse Dialyzer prior to next HD - no previous documented problems like this with HD when he started HD last admission or at his outpt HD unit; short HD Saturday to determine if further reactions - keep even to start - 3hr - start with low BFR/DFR and titrate up 5. Hypotension/volume He had been dialyzing last week in [place] with post HD weights ~74 and good BPs;  post HD weight 5/15 73.8 -+ volume from saline/FFP- try to get standing weights pre HD if possible - will not pull fluid unless SBP > 120 6. Anemia - HGB 9.3 > 8.5>7.3 - needs transfusion today on HD- premed with benadryl and tylenol given lowish BP -also hemocult - not clear why dropping On Aranesp 25 Q Wed and IV Fe - first dose 6/14 18% sat Cont Fe Load. 7. Metabolic bone disease - VDRA at present. Ca 9.2 P 6.4- started binders PTH 210 8. Nutrition - Albumin 3.4. Renal/Carb mod diet. add vitamins 9. DM: per primary.  9. Permanent HD access: [facility] consulted. For perm access next week.if stable 10. 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  Dropping Hb from 11 on admission to 7.3 now.  Also platelets down, suspected HIT.  Order LDH/ haptoglobin for poss hemolysis.  Also anemia panel.  Continues to have unexplained BP drops and severe fatigue on HD, not pulling any fluid and rinsing dialyzer in case of membrane allergy.  Checking adrenal function. ECHO wnl.      Kelly Splinter MD [facility] pager [phone]    cell [phone] 01/27/2016, 12:50 PM    Subjective:   Feel weak, just wants to go home. Denies change in color of stools  Objective Filed Vitals:   01/26/16 1700 01/26/16 2319 01/27/16 0629 01/27/16 0908  BP: 105/47 115/54 114/51 114/53  Pulse: 68 64 71 68  Temp: 98.5 F (36.9 C) 99.3 F (37.4 C) 98.6 F (37 C) 98.9 F (37.2 C)  TempSrc: Oral Oral Oral Oral  Resp: 18 16 16 18   Height:      Weight:      SpO2: 95% 97% 98% 96%   Physical Exam General:frail ill appearing Heart: RRR Lungs: no rales Abdomen: soft NT Extremities: no LE edema Dialysis Access: right IJ  Dialysis Orders: 4 hours EDW 75 kg 400/800 2.0K/2.25 Ca  Heparin 5000 units IV per treatment Venofer 100 mg IV X 10 (Tsat 18 01/17/16)  Additional Objective Labs: Basic Metabolic Panel:  Recent Labs Lab 01/24/16 1230 01/26/16 0737 01/27/16 0854  NA 136 133* 135  K 5.3* 5.5* 4.6  CL 104 106 105  CO2 22 17* 21*  GLUCOSE 97 175* 248*  BUN 31* 48* 36*  CREATININE 5.73* 7.15* 5.51*  CALCIUM 9.2 9.0 8.4*  PHOS 5.8* 6.4*  --    Liver Function Tests:  Recent Labs Lab 01/22/16 1554 01/24/16 1230 01/26/16 0737 01/27/16 0854  AST 30  --   --  15  ALT 13*  --   --  10*  ALKPHOS 92  --   --  76  BILITOT 1.3*  --   --  <0.1*  PROT 7.4  --   --  6.5  ALBUMIN 3.5 3.4* 3.4* 3.0*   CBC:  Recent Labs Lab 01/22/16 1554  01/23/16 0942 01/24/16 1230 01/25/16 0828 01/26/16 0738 01/27/16 0854  WBC 12.5*  --  9.4 7.7  --  7.9 5.3  NEUTROABS 8.6*  --   --   --   --   --   --   HGB 9.9*  < > 9.3* 9.8*  --  8.5* 7.3*  HCT 32.2*  < > 30.8* 32.4*  --  28.0* 23.6*  MCV 86.1  --  86.5 87.1  --  86.7 86.4  PLT 48*  --  63* 67* 70* 90*  92* 78*  74*  < > = values in this interval not displayed. Blood Culture    Component Value Date/Time   SDES URINE, CATHETERIZED 01/22/2016 1636   SPECREQUEST NONE 01/22/2016 1636   CULT NO GROWTH  01/22/2016 1636   REPTSTATUS 01/23/2016 FINAL 01/22/2016 1636    CBG:  Recent Labs Lab 01/25/16 2018 01/26/16 1158 01/26/16 1654 01/26/16 2255 01/27/16 0745  GLUCAP 153* 118* 249* 107* 269*    No results found. Medications: . sodium chloride 75 mL/hr (01/26/16 1534)  . argatroban 1.5 mcg/kg/min (01/26/16 2108)   . cefTRIAXone (ROCEPHIN)  IV  1 g Intravenous Q24H  . cosyntropin  0.25 mg Intravenous Once  . darbepoetin (ARANESP) injection - DIALYSIS  25 mcg Intravenous Q Wed-HD  . dorzolamide-timolol  1 drop Left Eye BID  . ferric gluconate (FERRLECIT/NULECIT) IV  125 mg Intravenous Q M,W,F-HD  . insulin aspart  0-15 Units Subcutaneous TID WC  . insulin glargine  25 Units Subcutaneous QHS  . multivitamin  1 tablet Oral QHS  . sevelamer carbonate  1,600 mg Oral TID WC  . sodium chloride  500 mL Intravenous Once

## 2016-01-27 NOTE — Progress Notes (Signed)
Pt has to have 2nd IV site for Cosyntropin injection.  Not studied with continuous Argatroban.  Per pharmacy has to have separate site.  Pt refuses to have second IV site.  States veins are gone and is painful.  Paged Dr. Bonney Roussel

## 2016-01-27 NOTE — Progress Notes (Signed)
Order to cancel serum hgb lab per Dr. Jonnie Finner.

## 2016-01-27 NOTE — Progress Notes (Signed)
[facility] informed that patient left the floor he stated that he would come back but he disconnected his IV and has his regular clothes on security has has been called.Arthor Captain LPN

## 2016-01-27 NOTE — Progress Notes (Signed)
[place] for argatroban Indication: suspected HIT  Allergies  Allergen Reactions  . Heparin Other (See Comments)    Possible HIT (Heparin antibody=2.823)  . Bactrim [Sulfamethoxazole-Trimethoprim]     COULDN'T MOVE. REAL WEAK  . Metformin And Related     Real weak    Patient Measurements: Height: 5\' 8"  (172.7 cm) Weight: 162 lb 11.2 oz (73.8 kg) IBW/kg (Calculated) : 68.4  Vital Signs: Temp: 98.9 F (37.2 C) (06/17 0908) Temp Source: Oral (06/17 0908) BP: 114/53 mmHg (06/17 0908) Pulse Rate: 68 (06/17 0908)  Labs:  Recent Labs  01/24/16 1230  01/25/16 0828  01/26/16 0737 01/26/16 0738 01/26/16 1248 01/26/16 1840 01/27/16 0854  HGB 9.8*  --   --   --   --  8.5*  --   --  7.3*  HCT 32.4*  --   --   --   --  28.0*  --   --  23.6*  PLT 67*  --  70*  --   --  90*  92*  --   --  78*  74*  APTT  --   < > 46*  < >  --  >200* 84* 62* 66*  LABPROT  --   --  15.6*  --   --  23.8*  --   --  22.9*  INR  --   --  1.23  --   --  2.14*  --   --  2.04*  CREATININE 5.73*  --   --   --  7.15*  --   --   --  5.51*  < > = values in this interval not displayed.  Estimated Creatinine Clearance: 12.1 mL/min (by C-G formula based on Cr of 5.51).   Assessment: 70 yo male with thrombocytopenia. Heparin antibody is 2.8 (levels over 2.0 have a high correlation with a positive SRA). Heparin sq given 6/12 and 6/13. Platelet count = 67  with trend up however platelet count was 48 at admit (HIT possibly  developed while on heparin with HD as outpatient). 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  Argatroban infusion continues today w/o any noted issues. Currently the drip is running at 1.5 mcg/kg/min or 6.5 ml/hr.  APTT levels have been within goal (aPTT of > 200 appears to have been drawn incorrectly). CBC stable except platelets decreased today 90>74. APTT this AM 66, remains therapeutic.  Goal of Therapy:  aPTT 50-90 seconds Monitor platelets by anticoagulation  protocol: Yes   Plan:  Continue argatroban at 1.5 mcg/kg/min for now   Joya San, PharmD Clinical Pharmacy Resident Pager # [phone] 01/27/2016 11:29 AM

## 2016-01-27 NOTE — Progress Notes (Signed)
Hemodialysis- Pt refused pre medications for blood transfusion.

## 2016-01-28 DIAGNOSIS — D509 Iron deficiency anemia, unspecified: Secondary | ICD-10-CM

## 2016-01-28 DIAGNOSIS — N186 End stage renal disease: Secondary | ICD-10-CM

## 2016-01-28 DIAGNOSIS — D696 Thrombocytopenia, unspecified: Secondary | ICD-10-CM

## 2016-01-28 LAB — TYPE AND SCREEN
ABO/RH(D): A POS
ANTIBODY SCREEN: NEGATIVE
UNIT DIVISION: 0
Unit division: 0

## 2016-01-28 LAB — CBC
HEMATOCRIT: 29.7 % — AB (ref 39.0–52.0)
Hemoglobin: 9.3 g/dL — ABNORMAL LOW (ref 13.0–17.0)
MCH: 27 pg (ref 26.0–34.0)
MCHC: 31.3 g/dL (ref 30.0–36.0)
MCV: 86.3 fL (ref 78.0–100.0)
Platelets: 67 10*3/uL — ABNORMAL LOW (ref 150–400)
RBC: 3.44 MIL/uL — ABNORMAL LOW (ref 4.22–5.81)
RDW: 15.1 % (ref 11.5–15.5)
WBC: 6 10*3/uL (ref 4.0–10.5)

## 2016-01-28 LAB — HAPTOGLOBIN: HAPTOGLOBIN: 216 mg/dL — AB (ref 34–200)

## 2016-01-28 LAB — GLUCOSE, CAPILLARY
GLUCOSE-CAPILLARY: 141 mg/dL — AB (ref 65–99)
GLUCOSE-CAPILLARY: 247 mg/dL — AB (ref 65–99)
Glucose-Capillary: 205 mg/dL — ABNORMAL HIGH (ref 65–99)
Glucose-Capillary: 234 mg/dL — ABNORMAL HIGH (ref 65–99)

## 2016-01-28 LAB — APTT: APTT: 64 s — AB (ref 24–37)

## 2016-01-28 MED ORDER — DARBEPOETIN ALFA 40 MCG/0.4ML IJ SOSY
40.0000 ug | PREFILLED_SYRINGE | Freq: Once | INTRAMUSCULAR | Status: AC
  Administered 2016-01-28: 40 ug via INTRAVENOUS
  Filled 2016-01-28: qty 0.4

## 2016-01-28 MED ORDER — DARBEPOETIN ALFA 60 MCG/0.3ML IJ SOSY: 60.0000 ug | PREFILLED_SYRINGE | INTRAMUSCULAR | Status: DC

## 2016-01-28 MED ORDER — SODIUM CHLORIDE 0.9 % IV SOLN: 510.0000 mg | Freq: Once | INTRAVENOUS | Status: DC

## 2016-01-28 MED ORDER — CYANOCOBALAMIN 1000 MCG/ML IJ SOLN
1000.0000 ug | Freq: Every day | INTRAMUSCULAR | Status: DC
  Administered 2016-01-28 – 2016-01-29 (×2): 1000 ug via INTRAMUSCULAR
  Filled 2016-01-28 (×6): qty 1

## 2016-01-28 NOTE — Progress Notes (Signed)
[place] for argatroban Indication: suspected HIT  Allergies  Allergen Reactions  . Heparin Other (See Comments)    Possible HIT (Heparin antibody=2.823)  . Bactrim [Sulfamethoxazole-Trimethoprim]     COULDN'T MOVE. REAL WEAK  . Metformin And Related     Real weak    Patient Measurements: Height: 5\' 8"  (172.7 cm) Weight: 164 lb 3.9 oz (74.5 kg) IBW/kg (Calculated) : 68.4  Vital Signs: Temp: 98 F (36.7 C) (06/18 0803) Temp Source: Oral (06/18 0803) BP: 134/61 mmHg (06/18 0803) Pulse Rate: 49 (06/18 0803)  Labs:  Recent Labs  01/26/16 0737  01/26/16 0738  01/26/16 1840 01/27/16 0854 01/28/16 0400  HGB  --   < > 8.5*  --   --  7.3* 9.3*  HCT  --   --  28.0*  --   --  23.6* 29.7*  PLT  --   --  90*  92*  --   --  78*  74* 67*  APTT  --   --  >200*  < > 62* 66* 64*  LABPROT  --   --  23.8*  --   --  22.9*  --   INR  --   --  2.14*  --   --  2.04*  --   CREATININE 7.15*  --   --   --   --  5.51*  --   < > = values in this interval not displayed.  Estimated Creatinine Clearance: 12.1 mL/min (by C-G formula based on Cr of 5.51).   Assessment: 70 yo male with thrombocytopenia. Heparin antibody is 2.8 (levels over 2.0 have a high correlation with a positive SRA). Heparin sq given 6/12 and 6/13. Platelet count was 48 at admit (HIT possibly  developed while on heparin with HD as outpatient). 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  Argatroban infusion continues today w/o any noted issues. Currently the drip is running at 1.5 mcg/kg/min or 6.5 ml/hr.  APTT levels have been within goal (aPTT of > 200 appears to have been drawn incorrectly). CBC stable except platelets decreasing 90>74>67. APTT this AM 64, remains therapeutic.  Goal of Therapy:  aPTT 50-90 seconds Monitor platelets by anticoagulation protocol: Yes   Plan:  Continue argatroban at 1.5 mcg/kg/min for now   Joya San, PharmD Clinical Pharmacy Resident Pager #  [phone] 01/28/2016 8:56 AM

## 2016-01-28 NOTE — Progress Notes (Signed)
[facility] Progress Note  Assessment/Plan: 1. Hyperkalemia - resolved 2. Thrombocytopenia/  Hit positive - on argatroban by pharmacy. Serotonin assay pending. NO HEPARIN WITH HD. 3. Pyuria./ ^WBC - on Rocephin, UCx negative, WBC down 4. ESRD recent start last week to HD.  Hypotension on HD unclear cause.  BP's better now w vol / wt's up and we are rising the dialyzer in case of membrane allergy.  Cont HD MWF gradually increasing BFR and time.   5. Hypotension- much better BP's up to 123456 systolic today after PRBC's yest.   Wts up from 71 > 74kg.  ECHO was normal, TSH normal.  Refused ACTH stim test the other day.   6. Anemia - HGB 9.3 > 8.5>7.3. Got 2u prbc on Sat, Hb up >9 today. Per Onc rx with IV iron and ESA, nothing suspicious for MDS or other heme issues.   7. Metabolic bone disease - VDRA at present. Ca 9.2 P 6.4- started binders PTH 210 8. Nutrition - Albumin 3.4. Renal/Carb mod diet. add vitamins 9. DM: per primary.  9. Permanent HD access: VVS consulted. For perm access next week.if stable (Tuesday, AVG) 10.   Hx colectomy/ colostomy - 40 yrs ago, familial polyposis syndrome.  This may be contributing to his hypotension in some way if there is high output/ losses.    Kelly Splinter MD [facility] pager [phone]    cell [phone] 01/28/2016, 2:31 PM    Subjective:   Hb up to 9.3 after prbc's.    Objective Filed Vitals:   01/27/16 1955 01/28/16 0421 01/28/16 0803 01/28/16 1123  BP: 115/50 119/46 134/61   Pulse: 59 55 49   Temp: 98.5 F (36.9 C) 98.5 F (36.9 C) 98 F (36.7 C) 97.5 F (36.4 C)  TempSrc: Oral Oral Oral Oral  Resp: 19 20 18 17   Height:      Weight: 74.5 kg (164 lb 3.9 oz)     SpO2: 97% 97% 97% 99%   Physical Exam General:frail ill appearing Heart: RRR Lungs: no rales Abdomen: soft NT Extremities: no LE edema Dialysis Access: right IJ  Dialysis Orders: 4 hours EDW 75 kg 400/800 2.0K/2.25 Ca  Heparin  5000 units IV per treatment Venofer 100 mg IV X 10 (Tsat 18 01/17/16)  Additional Objective Labs: Basic Metabolic Panel:  Recent Labs Lab 01/24/16 1230 01/26/16 0737 01/27/16 0854  NA 136 133* 135  K 5.3* 5.5* 4.6  CL 104 106 105  CO2 22 17* 21*  GLUCOSE 97 175* 248*  BUN 31* 48* 36*  CREATININE 5.73* 7.15* 5.51*  CALCIUM 9.2 9.0 8.4*  PHOS 5.8* 6.4*  --    Liver Function Tests:  Recent Labs Lab 01/22/16 1554 01/24/16 1230 01/26/16 0737 01/27/16 0854  AST 30  --   --  15  ALT 13*  --   --  10*  ALKPHOS 92  --   --  76  BILITOT 1.3*  --   --  <0.1*  PROT 7.4  --   --  6.5  ALBUMIN 3.5 3.4* 3.4* 3.0*   CBC:  Recent Labs Lab 01/22/16 1554  01/23/16 0942 01/24/16 1230  01/26/16 0738 01/27/16 0854 01/28/16 0400  WBC 12.5*  --  9.4 7.7  --  7.9 5.3 6.0  NEUTROABS 8.6*  --   --   --   --   --   --   --   HGB 9.9*  < > 9.3* 9.8*  --  8.5*  7.3* 9.3*  HCT 32.2*  < > 30.8* 32.4*  --  28.0* 23.6* 29.7*  MCV 86.1  --  86.5 87.1  --  86.7 86.4 86.3  PLT 48*  --  63* 67*  < > 90*  92* 78*  74* 67*  < > = values in this interval not displayed. Blood Culture    Component Value Date/Time   SDES URINE, CATHETERIZED 01/22/2016 1636   SPECREQUEST NONE 01/22/2016 1636   CULT NO GROWTH 01/22/2016 1636   REPTSTATUS 01/23/2016 FINAL 01/22/2016 1636    CBG:  Recent Labs Lab 01/27/16 0745 01/27/16 1651 01/27/16 1953 01/28/16 0801 01/28/16 1130  GLUCAP 269* 189* 265* 205* 141*    No results found. Medications: . sodium chloride 75 mL/hr at 01/28/16 0558  . argatroban 1.5 mcg/kg/min (01/28/16 0816)   . sodium chloride   Intravenous Once  . cosyntropin  0.25 mg Intravenous Once  . cyanocobalamin  1,000 mcg Intramuscular Daily  . darbepoetin (ARANESP) injection - DIALYSIS  40 mcg Intravenous Once  . [START ON 01/31/2016] darbepoetin (ARANESP) injection - DIALYSIS  60 mcg Intravenous Q Wed-HD  . dorzolamide-timolol  1 drop Left Eye BID  . ferric gluconate  (FERRLECIT/NULECIT) IV  125 mg Intravenous Q M,W,F-HD  . ferumoxytol  510 mg Intravenous Once  . insulin aspart  0-15 Units Subcutaneous TID WC  . insulin glargine  25 Units Subcutaneous QHS  . multivitamin  1 tablet Oral QHS  . sevelamer carbonate  1,600 mg Oral TID WC  . sodium chloride  500 mL Intravenous Once

## 2016-01-28 NOTE — Consult Note (Signed)
Referral MD  Reason for Referral: Iron deficiency anemia, pernicious anemia, thrombocytopenia, HITT syndrome, end-stage renal disease, on dialysis   Chief Complaint  Patient presents with  . Weakness  : I have been quite weak and have passed out.  HPI: Travis Carlson is a nice 70 year old white male. He has long-standing diabetes. He has end-stage renal disease. He has had a colectomy because of polyps. 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 There is a history of colon cancer in the family. He had his colectomy about 40 years ago.  He generally seen at [facility]. He has a nephrologist and urologist and family doctor at [facility].  He has had problems with syncope. He was brought to come hospital. When he was admitted, his hemoglobin was 9.9. MCV was 86. His platelet count was 48,000.  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 He is on argatroban.  He still has the thrombocytopenia. Today, his blood count is 6.7.  His hemoglobin has been going down. On the 17th, his CBC showed a white cell count of 5.3. Heme of 7.3 and plantar callus 74,000. MCV was 86.  His reticulocyte count, when corrected is less than 1.  I'm not sure he is beginning any iron with dialysis.  Iron studies that were done showed a saturation of only 15%. Total iron was 40.  His vitamin B 12 was only 210.  An erythropoietin level is not yet back.  He cannot recall when his last endoscopy was. He has upper endoscopies. Has a permanent colostomy area and has not noted any melena or bright red blood through the ostomy bag.  His potassium has been quite high. This is been handled by nephrology.  We were asked to see him to try to help with the anemia and thrombocytopenia.  He used to smoke. He stopped about 10 years ago. He has about a 40-pack-year history of tobacco use.  He really does not drink. He's had no low transfusions in the past.  Overall, his performance  status is ECOG 1-2.  He is not a vegetarian.     Past Medical History  Diagnosis Date  . 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 CKD (chronic kidney disease)   . History of benign colon tumor   . DM2 (diabetes mellitus, type 2) ([place])   . ESRD (end stage renal disease) ([place])   :  Past Surgical History  Procedure Laterality Date  . Colectomy    . Thoracic aortic endovascular stent graft N/A 07/12/2014    Procedure: THORACIC AORTIC ENDOVASCULAR STENT GRAFT;  Surgeon: Serafina Mitchell, MD;  Location: [place];  Service: Vascular;  Laterality: N/A;  . Hip arthroplasty Right 07/14/2014    Procedure: ARTHROPLASTY BIPOLAR HIP;  Surgeon: Rozanna Box, MD;  Location: [place];  Service: Orthopedics;  Laterality: Right;  :   Current facility-administered medications:  .  0.9 %  sodium chloride infusion, , Intravenous, Continuous, Roney Jaffe, MD, Last Rate: 75 mL/hr at 01/28/16 0558 .  0.9 %  sodium chloride infusion, , Intravenous, Once, Alric Seton, PA-C .  argatroban 1 mg/mL infusion, 1.5 mcg/kg/min (Order-Specific), Intravenous, Continuous, Rolla Flatten, [facility], Last Rate: 6.5 mL/hr at 01/28/16 0816, 1.5 mcg/kg/min at 01/28/16 0816 .  cosyntropin (CORTROSYN) injection 0.25 mg, 0.25 mg, Intravenous, Once, Roney Jaffe, MD, 0.25 mg at 01/27/16 0730 .  cyanocobalamin ((VITAMIN B-12)) injection 1,000 mcg, 1,000 mcg, Intramuscular,  Daily, Volanda Napoleon, MD .  Darbepoetin Alfa (ARANESP) injection 25 mcg, 25 mcg, Intravenous, Q Wed-HD, Valentina Gu, NP, 25 mcg at 01/24/16 1344 .  dorzolamide-timolol (COSOPT) 22.3-6.8 MG/ML ophthalmic solution 1 drop, 1 drop, Left Eye, BID, Jared M Gardner, DO, 1 drop at 01/28/16 1000 .  ferric gluconate (NULECIT) 125 mg in sodium chloride 0.9 % 100 mL IVPB, 125 mg, Intravenous, Q M,W,F-HD, Alric Seton, PA-C, 125 mg at 01/26/16 [id number] .  ferumoxytol (FERAHEME) 510 mg in sodium chloride 0.9 % 100 mL IVPB, 510 mg, Intravenous, Once, Volanda Napoleon, MD .  insulin aspart (novoLOG) injection 0-15 Units, 0-15 Units, Subcutaneous, TID WC, Etta Quill, DO, 2 Units at 01/28/16 1202 .  insulin glargine (LANTUS) injection 25 Units, 25 Units, Subcutaneous, QHS, Orson Eva, MD, 25 Units at 01/27/16 2136 .  multivitamin (RENA-VIT) tablet 1 tablet, 1 tablet, Oral, QHS, Alric Seton, PA-C, 1 tablet at 01/27/16 2136 .  sevelamer carbonate (RENVELA) tablet 1,600 mg, 1,600 mg, Oral, TID WC, Alric Seton, PA-C, 1,600 mg at 01/28/16 1247 .  sodium chloride 0.9 % bolus 500 mL, 500 mL, Intravenous, Once, Roney Jaffe, MD:  . sodium chloride   Intravenous Once  . cosyntropin  0.25 mg Intravenous Once  . cyanocobalamin  1,000 mcg Intramuscular Daily  . darbepoetin (ARANESP) injection - DIALYSIS  25 mcg Intravenous Q Wed-HD  . dorzolamide-timolol  1 drop Left Eye BID  . ferric gluconate (FERRLECIT/NULECIT) IV  125 mg Intravenous Q M,W,F-HD  . ferumoxytol  510 mg Intravenous Once  . insulin aspart  0-15 Units Subcutaneous TID WC  . insulin glargine  25 Units Subcutaneous QHS  . multivitamin  1 tablet Oral QHS  . sevelamer carbonate  1,600 mg Oral TID WC  . sodium chloride  500 mL Intravenous Once  :  Allergies  Allergen Reactions  . Heparin Other (See Comments)    Possible HIT (Heparin antibody=2.823)  . Bactrim [Sulfamethoxazole-Trimethoprim]     COULDN'T MOVE. REAL WEAK  . Metformin And Related     Real weak  :  History reviewed. No pertinent family history.:  Social History   Social History  . Marital Status: Single    Spouse Name: N/A  . Number of Children: N/A  . Years of Education: N/A   Occupational History  . Not on file.   Social History Main Topics  . Smoking status: Former Smoker    Types: Cigarettes  . Smokeless tobacco: Not on file  . Alcohol Use: No  . Drug Use: No  . Sexual Activity: Not on file   Other Topics Concern  . Not on file   Social History Narrative  :  Pertinent items are noted in  HPI.  Exam: Patient Vitals for the past 24 hrs:  BP Temp Temp src Pulse Resp SpO2 Weight  01/28/16 1123 - 97.5 F (36.4 C) Oral - 17 99 % -  01/28/16 0803 134/61 mmHg 98 F (36.7 C) Oral (!) 49 18 97 % -  01/28/16 0421 (!) 119/46 mmHg 98.5 F (36.9 C) Oral (!) 55 20 97 % -  01/27/16 1955 (!) 115/50 mmHg 98.5 F (36.9 C) Oral (!) 59 19 97 % 164 lb 3.9 oz (74.5 kg)  01/27/16 1700 (!) 109/49 mmHg 97.9 F (36.6 C) Oral 76 16 98 % -  01/27/16 1506 124/61 mmHg 97.9 F (36.6 C) Oral (!) 58 16 95 % 162 lb 0.6 oz (73.5 kg)  01/27/16 1500 125/64 mmHg - - Marland Kitchen)  56 - - -  01/27/16 1430 131/63 mmHg - - (!) 55 - - -  01/27/16 1415 - 97.9 F (36.6 C) Oral (!) 56 16 94 % -  01/27/16 1400 126/62 mmHg 97.9 F (36.6 C) Oral (!) 59 16 - -  01/27/16 1355 120/62 mmHg - - (!) 53 - - -  01/27/16 1345 (!) 120/57 mmHg - - (!) 58 - - -  01/27/16 1330 (!) 127/55 mmHg 97.9 F (36.6 C) Oral (!) 54 16 - -  01/27/16 1315 111/61 mmHg 98.1 F (36.7 C) Oral (!) 51 15 94 % -   Well-developed well-nourished white male in no obvious distress. Head and neck exam shows no ocular or oral lesions. There are no palpable cervical or supraclavicular lymph nodes. His conjunctiva are pink. Thyroid is nonpalpable. Lungs are clear laterally. He made some slight decrease at the bases. Cardiac exam regular rate and rhythm with no murmurs, rubs or bruits. Abdomen is soft. He has a colostomy in the right lower quadrant. He has no fluid wave. There is no palpable liver or spleen tip. Back exam shows no tenderness over the spine, ribs or hips. Cavity shows no clubbing, cyanosis or edema. Skin exam shows no rashes, ecchymoses or petechia.    Recent Labs  01/27/16 0854 01/28/16 0400  WBC 5.3 6.0  HGB 7.3* 9.3*  HCT 23.6* 29.7*  PLT 78*  74* 67*    Recent Labs  01/26/16 0737 01/27/16 0854  NA 133* 135  K 5.5* 4.6  CL 106 105  CO2 17* 21*  GLUCOSE 175* 248*  BUN 48* 36*  CREATININE 7.15* 5.51*  CALCIUM 9.0 8.4*     Blood smear review:  Slightly microcytic red cells. He has some hypochromic red cells. I see no nucleated red blood cells. He has no teardrop cells. I see no schistocytes or spherocytes. I see no target cells. White cells are normal in morphology maturation. The maybe some questionable hypersegmented polys. I see no blasts. Platelets are decreased in number. Has several large platelets. There are well granulated.   Pathology: None     Assessment and Plan:  Mr. Travis Carlson is a very nice 70 year old white male. He is originally from [place]. He had a very good time talking about this.  He has anemia and thrombocytopenia. 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 He has not had an erythropoietin level done. This has been sent off.  I think a dose of Feraheme would help him out. I'm not sure if he is getting IV iron with his dialysis. I think probably needs Aranesp or Procrit depending on his erythropoietin level.  I think the thrombocytopenia is more likely related to indications. He may have some degree of immune mediated thrombocytopenia. I think the way we would know that is to do a bone marrow test on him. However, I don't think that we have to go down that road and he is a symptomatic with it.  I also suspect that he may have B-12 deficiency. I think vitamin B-12 will help her mouth.  I don't see any hematologic malignancy. I don't see any looks like myelodysplasia. I have a feeling that his iron stores are low. I think the erythropoietin is going be quite low that he probably needs ESA administration.  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 I'm sure that when he is dialysis, that he can be given some alternative anticoagulant.  We will follow along.   I spent about 45 minutes with him. He is a lot of fun to talk to. He is incredibly interesting.  Frederich Cha 1:5-7

## 2016-01-28 NOTE — Progress Notes (Addendum)
PROGRESS NOTE                                                                                                                                                                                                             Patient Demographics:    Travis Carlson, is a 70 y.o. male, DOB - December 04, 1945, [id number]  Admit date - 01/22/2016   Admitting Physician Etta Quill, DO  Outpatient Primary MD for the patient is Parke Poisson, MD  LOS - 6  Outpatient Specialists: renal  Chief Complaint  Patient presents with  . Weakness       Brief Narrative  70 year old male with a history of Barrett's esophagus, ESRD, diabetes mellitus type 2, prostate cancer s/p TURP,colostomy status,  ileostomy presented with severe generalized weakness after HD. According to the patient, no fluid was taken off during dialysis as he was under his dry weight. The patient was recently discharged from hospitalization at [facility] from 01/05/2016 through 01/13/2016. During that hospitalization, the patient presented with abdominal pain, severe hyperkalemia and was declared new ESRD. The patient was initiated on hemodialysis initially and a right-sided IJ PermCath was placed. The patient had his first outpatient dialysis on 01/15/2016. The patient was diagnosed with viral gastroenteritis and pyelonephritis. Urinalysis on 01/06/2016 showed TNTC WBC, but urine culture was not obtained when reviewed on CareEverywhere. The patient was discharged home with cephalexin for 15 more doses. The patient states that he has proximally 7 doses left on the day of this admission. In the emergency department, the patient was noted to have serum potassium up to 6.9. He was treated with insulin, D50, bicarbonate, and calcium chloride. Repeat potassium was 5.2.  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    Assessment  & Plan :    Thrombocytopenia New finding. Was normal until HD started. 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 Serotonin release assay pending. elevated fibrinigen, d-dimer and APTT. (?Early DIC). However this could be due to argatroban. Discussed with renal to avoid heparin with dialysis. Discussed with Dr.Granfortuna. Given picture of HITT , cemented to start argatroban until platelets normalized and then start him on coumadin. Received 3 units FFP on 6/16. Platelets still low.  Dr. Marin Olp consulted.  Anemia with drop in H&H Hemoglobin dropped to 7.3 (recent baseline around 10). Receiving Aranesp, IV iron on 6/14. . Iron studies suggest iron deficiency, peripheral smear and erythropoietin ordered. Likely combination of iron deficiency and anemia of chronic disease. Check stool for occult blood. Hematology ordered IV iron. Receiving Aranesp with dialysis. Low vitamin B12 level was is being replenished.  Hypotension with hypovolemia Mostly pronounced with dialysis requiring IV fluid boluses. Avoiding fluid removal with dialysis when blood pressure is low. Patient refused stim test.  Hyperkalemia in the setting of ESRD  -pt endorses some dietary indiscretion. Resolved with dialysis. Counseled on diet adherence. Concern for allergies to HD dialysis membrane. Vein mapping done. For AV fistula placement on 6/20.  Generalized weakness -combination of hypovolemia and possible urinary tract infection . 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   Pyuria Has residual renal function and makes approximately 8-16 oz of urine daily  Has had multiple UTIs in the last few months. Also negative.  Discontinued  antibiotics.   ESRD Renal following.  Diabetes mellitus type 2 -Continue reduced dose Levemir, 25 units at bedtime -NovoLog sliding scale -check Hemoglobin A1c  History  of familial polyposis syndrome -Status post colectomy with ostomy -Status post Whipple's procedure 1996    I discussed with patient about involving palliative care for goals of care discussion but he refused. Also refused to contact his daughter in [place] for update.       code Status: Full code  Family Communication  :none at bedside. Patient refused to contact his daughter  Disposition Plan  :  Home once platelets normalized and switched to coumadin   Consults  :   Renal Vascular Hematology  Procedures  :  HD   DVT Prophylaxis  :  SCD  Lab Results  Component Value Date   PLT 67* 01/28/2016    Antibiotics  :    Anti-infectives    Start     Dose/Rate Route Frequency Ordered Stop   01/23/16 0900  cefTRIAXone (ROCEPHIN) 1 g in dextrose 5 % 50 mL IVPB  Status:  Discontinued     1 g 100 mL/hr over 30 Minutes Intravenous Every 24 hours 01/23/16 0822 01/28/16 0930        Objective:   Filed Vitals:   01/27/16 1955 01/28/16 0421 01/28/16 0803 01/28/16 1123  BP: 115/50 119/46 134/61   Pulse: 59 55 49   Temp: 98.5 F (36.9 C) 98.5 F (36.9 C) 98 F (36.7 C) 97.5 F (36.4 C)  TempSrc: Oral Oral Oral Oral  Resp: 19 20 18 17   Height:      Weight: 74.5 kg (164 lb 3.9 oz)     SpO2: 97% 97% 97% 99%    Wt Readings from Last 3 Encounters:  01/27/16 74.5 kg (164 lb 3.9 oz)  07/20/14 94.4 kg (208 lb 1.8 oz)     Intake/Output Summary (Last 24 hours) at 01/28/16 1359 Last data filed at 01/28/16 1046  Gross per 24 hour  Intake 2614.46 ml  Output    -27 ml  Net 2641.46 ml     Physical Exam  Gen:  fatigued. HEENT:Pallor present  moist mucosa, supple neck Chest: clear b/l, no added sounds, HD catheter CVS: N S1&S2, no murmurs, rubs or gallop GI: soft, NT, ND, BS+, colostomy with good output Musculoskeletal: warm, no edema, no ecchymosis or swelling CNS:  non focal    Data Review:  CBC  Recent Labs Lab 01/22/16 1554  01/23/16 0942  01/24/16 1230 01/25/16 0828 01/26/16 0738 01/27/16 0854 01/28/16 0400  WBC 12.5*  --  9.4 7.7  --  7.9 5.3 6.0  HGB 9.9*  < > 9.3* 9.8*  --  8.5* 7.3* 9.3*  HCT 32.2*  < > 30.8* 32.4*  --  28.0* 23.6* 29.7*  PLT 48*  --  63* 67* 70* 90*  92* 78*  74* 67*  MCV 86.1  --  86.5 87.1  --  86.7 86.4 86.3  MCH 26.5  --  26.1 26.3  --  26.3 26.7 27.0  MCHC 30.7  --  30.2 30.2  --  30.4 30.9 31.3  RDW 15.0  --  15.2 15.3  --  15.1 15.2 15.1  LYMPHSABS 2.8  --   --   --   --   --   --   --   MONOABS 0.4  --   --   --   --   --   --   --   EOSABS 0.6  --   --   --   --   --   --   --   BASOSABS 0.1  --   --   --   --   --   --   --   < > = values in this interval not displayed.  Chemistries   Recent Labs Lab 01/22/16 1554  01/22/16 2050  01/23/16 0227 01/23/16 1500 01/24/16 1230 01/26/16 0737 01/27/16 0854  NA 135  < > 135  --  134*  --  136 133* 135  K 5.9*  < > 5.4*  < > 5.2* 4.4 5.3* 5.5* 4.6  CL 102  < > 99*  --  102  --  104 106 105  CO2 20*  < > 27  --  23  --  22 17* 21*  GLUCOSE 98  < > 249*  --  222*  --  97 175* 248*  BUN 39*  < > 39*  --  43*  --  31* 48* 36*  CREATININE 5.59*  < > 6.06*  --  6.33*  --  5.73* 7.15* 5.51*  CALCIUM 9.0  < > 8.6*  --  8.3*  --  9.2 9.0 8.4*  MG  --   --  1.8  --   --   --   --   --   --   AST 30  --   --   --   --   --   --   --  15  ALT 13*  --   --   --   --   --   --   --  10*  ALKPHOS 92  --   --   --   --   --   --   --  76  BILITOT 1.3*  --   --   --   --   --   --   --  <0.1*  < > = values in this interval not displayed. ------------------------------------------------------------------------------------------------------------------ No results for input(s): CHOL, HDL, LDLCALC, TRIG, CHOLHDL, LDLDIRECT in the last 72 hours.  No results found for: HGBA1C ------------------------------------------------------------------------------------------------------------------  Recent Labs  01/26/16 0830  TSH 0.924    ------------------------------------------------------------------------------------------------------------------  Recent Labs  01/27/16 1313 01/27/16 1314  VITAMINB12 230  --   FOLATE  --  20.0  FERRITIN 338*  --   TIBC 260  --  IRON 40*  --   RETICCTPCT 1.3  --     Coagulation profile  Recent Labs Lab 01/23/16 0942 01/25/16 0828 01/26/16 0738 01/27/16 0854  INR 1.09 1.23 2.14* 2.04*     Recent Labs  01/26/16 0738 01/27/16 0854  DDIMER 3.19* 2.54*    Cardiac Enzymes No results for input(s): CKMB, TROPONINI, MYOGLOBIN in the last 168 hours.  Invalid input(s): CK ------------------------------------------------------------------------------------------------------------------ No results found for: BNP  Inpatient Medications  Scheduled Meds: . sodium chloride   Intravenous Once  . cosyntropin  0.25 mg Intravenous Once  . cyanocobalamin  1,000 mcg Intramuscular Daily  . darbepoetin (ARANESP) injection - DIALYSIS  25 mcg Intravenous Q Wed-HD  . dorzolamide-timolol  1 drop Left Eye BID  . ferric gluconate (FERRLECIT/NULECIT) IV  125 mg Intravenous Q M,W,F-HD  . ferumoxytol  510 mg Intravenous Once  . insulin aspart  0-15 Units Subcutaneous TID WC  . insulin glargine  25 Units Subcutaneous QHS  . multivitamin  1 tablet Oral QHS  . sevelamer carbonate  1,600 mg Oral TID WC  . sodium chloride  500 mL Intravenous Once   Continuous Infusions: . sodium chloride 75 mL/hr at 01/28/16 0558  . argatroban 1.5 mcg/kg/min (01/28/16 0816)   PRN Meds:.  Micro Results Recent Results (from the past 240 hour(s))  Urine culture     Status: None   Collection Time: 01/22/16  4:36 PM  Result Value Ref Range Status   Specimen Description URINE, CATHETERIZED  Final   Special Requests NONE  Final   Culture NO GROWTH  Final   Report Status 01/23/2016 FINAL  Final    Radiology Reports Dg Chest 2 View  01/22/2016  CLINICAL DATA:  Acute onset weakness and  lightheadedness at 1:15 p.m. today while undergoing dialysis. EXAM: CHEST  2 VIEW COMPARISON:  Single-view of the chest 09/01/2014 and CT chest 07/12/2014. FINDINGS: Dialysis catheter is noted. Aortic stent graft is in place. The lungs are clear. Heart size is normal. No pneumothorax or pleural effusion. IMPRESSION: No acute disease. Electronically Signed   By: Inge Rise M.D.   On: 01/22/2016 15:11    Time Spent in minutes 25   Louellen Molder M.D on 01/28/2016 at 1:59 PM  Between 7am to 7pm - Pager - [phone]  After 7pm go to [email or web address] - password [email or web address]  [facility] -  Office  [phone]

## 2016-01-29 LAB — APTT
aPTT: 26 seconds (ref 24–37)
aPTT: 57 seconds — ABNORMAL HIGH (ref 24–37)

## 2016-01-29 LAB — GLUCOSE, CAPILLARY
GLUCOSE-CAPILLARY: 146 mg/dL — AB (ref 65–99)
GLUCOSE-CAPILLARY: 135 mg/dL — AB (ref 65–99)
GLUCOSE-CAPILLARY: 64 mg/dL — AB (ref 65–99)
GLUCOSE-CAPILLARY: 390 mg/dL — AB (ref 65–99)
Glucose-Capillary: 221 mg/dL — ABNORMAL HIGH (ref 65–99)

## 2016-01-29 LAB — CBC
HEMATOCRIT: 30.6 % — AB (ref 39.0–52.0)
Hemoglobin: 9.5 g/dL — ABNORMAL LOW (ref 13.0–17.0)
MCH: 27.1 pg (ref 26.0–34.0)
MCHC: 31 g/dL (ref 30.0–36.0)
MCV: 87.2 fL (ref 78.0–100.0)
Platelets: 100 10*3/uL — ABNORMAL LOW (ref 150–400)
RBC: 3.51 MIL/uL — AB (ref 4.22–5.81)
RDW: 15 % (ref 11.5–15.5)
WBC: 6.8 10*3/uL (ref 4.0–10.5)

## 2016-01-29 LAB — POCT I-STAT, CHEM 8
BUN: 29 mg/dL — AB (ref 6–20)
CALCIUM ION: 1.3 mmol/L (ref 1.13–1.30)
CHLORIDE: 108 mmol/L (ref 101–111)
CREATININE: 4.3 mg/dL — AB (ref 0.61–1.24)
Glucose, Bld: 207 mg/dL — ABNORMAL HIGH (ref 65–99)
HCT: 28 % — ABNORMAL LOW (ref 39.0–52.0)
Hemoglobin: 9.5 g/dL — ABNORMAL LOW (ref 13.0–17.0)
Potassium: 4.4 mmol/L (ref 3.5–5.1)
Sodium: 142 mmol/L (ref 135–145)
TCO2: 21 mmol/L (ref 0–100)

## 2016-01-29 LAB — SEROTONIN RELEASE ASSAY (SRA)
SRA .2 IU/mL UFH Ser-aCnc: 9 % (ref 0–20)
SRA 100IU/mL UFH Ser-aCnc: 1 % (ref 0–20)

## 2016-01-29 LAB — ERYTHROPOIETIN: Erythropoietin: 6 m[IU]/mL (ref 2.6–18.5)

## 2016-01-29 MED ORDER — ALTEPLASE 2 MG IJ SOLR: 2.0000 mg | Freq: Once | INTRAMUSCULAR | Status: DC | PRN

## 2016-01-29 MED ORDER — ACETAMINOPHEN 325 MG PO TABS
ORAL_TABLET | ORAL | Status: AC
  Administered 2016-01-29: 325 mg
  Filled 2016-01-29: qty 2

## 2016-01-29 MED ORDER — DIPHENHYDRAMINE HCL 25 MG PO CAPS
25.0000 mg | ORAL_CAPSULE | Freq: Once | ORAL | Status: AC
  Administered 2016-01-29: 25 mg via ORAL
  Filled 2016-01-29: qty 1

## 2016-01-29 MED ORDER — SODIUM CHLORIDE 0.9 % IV SOLN: 100.0000 mL | INTRAVENOUS | Status: DC | PRN

## 2016-01-29 MED ORDER — LIDOCAINE-PRILOCAINE 2.5-2.5 % EX CREA: 1.0000 "application " | TOPICAL_CREAM | CUTANEOUS | Status: DC | PRN

## 2016-01-29 MED ORDER — DEXTROSE 5 % IV SOLN
1.5000 g | INTRAVENOUS | Status: AC
  Administered 2016-01-30: 1.5 g via INTRAVENOUS
  Filled 2016-01-29 (×2): qty 1.5

## 2016-01-29 MED ORDER — ANTICOAGULANT SODIUM CITRATE 4% (200MG/5ML) IV SOLN
5.0000 mL | Freq: Once | Status: AC
  Administered 2016-01-31: 5 mL via INTRAVENOUS
  Filled 2016-01-29 (×3): qty 250

## 2016-01-29 MED ORDER — ACETAMINOPHEN 325 MG PO TABS
650.0000 mg | ORAL_TABLET | Freq: Four times a day (QID) | ORAL | Status: DC | PRN
  Administered 2016-01-29: 650 mg via ORAL
  Filled 2016-01-29: qty 2

## 2016-01-29 MED ORDER — PENTAFLUOROPROP-TETRAFLUOROETH EX AERO: 1.0000 "application " | INHALATION_SPRAY | CUTANEOUS | Status: DC | PRN

## 2016-01-29 MED ORDER — LIDOCAINE HCL (PF) 1 % IJ SOLN: 5.0000 mL | INTRAMUSCULAR | Status: DC | PRN

## 2016-01-29 NOTE — Care Management Important Message (Signed)
Important Message  Patient Details  Name: Travis Carlson MRN: [id number] Date of Birth: 06/30/1946   Medicare Important Message Given:  Yes    Loann Quill 01/29/2016, 10:28 AM

## 2016-01-29 NOTE — Procedures (Signed)
I have personally attended this patient's dialysis session.   2K bath Access is TDC No heparin d/t HIT + (Blocking catheter w/sodium citrate) Rinsing dialyzer Keeping volume even Hope will maintain BP Pre HD standing weight 73.2 kg.  Jamal Maes, MD [place] Pager 01/29/2016, 1:27 PM

## 2016-01-29 NOTE — Progress Notes (Signed)
Inpatient Diabetes Program Recommendations  AACE/ADA: New Consensus Statement on Inpatient Glycemic Control (2015)  Target Ranges:  Prepandial:   less than 140 mg/dL      Peak postprandial:   less than 180 mg/dL (1-2 hours)      Critically ill patients:  140 - 180 mg/dL   Results for [name], Travis Carlson (MRN [id number]) as of 01/29/2016 13:42  Ref. Range 01/28/2016 08:01 01/28/2016 11:30 01/28/2016 16:53 01/28/2016 20:53 01/29/2016 07:48 01/29/2016 11:40  Glucose-Capillary Latest Ref Range: 65-99 mg/dL 205 (H) 141 (H) 234 (H) 247 (H) 146 (H) 221 (H)   Inpatient Diabetes Program Recommendations: Please consider adding Novolog 3 units TID with meals for elevated postprandial cbgs.  Thank you, Nani Gasser. Elianis Fischbach, RN, MSN, CDE Inpatient Glycemic Control Team Team Pager [phone] (8am-5pm) 01/29/2016 1:43 PM

## 2016-01-29 NOTE — Progress Notes (Signed)
ANTICOAGULATION CONSULT NOTE - Follow Up Consult  Pharmacy Consult for Argatroban Indication: suspected HIT  Allergies  Allergen Reactions  . Bactrim [Sulfamethoxazole-Trimethoprim] Other (See Comments)    States muscle weakness for 2 days - unable to stand "it like to have killed me"   . 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 Lisinopril Other (See Comments)    Weakness  renal function abnormalities  . Metformin Anaphylaxis    GI Upset (intolerance) "it like to have killed me I couldn't get out of my chair for three days"    Patient Measurements: Height: 5\' 8"  (172.7 cm) Weight: 164 lb 3.9 oz (74.5 kg) IBW/kg (Calculated) : 68.4 Argatroban Dosing Weight: 74.5 kg  Vital Signs: Temp: 97.8 F (36.6 C) (06/19 0918) Temp Source: Oral (06/19 0918) BP: 121/66 mmHg (06/19 0918) Pulse Rate: 52 (06/19 0918)  Labs:  Recent Labs  01/27/16 0854 01/28/16 0400 01/29/16 0526 01/29/16 1052  HGB 7.3* 9.3* 9.5*  --   HCT 23.6* 29.7* 30.6*  --   PLT 78*  74* 67* 100*  --   APTT 66* 64* 26 57*  LABPROT 22.9*  --   --   --   INR 2.04*  --   --   --   CREATININE 5.51*  --   --   --     Estimated Creatinine Clearance: 12.1 mL/min (by C-G formula based on Cr of 5.51).  Assessment:   aPTT only 26 seconds this morning on Argatroban at 1.5 mcg/kg/min.  No known infusion problems, but aPTT had been therapeutic on this rate for a few days.  Repeated aPTT = 57 seconds. At goal.  SRA reported negative this morning. Platelet count up to 100K.   Heme/Onc consulted 01/28/16.  Goal of Therapy:  aPTT 50-90 seconds Monitor platelets by anticoagulation protocol: Yes   Plan:   Continue Argatroban at 1.5 mcg/kg/min.  Daily aPTT and CBC.  Will follow up plans.  Arty Baumgartner, [place] Pager: [phone] 01/29/2016,11:59 AM

## 2016-01-29 NOTE — Progress Notes (Signed)
[name] Subjective:  No cos  For hd today   Objective Vital signs in last 24 hours: Filed Vitals:   01/28/16 1123 01/28/16 1654 01/28/16 2000 01/29/16 0421  BP:  134/55  132/75  Pulse:  50  60  Temp: 97.5 F (36.4 C) 98.4 F (36.9 C) 98.1 F (36.7 C) 97.5 F (36.4 C)  TempSrc: Oral Oral Oral Oral  Resp: 17 16 18 18   Height:      Weight:      SpO2: 99% 98% 98% 97%   Weight change:   Physical Exam: General:Thin WM NAD / Ox3/pleasant  Heart: RRR no rub, mur  Lungs: CTA bilat Abdomen: soft NT/ ND / Colostomy  Bag present brown stool  Extremities: no LE edema Dialysis Access: right IJ perm cath    Labs: Basic Metabolic Panel:  Recent Labs Lab 01/24/16 1230 01/26/16 0737 01/27/16 0854  NA 136 133* 135  K 5.3* 5.5* 4.6  CL 104 106 105  CO2 22 17* 21*  GLUCOSE 97 175* 248*  BUN 31* 48* 36*  CREATININE 5.73* 7.15* 5.51*  CALCIUM 9.2 9.0 8.4*  PHOS 5.8* 6.4*  --    Liver Function Tests:  Recent Labs Lab 01/22/16 1554 01/24/16 1230 01/26/16 0737 01/27/16 0854  AST 30  --   --  15  ALT 13*  --   --  10*  ALKPHOS 92  --   --  76  BILITOT 1.3*  --   --  <0.1*  PROT 7.4  --   --  6.5  ALBUMIN 3.5 3.4* 3.4* 3.0*     Recent Labs Lab 01/22/16 1554  01/24/16 1230  01/26/16 0738 01/27/16 0854 01/28/16 0400 01/29/16 0526  WBC 12.5*  < > 7.7  --  7.9 5.3 6.0 6.8  NEUTROABS 8.6*  --   --   --   --   --   --   --   HGB 9.9*  < > 9.8*  --  8.5* 7.3* 9.3* 9.5*  HCT 32.2*  < > 32.4*  --  28.0* 23.6* 29.7* 30.6*  MCV 86.1  < > 87.1  --  86.7 86.4 86.3 87.2  PLT 48*  < > 67*  < > 90*  92* 78*  74* 67* 100*  < > = values in this interval not displayed.   Recent Labs Lab 01/28/16 0801 01/28/16 1130 01/28/16 1653 01/28/16 2053 01/29/16 0748  GLUCAP 205* 141* 234* 247* 146*    Medications: . sodium chloride 75 mL/hr at 01/28/16 2322  . argatroban 1.5 mcg/kg/min (01/29/16 [id number])   . sodium chloride   Intravenous Once  . cosyntropin  0.25 mg  Intravenous Once  . cyanocobalamin  1,000 mcg Intramuscular Daily  . [START ON 02/05/2016] darbepoetin (ARANESP) injection - DIALYSIS  60 mcg Intravenous Q Mon-HD  . dorzolamide-timolol  1 drop Left Eye BID  . ferric gluconate (FERRLECIT/NULECIT) IV  125 mg Intravenous Q M,W,F-HD  . insulin aspart  0-15 Units Subcutaneous TID WC  . insulin glargine  25 Units Subcutaneous QHS  . multivitamin  1 tablet Oral QHS  . sevelamer carbonate  1,600 mg Oral TID WC  . sodium chloride  500 mL Intravenous Once   OP Dialysis Orders: [facility] MWF, 4 hours EDW 75 kg 400/800 2.0K/2.25 Ca  Heparin 5000 units IV per treatment Venofer 100 mg IV X 10 (Tsat 18 01/17/16) never started as op sec to admit to [facility]  Problem/Plan:  1. 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 NO HEPARIN WITH HD. 2. Pyuria./ ^WBC -  Now off Rocephin, UCx negative, WBC down 3. ESRD recent start last week to HD.MWF schedule [place] / Hypotension on HD  prob.sec to high output with colostomy and vol removal attempt on hd . BP's improved  now with = vol / wt's up / rinsing the dialyzer in case of membrane allergy. Cont HD MWF gradually increasing BFR and time. 4. Hypotension-  BP's up to [id number] systolic today  Wts up from 71 > 74kg.( NEVER HAD Established EDW as just started HD recently) ECHO was normal, TSH normal. Refused ACTH stim test the other day. 5. Anemia - HGB 9.3 > 8.5>7.3.> 9.3>9.5 Got 2u prbc on Sat, Per Onc rx with IV iron and ESA, 40  Mcg Aranesp given on 18th and next weekly mon hd / IV fe load with hd  6. Metabolic bone disease - VDRA at present. Ca 9.2 P 6.4- started binders PTH 210 no vit d  7. Nutrition - Albumin 3.4.>3.0 ate all brk this am / Renal/Carb mod diet/.renal  vitamin 8. DM: per primary.  9. Permanent HD access: [facility] consulted. For perm access  (Tuesday, AVG) 10. 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   Ernest Haber, PA-C [place] [phone] 01/29/2016,9:13 AM  LOS: 7 days    I have seen and examined this patient and agree with plan and assessment in the above note. Pt new to HD admitted with weakness post 1st outpt HD treatment. Was felt to be vol depleted (from ostomy losses - no vol off with that HD), noted to have hyperkalemia. Have been allowing volume to ease up, normal ECHO, refused ACTH stim test. EDW to be determined. 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 For HD today.   Jamal Maes, MD [facility] [phone] Pager 01/29/2016, 1:24 PM

## 2016-01-29 NOTE — Progress Notes (Signed)
PROGRESS NOTE                                                                                                                                                                                                             Patient Demographics:    Travis Carlson, is a 70 y.o. male, DOB - 11-Jun-1946, [id number]  Admit date - 01/22/2016   Admitting Physician Etta Quill, DO  Outpatient Primary MD for the patient is Parke Poisson, MD  LOS - 7  Outpatient Specialists: renal  Chief Complaint  Patient presents with  . Weakness       Brief Narrative  70 year old male with a history of Barrett's esophagus, ESRD, diabetes mellitus type 2, prostate cancer s/p TURP,colostomy status,  ileostomy presented with severe generalized weakness after HD. According to the patient, no fluid was taken off during dialysis as he was under his dry weight. The patient was recently discharged from hospitalization at [facility] from 01/05/2016 through 01/13/2016. During that hospitalization, the patient presented with abdominal pain, severe hyperkalemia and was declared new ESRD. The patient was initiated on hemodialysis initially and a right-sided IJ PermCath was placed. The patient had his first outpatient dialysis on 01/15/2016. The patient was diagnosed with viral gastroenteritis and pyelonephritis. Urinalysis on 01/06/2016 showed TNTC WBC, but urine culture was not obtained when reviewed on CareEverywhere. The patient was discharged home with cephalexin for 15 more doses. The patient states that he has proximally 7 doses left on the day of this admission. In the emergency department, the patient was noted to have serum potassium up to 6.9. He was treated with insulin, D50, bicarbonate, and calcium chloride. Repeat potassium was 5.2.  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    Assessment  & Plan :    Thrombocytopenia New finding. Was normal until HD started. 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 Serotonin release assay pending. elevated fibrinigen, d-dimer and APTT , likely  due to argatroban. Discussed with renal to avoid heparin with dialysis. Discussed with Dr.Granfortuna. Given picture of HITT , recommended  to start argatroban until platelets normalized and then start him on coumadin. Received 3 units FFP on 6/16. Platelets slowly improving. Dr. Marin Olp consulted.  Anemia with  drop in H&H - Receiving Aranesp with HD. IV iron on 6/14, 6/18 -ron studies suggest iron deficiency, peripheral smear and erythropoietin ordered. Likely combination of iron deficiency and anemia of chronic disease. Check stool for occult blood.  Low vitamin B12 level being replenished.  Hypotension with hypovolemia Mostly pronounced with dialysis requiring IV fluid boluses. Avoiding fluid removal with dialysis when blood pressure is low. Patient refused stim test.  Hyperkalemia in the setting of ESRD  -pt endorses some dietary indiscretion. Resolved with dialysis. Counseled on diet adherence. Concern for allergies to HD dialysis membrane. Vein mapping done. For AV fistula placement on 6/20.  Generalized weakness -combination of hypovolemia and possible urinary tract infection . Also has HITT -Gets extremely weak after dialysis with drops in blood pressure.   Pyuria Has residual renal function and makes approximately 8-16 oz of urine daily  Has had multiple UTIs in the last few months. Also negative.  Discontinued  antibiotics.   ESRD Renal following.  Diabetes mellitus type 2 -Continue reduced dose Levemir, 25 units at bedtime -NovoLog sliding scale -check Hemoglobin A1c  History of familial polyposis syndrome -Status post colectomy with ostomy -Status post Whipple's procedure 1996    I discussed with  patient about involving palliative care for goals of care discussion but he refused. Also refused to contact his daughter in [place] for update.       code Status: Full code  Family Communication  :none at bedside. Patient refused to contact his daughter  Disposition Plan  :  Home once platelets normalized and switched to coumadin   Consults  :   Renal Vascular Hematology  Procedures  :  HD   DVT Prophylaxis  :  SCD  Lab Results  Component Value Date   PLT 100* 01/29/2016    Antibiotics  :    Anti-infectives    Start     Dose/Rate Route Frequency Ordered Stop   01/23/16 0900  cefTRIAXone (ROCEPHIN) 1 g in dextrose 5 % 50 mL IVPB  Status:  Discontinued     1 g 100 mL/hr over 30 Minutes Intravenous Every 24 hours 01/23/16 0822 01/28/16 0930        Objective:   Filed Vitals:   01/29/16 1313 01/29/16 1320 01/29/16 1330 01/29/16 1400  BP: 134/56 130/61 107/50 124/54  Pulse: 49 49 61 58  Temp:      TempSrc: Oral     Resp: 16     Height:      Weight: 73.2 kg (161 lb 6 oz)     SpO2:        Wt Readings from Last 3 Encounters:  01/29/16 73.2 kg (161 lb 6 oz)  07/20/14 94.4 kg (208 lb 1.8 oz)     Intake/Output Summary (Last 24 hours) at 01/29/16 1443 Last data filed at 01/29/16 0900  Gross per 24 hour  Intake 1782.25 ml  Output      0 ml  Net 1782.25 ml     Physical Exam  Gen:  Appears much comfortable and pleasant today HEENT:Pallor +  moist mucosa, supple neck Chest: clear b/l, no added sounds, HD catheter CVS: N S1&S2, no murmurs, GI: soft, NT, ND, BS+, colostomy with good output Musculoskeletal: warm, no edema,      Data Review:    CBC  Recent Labs Lab 01/22/16 1554  01/24/16 1230 01/25/16 0828 01/26/16 0738 01/27/16 0854 01/28/16 0400 01/29/16 0526 01/29/16 1338  WBC 12.5*  < > 7.7  --  7.9 5.3 6.0 6.8  --   HGB 9.9*  < > 9.8*  --  8.5* 7.3* 9.3* 9.5* 9.5*  HCT 32.2*  < > 32.4*  --  28.0* 23.6* 29.7* 30.6* 28.0*  PLT  48*  < > 67* 70* 90*  92* 78*  74* 67* 100*  --   MCV 86.1  < > 87.1  --  86.7 86.4 86.3 87.2  --   MCH 26.5  < > 26.3  --  26.3 26.7 27.0 27.1  --   MCHC 30.7  < > 30.2  --  30.4 30.9 31.3 31.0  --   RDW 15.0  < > 15.3  --  15.1 15.2 15.1 15.0  --   LYMPHSABS 2.8  --   --   --   --   --   --   --   --   MONOABS 0.4  --   --   --   --   --   --   --   --   EOSABS 0.6  --   --   --   --   --   --   --   --   BASOSABS 0.1  --   --   --   --   --   --   --   --   < > = values in this interval not displayed.  Chemistries   Recent Labs Lab 01/22/16 1554  01/22/16 2050  01/23/16 0227 01/23/16 1500 01/24/16 1230 01/26/16 0737 01/27/16 0854 01/29/16 1338  NA 135  < > 135  --  134*  --  136 133* 135 142  K 5.9*  < > 5.4*  < > 5.2* 4.4 5.3* 5.5* 4.6 4.4  CL 102  < > 99*  --  102  --  104 106 105 108  CO2 20*  < > 27  --  23  --  22 17* 21*  --   GLUCOSE 98  < > 249*  --  222*  --  97 175* 248* 207*  BUN 39*  < > 39*  --  43*  --  31* 48* 36* 29*  CREATININE 5.59*  < > 6.06*  --  6.33*  --  5.73* 7.15* 5.51* 4.30*  CALCIUM 9.0  < > 8.6*  --  8.3*  --  9.2 9.0 8.4*  --   MG  --   --  1.8  --   --   --   --   --   --   --   AST 30  --   --   --   --   --   --   --  15  --   ALT 13*  --   --   --   --   --   --   --  10*  --   ALKPHOS 92  --   --   --   --   --   --   --  76  --   BILITOT 1.3*  --   --   --   --   --   --   --  <0.1*  --   < > = values in this interval not displayed. ------------------------------------------------------------------------------------------------------------------ No results for input(s): CHOL, HDL, LDLCALC, TRIG, CHOLHDL, LDLDIRECT in the last 72 hours.  No results found for: HGBA1C ------------------------------------------------------------------------------------------------------------------ No results for input(s): TSH, T4TOTAL, T3FREE, THYROIDAB in the last  72 hours.  Invalid input(s):  FREET3 ------------------------------------------------------------------------------------------------------------------  Recent Labs  01/27/16 1313 01/27/16 1314  VITAMINB12 230  --   FOLATE  --  20.0  FERRITIN 338*  --   TIBC 260  --   IRON 40*  --   RETICCTPCT 1.3  --     Coagulation profile  Recent Labs Lab 01/23/16 0942 01/25/16 0828 01/26/16 0738 01/27/16 0854  INR 1.09 1.23 2.14* 2.04*     Recent Labs  01/27/16 0854  DDIMER 2.54*    Cardiac Enzymes No results for input(s): CKMB, TROPONINI, MYOGLOBIN in the last 168 hours.  Invalid input(s): CK ------------------------------------------------------------------------------------------------------------------ No results found for: BNP  Inpatient Medications  Scheduled Meds: . sodium chloride   Intravenous Once  . cyanocobalamin  1,000 mcg Intramuscular Daily  . [START ON 02/05/2016] darbepoetin (ARANESP) injection - DIALYSIS  60 mcg Intravenous Q Mon-HD  . dorzolamide-timolol  1 drop Left Eye BID  . ferric gluconate (FERRLECIT/NULECIT) IV  125 mg Intravenous Q M,W,F-HD  . insulin aspart  0-15 Units Subcutaneous TID WC  . insulin glargine  25 Units Subcutaneous QHS  . multivitamin  1 tablet Oral QHS  . sevelamer carbonate  1,600 mg Oral TID WC  . sodium chloride  500 mL Intravenous Once   Continuous Infusions: . sodium chloride 75 mL/hr at 01/28/16 2322  . argatroban 1.5 mcg/kg/min (01/29/16 1153)   PRN Meds:.  Micro Results Recent Results (from the past 240 hour(s))  Urine culture     Status: None   Collection Time: 01/22/16  4:36 PM  Result Value Ref Range Status   Specimen Description URINE, CATHETERIZED  Final   Special Requests NONE  Final   Culture NO GROWTH  Final   Report Status 01/23/2016 FINAL  Final    Radiology Reports Dg Chest 2 View  01/22/2016  CLINICAL DATA:  Acute onset weakness and lightheadedness at 1:15 p.m. today while undergoing dialysis. EXAM: CHEST  2 VIEW COMPARISON:   Single-view of the chest 09/01/2014 and CT chest 07/12/2014. FINDINGS: Dialysis catheter is noted. Aortic stent graft is in place. The lungs are clear. Heart size is normal. No pneumothorax or pleural effusion. IMPRESSION: No acute disease. Electronically Signed   By: Inge Rise M.D.   On: 01/22/2016 15:11    Time Spent in minutes 25   Louellen Molder M.D on 01/29/2016 at 2:43 PM  Between 7am to 7pm - Pager - [phone]  After 7pm go to [email or web address] - password [facility]  [facility] -  Office  [phone]

## 2016-01-29 NOTE — Progress Notes (Signed)
Pt returned from HD without argatroban or 0.9% sodium chloride infusing. Both sets of IV tubing disconnected from pt. MD notified.

## 2016-01-30 ENCOUNTER — Inpatient Hospital Stay (HOSPITAL_COMMUNITY): Payer: Medicare Other | Admitting: Anesthesiology

## 2016-01-30 ENCOUNTER — Encounter (HOSPITAL_COMMUNITY)
Admission: EM | Disposition: A | Discharge status: Home / Self Care | Payer: Self-pay | Source: Home / Self Care | Attending: Internal Medicine

## 2016-01-30 DIAGNOSIS — N185 Chronic kidney disease, stage 5: Secondary | ICD-10-CM

## 2016-01-30 HISTORY — PX: AV FISTULA PLACEMENT: SHX1204

## 2016-01-30 LAB — BASIC METABOLIC PANEL
Anion gap: 9 (ref 5–15)
BUN: 23 mg/dL — AB (ref 6–20)
CALCIUM: 8.8 mg/dL — AB (ref 8.9–10.3)
CHLORIDE: 105 mmol/L (ref 101–111)
CO2: 20 mmol/L — ABNORMAL LOW (ref 22–32)
CREATININE: 3.93 mg/dL — AB (ref 0.61–1.24)
GFR, EST AFRICAN AMERICAN: 16 mL/min — AB (ref >60)
GFR, EST NON AFRICAN AMERICAN: 14 mL/min — AB (ref >60)
Glucose, Bld: 168 mg/dL — ABNORMAL HIGH (ref 65–99)
POTASSIUM: 4.6 mmol/L (ref 3.5–5.1)
SODIUM: 134 mmol/L — AB (ref 135–145)

## 2016-01-30 LAB — SURGICAL PCR SCREEN
MRSA, PCR: NEGATIVE
Staphylococcus aureus: NEGATIVE

## 2016-01-30 LAB — CBC
HEMATOCRIT: 31.6 % — AB (ref 39.0–52.0)
Hemoglobin: 10 g/dL — ABNORMAL LOW (ref 13.0–17.0)
MCH: 27.9 pg (ref 26.0–34.0)
MCHC: 31.6 g/dL (ref 30.0–36.0)
MCV: 88 fL (ref 78.0–100.0)
PLATELETS: 89 10*3/uL — AB (ref 150–400)
RBC: 3.59 MIL/uL — AB (ref 4.22–5.81)
RDW: 15 % (ref 11.5–15.5)
WBC: 7.9 10*3/uL (ref 4.0–10.5)

## 2016-01-30 LAB — GLUCOSE, CAPILLARY
GLUCOSE-CAPILLARY: 201 mg/dL — AB (ref 65–99)
Glucose-Capillary: 109 mg/dL — ABNORMAL HIGH (ref 65–99)
Glucose-Capillary: 104 mg/dL — ABNORMAL HIGH (ref 65–99)
Glucose-Capillary: 106 mg/dL — ABNORMAL HIGH (ref 65–99)
Glucose-Capillary: 266 mg/dL — ABNORMAL HIGH (ref 65–99)

## 2016-01-30 LAB — HEMOGLOBIN A1C
HEMOGLOBIN A1C: 7.9 % — AB (ref 4.8–5.6)
Mean Plasma Glucose: 180 mg/dL

## 2016-01-30 LAB — HEPATITIS B SURFACE ANTIGEN: HEP B S AG: NEGATIVE

## 2016-01-30 LAB — APTT: APTT: 39 s — AB (ref 24–37)

## 2016-01-30 SURGERY — INSERTION OF ARTERIOVENOUS (AV) GORE-TEX GRAFT ARM
Anesthesia: Monitor Anesthesia Care | Site: Arm Upper | Laterality: Right

## 2016-01-30 MED ORDER — DIPHENHYDRAMINE HCL 50 MG/ML IJ SOLN
INTRAMUSCULAR | Status: DC | PRN
  Administered 2016-01-30: 25 mg via INTRAVENOUS

## 2016-01-30 MED ORDER — CALCIUM CARBONATE ANTACID 500 MG PO CHEW
400.0000 mg | CHEWABLE_TABLET | Freq: Three times a day (TID) | ORAL | Status: DC | PRN
  Administered 2016-01-30: 400 mg via ORAL
  Filled 2016-01-30: qty 2

## 2016-01-30 MED ORDER — PHENYLEPHRINE HCL 10 MG/ML IJ SOLN
INTRAMUSCULAR | Status: DC | PRN
  Administered 2016-01-30: 80 ug via INTRAVENOUS

## 2016-01-30 MED ORDER — FENTANYL CITRATE (PF) 250 MCG/5ML IJ SOLN
INTRAMUSCULAR | Status: AC
  Filled 2016-01-30: qty 5

## 2016-01-30 MED ORDER — PROPOFOL 500 MG/50ML IV EMUL
INTRAVENOUS | Status: DC | PRN
  Administered 2016-01-30: 30 ug/kg/min via INTRAVENOUS

## 2016-01-30 MED ORDER — LIDOCAINE HCL (CARDIAC) 20 MG/ML IV SOLN
INTRAVENOUS | Status: DC | PRN
  Administered 2016-01-30: 40 mg via INTRATRACHEAL

## 2016-01-30 MED ORDER — LIDOCAINE HCL (PF) 1 % IJ SOLN
INTRAMUSCULAR | Status: DC | PRN
  Administered 2016-01-30: 30 mL

## 2016-01-30 MED ORDER — STERILE WATER FOR IRRIGATION IR SOLN
Status: DC | PRN
  Administered 2016-01-30: 1000 mL

## 2016-01-30 MED ORDER — LIDOCAINE 2% (20 MG/ML) 5 ML SYRINGE
INTRAMUSCULAR | Status: AC
  Filled 2016-01-30: qty 5

## 2016-01-30 MED ORDER — DIPHENHYDRAMINE HCL 50 MG/ML IJ SOLN
INTRAMUSCULAR | Status: AC
  Filled 2016-01-30: qty 1

## 2016-01-30 MED ORDER — ARGATROBAN 50 MG/50ML IV SOLN: 1.5000 ug/kg/min | INTRAVENOUS | Status: DC

## 2016-01-30 MED ORDER — LIDOCAINE HCL (PF) 1 % IJ SOLN
INTRAMUSCULAR | Status: AC
  Filled 2016-01-30: qty 30

## 2016-01-30 MED ORDER — ARGATROBAN 50 MG/50ML IV SOLN
1.5000 ug/kg/min | INTRAVENOUS | Status: DC
  Filled 2016-01-30: qty 50

## 2016-01-30 MED ORDER — OXYCODONE-ACETAMINOPHEN 5-325 MG PO TABS
1.0000 | ORAL_TABLET | Freq: Four times a day (QID) | ORAL | Status: DC | PRN
  Administered 2016-01-30 – 2016-01-31 (×2): 1 via ORAL
  Filled 2016-01-30 (×2): qty 1

## 2016-01-30 MED ORDER — MIDAZOLAM HCL 2 MG/2ML IJ SOLN
INTRAMUSCULAR | Status: AC
  Filled 2016-01-30: qty 2

## 2016-01-30 MED ORDER — MIDAZOLAM HCL 5 MG/5ML IJ SOLN
INTRAMUSCULAR | Status: DC | PRN
  Administered 2016-01-30: 2 mg via INTRAVENOUS

## 2016-01-30 MED ORDER — 0.9 % SODIUM CHLORIDE (POUR BTL) OPTIME
TOPICAL | Status: DC | PRN
  Administered 2016-01-30: 1000 mL

## 2016-01-30 MED ORDER — SODIUM CHLORIDE 0.9 % IV SOLN
INTRAVENOUS | Status: DC | PRN
  Administered 2016-01-30: 500 mL

## 2016-01-30 MED ORDER — ONDANSETRON HCL 4 MG/2ML IJ SOLN
INTRAMUSCULAR | Status: AC
  Filled 2016-01-30: qty 2

## 2016-01-30 MED ORDER — PROPOFOL 10 MG/ML IV BOLUS
INTRAVENOUS | Status: AC
  Filled 2016-01-30: qty 20

## 2016-01-30 MED ORDER — SODIUM CHLORIDE 0.9 % IV SOLN
INTRAVENOUS | Status: DC
  Administered 2016-01-30: 09:00:00 via INTRAVENOUS

## 2016-01-30 MED ORDER — EPHEDRINE SULFATE 50 MG/ML IJ SOLN
INTRAMUSCULAR | Status: DC | PRN
  Administered 2016-01-30: 10 mg via INTRAVENOUS

## 2016-01-30 MED ORDER — FENTANYL CITRATE (PF) 250 MCG/5ML IJ SOLN
INTRAMUSCULAR | Status: DC | PRN
  Administered 2016-01-30: 100 ug via INTRAVENOUS

## 2016-01-30 SURGICAL SUPPLY — 29 items
ARMBAND PINK RESTRICT EXTREMIT (MISCELLANEOUS) ×3 IMPLANT
CANISTER SUCTION 2500CC (MISCELLANEOUS) IMPLANT
CLIP TI MEDIUM 6 (CLIP) ×3 IMPLANT
CLIP TI WIDE RED SMALL 6 (CLIP) ×3 IMPLANT
COVER PROBE W GEL 5X96 (DRAPES) ×3 IMPLANT
DECANTER SPIKE VIAL GLASS SM (MISCELLANEOUS) ×3 IMPLANT
ELECT REM PT RETURN 9FT ADLT (ELECTROSURGICAL) ×3
ELECTRODE REM PT RTRN 9FT ADLT (ELECTROSURGICAL) ×1 IMPLANT
GLOVE BIO SURGEON STRL SZ7 (GLOVE) ×3 IMPLANT
GLOVE BIOGEL PI IND STRL 7.5 (GLOVE) ×1 IMPLANT
GLOVE BIOGEL PI INDICATOR 7.5 (GLOVE) ×2
GOWN STRL REUS W/ TWL LRG LVL3 (GOWN DISPOSABLE) ×3 IMPLANT
GOWN STRL REUS W/TWL LRG LVL3 (GOWN DISPOSABLE) ×6
HEMOSTAT SPONGE AVITENE ULTRA (HEMOSTASIS) IMPLANT
KIT BASIN OR (CUSTOM PROCEDURE TRAY) ×3 IMPLANT
KIT ROOM TURNOVER OR (KITS) ×3 IMPLANT
LIQUID BAND (GAUZE/BANDAGES/DRESSINGS) ×3 IMPLANT
NS IRRIG 1000ML POUR BTL (IV SOLUTION) ×3 IMPLANT
PACK CV ACCESS (CUSTOM PROCEDURE TRAY) ×3 IMPLANT
PAD ARMBOARD 7.5X6 YLW CONV (MISCELLANEOUS) ×6 IMPLANT
SUT MNCRL AB 4-0 PS2 18 (SUTURE) ×6 IMPLANT
SUT PROLENE 5 0 C 1 24 (SUTURE) IMPLANT
SUT PROLENE 6 0 BV (SUTURE) ×6 IMPLANT
SUT PROLENE 7 0 BV 1 (SUTURE) ×6 IMPLANT
SUT SILK 2 0 FS (SUTURE) ×3 IMPLANT
SUT VIC AB 3-0 SH 27 (SUTURE) ×4
SUT VIC AB 3-0 SH 27X BRD (SUTURE) ×2 IMPLANT
UNDERPAD 30X30 INCONTINENT (UNDERPADS AND DIAPERS) ×3 IMPLANT
WATER STERILE IRR 1000ML POUR (IV SOLUTION) ×3 IMPLANT

## 2016-01-30 NOTE — Interval H&P Note (Signed)
[facility]  History and Physical Update  The patient was interviewed and re-examined.  The patient's previous History and Physical has been reviewed and is unchanged from Dr. Oneida Alar consult.  There is no change in the plan of care: right arm arteriovenous fistula vs graft placement.   Risk, benefits, and alternatives to access surgery were discussed.    The patient is aware the risks include but are not limited to: bleeding, infection, steal syndrome, nerve damage, ischemic monomelic neuropathy, failure to mature, need for additional procedures, death and stroke.    The patient agrees to proceed forward with the procedure.   Adele Barthel, MD [facility] Office: [phone] Pager: [phone]  01/30/2016, 7:29 AM

## 2016-01-30 NOTE — Progress Notes (Signed)
PROGRESS NOTE                                                                                                                                                                                                             Patient Demographics:    Travis Carlson, is a 70 y.o. male, DOB - 1946-01-22, VA:[id number]  Admit date - 01/22/2016   Admitting Physician Etta Quill, DO  Outpatient Primary MD for the patient is Parke Poisson, MD  LOS - 8  Outpatient Specialists: renal  Chief Complaint  Patient presents with  . Weakness       Brief Narrative  70 year old male with a history of Barrett's esophagus, ESRD, diabetes mellitus type 2, prostate cancer s/p TURP,colostomy status,  ileostomy presented with severe generalized weakness after HD. According to the patient, no fluid was taken off during dialysis as he was under his dry weight. The patient was recently discharged from hospitalization at [facility] from 01/05/2016 through 01/13/2016. During that hospitalization, the patient presented with abdominal pain, severe hyperkalemia and was declared new ESRD. The patient was initiated on hemodialysis initially and a right-sided IJ PermCath was placed. The patient had his first outpatient dialysis on 01/15/2016. The patient was diagnosed with viral gastroenteritis and pyelonephritis. Urinalysis on 01/06/2016 showed TNTC WBC, but urine culture was not obtained when reviewed on CareEverywhere. The patient was discharged home with cephalexin for 15 more doses. The patient states that he has proximally 7 doses left on the day of this admission. In the emergency department, the patient was noted to have serum potassium up to 6.9. He was treated with insulin, D50, bicarbonate, and calcium chloride. Repeat potassium was 5.2.  Hospital course prolonged due to ongoing low blood pressure and thrombocytopenia .      Subjective:   C/o  soreness over RUE AVF site.    Assessment  & Plan :    Thrombocytopenia New finding. Was normal until HD started. 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 Serotonin release assay came back negative.  Renal avoiding heparin with dialysis.  Appreciate hematology evaluation. D/w Dr Marin Olp today, recommends to discontinue argatroban since serotonin assay came back negative.  Will monitor platelets for another 24-48 hrs. If stable can be discharged home. Pt will follow up with Dr Marin Olp in few weeks.   anemai secondary to iron  def and renal disease. - Receiving Aranesp and  IV iron with HD. Hb improved. -Low vitamin B12 level being replenished.  Hypotension with hypovolemia Mostly pronounced with dialysis requiring IV fluid boluses. Avoiding fluid removal with dialysis when blood pressure is low. BP better now.  Hyperkalemia in the setting of ESRD  -pt endorses some dietary indiscretion. Resolved with dialysis. Counseled on diet adherence. Concern for allergies to HD dialysis membrane.     Generalized weakness -combination of hypovolemia and possible urinary tract infection . -Gets extremely weak after dialysis with drops in blood pressure. BP stable past few days.   Pyuria Has residual renal function and makes approximately 8-16 oz of urine daily  Has had multiple UTIs in the last few months. Also negative.  Discontinued  antibiotics.   ESRD Renal following. RUE AV fistula placed today.  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 Patient refused to contact his daughter  Disposition Plan  :  Home possibly in 48 hrs if platelets stable   Consults  :    Renal Vascular Hematology  Procedures  :  HD   DVT Prophylaxis  :  SCD  Lab Results  Component Value Date   PLT 89* 01/30/2016    Antibiotics  :    Anti-infectives    Start     Dose/Rate Route Frequency Ordered Stop   01/30/16 0915  cefUROXime (ZINACEF) 1.5 g in dextrose 5 % 50 mL IVPB     1.5 g 100 mL/hr over 30 Minutes Intravenous On call to O.R. 01/29/16 1459 01/30/16 0928   01/23/16 0900  cefTRIAXone (ROCEPHIN) 1 g in dextrose 5 % 50 mL IVPB  Status:  Discontinued     1 g 100 mL/hr over 30 Minutes Intravenous Every 24 hours 01/23/16 0822 01/28/16 0930        Objective:   Filed Vitals:   01/30/16 1121 01/30/16 1130 01/30/16 1136 01/30/16 1200  BP: 109/58  102/62 115/51  Pulse: 52 49 49 52  Temp:  97.4 F (36.3 C)  97.7 F (36.5 C)  TempSrc:    Oral  Resp: 11 12 12 16   Height:      Weight:      SpO2: 98% 97% 99% 98%    Wt Readings from Last 3 Encounters:  01/29/16 73.5 kg (162 lb 0.6 oz)  07/20/14 94.4 kg (208 lb 1.8 oz)     Intake/Output Summary (Last 24 hours) at 01/30/16 1600 Last data filed at 01/30/16 1140  Gross per 24 hour  Intake   1487 ml  Output   -479 ml  Net   1966 ml     Physical Exam  Gen:  NAD HEENT:Pallor +  moist mucosa, supple neck Chest: clear b/l, no added sounds, HD catheter CVS: N S1&S2, no murmurs, GI: soft, NT, ND, BS+, colostomy with good output Musculoskeletal: warm, no edema, RUE AV fistula site clean     Data Review:    CBC  Recent Labs Lab 01/26/16 0738 01/27/16 0854 01/28/16 0400 01/29/16 0526 01/29/16 1338 01/30/16 0410  WBC 7.9 5.3 6.0 6.8  --  7.9  HGB 8.5* 7.3* 9.3* 9.5* 9.5* 10.0*  HCT 28.0* 23.6* 29.7* 30.6* 28.0* 31.6*  PLT  90*  92* 78*  74* 67* 100*  --  89*  MCV 86.7 86.4 86.3 87.2  --  88.0  MCH 26.3 26.7 27.0 27.1  --  27.9  MCHC 30.4 30.9 31.3 31.0  --  31.6  RDW 15.1 15.2 15.1 15.0  --  15.0    Chemistries   Recent Labs Lab 01/24/16 1230 01/26/16 0737 01/27/16 0854  01/29/16 1338 01/30/16 0410  NA 136 133* 135 142 134*  K 5.3* 5.5* 4.6 4.4 4.6  CL 104 106 105 108 105  CO2 22 17* 21*  --  20*  GLUCOSE 97 175* 248* 207* 168*  BUN 31* 48* 36* 29* 23*  CREATININE 5.73* 7.15* 5.51* 4.30* 3.93*  CALCIUM 9.2 9.0 8.4*  --  8.8*  AST  --   --  15  --   --   ALT  --   --  10*  --   --   ALKPHOS  --   --  76  --   --   BILITOT  --   --  <0.1*  --   --    ------------------------------------------------------------------------------------------------------------------ No results for input(s): CHOL, HDL, LDLCALC, TRIG, CHOLHDL, LDLDIRECT in the last 72 hours.  Lab Results  Component Value Date   HGBA1C 7.9* 01/29/2016   ------------------------------------------------------------------------------------------------------------------ No results for input(s): TSH, T4TOTAL, T3FREE, THYROIDAB in the last 72 hours.  Invalid input(s): FREET3 ------------------------------------------------------------------------------------------------------------------ No results for input(s): VITAMINB12, FOLATE, FERRITIN, TIBC, IRON, RETICCTPCT in the last 72 hours.  Coagulation profile  Recent Labs Lab 01/25/16 0828 01/26/16 0738 01/27/16 0854  INR 1.23 2.14* 2.04*    No results for input(s): DDIMER in the last 72 hours.  Cardiac Enzymes No results for input(s): CKMB, TROPONINI, MYOGLOBIN in the last 168 hours.  Invalid input(s): CK ------------------------------------------------------------------------------------------------------------------ No results found for: BNP  Inpatient Medications  Scheduled Meds: . sodium chloride   Intravenous Once  . anticoagulant sodium citrate  5 mL Intravenous Once  . cyanocobalamin  1,000 mcg Intramuscular Daily  . [START ON 02/05/2016] darbepoetin (ARANESP) injection - DIALYSIS  60 mcg Intravenous Q Mon-HD  . dorzolamide-timolol  1 drop Left Eye BID  . ferric gluconate (FERRLECIT/NULECIT) IV  125 mg Intravenous Q  M,W,F-HD  . insulin aspart  0-15 Units Subcutaneous TID WC  . insulin glargine  25 Units Subcutaneous QHS  . multivitamin  1 tablet Oral QHS  . sevelamer carbonate  1,600 mg Oral TID WC  . sodium chloride  500 mL Intravenous Once   Continuous Infusions: . sodium chloride 75 mL/hr at 01/29/16 2200  . sodium chloride 10 mL/hr at 01/30/16 0850   PRN Meds:.  Micro Results Recent Results (from the past 240 hour(s))  Urine culture     Status: None   Collection Time: 01/22/16  4:36 PM  Result Value Ref Range Status   Specimen Description URINE, CATHETERIZED  Final   Special Requests NONE  Final   Culture NO GROWTH  Final   Report Status 01/23/2016 FINAL  Final  Surgical pcr screen     Status: None   Collection Time: 01/30/16  6:24 AM  Result Value Ref Range Status   MRSA, PCR NEGATIVE NEGATIVE Final   Staphylococcus aureus NEGATIVE NEGATIVE Final    Comment:        The Xpert SA Assay (FDA approved for NASAL specimens in patients over 77 years of age), is one component of a comprehensive surveillance program.  Test performance has been validated by [facility] for  patients greater than or equal to 31 year old. It is not intended to diagnose infection nor to guide or monitor treatment.     Radiology Reports Dg Chest 2 View  01/22/2016  CLINICAL DATA:  Acute onset weakness and lightheadedness at 1:15 p.m. today while undergoing dialysis. EXAM: CHEST  2 VIEW COMPARISON:  Single-view of the chest 09/01/2014 and CT chest 07/12/2014. FINDINGS: Dialysis catheter is noted. Aortic stent graft is in place. The lungs are clear. Heart size is normal. No pneumothorax or pleural effusion. IMPRESSION: No acute disease. Electronically Signed   By: Inge Rise M.D.   On: 01/22/2016 15:11    Time Spent in minutes 25   Louellen Molder M.D on 01/30/2016 at 4:00 PM  Between 7am to 7pm - Pager - [phone]  After 7pm go to [email or web address] - password [email or web address]  [facility] -  Office   [phone]

## 2016-01-30 NOTE — Transfer of Care (Signed)
Immediate Anesthesia Transfer of Care Note  Patient: Travis Carlson  Procedure(s) Performed: Procedure(s): Brachial vein transposistion first stage, right  (Right)  Patient Location: PACU  Anesthesia Type:MAC  Level of Consciousness: awake, alert  and oriented  Airway & Oxygen Therapy: Patient Spontanous Breathing and Patient connected to nasal cannula oxygen  Post-op Assessment: Report given to RN, Post -op Vital signs reviewed and stable and Patient moving all extremities X 4  Post vital signs: Reviewed and stable  Last Vitals:  Filed Vitals:   01/29/16 1959 01/30/16 0529  BP:  122/59  Pulse:  66  Temp: 36.9 C 36.8 C  Resp: 18 17    Last Pain:  Filed Vitals:   01/30/16 0951  PainSc: 0-No pain      Patients Stated Pain Goal: 0 ([phone] [id number])  Complications: No apparent anesthesia complications

## 2016-01-30 NOTE — Anesthesia Postprocedure Evaluation (Signed)
Anesthesia Post Note  Patient: Travis Carlson  Procedure(s) Performed: Procedure(s) (LRB): Brachial vein transposistion first stage, right  (Right)  Patient location during evaluation: PACU Anesthesia Type: MAC Level of consciousness: awake and alert Pain management: pain level controlled Vital Signs Assessment: post-procedure vital signs reviewed and stable Respiratory status: spontaneous breathing, nonlabored ventilation, respiratory function stable and patient connected to nasal cannula oxygen Cardiovascular status: stable and blood pressure returned to baseline Anesthetic complications: no    Last Vitals:  Filed Vitals:   01/30/16 1136 01/30/16 1200  BP: 102/62 115/51  Pulse: 49 52  Temp:  36.5 C  Resp: 12 16    Last Pain:  Filed Vitals:   01/30/16 1201  PainSc: 0-No pain                 Zenaida Deed

## 2016-01-30 NOTE — Progress Notes (Signed)
ANTICOAGULATION CONSULT NOTE - Follow Up Consult  Pharmacy Consult for Argatroban Indication: suspected HIT  Allergies  Allergen Reactions  . Bactrim [Sulfamethoxazole-Trimethoprim] Other (See Comments)    States muscle weakness for 2 days - unable to stand "it like to have killed me"   . 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 Lisinopril Other (See Comments)    Weakness  renal function abnormalities  . Metformin Anaphylaxis    GI Upset (intolerance) "it like to have killed me I couldn't get out of my chair for three days"    Patient Measurements: Height: 5\' 8"  (172.7 cm) Weight: 162 lb 0.6 oz (73.5 kg) IBW/kg (Calculated) : 68.4 Argatroban Dosing Weight: 73.5 kg  Vital Signs: Temp: 97.7 F (36.5 C) (06/20 1200) Temp Source: Oral (06/20 1200) BP: 115/51 mmHg (06/20 1200) Pulse Rate: 52 (06/20 1200)  Labs:  Recent Labs  01/28/16 0400 01/29/16 0526 01/29/16 1052 01/29/16 1338 01/30/16 0410  HGB 9.3* 9.5*  --  9.5* 10.0*  HCT 29.7* 30.6*  --  28.0* 31.6*  PLT 67* 100*  --   --  89*  APTT 64* 26 57*  --  39*  CREATININE  --   --   --  4.30* 3.93*    Estimated Creatinine Clearance: 16.9 mL/min (by C-G formula based on Cr of 3.93).  Assessment:   aPTT 39 seconds this morning, but Argatroban off at midnight for procedure today.  Now s/p 1st stage of brachial vein AV fistula. Argatroban to resume 6 hrs post-op, per brife discussion with Dr. Bridgett Larsson.  No bleeding reported.   aPTTs  have been therapeutic on Argatroban at 1.5 mcg/kg/min..    Heparin antibodies positive on 6/13.  SRA reported negative on 6/19.Marland Kitchen Platelet count up to 100K yesterday, 89K today.   Heme/Onc consulted 01/28/16.  Goal of Therapy:  aPTT 50-90 seconds Monitor platelets by anticoagulation protocol: Yes   Plan:   Resume Argatroban at 1.5 mcg/kg/min at ~1645 today.  aPTT ~4 hrs after Argatroban resumed.  Daily aPTT and CBC.  Will follow up  plans.  Arty Baumgartner, [place] Pager: [phone] 01/30/2016,2:38 PM

## 2016-01-30 NOTE — Progress Notes (Signed)
Pt with c/o indigestion and requesting 2 Tums. Pt states if he doesn't get some he will leave and go to the store to get his own. Pt informed that I was paging K. Schorr for notification. Dorthey Sawyer, RN

## 2016-01-30 NOTE — Anesthesia Preprocedure Evaluation (Addendum)
Anesthesia Evaluation  Patient identified by MRN, date of birth, ID band Patient awake    Reviewed: Allergy & Precautions, H&P , NPO status , Patient's Chart, lab work & pertinent test results  History of Anesthesia Complications Negative for: history of anesthetic complications  Airway Mallampati: II  TM Distance: >3 FB Neck ROM: full    Dental no notable dental hx.    Pulmonary former smoker,    Pulmonary exam normal breath sounds clear to auscultation       Cardiovascular + Peripheral Vascular Disease  Normal cardiovascular exam Rhythm:regular Rate:Normal  EF preserved at 65%   Neuro/Psych negative neurological ROS     GI/Hepatic negative GI ROS, Neg liver ROS,   Endo/Other  diabetes, Poorly Controlled  Renal/GU Dialysis and ESRFRenal disease     Musculoskeletal   Abdominal   Peds  Hematology negative hematology ROS (+)   Anesthesia Other Findings   Reproductive/Obstetrics negative OB ROS                            Anesthesia Physical Anesthesia Plan  ASA: III  Anesthesia Plan: MAC   Post-op Pain Management:    Induction: Intravenous  Airway Management Planned: Simple Face Mask  Additional Equipment:   Intra-op Plan:   Post-operative Plan:   Informed Consent: I have reviewed the patients History and Physical, chart, labs and discussed the procedure including the risks, benefits and alternatives for the proposed anesthesia with the patient or authorized representative who has indicated his/her understanding and acceptance.   Dental Advisory Given  Plan Discussed with: Anesthesiologist, CRNA and Surgeon  Anesthesia Plan Comments: (Check labs today, dialyzed yesterday)        Anesthesia Quick Evaluation

## 2016-01-30 NOTE — Progress Notes (Signed)
[name] Subjective:    Just back from  AVF  Insert/ tolerated HD yest. With  No uf / no cos this am   Objective Vital signs in last 24 hours: Filed Vitals:   01/29/16 1643 01/29/16 1746 01/29/16 1959 01/30/16 0529  BP: 143/70 123/62  122/59  Pulse: 52 61  66  Temp: 97.2 F (36.2 C) 97.6 F (36.4 C) 98.4 F (36.9 C) 98.2 F (36.8 C)  TempSrc: Oral Oral    Resp:  16 18 17   Height:      Weight: 73.5 kg (162 lb 0.6 oz)     SpO2: 99% 100% 98% 95%   Weight change:   Physical Exam: General:Thin WM NAD / Ox3/pleasant  Heart: RRR no rub, murmur Lungs: CTA bilat Abdomen: soft NT/ ND / Colostomybag present brown stool  Extremities: no pedal  edema Dialysis Access: right IJ perm cath/R BVT AVF pos bruit/ surg site no bleeding  Incision dry, hand warm, good grip  OP Dialysis Orders: [facility] MWF, 4 hours EDW 75 kg 400/800 2.0K/2.25 Ca  Heparin 5000 units IV per treatment Venofer 100 mg IV X 10 (Tsat 18 01/17/16) never started as op sec to admit to [facility]   Problem/Plan: 1. 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   Heme eval in progress / on argatroban by pharmacy.( held for VVS surgery) NO HEPARIN WITH HD. Heme has not left any additional recommendations since initial consultation and I don't understand what he is receiving anticoagulation for...why is argatroban needed? (can't use for HD). Platelets stable low - has not had heparin with HD since admission (but did get heparinized saline in the OR today). 2. ESRD recent start last week to HD.MWF schedule [place] / Hypotension on HD prob.sec to high output with colostomy and vol removal attempt on hd . BP's improvednow with NO UF ON HD and rinsing the dialyzer in case of membrane allergy. Cont HD MWF gradually increasing BFR and time.Rinse dialyzer prior to each use 3. R BVT AVF - 1st stage just done today. (Dr. Bridgett Larsson) + bruit. No steal as of yet  4. Hypotension-ECHO was normal, TSH normal. Refused ACTH stim test the  other day. BP's up  Now / today = 122/ 59 .  Wts up from 34 > 74kg./ 73.5 yest. ( NEVER HAD Established EDW as just started HD recently)and will not need UF with hd again tomorrow   5. Anemia - ESRD and Iron Def/  with  HGB 9.3 > 8.5>7.3.> 9.3>9.5 >10.0 today  Got 2u prbc 6/17. Getting Aranesp and Fe with HD. 6. Metabolic bone disease - VDRA at present. Ca 9.2 P 6.4- started binders PTH 210 no vit d  7. Nutrition - Albumin 3.4.>3.0 Renal diet. Supplements 8. DM: per primary.  9. Hx colectomy/ colostomy - 40 yrs ago, familial polyposis syndrome  10. Disposition - he is quite weak - drives himself to/from HD. Would benefit from PT evaluation.  Ernest Haber, PA-C [facility] Beeper [phone] 01/30/2016,8:09 AM  LOS: 8 days    I have seen and examined this patient and agree with plan and assessment in above note with highlighted additions. Had 1st stage of basilic transposition AVF done today, will be due for HD tomorrow. Lives alone, drives self to treatment, I think PT referral might be in order. Domenico Achord B,MD 01/30/2016 3:48 PM   Labs:   Recent Labs Lab 01/24/16 1230 01/26/16 0737 01/27/16 0854 01/29/16 1338 01/30/16 0410  NA 136 133* 135 142 134*  K 5.3* 5.5* 4.6 4.4 4.6  CL 104 106 105 108 105  CO2 22 17* 21*  --  20*  GLUCOSE 97 175* 248* 207* 168*  BUN 31* 48* 36* 29* 23*  CREATININE 5.73* 7.15* 5.51* 4.30* 3.93*  CALCIUM 9.2 9.0 8.4*  --  8.8*  PHOS 5.8* 6.4*  --   --   --    Liver Function Tests:  Recent Labs Lab 01/24/16 1230 01/26/16 0737 01/27/16 0854  AST  --   --  15  ALT  --   --  10*  ALKPHOS  --   --  76  BILITOT  --   --  <0.1*  PROT  --   --  6.5  ALBUMIN 3.4* 3.4* 3.0*     Recent Labs Lab 01/26/16 0738 01/27/16 0854 01/28/16 0400 01/29/16 0526 01/29/16 1338 01/30/16 0410  WBC 7.9 5.3 6.0 6.8  --  7.9  HGB 8.5* 7.3* 9.3* 9.5* 9.5* 10.0*  HCT 28.0* 23.6* 29.7* 30.6* 28.0* 31.6*  MCV 86.7 86.4 86.3 87.2  --   88.0  PLT 90*  92* 78*  74* 67* 100*  --  89*     Recent Labs Lab 01/29/16 1140 01/29/16 1744 01/29/16 1829 01/29/16 2109 01/30/16 0745  GLUCAP 221* 64* 135* 390* 109*    Medications: . sodium chloride 75 mL/hr at 01/29/16 2200  . argatroban Stopped (01/30/16 0002)   . [MAR Hold] sodium chloride   Intravenous Once  . anticoagulant sodium citrate  5 mL Intravenous Once  . cefUROXime (ZINACEF)  IV  1.5 g Intravenous On Call to OR  . [MAR Hold] cyanocobalamin  1,000 mcg Intramuscular Daily  . [MAR Hold] darbepoetin (ARANESP) injection - DIALYSIS  60 mcg Intravenous Q Mon-HD  . [MAR Hold] dorzolamide-timolol  1 drop Left Eye BID  . [MAR Hold] ferric gluconate (FERRLECIT/NULECIT) IV  125 mg Intravenous Q M,W,F-HD  . [MAR Hold] insulin aspart  0-15 Units Subcutaneous TID WC  . [MAR Hold] insulin glargine  25 Units Subcutaneous QHS  . [MAR Hold] multivitamin  1 tablet Oral QHS  . [MAR Hold] sevelamer carbonate  1,600 mg Oral TID WC  . [MAR Hold] sodium chloride  500 mL Intravenous Once

## 2016-01-30 NOTE — Anesthesia Procedure Notes (Signed)
Procedure Name: MAC Date/Time: 01/30/2016 9:19 AM Performed by: Mariea Clonts Pre-anesthesia Checklist: Patient identified, Patient being monitored, Emergency Drugs available, Timeout performed and Suction available Patient Re-evaluated:Patient Re-evaluated prior to inductionOxygen Delivery Method: Simple face mask

## 2016-01-30 NOTE — Interval H&P Note (Signed)
[facility]  History and Physical Update  I discussed in detailed staged brachial vein transposition.  The patient is aware of the advantages and disadvantages.  In his case, with known Heparin allergy, his long-term access patency would be better with an arteriovenous fistula.  The patient is aware that a staged brachial vein transposition requires a second operation to mobilize the fistula aware from the median nerve and brachial artery.   Adele Barthel, MD [facility] [place] Office: [phone] Pager: [phone]  01/30/2016, 9:06 AM
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
## 2016-01-30 NOTE — Op Note (Signed)
OPERATIVE NOTE   PROCEDURE: 1. right first stage brachial vein transposition (brachiobrachial arteriovenous fistula) placement  PRE-OPERATIVE DIAGNOSIS: end stage renal disease   POST-OPERATIVE DIAGNOSIS: same as above   SURGEON: Adele Barthel, MD  ASSISTANT(S): RNFA  ANESTHESIA: local and MAC  ESTIMATED BLOOD LOSS: 50 cc  FINDING(S): 1. Brachial vein: 4 mm, brachial artery: 4-5 mm 2. Palpable thrill and radial pulse at end of case  SPECIMEN(S):  none  INDICATIONS:   Travis Carlson is a 70 y.o. male who presents with acute on chronic renal failure.  The patient has started on hemodialysis with likely permanent need for such.  The patient is scheduled for right arteriovenous fistula vs. graft placement.   The patient is aware the risks include but are not limited to: bleeding, infection, steal syndrome, nerve damage, ischemic monomelic neuropathy, failure to mature, and need for additional procedures.  The patient is aware of the risks of the procedure and elects to proceed forward.  DESCRIPTION: After full informed written consent was obtained from the patient, the patient was brought back to the operating room and placed supine upon the operating table.  Prior to induction, the patient received IV antibiotics.   After obtaining adequate anesthesia, the patient was then prepped and draped in the standard fashion for a left arm access procedure.  I turned my attention first to identifying the patient's brachial vein and brachial artery.  Using SonoSite guidance, the location of these vessels were marked out on the skin.   At this point, I injected local anesthetic to obtain a field block of the antecubitum.  In total, I injected about 3 mL of 1% lidocaine without epinephrine.  I made a longitudinal incision at the level of the antecubitum and dissected through the subcutaneous tissue and fascia to gain exposure of the brachial artery.  This was noted to be 4-5 mm in diameter externally.   This was dissected out proximally and distally and controlled with vessel loops .  I then dissected out the lateral brachial vein.  This was noted to be 4 mm in diameter externally.  The distal segment of the vein was ligated with a  2-0 silk, and the vein was transected.  The proximal segment was iinterrogated with serial dilators.  The vein accepted up to a 3.5 mm dilator without any difficulty.  I then instilled the heparinized saline into the vein and clamped it.  At this point, I reset my exposure of the brachial artery and placed the artery under tension proximally and distally.  I made an arteriotomy with a #11 blade, and then I extended the arteriotomy with a Potts scissor.  I injected heparinized saline proximal and distal to this arteriotomy.  The vein was then sewn to the artery in an end-to-side configuration with a running stitch of 7-0 Prolene.  Prior to completing this anastomosis, I allowed the vein and artery to backbleed.  There was no evidence of clot from any vessels.  I completed the anastomosis in the usual fashion and then released all vessel loops and clamps.  There was a palpable thrill in the venous outflow, and there was a palpable radial pulse.  At this point, I irrigated out the surgical wound.  There was no further active bleeding.  The subcutaneous tissue was reapproximated with a running stitch of 3-0 Vicryl.  The skin was then reapproximated with a running subcuticular stitch of 4-0 Vicryl.  The skin was then cleaned, dried, and reinforced with Dermabond.  The patient tolerated this procedure well.   COMPLICATIONS: none  CONDITION: stable   Adele Barthel, MD [facility] [place] Office: [phone] Pager: [phone]  01/30/2016, 10:22 AM

## 2016-01-30 NOTE — Progress Notes (Signed)
Pt NPO status reiterated to pt. All liquids and food removed from pt room. Pt states "if I really want to I will drink out of the faucet". Pt educated on the increased risk of aspiration during his procedure should he chose not to comply with his NPO status. Pt argatroban also stopped per pre-procedural order. Dorthey Sawyer, RN

## 2016-01-31 ENCOUNTER — Encounter (HOSPITAL_COMMUNITY): Payer: Self-pay | Admitting: Vascular Surgery

## 2016-01-31 LAB — RENAL FUNCTION PANEL
Albumin: 2.9 g/dL — ABNORMAL LOW (ref 3.5–5.0)
Anion gap: 10 (ref 5–15)
BUN: 25 mg/dL — AB (ref 6–20)
CHLORIDE: 108 mmol/L (ref 101–111)
CO2: 19 mmol/L — ABNORMAL LOW (ref 22–32)
Calcium: 8.7 mg/dL — ABNORMAL LOW (ref 8.9–10.3)
Creatinine, Ser: 4.94 mg/dL — ABNORMAL HIGH (ref 0.61–1.24)
GFR calc Af Amer: 12 mL/min — ABNORMAL LOW (ref >60)
GFR calc non Af Amer: 11 mL/min — ABNORMAL LOW (ref >60)
GLUCOSE: 230 mg/dL — AB (ref 65–99)
POTASSIUM: 4.6 mmol/L (ref 3.5–5.1)
Phosphorus: 4.1 mg/dL (ref 2.5–4.6)
Sodium: 137 mmol/L (ref 135–145)

## 2016-01-31 LAB — CBC
HEMATOCRIT: 31 % — AB (ref 39.0–52.0)
Hemoglobin: 9.7 g/dL — ABNORMAL LOW (ref 13.0–17.0)
MCH: 28.1 pg (ref 26.0–34.0)
MCHC: 31.3 g/dL (ref 30.0–36.0)
MCV: 89.9 fL (ref 78.0–100.0)
PLATELETS: 101 10*3/uL — AB (ref 150–400)
RBC: 3.45 MIL/uL — ABNORMAL LOW (ref 4.22–5.81)
RDW: 15.4 % (ref 11.5–15.5)
WBC: 6 10*3/uL (ref 4.0–10.5)

## 2016-01-31 LAB — HEPATITIS B SURFACE ANTIBODY,QUALITATIVE: HEP B S AB: REACTIVE

## 2016-01-31 LAB — HEPATITIS B CORE ANTIBODY, TOTAL: Hep B Core Total Ab: NEGATIVE

## 2016-01-31 LAB — GLUCOSE, CAPILLARY: Glucose-Capillary: 82 mg/dL (ref 65–99)

## 2016-01-31 MED ORDER — RENA-VITE PO TABS: 1.0000 | ORAL_TABLET | Freq: Every day | ORAL | Status: AC

## 2016-01-31 MED ORDER — OXYCODONE-ACETAMINOPHEN 5-325 MG PO TABS
ORAL_TABLET | ORAL | Status: AC
  Filled 2016-01-31: qty 1

## 2016-01-31 MED ORDER — SEVELAMER CARBONATE 800 MG PO TABS: 1600.0000 mg | ORAL_TABLET | Freq: Three times a day (TID) | ORAL | Status: AC

## 2016-01-31 MED ORDER — INSULIN DETEMIR 100 UNIT/ML ~~LOC~~ SOLN: 25.0000 [IU] | Freq: Every day | SUBCUTANEOUS | Status: DC

## 2016-01-31 NOTE — Evaluation (Signed)
Physical Therapy Evaluation Patient Details Name: Travis Carlson MRN: [id number] DOB: March 15, 1946 Today's Date: 01/31/2016   History of Present Illness  70 year old male with a history of Barrett's esophagus, ESRD, diabetes mellitus type 2, prostate cancer s/p TURP,colostomy status, ileostomy presented with severe generalized weakness after HD. According to the patient, no fluid was taken off during dialysis as he was under his dry weight. The patient was recently discharged from hospitalization at [facility] from 01/05/2016 through 01/13/2016. During that hospitalization, the patient presented with abdominal pain, severe hyperkalemia and was declared new ESRD. The patient was initiated on hemodialysis initially and a right-sided IJ PermCath was placed. The patient had his first outpatient dialysis on 01/15/2016. The patient was diagnosed with viral gastroenteritis and pyelonephritis. Urinalysis on 01/06/2016 showed TNTC WBC, but urine culture was not obtained when reviewed on [facility]where. The patient was discharged home with cephalexin for 15 more doses. The patient states that he has proximally 7 doses left on the day of this admission.  Clinical Impression   Patient evaluated by Physical Therapy with no further acute PT needs identified. All education has been completed and the patient has no further questions.  See below for any follow-up Physical Therapy or equipment needs. PT is signing off. Thank you for this referral.     Follow Up Recommendations No PT follow up    Equipment Recommendations  None recommended by PT    Recommendations for Other Services       Precautions / Restrictions        Mobility  Bed Mobility Overal bed mobility: Independent                Transfers Overall transfer level: Independent                  Ambulation/Gait Ambulation/Gait assistance: Independent Ambulation Distance (Feet): 200 Feet Assistive device: None Gait  Pattern/deviations: Step-through pattern     General Gait Details: Overall managing well  Stairs Stairs: Yes Stairs assistance: Supervision Stair Management: One rail Right;Forwards;Step to pattern;Alternating pattern (step-to ascending, alternating descending) Number of Stairs: 2 General stair comments: no gross difficulty  Wheelchair Mobility    Modified Rankin (Stroke Patients Only)       Balance                                             Pertinent Vitals/Pain Pain Assessment: Faces Faces Pain Scale: Hurts a little bit Pain Location: Some soreness at new AV fistula site Pain Descriptors / Indicators: Aching Pain Intervention(s): Monitored during session    Home Living Family/patient expects to be discharged to:: Private residence Living Arrangements: Alone Available Help at Discharge: Friend(s);Available PRN/intermittently Type of Home: House Home Access: Stairs to enter   [employer] of Steps: 1 Home Layout: One level Home Equipment: None      Prior Function Level of Independence: Independent               Hand Dominance        Extremity/Trunk Assessment   Upper Extremity Assessment: Overall WFL for tasks assessed           Lower Extremity Assessment: Overall WFL for tasks assessed         Communication   Communication: No difficulties  Cognition Arousal/Alertness: Awake/alert Behavior During Therapy: WFL for tasks assessed/performed Overall Cognitive Status: Within Functional Limits for tasks assessed  General Comments      Exercises        Assessment/Plan    PT Assessment Patent does not need any further PT services  PT Diagnosis Generalized weakness   PT Problem List    PT Treatment Interventions     PT Goals (Current goals can be found in the Care Plan section) Acute Rehab PT Goals Patient Stated Goal: wants to get home today PT Goal Formulation: All  assessment and education complete, DC therapy    Frequency     Barriers to discharge        Co-evaluation               End of Session   Activity Tolerance: Patient tolerated treatment well Patient left: in bed;with call bell/phone within reach (sitting EOB) Nurse Communication: Mobility status (OK for dc)         [id number] PT Time Calculation (min) (ACUTE ONLY): 12 min   Charges:   PT Evaluation $PT Eval Low Complexity: 1 Procedure     PT G CodesQuin Hoop 01/31/2016, 3:55 PM  Roney Marion, [place] Pager [phone] Office [phone]

## 2016-01-31 NOTE — Progress Notes (Signed)
Pt provided with discharge instruction including information on follow up appointments as well as new medications. Pt verbalized understanding of all information. IV was dc'd without complication. Pt escorted out via wheelchair by NT.

## 2016-01-31 NOTE — Discharge Summary (Signed)
[facility] Discharge Summary   Patient: Travis Carlson [id number]   PCP: Parke Poisson, MD DOB: 1946-02-19   Date of admission: 01/22/2016   Date of discharge: 01/31/2016     Discharge Diagnoses:  Principal Problem:   Hyperkalemia, diminished renal excretion Active Problems:   Bladder tumor   ESRD (end stage renal disease) on dialysis ([facility])   DM2 (diabetes mellitus, type 2) (HCC)   UTI (urinary tract infection)   Generalized weakness   Hyperkalemia  Admitted From: Home Disposition:  Home  Recommendations for Outpatient Follow-up:  1. Please follow-up with PCP with CBC 2. Please follow-up with vascular surgery as well as hematology as recommended.   Follow-up Information    Follow up with Parke Poisson, MD. Schedule an appointment as soon as possible for a visit in 1 week.   Specialty:  Internal Medicine   Contact information:   [address] [address] [phone]       Schedule an appointment as soon as possible for a visit with Adele Barthel, MD.   Specialties:  Vascular Surgery, Cardiology   Why:  in 4-6 weeks for follow up on fistula   Contact information:   [address] [place] [address] [phone]       Follow up with Volanda Napoleon, MD. Schedule an appointment as soon as possible for a visit in 3 weeks.   Specialty:  Oncology   Why:  for follow up on platelets   Contact information:   [address] [place] [address] [phone]      Diet recommendation: Renal diet carb modified  Activity: The patient is advised to gradually reintroduce usual activities.  Discharge Condition: good  Code Status: Full code  History of present illness: As per the H and P dictated on admission, "Travis Carlson is a 70 y.o. male with medical history significant of CKD stage 5, DM2, prostate cancer s/p TURP, ileostomy, just had first ever session of dialysis earlier today. Due to being under his dry weight, they  took no fluid off at the dialysis session according to patient. After dialysis patient had severe weakness. He isnt sure how long he was at dialysis for. No lightheadedness or near syncope, just profound tiredness and low energy. He presented to the ED.  Review of records from [facility]: patient recently had RIJ placed for AKI on CKD and plans to start dialysis. Recent admission for presumed pyelonephritis, completed course of keflex, UA had no growth though. Review of recent urine cultures demonstrates a number with either no growth or just yeast growth."  Hospital Course:  Summary of his active problems in the hospital is as following. Thrombocytopenia New finding. Was normal until HD started.  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 Serotonin release assay came back negative.  Renal avoiding heparin with dialysis.  Appreciate hematology evaluation. D/w Dr Marin Olp, recommends to discontinue argatroban since serotonin assay came back negative.  Pt will follow up with Dr Marin Olp in few weeks.  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 - Receiving Aranesp and IV iron with HD. Hb improved. -Low vitamin B12 level being replenished.  Hypotension with hypovolemia Mostly pronounced with dialysis requiring IV fluid boluses. Avoiding fluid removal with dialysis when blood pressure is low. BP better now.  Hyperkalemia in the setting of ESRD  -pt endorses some dietary indiscretion. Resolved with dialysis. Counseled on diet adherence. Concern for allergies to HD dialysis membrane.   Generalized weakness -  combination of hypovolemia and possible urinary tract infection . -Gets extremely weak after dialysis with drops in blood pressure. BP stable past few days.  Pyuria Has residual renal function and makes approximately 8-16 oz of urine daily Has had multiple UTIs in the last few months. Also negative. Discontinuedantibiotics.  ESRD Renal following. RUE AV  fistula placed. Patient will follow up with vascular surgery.  Diabetes mellitus type 2 -Continue reduced dose Levemir, 25 units at bedtime -NovoLog sliding scale - Hemoglobin A1c 7.9  History of familial polyposis syndrome -Status post colectomy with ostomy -Status post Whipple's procedure 1996  All other chronic medical condition were stable during the hospitalization.  Patient was seen by physical therapy, who recommended no therapy follow-up required. On the day of the discharge the patient's platelets improved as well as potassium, and no other acute medical condition were reported by patient. the patient was felt safe to be discharge at home with family.  Procedures and Results:  Hemodialysis  Echocardiogram Study Conclusions  - Left ventricle: The cavity size was normal. Systolic function was  normal. The estimated ejection fraction was in the range of 60%  to 65%. Wall motion was normal; there were no regional wall  motion abnormalities. Left ventricular diastolic function  parameters were normal. - Atrial septum: No defect or patent foramen ovale was identified   Vascular Upper extremity vein mapping.  Right first stage of brachial vein transposition for AV fistula placement  Consultations:  Nephrology  Hematology  Vascular surgery  DISCHARGE MEDICATION: Discharge Medication List as of 01/31/2016  5:02 PM    START taking these medications   Details  multivitamin (RENA-VIT) TABS tablet Take 1 tablet by mouth at bedtime., Starting 01/31/2016, Until Discontinued, Normal    sevelamer carbonate (RENVELA) 800 MG tablet Take 2 tablets (1,600 mg total) by mouth 3 (three) times daily with meals., Starting 01/31/2016, Until Discontinued, Normal      CONTINUE these medications which have CHANGED   Details  insulin detemir (LEVEMIR) 100 UNIT/ML injection Inject 0.25 mLs (25 Units total) into the skin daily., Starting 01/31/2016, Until Discontinued, Normal        CONTINUE these medications which have NOT CHANGED   Details  diazepam (VALIUM) 5 MG tablet Take 1 tablet (5 mg total) by mouth every 8 (eight) hours as needed., Starting 07/22/2014, Until Discontinued, Print    dorzolamide-timolol (COSOPT) 22.3-6.8 MG/ML ophthalmic solution Place 1 drop into the left eye 2 (two) times daily., Starting 04/19/2014, Until Discontinued, Historical Med    insulin aspart (NOVOLOG) 100 UNIT/ML injection Inject 0-15 Units into the skin 3 (three) times daily before meals. Per sliding scale, Until Discontinued, Historical Med    Multiple Vitamin (MULTIVITAMIN WITH MINERALS) TABS tablet Take 1 tablet by mouth daily., Until Discontinued, Historical Med    traMADol (ULTRAM) 50 MG tablet Take 2 tablets (100 mg total) by mouth every 6 (six) hours., Starting 07/22/2014, Until Discontinued, Print       Allergies  Allergen Reactions  . Bactrim [Sulfamethoxazole-Trimethoprim] Other (See Comments)    States muscle weakness for 2 days - unable to stand "it like to have killed me"   . 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 Lisinopril Other (See Comments)    Weakness  renal function abnormalities  . Metformin Anaphylaxis    GI Upset (intolerance) "it like to have killed me I couldn't get out of my chair for three days"   Discharge Instructions  Diet renal [id number]    Complete by:  As directed      Discharge instructions    Complete by:  As directed   It is important that you read following instructions as well as go over your medication list with RN to help you understand your care after this hospitalization.  Discharge Instructions: Please follow-up with PCP in one week  Please request your primary care physician to go over all Hospital Tests and Procedure/Radiological results at the follow up,  Please get all Hospital records sent to your PCP by signing hospital release before you go home.   Do not  drive, operating heavy machinery, perform activities at heights, swimming or participation in water activities or provide baby sitting services while your are on Pain, Sleep and Anxiety Medications; until you have been seen by Primary Care Physician or a Neurologist and advised to do so again. Do not take more than prescribed Pain, Sleep and Anxiety Medications. You were cared for by a hospitalist during your hospital stay. If you have any questions about your discharge medications or the care you received while you were in the hospital after you are discharged, you can call the unit and ask to speak with the hospitalist on call if the hospitalist that took care of you is not available.  Once you are discharged, your primary care physician will handle any further medical issues. Please note that NO REFILLS for any discharge medications will be authorized once you are discharged, as it is imperative that you return to your primary care physician (or establish a relationship with a primary care physician if you do not have one) for your aftercare needs so that they can reassess your need for medications and monitor your lab values. You Must read complete instructions/literature along with all the possible adverse reactions/side effects for all the Medicines you take and that have been prescribed to you. Take any new Medicines after you have completely understood and accept all the possible adverse reactions/side effects. Wear Seat belts while driving. If you have smoked or chewed Tobacco in the last 2 yrs please stop smoking and/or stop any Recreational drug use.     Increase activity slowly    Complete by:  As directed           Discharge Exam: Filed Weights   01/29/16 1643 01/31/16 0654 01/31/16 1024  Weight: 73.5 kg (162 lb 0.6 oz) 73.1 kg (161 lb 2.5 oz) 73 kg (160 lb 15 oz)   Filed Vitals:   01/31/16 1024 01/31/16 1139  BP: 124/64 119/51  Pulse: 64 63  Temp: 98.1 F (36.7 C) 97.9 F (36.6  C)  Resp: 18 18   General: Appear in no distress, no Rash; Oral Mucosa moist. Cardiovascular: S1 and S2 Present, no Murmur, no JVD Respiratory: Bilateral Air entry present and Clear to Auscultation, no Crackles, no wheezes Abdomen: Bowel Sound present, Soft and no tenderness Extremities: no Pedal edema, no calf tenderness Neurology: Grossly no focal neuro deficit.  The results of significant diagnostics from this hospitalization (including imaging, microbiology, ancillary and laboratory) are listed below for reference.    Significant Diagnostic Studies: Dg Chest 2 View  01/22/2016  CLINICAL DATA:  Acute onset weakness and lightheadedness at 1:15 p.m. today while undergoing dialysis. EXAM: CHEST  2 VIEW COMPARISON:  Single-view of the chest 09/01/2014 and CT chest 07/12/2014. FINDINGS: Dialysis catheter is noted. Aortic stent graft is in place. The lungs are clear. Heart size is normal. No pneumothorax  or pleural effusion. IMPRESSION: No acute disease. Electronically Signed   By: Inge Rise M.D.   On: 01/22/2016 15:11   Microbiology: Recent Results (from the past 240 hour(s))  Urine culture     Status: None   Collection Time: 01/22/16  4:36 PM  Result Value Ref Range Status   Specimen Description URINE, CATHETERIZED  Final   Special Requests NONE  Final   Culture NO GROWTH  Final   Report Status 01/23/2016 FINAL  Final  Surgical pcr screen     Status: None   Collection Time: 01/30/16  6:24 AM  Result Value Ref Range Status   MRSA, PCR NEGATIVE NEGATIVE Final   Staphylococcus aureus NEGATIVE NEGATIVE Final    Comment:        The Xpert SA Assay (FDA approved for NASAL specimens in patients over 34 years of age), is one component of a comprehensive surveillance program.  Test performance has been validated by [facility] for patients greater than or equal to 47 year old. It is not intended to diagnose infection nor to guide or monitor treatment.    Labs: CBC:  Recent  Labs Lab 01/27/16 0854 01/28/16 0400 01/29/16 0526 01/29/16 1338 01/30/16 0410 01/31/16 0617  WBC 5.3 6.0 6.8  --  7.9 6.0  HGB 7.3* 9.3* 9.5* 9.5* 10.0* 9.7*  HCT 23.6* 29.7* 30.6* 28.0* 31.6* 31.0*  MCV 86.4 86.3 87.2  --  88.0 89.9  PLT 78*  74* 67* 100*  --  89* [id number]*   Basic Metabolic Panel:  Recent Labs Lab 01/26/16 0737 01/27/16 0854 01/29/16 1338 01/30/16 0410 01/31/16 0711  NA 133* 135 142 134* 137  K 5.5* 4.6 4.4 4.6 4.6  CL 106 105 108 105 108  CO2 17* 21*  --  20* 19*  GLUCOSE 175* 248* 207* 168* 230*  BUN 48* 36* 29* 23* 25*  CREATININE 7.15* 5.51* 4.30* 3.93* 4.94*  CALCIUM 9.0 8.4*  --  8.8* 8.7*  PHOS 6.4*  --   --   --  4.1   Liver Function Tests:  Recent Labs Lab 01/26/16 0737 01/27/16 0854 01/31/16 0711  AST  --  15  --   ALT  --  10*  --   ALKPHOS  --  76  --   BILITOT  --  <0.1*  --   PROT  --  6.5  --   ALBUMIN 3.4* 3.0* 2.9*   CBG:  Recent Labs Lab 01/30/16 1051 01/30/16 1158 01/30/16 1716 01/30/16 2106 01/31/16 1137  GLUCAP 104* 106* 266* 201* 82   Time spent: 30 minutes  Signed:  Tyneisha Hegeman  [facility] 01/31/2016   , 5:26 PM

## 2016-01-31 NOTE — Progress Notes (Signed)
Travis Carlson CBC looks a little bit better. His platelet count is coming up. It is now 101,000. I think that his pancytopenia is multifactorial.  I think his anemia clearly is multifactorial. It is iron deficient. His erythropoietin level was incredibly low. It sounds like he is getting Aranesp and iron with hemodialysis. I will know if he needs any further or any higher dose of Aranesp.  His serotonin release assay was normal. As such, I just don't think that he has HIT. Of course, the assay does not totally exclude the diagnosis of HIT. I Would still probably avoid heparin.  There is no obvious bleeding.  Otherwise, he is to be doing okay. He had an AV fistula placed yesterday. There are no problems with this.  We will follow him up as an outpatient.  Please make an appointment for me to see him in about 3 weeks or so.  Lum Keas

## 2016-01-31 NOTE — Progress Notes (Signed)
PT Cancellation Note  Patient Details Name: Travis Carlson MRN: [id number] DOB: [birth year]   Cancelled Treatment:    Reason Eval/Treat Not Completed: Patient at procedure or test/unavailable   Currently in HD;  Will follow up later today as time allows, especially given likely dc soon;  Otherwise, will follow up for PT tomorrow;   Thank you,  Roney Marion, [place] Pager [phone] Office [phone]     Roney Marion [facility] 01/31/2016, 8:38 AM

## 2016-01-31 NOTE — Procedures (Signed)
I have personally attended this patient's dialysis session.   No heparin (though probably not HIT +) 2K bath  No volume off Pre weight 73 no edema TDC 400  Jamal Maes, MD [facility] [phone] Pager 01/31/2016, 8:05 AM

## 2016-01-31 NOTE — Progress Notes (Signed)
[name]  Subjective/Interval Events:    Seen in HD Arm sore from AVM Overall feels much better Thinks he will be able to manage at home alone  Objective Vital signs in last 24 hours: Filed Vitals:   01/30/16 1957 01/31/16 0456 01/31/16 0654 01/31/16 0700  BP: 121/54 103/39 124/59 115/59  Pulse: 84 68 60 66  Temp: 98.2 F (36.8 C) 98.2 F (36.8 C) 98.3 F (36.8 C)   TempSrc:   Oral   Resp: 18 20 20 18   Height:      Weight:   73.1 kg (161 lb 2.5 oz)   SpO2: 97% 97% 97%    Weight change: -0.1 kg (-3.5 oz)  Physical Exam: Seen on HD Think older WM, pleasant Lungs clear anteriorly Regular S1S2 No s3 Abd soft and not tender. Brown stool ostomy bag. No edema whatsoever of LE's R BVT AVF (6/20) + bruit, some bruising and mild erythema surrounding incision which is clean and dry R IJ TDC with tape rash    Labs:   Recent Labs Lab 01/24/16 1230 01/26/16 0737 01/27/16 0854 01/29/16 1338 01/30/16 0410 01/31/16 0711  NA 136 133* 135 142 134* 137  K 5.3* 5.5* 4.6 4.4 4.6 4.6  CL 104 106 105 108 105 108  CO2 22 17* 21*  --  20* 19*  GLUCOSE 97 175* 248* 207* 168* 230*  BUN 31* 48* 36* 29* 23* 25*  CREATININE 5.73* 7.15* 5.51* 4.30* 3.93* 4.94*  CALCIUM 9.2 9.0 8.4*  --  8.8* 8.7*  PHOS 5.8* 6.4*  --   --   --  4.1     Recent Labs Lab 01/26/16 0737 01/27/16 0854 01/31/16 0711  AST  --  15  --   ALT  --  10*  --   ALKPHOS  --  76  --   BILITOT  --  <0.1*  --   PROT  --  6.5  --   ALBUMIN 3.4* 3.0* 2.9*     Recent Labs Lab 01/27/16 0854 01/28/16 0400 01/29/16 0526 01/29/16 1338 01/30/16 0410 01/31/16 0617  WBC 5.3 6.0 6.8  --  7.9 6.0  HGB 7.3* 9.3* 9.5* 9.5* 10.0* 9.7*  HCT 23.6* 29.7* 30.6* 28.0* 31.6* 31.0*  MCV 86.4 86.3 87.2  --  88.0 89.9  PLT 78*  74* 67* 100*  --  89* 101*     Recent Labs Lab 01/30/16 0745 01/30/16 1051 01/30/16 1158 01/30/16 1716 01/30/16 2106  GLUCAP 109* 104* 106* 266* 201*    Medications: .  sodium chloride 75 mL/hr at 01/29/16 2200  . sodium chloride 10 mL/hr at 01/30/16 0850   . sodium chloride   Intravenous Once  . anticoagulant sodium citrate  5 mL Intravenous Once  . cyanocobalamin  1,000 mcg Intramuscular Daily  . [START ON 02/05/2016] darbepoetin (ARANESP) injection - DIALYSIS  60 mcg Intravenous Q Mon-HD  . dorzolamide-timolol  1 drop Left Eye BID  . ferric gluconate (FERRLECIT/NULECIT) IV  125 mg Intravenous Q M,W,F-HD  . insulin aspart  0-15 Units Subcutaneous TID WC  . insulin glargine  25 Units Subcutaneous QHS  . multivitamin  1 tablet Oral QHS  . sevelamer carbonate  1,600 mg Oral TID WC  . sodium chloride  500 mL Intravenous Once    OP Dialysis Orders:  [facility] MWF,  4 hours  EDW 75 kg 400/800 2.0K/2.25 Ca  Heparin 5000 units IV per treatment Venofer 100 mg IV X 10 (Tsat 18 01/17/16) iron  initiated post adm  Problem/Plan:  1. 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 Getting no heparin with HD. Received heparinized saline in the OR yesterday during AVF creation. Platelets are actually higher today. Heme says "don't think he has HIT but would still probably avoid heparin". We will not give with HD for now, follow plts (if we have outpt issues with clotting dialyzers we will probably re-initiate with careful monitoring of platelets) 2. ESRD - new start in [place] MWF last week. Hypotension (profound) with HD likely volume issue. However we are rinsing dialyzer in case membrane issue. BP stable, his osotomy output negates fluid gains to large degree. Will have to be VERY careful with fluid removal. recent start last week to HD.Pre HD weight today 73 - no volume off. 3. R BVT AVF - 1st stage 6/20. (Dr. Bridgett Larsson) + bruit. No steal  4. Tape rash - changed to clear dressing around HD cath today 5. Hypotension-ECHO was normal, TSH normal. Refused ACTH stim test. EDW will be 73-74  6. Anemia - s/p. 2u prbc 6/17. Getting Aranesp rec'd 40 on Monday, have increased  dose to 60- QMonday  and Fe load with HD (1st dose was 6/19). 7. Metabolic bone disease - no VDRA at present. Sevelamer 2 ac PTH 210 no vit d  8. Nutrition - Alb <4. Renal diet. Supplements 9. DM: per primary.  10. Hx colectomy/ colostomy - 40 yrs ago, familial polyposis syndrome  11. Disposition - he is quite weak - drives himself to/from HD. Would benefit from PT evaluation. Anticipate home soon.  Jamal Maes, MD [facility] [phone] Pager 01/31/2016, 8:04 AM

## 2016-01-31 NOTE — Progress Notes (Signed)
Pt back from HD requesting pain medicine for surgical pain. Pt last received percocet at 0715 thus not due again until 1315. Pt upset that he has to wait 1 hour for pain med. Offered pt tylenol and pt refused.

## 2016-01-31 NOTE — Progress Notes (Signed)
  Postoperative hemodialysis access     Date of Surgery:  01/30/16 Surgeon: Bridgett Larsson   Subjective:  C/o some pain at the incision site, but this is better than it was yesterday.  Denies any pain or numbness in his right hand  PHYSICAL EXAMINATION:  Filed Vitals:   01/31/16 0930 01/31/16 1000  BP: 105/59 109/60  Pulse: 61 62  Temp:    Resp:      Incision is clean and dry Sensation in digits is intact;  There is bruit. The graft/fistula is not palpable    ASSESSMENT/PLAN:  Travis Carlson is a 70 y.o. year old male who is s/p right 1st stage BrVT.  -graft/fistula is patent -pt does not have evidence of steal sx -f/u with Dr. Bridgett Larsson in 4-6 weeks to check maturation of AVF -pt is concerned about his car at the dialysis unit-may benefit from case management for follow up and a ride at discharge. -will sign off-call as needed.   Leontine Locket, PA-C [facility] [phone]

## 2016-02-03 ENCOUNTER — Telehealth: Payer: Self-pay | Admitting: Vascular Surgery

## 2016-02-03 NOTE — Telephone Encounter (Signed)
-----   Message from Mena Goes, RN sent at 01/30/2016 11:25 AM EDT ----- Regarding: schedule   ----- Message -----    From: Conrad [place], MD    Sent: 01/30/2016  10:25 AM      To: [facility] [address]  Jaiver Deni [id number] 1945/10/25  PROCEDURE: right first stage brachial vein transposition (brachiobrachial arteriovenous fistula) placement  Asst: RNFA  Follow-up: 6 weeks

## 2016-02-03 NOTE — Telephone Encounter (Signed)
Sched apt 8/4 at 9. Spoke to pt to inform them.

## 2016-03-08 ENCOUNTER — Encounter: Payer: Self-pay | Admitting: Vascular Surgery

## 2016-03-15 ENCOUNTER — Encounter: Payer: Medicare Other | Admitting: Vascular Surgery

## 2016-03-25 NOTE — Progress Notes (Signed)
    Postoperative Access Visit   History of Present Illness  Travis Carlson is a 70 y.o. year old male who presents for postoperative follow-up for: R 1st BRVT (Date: 01/30/16).  The patient's wounds are healed.  The patient notes no steal symptoms.  The patient is able to complete their activities of daily living.  The patient's current symptoms are: none.  For [facility] Use Only  PRE-ADM LIVING: Home  AMB STATUS: Ambulatory  Physical Examination Vitals:   03/28/16 1300  BP: 110/63  Pulse: 65  Resp: 16  Temp: 97.8 F (36.6 C)    RUE: Incision is healed, skin feels warm, hand grip is 5/5, sensation in digits is intact, palpable thrill, bruit can be auscultated, on Sonosite: fistula >8 mm in perianastomic segment.  In distal 1/3 fistula decreased to 4.5-5.0 mm in diameter  Medical Decision Making  Travis Carlson is a 70 y.o. year old male who presents s/p R 1st BRVT   Recheck fistula in one month.  Suspect fistula will mature adequately with a little more time.  Thank you for allowing [place] to participate in this patient's care.  Adele Barthel, MD, FACS [facility] [place] Office: [phone] Pager: [phone]

## 2016-03-26 ENCOUNTER — Encounter: Payer: Self-pay | Admitting: Vascular Surgery

## 2016-03-28 ENCOUNTER — Encounter: Payer: Self-pay | Admitting: Vascular Surgery

## 2016-03-28 ENCOUNTER — Ambulatory Visit (INDEPENDENT_AMBULATORY_CARE_PROVIDER_SITE_OTHER): Payer: Medicare Other | Admitting: Vascular Surgery

## 2016-03-28 VITALS — BP 110/63 | HR 65 | Temp 97.8°F | Resp 16 | Ht 68.0 in | Wt 163.0 lb

## 2016-03-28 DIAGNOSIS — N186 End stage renal disease: Secondary | ICD-10-CM

## 2016-03-28 DIAGNOSIS — Z992 Dependence on renal dialysis: Secondary | ICD-10-CM

## 2016-04-29 ENCOUNTER — Encounter: Payer: Self-pay | Admitting: Vascular Surgery

## 2016-05-02 NOTE — Progress Notes (Signed)
Postoperative Access Visit   History of Present Illness  Travis Carlson is a 70 y.o. ([birth year]) male who presents for postoperative follow-up for: R 1st BRVT (Date: 01/30/16).  The patient's wounds are healed.  The patient notes no steal symptoms.  The patient is able to complete their activities of daily living.  The patient's current symptoms are: none.  Past Medical History:  Diagnosis Date  . 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 CKD (chronic kidney disease)   . DM2 (diabetes mellitus, type 2) ([place])   . ESRD (end stage renal disease) ([place])   . History of benign colon tumor     Past Surgical History:  Procedure Laterality Date  . AV FISTULA PLACEMENT Right 01/30/2016   Procedure: Brachial vein transposistion first stage, right ;  Surgeon: Conrad [place], MD;  Location: [place];  Service: Vascular;  Laterality: Right;  . COLECTOMY    . HIP ARTHROPLASTY Right 07/14/2014   Procedure: ARTHROPLASTY BIPOLAR HIP;  Surgeon: Rozanna Box, MD;  Location: [place];  Service: Orthopedics;  Laterality: Right;  . THORACIC AORTIC ENDOVASCULAR STENT GRAFT N/A 07/12/2014   Procedure: THORACIC AORTIC ENDOVASCULAR STENT GRAFT;  Surgeon: Serafina Mitchell, MD;  Location: [place];  Service: Vascular;  Laterality: N/A;    Social History   Social History  . Marital status: Single    Spouse name: N/A  . Number of children: N/A  . Years of education: N/A   Occupational History  . Not on file.   Social History Main Topics  . Smoking status: Former Smoker    Types: Cigarettes    Quit date: 03/28/2009  . Smokeless tobacco: Never Used  . Alcohol use No  . Drug use: No  . Sexual activity: Not on file   Other Topics Concern  . Not on file   Social History Narrative  . No narrative on file    History reviewed. No pertinent family history.   Current Outpatient Prescriptions  Medication Sig Dispense Refill  . diazepam (VALIUM) 5 MG tablet Take 1 tablet (5 mg total) by mouth every 8  (eight) hours as needed. 9 tablet 0  . dorzolamide-timolol (COSOPT) 22.3-6.8 MG/ML ophthalmic solution Place 1 drop into the left eye 2 (two) times daily.    . insulin aspart (NOVOLOG) 100 UNIT/ML injection Inject 0-15 Units into the skin 3 (three) times daily before meals. Per sliding scale    . insulin detemir (LEVEMIR) 100 UNIT/ML injection Inject 0.25 mLs (25 Units total) into the skin daily. 10 mL 0  . Multiple Vitamin (MULTIVITAMIN WITH MINERALS) TABS tablet Take 1 tablet by mouth daily.    . multivitamin (RENA-VIT) TABS tablet Take 1 tablet by mouth at bedtime. 30 each 0  . sevelamer carbonate (RENVELA) 800 MG tablet Take 2 tablets (1,600 mg total) by mouth 3 (three) times daily with meals. 30 tablet 0  . traMADol (ULTRAM) 50 MG tablet Take 2 tablets (100 mg total) by mouth every 6 (six) hours. 24 tablet 0   No current facility-administered medications for this visit.      Allergies  Allergen Reactions  . Bactrim [Sulfamethoxazole-Trimethoprim] Other (See Comments)    States muscle weakness for 2 days - unable to stand "it like to have killed me"   . 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 Lisinopril Other (See Comments)    Weakness  renal function abnormalities  .  Metformin Anaphylaxis    GI Upset (intolerance) "it like to have killed me I couldn't get out of my chair for three days"     REVIEW OF SYSTEMS:  (Positives checked otherwise negative)  CARDIOVASCULAR:   [ ]  chest pain,  [ ]  chest pressure,  [ ]  palpitations,  [ ]  shortness of breath when laying flat,  [ ]  shortness of breath with exertion,   [ ]  pain in feet when walking,  [ ]  pain in feet when laying flat, [ ]  history of blood clot in veins (DVT),  [ ]  history of phlebitis,  [ ]  swelling in legs,  [ ]  varicose veins  PULMONARY:   [ ]  productive cough,  [ ]  asthma,  [ ]  wheezing  NEUROLOGIC:   [ ]  weakness in arms or legs,  [ ]  numbness in  arms or legs,  [ ]  difficulty speaking or slurred speech,  [ ]  temporary loss of vision in one eye,  [ ]  dizziness  HEMATOLOGIC:   [ ]  bleeding problems,  [ ]  problems with blood clotting too easily  MUSCULOSKEL:   [ ]  joint pain, [ ]  joint swelling  GASTROINTEST:   [ ]  vomiting blood,  [ ]  blood in stool     GENITOURINARY:   [ ]  burning with urination,  [ ]  blood in urine [x]  end stage renal disease-HD: M/W/F   PSYCHIATRIC:   [ ]  history of major depression  INTEGUMENTARY:   [ ]  rashes,  [ ]  ulcers  CONSTITUTIONAL:   [ ]  fever,  [ ]  chills    For VQI Use Only  PRE-ADM LIVING: Home  AMB STATUS: Ambulatory   Physical Examination  Vitals:   05/03/16 1320  BP: 122/67  Pulse: 63  Resp: 16  Temp: 97.4 F (36.3 C)  TempSrc: Oral  SpO2: 96%  Weight: 165 lb 8 oz (75.1 kg)  Height: 5\' 8"  (1.727 m)   Pulmonary: Sym exp, good air movt, CTAB, no rales, rhonchi, & wheezing  Cardiac: RRR, Nl S1, S2, no Murmurs, rubs or gallops  RUE: Incision is healed, skin feels warm, hand grip is 5/5, sensation in digits is intact, pulsatile fistula, bruit can be auscultated, on Sonosite: fistula 5 distally and collapses shortly after anastomosis   Medical Decision Making  Travis Carlson is a 70 y.o. (12-20-1945) male who presents s/p R 1st BRVT   Fistula appears to be thrombosing, so would go ahead and place a RUA AVG, ligating the fistula if still patent at the time of the AVG Risk, benefits, and alternatives to access surgery were discussed.   The patient is aware the risks include but are not limited to: bleeding, infection, steal syndrome, nerve damage, ischemic monomelic neuropathy, failure to mature, need for additional procedures, death and stroke.  The patient agrees to proceed forward with the procedure.  Will schedule 26 SEP 17   Adele Barthel, MD, [facility] [place] Office: [phone] Pager: [phone]

## 2016-05-03 ENCOUNTER — Encounter: Payer: Self-pay | Admitting: Vascular Surgery

## 2016-05-03 ENCOUNTER — Other Ambulatory Visit: Payer: Self-pay

## 2016-05-03 ENCOUNTER — Ambulatory Visit (INDEPENDENT_AMBULATORY_CARE_PROVIDER_SITE_OTHER): Payer: Medicare Other | Admitting: Vascular Surgery

## 2016-05-03 VITALS — BP 122/67 | HR 63 | Temp 97.4°F | Resp 16 | Ht 68.0 in | Wt 165.5 lb

## 2016-05-03 DIAGNOSIS — Z992 Dependence on renal dialysis: Secondary | ICD-10-CM

## 2016-05-03 DIAGNOSIS — N186 End stage renal disease: Secondary | ICD-10-CM

## 2016-05-20 ENCOUNTER — Encounter (HOSPITAL_COMMUNITY): Payer: Self-pay | Admitting: *Deleted

## 2016-05-20 NOTE — Progress Notes (Signed)
I instructed patient to check CBG to check CBG and if it is less than 70 to treat it with Glucose Gel, Glucose tablets or 1/2 cup of clear juice like apple juice or cranberry juice, or 1/2 cup of regular soda. (not cream soda). I instructed patient to recheck CBG in 15 minutes and if CBG is not greater than 70, to  Call [phone] (pre- op). If it is before pre-op opens to retreat as before and recheck CBG in 15 minutes. I told patient to make note of time that liquid is taken and amount, that surgical time may have to be adjusted.   I instructed patient to take 1/2 of Levemir Insulin in am- 15 units and if CBG > 220 to take 1/2 of SS Insulin.

## 2016-05-20 NOTE — Anesthesia Preprocedure Evaluation (Addendum)
Anesthesia Evaluation  Patient identified by MRN, date of birth, ID band Patient awake    Reviewed: Allergy & Precautions, H&P , NPO status , Patient's Chart, lab work & pertinent test results  History of Anesthesia Complications Negative for: history of anesthetic complications  Airway Mallampati: II  TM Distance: >3 FB Neck ROM: full    Dental no notable dental hx.    Pulmonary former smoker,    Pulmonary exam normal breath sounds clear to auscultation       Cardiovascular (-) hypertension(-) angina+ CAD, + Past MI, + Cardiac Stents and + Peripheral Vascular Disease (s/p thoracic aneurysm rupture, now with stent graft)  Normal cardiovascular exam Rhythm:regular Rate:Normal  EF preserved at 65%   Neuro/Psych negative neurological ROS  negative psych ROS   GI/Hepatic negative GI ROS, Neg liver ROS,   Endo/Other  diabetes, Poorly Controlled, Type 2, Insulin Dependent  Renal/GU Dialysis and ESRFRenal disease     Musculoskeletal   Abdominal   Peds  Hematology negative hematology ROS (+)   Anesthesia Other Findings   Reproductive/Obstetrics negative OB ROS                            Anesthesia Physical  Anesthesia Plan  ASA: III  Anesthesia Plan: General   Post-op Pain Management:    Induction: Intravenous  Airway Management Planned: LMA  Additional Equipment:   Intra-op Plan:   Post-operative Plan: Extubation in OR  Informed Consent: I have reviewed the patients History and Physical, chart, labs and discussed the procedure including the risks, benefits and alternatives for the proposed anesthesia with the patient or authorized representative who has indicated his/her understanding and acceptance.   Dental Advisory Given  Plan Discussed with: Anesthesiologist, CRNA and Surgeon  Anesthesia Plan Comments: (Risks/benefits of general anesthesia discussed with patient including  risk of damage to teeth, lips, gum, and tongue, nausea/vomiting, allergic reactions to medications, and the possibility of heart attack, stroke and death.  All patient questions answered.  Patient wishes to proceed.)        Anesthesia Quick Evaluation

## 2016-05-21 ENCOUNTER — Encounter (HOSPITAL_COMMUNITY): Payer: Self-pay | Admitting: Certified Registered"

## 2016-05-21 ENCOUNTER — Ambulatory Visit (HOSPITAL_COMMUNITY): Payer: Medicare Other | Admitting: Emergency Medicine

## 2016-05-21 ENCOUNTER — Telehealth: Payer: Self-pay | Admitting: Vascular Surgery

## 2016-05-21 ENCOUNTER — Encounter (HOSPITAL_COMMUNITY)
Admission: RE | Disposition: A | Discharge status: Home / Self Care | Payer: Self-pay | Source: Ambulatory Visit | Attending: Vascular Surgery

## 2016-05-21 ENCOUNTER — Ambulatory Visit (HOSPITAL_COMMUNITY)
Admission: RE | Admit: 2016-05-21 | Discharge: 2016-05-21 | Disposition: A | Discharge status: Home / Self Care | Payer: Medicare Other | Source: Ambulatory Visit | Attending: Vascular Surgery | Admitting: Vascular Surgery

## 2016-05-21 DIAGNOSIS — Z79899 Other long term (current) drug therapy: Secondary | ICD-10-CM | POA: Insufficient documentation

## 2016-05-21 DIAGNOSIS — I252 Old myocardial infarction: Secondary | ICD-10-CM | POA: Diagnosis not present

## 2016-05-21 DIAGNOSIS — H5461 Unqualified visual loss, right eye, normal vision left eye: Secondary | ICD-10-CM | POA: Insufficient documentation

## 2016-05-21 DIAGNOSIS — I2510 Atherosclerotic heart disease of native coronary artery without angina pectoris: Secondary | ICD-10-CM | POA: Diagnosis not present

## 2016-05-21 DIAGNOSIS — E1122 Type 2 diabetes mellitus with diabetic chronic kidney disease: Secondary | ICD-10-CM | POA: Diagnosis not present

## 2016-05-21 DIAGNOSIS — Y832 Surgical operation with anastomosis, bypass or graft as the cause of abnormal reaction of the patient, or of later complication, without mention of misadventure at the time of the procedure: Secondary | ICD-10-CM | POA: Insufficient documentation

## 2016-05-21 DIAGNOSIS — Z992 Dependence on renal dialysis: Secondary | ICD-10-CM | POA: Diagnosis not present

## 2016-05-21 DIAGNOSIS — Z794 Long term (current) use of insulin: Secondary | ICD-10-CM | POA: Insufficient documentation

## 2016-05-21 DIAGNOSIS — E1165 Type 2 diabetes mellitus with hyperglycemia: Secondary | ICD-10-CM | POA: Diagnosis not present

## 2016-05-21 DIAGNOSIS — N186 End stage renal disease: Secondary | ICD-10-CM | POA: Insufficient documentation

## 2016-05-21 DIAGNOSIS — E1151 Type 2 diabetes mellitus with diabetic peripheral angiopathy without gangrene: Secondary | ICD-10-CM | POA: Insufficient documentation

## 2016-05-21 DIAGNOSIS — T82868A Thrombosis of vascular prosthetic devices, implants and grafts, initial encounter: Secondary | ICD-10-CM | POA: Diagnosis not present

## 2016-05-21 DIAGNOSIS — Z87891 Personal history of nicotine dependence: Secondary | ICD-10-CM | POA: Insufficient documentation

## 2016-05-21 HISTORY — PX: AV FISTULA PLACEMENT: SHX1204

## 2016-05-21 HISTORY — DX: Unspecified osteoarthritis, unspecified site: M19.90

## 2016-05-21 HISTORY — DX: Malignant (primary) neoplasm, unspecified: C80.1

## 2016-05-21 HISTORY — PX: LIGATION OF ARTERIOVENOUS  FISTULA: SHX5948

## 2016-05-21 LAB — GLUCOSE, CAPILLARY
Glucose-Capillary: 117 mg/dL — ABNORMAL HIGH (ref 65–99)
Glucose-Capillary: 93 mg/dL (ref 65–99)

## 2016-05-21 LAB — POCT I-STAT 4, (NA,K, GLUC, HGB,HCT)
GLUCOSE: 114 mg/dL — AB (ref 65–99)
HCT: 34 % — ABNORMAL LOW (ref 39.0–52.0)
Hemoglobin: 11.6 g/dL — ABNORMAL LOW (ref 13.0–17.0)
POTASSIUM: 4.7 mmol/L (ref 3.5–5.1)
SODIUM: 137 mmol/L (ref 135–145)

## 2016-05-21 SURGERY — INSERTION OF ARTERIOVENOUS (AV) GORE-TEX GRAFT ARM
Anesthesia: General | Site: Arm Upper | Laterality: Right

## 2016-05-21 MED ORDER — HEPARIN SODIUM (PORCINE) 1000 UNIT/ML IJ SOLN
INTRAMUSCULAR | Status: DC | PRN
  Administered 2016-05-21: 5000 [IU] via INTRAVENOUS

## 2016-05-21 MED ORDER — DEXAMETHASONE SODIUM PHOSPHATE 10 MG/ML IJ SOLN
INTRAMUSCULAR | Status: AC
  Filled 2016-05-21: qty 1

## 2016-05-21 MED ORDER — SODIUM CHLORIDE 0.9 % IV SOLN: INTRAVENOUS | Status: DC

## 2016-05-21 MED ORDER — HYDROCODONE-ACETAMINOPHEN 5-325 MG PO TABS: 1.0000 | ORAL_TABLET | Freq: Four times a day (QID) | ORAL | 0 refills | Status: DC | PRN

## 2016-05-21 MED ORDER — ONDANSETRON HCL 4 MG/2ML IJ SOLN
4.0000 mg | Freq: Once | INTRAMUSCULAR | Status: AC | PRN
  Administered 2016-05-21: 4 mg via INTRAVENOUS

## 2016-05-21 MED ORDER — ONDANSETRON HCL 4 MG/2ML IJ SOLN
INTRAMUSCULAR | Status: DC | PRN
  Administered 2016-05-21: 4 mg via INTRAVENOUS

## 2016-05-21 MED ORDER — ONDANSETRON HCL 4 MG/2ML IJ SOLN
INTRAMUSCULAR | Status: AC
  Filled 2016-05-21: qty 2

## 2016-05-21 MED ORDER — 0.9 % SODIUM CHLORIDE (POUR BTL) OPTIME
TOPICAL | Status: DC | PRN
  Administered 2016-05-21: 1000 mL

## 2016-05-21 MED ORDER — SODIUM CHLORIDE 0.9 % IV SOLN
INTRAVENOUS | Status: DC | PRN
  Administered 2016-05-21: 08:00:00

## 2016-05-21 MED ORDER — EPHEDRINE SULFATE 50 MG/ML IJ SOLN
INTRAMUSCULAR | Status: DC | PRN
  Administered 2016-05-21: 10 mg via INTRAVENOUS

## 2016-05-21 MED ORDER — LIDOCAINE HCL (CARDIAC) 20 MG/ML IV SOLN
INTRAVENOUS | Status: DC | PRN
  Administered 2016-05-21: 100 mg via INTRAVENOUS

## 2016-05-21 MED ORDER — CHLORHEXIDINE GLUCONATE CLOTH 2 % EX PADS: 6.0000 | MEDICATED_PAD | Freq: Once | CUTANEOUS | Status: DC

## 2016-05-21 MED ORDER — FENTANYL CITRATE (PF) 100 MCG/2ML IJ SOLN
25.0000 ug | INTRAMUSCULAR | Status: DC | PRN
  Administered 2016-05-21: 25 ug via INTRAVENOUS

## 2016-05-21 MED ORDER — FENTANYL CITRATE (PF) 100 MCG/2ML IJ SOLN
INTRAMUSCULAR | Status: DC | PRN
  Administered 2016-05-21 (×2): 25 ug via INTRAVENOUS
  Administered 2016-05-21: 50 ug via INTRAVENOUS

## 2016-05-21 MED ORDER — INSULIN DETEMIR 100 UNIT/ML ~~LOC~~ SOLN: 30.0000 [IU] | Freq: Every day | SUBCUTANEOUS | Status: DC

## 2016-05-21 MED ORDER — LIDOCAINE HCL (PF) 1 % IJ SOLN
INTRAMUSCULAR | Status: AC
  Filled 2016-05-21: qty 30

## 2016-05-21 MED ORDER — PROTAMINE SULFATE 10 MG/ML IV SOLN
INTRAVENOUS | Status: DC | PRN
  Administered 2016-05-21: 10 mg via INTRAVENOUS

## 2016-05-21 MED ORDER — SODIUM CHLORIDE 0.9 % IV SOLN
INTRAVENOUS | Status: DC
  Filled 2016-05-21: qty 20

## 2016-05-21 MED ORDER — FENTANYL CITRATE (PF) 100 MCG/2ML IJ SOLN
INTRAMUSCULAR | Status: AC
  Filled 2016-05-21: qty 2

## 2016-05-21 MED ORDER — LIDOCAINE HCL (PF) 1 % IJ SOLN
INTRAMUSCULAR | Status: DC | PRN
  Administered 2016-05-21: 30 mL

## 2016-05-21 MED ORDER — CEFUROXIME SODIUM 1.5 G IJ SOLR
1.5000 g | INTRAMUSCULAR | Status: AC
  Administered 2016-05-21: 1.5 g via INTRAVENOUS
  Filled 2016-05-21: qty 1.5

## 2016-05-21 MED ORDER — HEPARIN SODIUM (PORCINE) 1000 UNIT/ML IJ SOLN
INTRAMUSCULAR | Status: AC
  Filled 2016-05-21: qty 1

## 2016-05-21 MED ORDER — PHENYLEPHRINE HCL 10 MG/ML IJ SOLN
INTRAVENOUS | Status: DC | PRN
  Administered 2016-05-21: 30 ug/min via INTRAVENOUS

## 2016-05-21 MED ORDER — PROPOFOL 10 MG/ML IV BOLUS
INTRAVENOUS | Status: DC | PRN
  Administered 2016-05-21: 70 mg via INTRAVENOUS

## 2016-05-21 MED ORDER — SODIUM CHLORIDE 0.9 % IV SOLN
INTRAVENOUS | Status: DC | PRN
  Administered 2016-05-21 (×2): via INTRAVENOUS

## 2016-05-21 MED ORDER — PHENYLEPHRINE HCL 10 MG/ML IJ SOLN
INTRAMUSCULAR | Status: DC | PRN
  Administered 2016-05-21 (×4): 80 ug via INTRAVENOUS

## 2016-05-21 MED ORDER — HEMOSTATIC AGENTS (NO CHARGE) OPTIME
TOPICAL | Status: DC | PRN
  Administered 2016-05-21: 1 via TOPICAL

## 2016-05-21 MED ORDER — GLYCOPYRROLATE 0.2 MG/ML IJ SOLN
INTRAMUSCULAR | Status: DC | PRN
  Administered 2016-05-21 (×2): 0.2 mg via INTRAVENOUS

## 2016-05-21 MED ORDER — PROPOFOL 10 MG/ML IV BOLUS
INTRAVENOUS | Status: AC
  Filled 2016-05-21: qty 20

## 2016-05-21 SURGICAL SUPPLY — 52 items
ARMBAND PINK RESTRICT EXTREMIT (MISCELLANEOUS) ×6 IMPLANT
CANISTER SUCTION 2500CC (MISCELLANEOUS) ×3 IMPLANT
CLIP TI MEDIUM 6 (CLIP) ×3 IMPLANT
CLIP TI WIDE RED SMALL 6 (CLIP) ×6 IMPLANT
COVER PROBE W GEL 5X96 (DRAPES) ×3 IMPLANT
DECANTER SPIKE VIAL GLASS SM (MISCELLANEOUS) ×3 IMPLANT
DERMABOND ADVANCED (GAUZE/BANDAGES/DRESSINGS) ×2
DERMABOND ADVANCED .7 DNX12 (GAUZE/BANDAGES/DRESSINGS) ×1 IMPLANT
ELECT REM PT RETURN 9FT ADLT (ELECTROSURGICAL) ×3
ELECTRODE REM PT RTRN 9FT ADLT (ELECTROSURGICAL) ×1 IMPLANT
GAUZE SPONGE 4X4 12PLY STRL (GAUZE/BANDAGES/DRESSINGS) ×3 IMPLANT
GEL ULTRASOUND 20GR AQUASONIC (MISCELLANEOUS) IMPLANT
GLOVE BIO SURGEON STRL SZ 6.5 (GLOVE) ×2 IMPLANT
GLOVE BIO SURGEON STRL SZ7 (GLOVE) ×3 IMPLANT
GLOVE BIO SURGEON STRL SZ7.5 (GLOVE) ×3 IMPLANT
GLOVE BIO SURGEONS STRL SZ 6.5 (GLOVE) ×1
GLOVE BIOGEL PI IND STRL 6.5 (GLOVE) ×3 IMPLANT
GLOVE BIOGEL PI IND STRL 7.0 (GLOVE) ×2 IMPLANT
GLOVE BIOGEL PI IND STRL 7.5 (GLOVE) ×1 IMPLANT
GLOVE BIOGEL PI IND STRL 8 (GLOVE) ×1 IMPLANT
GLOVE BIOGEL PI INDICATOR 6.5 (GLOVE) ×6
GLOVE BIOGEL PI INDICATOR 7.0 (GLOVE) ×4
GLOVE BIOGEL PI INDICATOR 7.5 (GLOVE) ×2
GLOVE BIOGEL PI INDICATOR 8 (GLOVE) ×2
GLOVE SURG SS PI 6.5 STRL IVOR (GLOVE) ×6 IMPLANT
GOWN STRL REUS W/ TWL LRG LVL3 (GOWN DISPOSABLE) ×4 IMPLANT
GOWN STRL REUS W/ TWL XL LVL3 (GOWN DISPOSABLE) ×1 IMPLANT
GOWN STRL REUS W/TWL LRG LVL3 (GOWN DISPOSABLE) ×8
GOWN STRL REUS W/TWL XL LVL3 (GOWN DISPOSABLE) ×2
GRAFT GORETEX STRT 4-7X45 (Vascular Products) ×3 IMPLANT
HEMOSTAT SPONGE AVITENE ULTRA (HEMOSTASIS) ×3 IMPLANT
KIT BASIN OR (CUSTOM PROCEDURE TRAY) ×3 IMPLANT
KIT ROOM TURNOVER OR (KITS) ×3 IMPLANT
LIQUID BAND (GAUZE/BANDAGES/DRESSINGS) ×3 IMPLANT
LOOP VESSEL MAXI BLUE (MISCELLANEOUS) ×3 IMPLANT
LOOP VESSEL MINI RED (MISCELLANEOUS) ×3 IMPLANT
NS IRRIG 1000ML POUR BTL (IV SOLUTION) ×3 IMPLANT
PACK CV ACCESS (CUSTOM PROCEDURE TRAY) ×3 IMPLANT
PAD ARMBOARD 7.5X6 YLW CONV (MISCELLANEOUS) ×6 IMPLANT
SUT ETHILON 3 0 PS 1 (SUTURE) IMPLANT
SUT MNCRL AB 4-0 PS2 18 (SUTURE) ×9 IMPLANT
SUT PROLENE 5 0 C 1 24 (SUTURE) IMPLANT
SUT PROLENE 6 0 BV (SUTURE) ×6 IMPLANT
SUT PROLENE 7 0 BV 1 (SUTURE) ×6 IMPLANT
SUT SILK 0 TIES 10X30 (SUTURE) ×3 IMPLANT
SUT SILK 2 0 FS (SUTURE) ×3 IMPLANT
SUT VIC AB 3-0 SH 27 (SUTURE) ×4
SUT VIC AB 3-0 SH 27X BRD (SUTURE) ×2 IMPLANT
SWAB COLLECTION DEVICE MRSA (MISCELLANEOUS) IMPLANT
TUBE ANAEROBIC SPECIMEN COL (MISCELLANEOUS) IMPLANT
UNDERPAD 30X30 (UNDERPADS AND DIAPERS) ×3 IMPLANT
WATER STERILE IRR 1000ML POUR (IV SOLUTION) ×3 IMPLANT

## 2016-05-21 NOTE — Anesthesia Procedure Notes (Signed)
Procedure Name: LMA Insertion Date/Time: 05/21/2016 7:37 AM Performed by: Myna Bright Pre-anesthesia Checklist: Patient identified, Emergency Drugs available, Suction available and Patient being monitored Patient Re-evaluated:Patient Re-evaluated prior to inductionOxygen Delivery Method: Circle system utilized Preoxygenation: Pre-oxygenation with 100% oxygen Intubation Type: IV induction Ventilation: Mask ventilation without difficulty LMA: LMA inserted LMA Size: 5.0 Tube type: Oral Number of attempts: 1 Placement Confirmation: positive ETCO2 and breath sounds checked- equal and bilateral Tube secured with: Tape Dental Injury: Teeth and Oropharynx as per pre-operative assessment

## 2016-05-21 NOTE — Telephone Encounter (Signed)
-----   Message from Denman George, RN sent at 05/21/2016 11:27 AM EDT ----- Regarding: needs 4 week f/u with Dr. Bridgett Larsson   ----- Message ----- From: Alvia Grove, PA-C Sent: 05/21/2016  10:00 AM To: Vvs Charge Pool  S/p ligation right brachial vein transposition, insertion of right upper arm AV graft 05/21/16  F/u with Dr. Bridgett Larsson in 4 weeks. No studies.  Thanks Maudie Mercury

## 2016-05-21 NOTE — H&P (View-Only) (Signed)
Postoperative Access Visit   History of Present Illness  Travis Carlson is a 70 y.o. (17-Jan-1946) male who presents for postoperative follow-up for: R 1st BRVT (Date: 01/30/16).  The patient's wounds are healed.  The patient notes no steal symptoms.  The patient is able to complete their activities of daily living.  The patient's current symptoms are: none.  Past Medical History:  Diagnosis Date  . 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 CKD (chronic kidney disease)   . DM2 (diabetes mellitus, type 2) ([place])   . ESRD (end stage renal disease) ([place])   . History of benign colon tumor     Past Surgical History:  Procedure Laterality Date  . AV FISTULA PLACEMENT Right 01/30/2016   Procedure: Brachial vein transposistion first stage, right ;  Surgeon: Conrad Mystic, MD;  Location: [place];  Service: Vascular;  Laterality: Right;  . COLECTOMY    . HIP ARTHROPLASTY Right 07/14/2014   Procedure: ARTHROPLASTY BIPOLAR HIP;  Surgeon: Rozanna Box, MD;  Location: [place];  Service: Orthopedics;  Laterality: Right;  . THORACIC AORTIC ENDOVASCULAR STENT GRAFT N/A 07/12/2014   Procedure: THORACIC AORTIC ENDOVASCULAR STENT GRAFT;  Surgeon: Serafina Mitchell, MD;  Location: [place];  Service: Vascular;  Laterality: N/A;    Social History   Social History  . Marital status: Single    Spouse name: N/A  . Number of children: N/A  . Years of education: N/A   Occupational History  . Not on file.   Social History Main Topics  . Smoking status: Former Smoker    Types: Cigarettes    Quit date: 03/28/2009  . Smokeless tobacco: Never Used  . Alcohol use No  . Drug use: No  . Sexual activity: Not on file   Other Topics Concern  . Not on file   Social History Narrative  . No narrative on file    History reviewed. No pertinent family history.   Current Outpatient Prescriptions  Medication Sig Dispense Refill  . diazepam (VALIUM) 5 MG tablet Take 1 tablet (5 mg total) by mouth every 8  (eight) hours as needed. 9 tablet 0  . dorzolamide-timolol (COSOPT) 22.3-6.8 MG/ML ophthalmic solution Place 1 drop into the left eye 2 (two) times daily.    . insulin aspart (NOVOLOG) 100 UNIT/ML injection Inject 0-15 Units into the skin 3 (three) times daily before meals. Per sliding scale    . insulin detemir (LEVEMIR) 100 UNIT/ML injection Inject 0.25 mLs (25 Units total) into the skin daily. 10 mL 0  . Multiple Vitamin (MULTIVITAMIN WITH MINERALS) TABS tablet Take 1 tablet by mouth daily.    . multivitamin (RENA-VIT) TABS tablet Take 1 tablet by mouth at bedtime. 30 each 0  . sevelamer carbonate (RENVELA) 800 MG tablet Take 2 tablets (1,600 mg total) by mouth 3 (three) times daily with meals. 30 tablet 0  . traMADol (ULTRAM) 50 MG tablet Take 2 tablets (100 mg total) by mouth every 6 (six) hours. 24 tablet 0   No current facility-administered medications for this visit.      Allergies  Allergen Reactions  . Bactrim [Sulfamethoxazole-Trimethoprim] Other (See Comments)    States muscle weakness for 2 days - unable to stand "it like to have killed me"   . 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 Lisinopril Other (See Comments)    Weakness  renal function abnormalities  .  Metformin Anaphylaxis    GI Upset (intolerance) "it like to have killed me I couldn't get out of my chair for three days"     REVIEW OF SYSTEMS:  (Positives checked otherwise negative)  CARDIOVASCULAR:   [ ]  chest pain,  [ ]  chest pressure,  [ ]  palpitations,  [ ]  shortness of breath when laying flat,  [ ]  shortness of breath with exertion,   [ ]  pain in feet when walking,  [ ]  pain in feet when laying flat, [ ]  history of blood clot in veins (DVT),  [ ]  history of phlebitis,  [ ]  swelling in legs,  [ ]  varicose veins  PULMONARY:   [ ]  productive cough,  [ ]  asthma,  [ ]  wheezing  NEUROLOGIC:   [ ]  weakness in arms or legs,  [ ]  numbness in  arms or legs,  [ ]  difficulty speaking or slurred speech,  [ ]  temporary loss of vision in one eye,  [ ]  dizziness  HEMATOLOGIC:   [ ]  bleeding problems,  [ ]  problems with blood clotting too easily  MUSCULOSKEL:   [ ]  joint pain, [ ]  joint swelling  GASTROINTEST:   [ ]  vomiting blood,  [ ]  blood in stool     GENITOURINARY:   [ ]  burning with urination,  [ ]  blood in urine [x]  end stage renal disease-HD: M/W/F   PSYCHIATRIC:   [ ]  history of major depression  INTEGUMENTARY:   [ ]  rashes,  [ ]  ulcers  CONSTITUTIONAL:   [ ]  fever,  [ ]  chills    For VQI Use Only  PRE-ADM LIVING: Home  AMB STATUS: Ambulatory   Physical Examination  Vitals:   05/03/16 1320  BP: 122/67  Pulse: 63  Resp: 16  Temp: 97.4 F (36.3 C)  TempSrc: Oral  SpO2: 96%  Weight: 165 lb 8 oz (75.1 kg)  Height: 5\' 8"  (1.727 m)   Pulmonary: Sym exp, good air movt, CTAB, no rales, rhonchi, & wheezing  Cardiac: RRR, Nl S1, S2, no Murmurs, rubs or gallops  RUE: Incision is healed, skin feels warm, hand grip is 5/5, sensation in digits is intact, pulsatile fistula, bruit can be auscultated, on Sonosite: fistula 5 distally and collapses shortly after anastomosis   Medical Decision Making  Travis Carlson is a 70 y.o. (January 13, 1946) male who presents s/p R 1st BRVT   Fistula appears to be thrombosing, so would go ahead and place a RUA AVG, ligating the fistula if still patent at the time of the AVG Risk, benefits, and alternatives to access surgery were discussed.   The patient is aware the risks include but are not limited to: bleeding, infection, steal syndrome, nerve damage, ischemic monomelic neuropathy, failure to mature, need for additional procedures, death and stroke.  The patient agrees to proceed forward with the procedure.  Will schedule 26 SEP 17   Adele Barthel, MD, [facility] [place] Office: [phone] Pager: [phone]

## 2016-05-21 NOTE — Discharge Instructions (Signed)
° ° °  05/21/2016 Travis Carlson PO:[id number] 13-Aug-1945  Surgeon(s): Conrad Stonewall, MD  Procedure(s): INSERTION OF 4-7MM X 45CM ARTERIOVENOUS (AV) GORE-TEX GRAFT ARM RIGHT UPPER ARM LIGATION OF BRACHIAL VEIN TRANSPOSITION UPPER ARM  x Do not stick graft for 4 weeks

## 2016-05-21 NOTE — Telephone Encounter (Signed)
Sorry hit sign encounter before I added a message   LVM on pts home number will mail letter for appt on 06/21/16

## 2016-05-21 NOTE — Transfer of Care (Signed)
Immediate Anesthesia Transfer of Care Note  Patient: Travis Carlson  Procedure(s) Performed: Procedure(s): INSERTION OF 4-7MM X 45CM ARTERIOVENOUS (AV) GORE-TEX GRAFT ARM RIGHT UPPER ARM (Right) LIGATION OF BRACHIAL VEIN TRANSPOSITION UPPER ARM (Right)  Patient Location: PACU  Anesthesia Type:General  Level of Consciousness: awake, alert , oriented and patient cooperative  Airway & Oxygen Therapy: Patient Spontanous Breathing and Patient connected to nasal cannula oxygen  Post-op Assessment: Report given to RN, Post -op Vital signs reviewed and stable and Patient moving all extremities  Post vital signs: Reviewed and stable  Last Vitals:  Vitals:   05/21/16 0605 05/21/16 1008  BP: (!) 157/55 (!) 152/67  Pulse: 64 89  Resp: 16 15  Temp: 36.7 C     Last Pain:  Vitals:   05/21/16 0605  TempSrc: Oral         Complications: No apparent anesthesia complications

## 2016-05-21 NOTE — Interval H&P Note (Signed)
History and Physical Interval Note:  05/21/2016 7:10 AM  Griselda Miner  has presented today for surgery, with the diagnosis of End Stage Renal Disease N18.6  The various methods of treatment have been discussed with the patient and family. After consideration of risks, benefits and other options for treatment, the patient has consented to  Procedure(s): INSERTION OF ARTERIOVENOUS (AV) GORE-TEX GRAFT ARM (Right) LIGATION OF BRACHIAL VEIN TRANSPOSITION (Right) as a surgical intervention .  The patient's history has been reviewed, patient examined, no change in status, stable for surgery.  I have reviewed the patient's chart and labs.  Questions were answered to the patient's satisfaction.     Adele Barthel

## 2016-05-21 NOTE — Op Note (Signed)
OPERATIVE NOTE    PROCEDURE: 1. Ligation of right brachial vein transposition  2. Right upper arm graft (4 x 7 stretch PTFE)  PRE-OPERATIVE DIAGNOSIS: thrombosing right brachial vein transposition   POST-OPERATIVE DIAGNOSIS: same as above   SURGEON: Adele Barthel, MD  ASSISTANT(S): Silva Bandy, PAC   ANESTHESIA: general  ESTIMATED BLOOD LOSS: 50 cc  FINDING(S): 1.  Faintly palpable radial pulse  2.  Palpable thrill in upper arm  SPECIMEN(S):  none  INDICATIONS:   Travis Carlson is a 70 y.o. male who presents with thrombosing right brachial vein transposition.  I recommended: right upper arteriovenous graft placement and ligating the right brachial vein transposition if it was still functioning at the graft placement.  Risk, benefits, and alternatives to access surgery were discussed.  The patient is aware the risks include but are not limited to: bleeding, infection, steal syndrome, nerve damage, ischemic monomelic neuropathy, failure to mature, need for additional procedures, death and stroke.  The patient agrees to proceed forward with the procedure.   DESCRIPTION: After obtaining full informed written consent, the patient was brought back to the operating room and placed supine upon the operating table.  The patient received IV antibiotics prior to induction.  After obtaining adequate anesthesia, the patient was prepped and draped in the standard fashion for: right arm access.  I identified the brachial vein transposition distally and marked the distal fistula on the skin.  I also marked out the location of the brachial vein proximally in the high brachial groove.  I injected 10 cc 1% lidocaine without epinephrine at the distal exposure and the proximal vein exposure.    I made an distal incision over the brachial vein transposition.  I dissected out the fistula with electrocautery.  There was no thrill in this segment of the fistula but there was a pulse in the fistula.  I  dissected out the brachial artery segment deep to this fistula.  I packed a wet gauze in this incision.  I turned my attention to the high brachial groove.  I made an incision over the brachial vein and dissected down to the vein with electrocautery.  The vein appeared to 6 mm in diameter, so I felt this segment was acceptable for use as outflow.    I dissected bluntly the start of the subcutaneous tunnel in both incisions.  I dissected from the distal incision to the proximal incision with a curvilinear tunneler in the subcutaneous tissue superficial to the fascia.  I gave the patient 5000 units of Heparin to obtain some anticoagulation.  I obtained a 4,7 mm stretch goretex graft and stretched it to length.  I passed it through the metal tunnel, leaving the 4 mm end distal.  I removed the metal tunnel.    I waited 3 minutes and then placed the brachial artery under tension proximally and distally.  I spatulated the 4 mm end of the graft.  I made an arteriotomy with a 11-blade and extended this arteriotomy with a Potts scissor.  I sewed the graft to the artery with a running stitch of 7-0 Prolene in an end-to-side configuration.  Prior to completing this, I backbled both ends of the artery.  There was no clot visualized.  I completed this anastomosis in the usual fashion.  I clamped the distal end of the graft and released all vessel loops.  There was good pulse in the graft.  I moved the clamp to just distal to the anastomosis.  I  washed out the graft with Heparinized saline.  At this point, I turned my attention to the high brachial groove.  The lay of the 7 mm end of the graft required an end-to-side configuration.  I clamped the high brachial vein proximally and distally.  I made an venotomy with a 11-blade and extended it proximally and distally.  I sewed the graft to the vein with a running stitch of 6-0 Prolene in an end-to-side configuration.  Prior to completion, I backbled the vein and the graft.   There was no clot and good bleeding from the graft.  I completed this anastomosis in the usual fashion.  After releasing all clamps, there was a good thrill in the vein and distally there was faintly palpable radial pulse.  I washed out all incisions and packed them with Avitene.  I gave the patient 30 mg of Protamine to reverse anticoagulation.   After waiting a few minutes, the remaining bleeding points were controlled with electrocautery.  I reapproximated the subcutaneous tissue in both incisions with a running stitch of 3-0 Vicryl.  The skin at both incisions was then reapproximated with a running stitch of 4-0 Monocryl in a subcuticular fashion.  The skin at both incisions was cleaned, dried, and reinforced with Dermabond.   COMPLICATIONS: none  CONDITION: stable   Adele Barthel, MD [facility] [place] Office: [phone] Pager: [phone]  05/21/2016, 9:49 AM

## 2016-05-22 ENCOUNTER — Encounter (HOSPITAL_COMMUNITY): Payer: Self-pay | Admitting: Vascular Surgery

## 2016-05-23 NOTE — Anesthesia Postprocedure Evaluation (Signed)
Anesthesia Post Note  Patient: Travis Carlson  Procedure(s) Performed: Procedure(s) (LRB): INSERTION OF 4-7MM X 45CM ARTERIOVENOUS (AV) GORE-TEX GRAFT ARM RIGHT UPPER ARM (Right) LIGATION OF BRACHIAL VEIN TRANSPOSITION UPPER ARM (Right)  Patient location during evaluation: PACU Anesthesia Type: General Level of consciousness: awake and alert Pain management: pain level controlled Vital Signs Assessment: post-procedure vital signs reviewed and stable Respiratory status: spontaneous breathing, nonlabored ventilation, respiratory function stable and patient connected to nasal cannula oxygen Cardiovascular status: blood pressure returned to baseline and stable Postop Assessment: no signs of nausea or vomiting Anesthetic complications: no    Last Vitals:  Vitals:   05/21/16 1115 05/21/16 1133  BP: (!) 139/57 (!) 137/57  Pulse: 73 78  Resp: 12 18  Temp: 36.7 C     Last Pain:  Vitals:   05/21/16 1133  TempSrc:   PainSc: 0-No pain                 Catalina Gravel

## 2016-06-21 ENCOUNTER — Encounter: Payer: Medicare Other | Admitting: Vascular Surgery

## 2016-06-28 ENCOUNTER — Encounter: Payer: Medicare Other | Admitting: Vascular Surgery

## 2016-07-10 ENCOUNTER — Encounter: Payer: Self-pay | Admitting: Vascular Surgery

## 2016-07-11 NOTE — Progress Notes (Signed)
    Postoperative Access Visit   History of Present Illness  Travis Carlson is a 70 y.o. year old male who presents for postoperative follow-up for: ligation of R BRVT, RUA AVG (Date: 05/21/16).  The patient's wounds are healed.  The patient notes no steal symptoms.  The patient is able to complete their activities of daily living.  The patient's current symptoms are: none.  For VQI Use Only  PRE-ADM LIVING: Home  AMB STATUS: Ambulatory  Physical Examination Vitals:   07/12/16 1338  BP: 106/64  Pulse: 62  Resp: 18  Temp: 97 F (36.1 C)    RUE: Incision are healed, skin feels warm, hand grip is 5/5, sensation in digits is intact, palpable thrill, bruit can be auscultated, palpable radial pulse  Medical Decision Making  Travis Carlson is a 70 y.o. year old male who presents s/p ligation of R BRVT, RUA AVG.   The patient's access is ready for use.  The patient's tunneled dialysis catheter can be removed after two successful cannulations and completed dialysis treatments.  Thank you for allowing [place] to participate in this patient's care.  Adele Barthel, MD, FACS [facility] Office: [phone] Pager: [phone]

## 2016-07-12 ENCOUNTER — Ambulatory Visit (INDEPENDENT_AMBULATORY_CARE_PROVIDER_SITE_OTHER): Payer: Medicare Other | Admitting: Vascular Surgery

## 2016-07-12 ENCOUNTER — Encounter: Payer: Self-pay | Admitting: Vascular Surgery

## 2016-07-12 VITALS — BP 106/64 | HR 62 | Temp 97.0°F | Resp 18 | Ht 69.0 in | Wt 158.0 lb

## 2016-07-12 DIAGNOSIS — Z992 Dependence on renal dialysis: Secondary | ICD-10-CM

## 2016-07-12 DIAGNOSIS — N186 End stage renal disease: Secondary | ICD-10-CM

## 2017-03-21 ENCOUNTER — Other Ambulatory Visit: Payer: Self-pay | Admitting: Radiology

## 2017-03-21 ENCOUNTER — Encounter: Payer: Self-pay | Admitting: Emergency Medicine

## 2017-03-21 ENCOUNTER — Other Ambulatory Visit: Payer: Medicare (Managed Care)

## 2017-03-21 ENCOUNTER — Emergency Department: Payer: Medicare (Managed Care)

## 2017-03-21 ENCOUNTER — Other Ambulatory Visit: Payer: Self-pay | Admitting: Cardiology

## 2017-03-21 ENCOUNTER — Inpatient Hospital Stay
Admission: EM | Admit: 2017-03-21 | Discharge: 2017-03-27 | DRG: 640 | Disposition: A | Discharge status: Home / Self Care | Payer: Medicare (Managed Care) | Source: Ambulatory Visit | Attending: Internal Medicine | Admitting: Internal Medicine

## 2017-03-21 ENCOUNTER — Observation Stay: Payer: Medicare (Managed Care)

## 2017-03-21 ENCOUNTER — Inpatient Hospital Stay: Payer: Medicare (Managed Care)

## 2017-03-21 DIAGNOSIS — E1122 Type 2 diabetes mellitus with diabetic chronic kidney disease: Secondary | ICD-10-CM | POA: Diagnosis present

## 2017-03-21 DIAGNOSIS — R05 Cough: Secondary | ICD-10-CM

## 2017-03-21 DIAGNOSIS — J181 Lobar pneumonia, unspecified organism: Secondary | ICD-10-CM

## 2017-03-21 DIAGNOSIS — R0902 Hypoxemia: Secondary | ICD-10-CM | POA: Diagnosis present

## 2017-03-21 DIAGNOSIS — G8929 Other chronic pain: Secondary | ICD-10-CM | POA: Diagnosis present

## 2017-03-21 DIAGNOSIS — Z9115 Patient's noncompliance with renal dialysis: Secondary | ICD-10-CM

## 2017-03-21 DIAGNOSIS — H539 Unspecified visual disturbance: Secondary | ICD-10-CM | POA: Diagnosis present

## 2017-03-21 DIAGNOSIS — E11649 Type 2 diabetes mellitus with hypoglycemia without coma: Secondary | ICD-10-CM | POA: Diagnosis present

## 2017-03-21 DIAGNOSIS — B348 Other viral infections of unspecified site: Secondary | ICD-10-CM | POA: Diagnosis present

## 2017-03-21 DIAGNOSIS — H538 Other visual disturbances: Secondary | ICD-10-CM

## 2017-03-21 DIAGNOSIS — E871 Hypo-osmolality and hyponatremia: Secondary | ICD-10-CM | POA: Diagnosis present

## 2017-03-21 DIAGNOSIS — N186 End stage renal disease: Secondary | ICD-10-CM | POA: Diagnosis present

## 2017-03-21 DIAGNOSIS — G9340 Encephalopathy, unspecified: Secondary | ICD-10-CM

## 2017-03-21 DIAGNOSIS — R4182 Altered mental status, unspecified: Secondary | ICD-10-CM | POA: Diagnosis present

## 2017-03-21 DIAGNOSIS — E875 Hyperkalemia: Principal | ICD-10-CM | POA: Diagnosis present

## 2017-03-21 DIAGNOSIS — B338 Other specified viral diseases: Secondary | ICD-10-CM

## 2017-03-21 DIAGNOSIS — I4510 Unspecified right bundle-branch block: Secondary | ICD-10-CM

## 2017-03-21 DIAGNOSIS — D631 Anemia in chronic kidney disease: Secondary | ICD-10-CM | POA: Diagnosis present

## 2017-03-21 DIAGNOSIS — I120 Hypertensive chronic kidney disease with stage 5 chronic kidney disease or end stage renal disease: Secondary | ICD-10-CM | POA: Diagnosis present

## 2017-03-21 DIAGNOSIS — R509 Fever, unspecified: Secondary | ICD-10-CM

## 2017-03-21 DIAGNOSIS — Z932 Ileostomy status: Secondary | ICD-10-CM

## 2017-03-21 DIAGNOSIS — R413 Other amnesia: Secondary | ICD-10-CM | POA: Diagnosis present

## 2017-03-21 DIAGNOSIS — Z992 Dependence on renal dialysis: Secondary | ICD-10-CM

## 2017-03-21 DIAGNOSIS — E119 Type 2 diabetes mellitus without complications: Secondary | ICD-10-CM | POA: Diagnosis present

## 2017-03-21 DIAGNOSIS — H547 Unspecified visual loss: Secondary | ICD-10-CM

## 2017-03-21 HISTORY — DX: Hyperlipidemia, unspecified: E78.5

## 2017-03-21 HISTORY — DX: Benign prostatic hyperplasia without lower urinary tract symptoms: N40.0

## 2017-03-21 HISTORY — DX: Dependence on renal dialysis: Z99.2

## 2017-03-21 HISTORY — DX: Barrett's esophagus without dysplasia: K22.70

## 2017-03-21 HISTORY — DX: Benign neoplasm of colon, unspecified: D12.6

## 2017-03-21 HISTORY — DX: Essential (primary) hypertension: I10

## 2017-03-21 LAB — CBC AND DIFFERENTIAL
Baso # K/uL: 0 10*3/uL (ref 0.0–0.1)
Basophil %: 0.4 %
Eos # K/uL: 0 10*3/uL (ref 0.0–0.5)
Eosinophil %: 0.5 %
Hematocrit: 35 % — ABNORMAL LOW (ref 40–51)
Hemoglobin: 11.1 g/dL — ABNORMAL LOW (ref 13.7–17.5)
IMM Granulocytes #: 0.1 10*3/uL (ref 0.0–0.1)
IMM Granulocytes: 1.2 %
Lymph # K/uL: 0.5 10*3/uL — ABNORMAL LOW (ref 1.3–3.6)
Lymphocyte %: 6.8 %
MCH: 32 pg/cell (ref 26–32)
MCHC: 32 g/dL (ref 32–37)
MCV: 98 fL — ABNORMAL HIGH (ref 79–92)
Mono # K/uL: 0.6 10*3/uL (ref 0.3–0.8)
Monocyte %: 7.9 %
Neut # K/uL: 6.2 10*3/uL — ABNORMAL HIGH (ref 1.8–5.4)
Nucl RBC # K/uL: 0 10*3/uL (ref 0.0–0.0)
Nucl RBC %: 0 /100 WBC (ref 0.0–0.2)
Platelets: 90 10*3/uL — ABNORMAL LOW (ref 150–330)
RBC: 3.5 MIL/uL — ABNORMAL LOW (ref 4.6–6.1)
RDW: 15.7 % — ABNORMAL HIGH (ref 11.6–14.4)
Seg Neut %: 83.2 %
WBC: 7.5 10*3/uL (ref 4.2–9.1)

## 2017-03-21 LAB — BASIC METABOLIC PANEL
Anion Gap: 18 — ABNORMAL HIGH (ref 7–16)
CO2: 26 mmol/L (ref 20–28)
Calcium: 9.2 mg/dL (ref 8.6–10.2)
Chloride: 86 mmol/L — ABNORMAL LOW (ref 96–108)
Creatinine: 11.75 mg/dL — ABNORMAL HIGH (ref 0.67–1.17)
GFR,Black: 4 * — AB
GFR,Caucasian: 4 * — AB
Glucose: 127 mg/dL — ABNORMAL HIGH (ref 60–99)
Lab: 44 mg/dL — ABNORMAL HIGH (ref 6–20)
Potassium: 7.5 mmol/L (ref 3.3–5.1)
Sodium: 130 mmol/L — ABNORMAL LOW (ref 133–145)

## 2017-03-21 LAB — RUQ PANEL (ED ONLY)
ALT: 11 U/L (ref 0–50)
AST: 13 U/L (ref 0–50)
Albumin: 4.5 g/dL (ref 3.5–5.2)
Alk Phos: 57 U/L (ref 40–130)
Amylase: 71 U/L (ref 28–100)
Bilirubin,Direct: 0.2 mg/dL (ref 0.0–0.3)
Bilirubin,Total: 0.9 mg/dL (ref 0.0–1.2)
Lipase: 8 U/L — ABNORMAL LOW (ref 13–60)
Total Protein: 7 g/dL (ref 6.3–7.7)

## 2017-03-21 LAB — POCT GLUCOSE
Glucose POCT: 113 mg/dL — ABNORMAL HIGH (ref 60–99)
Glucose POCT: 105 mg/dL — ABNORMAL HIGH (ref 60–99)

## 2017-03-21 LAB — LACTATE, PLASMA: Lactate: 2.3 mmol/L — ABNORMAL HIGH (ref 0.5–2.2)

## 2017-03-21 LAB — HOLD BLUE

## 2017-03-21 LAB — HOLD GREEN WITH GEL

## 2017-03-21 LAB — MAGNESIUM: Magnesium: 1.8 mEq/L (ref 1.3–2.1)

## 2017-03-21 MED ORDER — DEXTROSE 50 % IV SOLN *I*
25.0000 g | INTRAVENOUS | Status: AC | PRN
Start: 2017-03-21 — End: 2017-03-21

## 2017-03-21 MED ORDER — SODIUM POLYSTYRENE SULFONATE 15 GM/60ML PO SUSP *WRAPPED*
30.0000 g | Freq: Once | ORAL | Status: DC
Start: 2017-03-21 — End: 2017-03-21

## 2017-03-21 MED ORDER — ALBUTEROL SULFATE (5 MG/ML) 0.5% NEBS SOLUTION *I*
10.0000 mg | INHALATION_SOLUTION | Freq: Once | RESPIRATORY_TRACT | Status: AC
Start: 2017-03-21 — End: 2017-03-21
  Administered 2017-03-21: 10 mg via RESPIRATORY_TRACT
  Filled 2017-03-21: qty 2

## 2017-03-21 MED ORDER — SODIUM CHLORIDE 0.9 % IV BOLUS *I*
1000.0000 mL | Freq: Once | Status: AC
Start: 2017-03-21 — End: 2017-03-22
  Administered 2017-03-21: 1000 mL via INTRAVENOUS

## 2017-03-21 MED ORDER — CALCIUM CHLORIDE 10 % IV SOLN *I*
14.0000 meq | Freq: Once | INTRAVENOUS | Status: AC
Start: 2017-03-21 — End: 2017-03-21
  Administered 2017-03-21: 14 meq via INTRAVENOUS
  Filled 2017-03-21: qty 10

## 2017-03-21 MED ORDER — ALBUTEROL SULFATE (5 MG/ML) 0.5% NEBS SOLUTION *I*
INHALATION_SOLUTION | RESPIRATORY_TRACT | Status: DC
Start: 2017-03-21 — End: 2017-03-24
  Filled 2017-03-21: qty 0.5

## 2017-03-21 MED ORDER — ACETAMINOPHEN 325 MG PO TABS *I*
650.0000 mg | ORAL_TABLET | Freq: Once | ORAL | Status: AC
Start: 2017-03-21 — End: 2017-03-21
  Administered 2017-03-21: 650 mg via ORAL
  Filled 2017-03-21: qty 2

## 2017-03-21 MED ORDER — DEXTROSE 50 % IV SOLN *I*
25.0000 g | Freq: Once | INTRAVENOUS | Status: AC
Start: 2017-03-21 — End: 2017-03-21
  Administered 2017-03-21: 25 g via INTRAVENOUS
  Filled 2017-03-21: qty 50

## 2017-03-21 MED ORDER — CALCIUM GLUCONATE 4.7 MEQ (1,000 MG) IN 50 ML WRAPPED *I*
4.7000 meq | Freq: Once | INTRAVENOUS | Status: DC
Start: 2017-03-21 — End: 2017-03-21

## 2017-03-21 MED ORDER — SODIUM CHLORIDE 0.9 % IV BOLUS *I*
1000.0000 mL | Freq: Once | Status: AC
Start: 2017-03-21 — End: 2017-03-21
  Administered 2017-03-21: 1000 mL via INTRAVENOUS

## 2017-03-21 MED ORDER — HEPARIN SODIUM 5000 UNIT/ML SQ *I*
5000.0000 [IU] | Freq: Three times a day (TID) | SUBCUTANEOUS | Status: DC
Start: 2017-03-21 — End: 2017-03-22
  Administered 2017-03-22: 5000 [IU] via SUBCUTANEOUS
  Filled 2017-03-21: qty 1

## 2017-03-21 MED ORDER — INSULIN REGULAR HUMAN 100 UNIT/ML IJ SOLN *I*
0.1000 [IU]/kg | Freq: Once | INTRAMUSCULAR | Status: AC
Start: 2017-03-21 — End: 2017-03-21
  Administered 2017-03-21: 7.1 [IU] via INTRAVENOUS
  Filled 2017-03-21: qty 3

## 2017-03-21 NOTE — First Provider Contact (Signed)
ED First Provider Contact Note    Initial provider evaluation performed by   ED First Provider Contact     Date/Time Event User Comments    03/21/17 1254 ED First Provider Contact Rockell Faulks, [facility] Initial Face to Face Provider Contact        71 year old male presents with chills, cough, congestion x several days. Also notes hx glaucoma and cataracts and states vision has been dim on left for several days.    Vital signs reviewed.    Orders placed:  XRAYS    Patient requires further evaluation.     Faith Rogue, [place], 03/21/2017, 12:54 PM    Supervising physician Dr Penne Lash was immediately available     Faith Rogue, [place]  03/21/17 1303

## 2017-03-21 NOTE — ED Procedure Documentation (Signed)
Procedures   Venipuncture provider  Date/Time: 03/21/2017 5:07 PM  Performed by: Bertram Savin  Authorized by: Bertram Savin     Consent:     Consent obtained:  Emergent situation  Post-procedure details:     Patient tolerance of procedure:  Tolerated well, no immediate complications  Comments:      18g in left brachial vein        Bertram Savin, MD     Bertram Savin, MD  03/21/17 [phone]

## 2017-03-21 NOTE — ED Procedure Documentation (Signed)
Procedures   Ultrasound - Procedure Guidance  Date/Time: 03/21/2017 5:10 PM  Performed by: Bertram Savin  Authorized by: Bertram Savin       Procedure details:     Indications: vascular access      Guidance: dynamic       Exam limitations: none    Impression:     successful procedure guidance       Images were interpreted by me and archived to [facility] PACS.          Bertram Savin, MD     Bertram Savin, MD  03/21/17 [phone]

## 2017-03-21 NOTE — Progress Notes (Signed)
Report Given To   03/21/17  Pt is for 2 Hours HD treatment from ED and very lethargic upon arrival.  Slep throughout Tx but when wake him up, he responded and oriented.  No fluid removal today per order.          Descriptive Sentence / Reason for Admission   Duration of Treatment (minutes): 063 minutes  Complications: none        Active Issues / Relevant Events   Pre-Treatment Weight: 72.6 kg (160 lb 0.9 oz)        Review of medications administered  such as routine meds, antibiotics, and heparin: none.  Vital Signs: VSS        To Do List  Medications still needing administration: none dialysis related  Dressing change due :please remove dressing from Rt AVF 4 Hours post HD.      Anticipatory Guidance / Discharge Planning  Date/Time of Next Treatment: TBD by nephrologist.  Marvia Pickles, RN

## 2017-03-21 NOTE — Consults (Signed)
Neurology Stroke Consult    Referring Provider/Service: ED  Reason for consult: Visual changes in L eye    History of Present Illness:  71 y.o. male with hx of ESRD on HD, DM, Hx of FAP, Barrett's HLD, and chronic hip pain who presents with left vision changes over the past several days in setting of missed dialysis treatments this week. Neurology was consulted for possible stroke causing L eye vision changes. The pt appears acutely unwell and is not a great historian.     He began feeling unwell earlier this week. About three days ago, he developed progressive dimming vision in L eye. He denies pain with eye movements, weakness, sensory changes, changes in speech, bowel, or bladder. He endorses mild HA.     The patient takes no antiplatelets and no statin medications at home. The patient has no history of stroke. His mother had a stroke at age 85.    Past medical history:   Past Medical History:   Diagnosis Date    Barrett's esophagus     BPH (benign prostatic hyperplasia)     ESRD needing dialysis     FAP (familial adenomatous polyposis)     HLD (hyperlipidemia)     HTN (hypertension)        Past surgical history:   No past surgical history on file.    Family history:   History reviewed. No pertinent family history.    Social history:   Social History     Social History    Marital status: Divorced     Spouse name: N/A    Number of children: N/A    Years of education: N/A     Social History Main Topics    Smoking status: None    Smokeless tobacco: None    Alcohol use None    Drug use: None    Sexual activity: Not Asked     Other Topics Concern    None     Social History Narrative    None     Allergies:   No Known Allergies (drug, envir, food or latex)    Prior to Admission Medications:  Prior to Admission medications    Not on File     Review of Systems:  CONSTITUTIONAL: Appetite good, no fevers, night sweats or weight loss  EYES: No visual changes, no eye pain  ENT: No hearing difficulties, no ear  pain  CV: No chest pain, shortness of breath or peripheral edema  RESPIRATORY: No cough, wheezing or dyspnea  GI: No nausea/vomiting, abdominal pain, or change in bowel habits  GU: No dysuria, urgency or incontinence  MS: No joint pain/swelling or musculoskeletal deformities  SKIN: No rashes  PSYCH: No depression or anxiety  ENDOCRINE: No polyuria/polydipsia, no heat intolerance  HEME/LYMPH: No easy bleeding/bruising or swollen nodes    Physical Exam  BP: 140/63  HR: 78    General: Warm, flushed, unwell appearing.  Neuro Exam:  MENTAL STATUS: Mildly lethargic but pleasant, awakens easily to voice, and responds appropriately. No deficits of speech or language; oriented to person, place, and time; mood and affect were appropriate to situation.  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 pupils 3 -> 2; BTT absent on R (baseline)    III/IV/VI: versions intact without nystagmus, no gaze preference    V: facial sensation symmetric to light touch    VII: facial expression symmetric    VIII: hearing intact to voice  IX/X: palate elevates symmetrically    XI: shoulder shrug symmetric    XII: tongue midline  MOTOR: Bulk and tone normal throughout. Antigravity with no drift in all 4 extremities.  SENSATION: intact light touch in all 4 extremities.   COORDINATION: FTN without ataxia.  REFLEXES: 2+ in UE's, 1+ in LE's. Toes mute.     Baseline NIH Stroke Scale    Level of consciousness: 0=alert; keenly responsive   LOC questions:  0=Performs both tasks correctly   LOC commands: 0=Performs both tasks correctly   Best Gaze: 0=normal   Visual: 0=No visual loss   Facial Palsy: 0=Normal symmetric movement   Motor left arm: 0=No drift, limb holds 90 (or 45) degrees for full 10 seconds   Motor right arm: 0=No drift, limb holds 90 (or 45) degrees for full 10 seconds   Motor left leg: 0=No drift, limb holds 90 (or 45) degrees for full 10 seconds   Motor right leg:  0=No drift, limb holds 90 (or 45) degrees for full 10 seconds    Limb Ataxia: 0=Absent   Sensory: 0=Normal; no sensory loss   Best Language:  0=No aphasia, normal   Dysarthria: 0=Normal   Extinction and Inattention: 0=No abnormality       Total:   0     Lab Results:  K 7.5  BUN 44  Platelets 90    Imaging:  CT head - unremarkable    Assessment:   Gradual L visual changes over the course of several days in a 71 y.o. male with ESRD on HD, DM, Hx of FAP, Barrett's, and HLD. His exam shows no focal neurologic deficits. CT head unremarkable. Low suspicion for acute stroke contributing to current clinical picture. Would consider Optho consult for symptoms. No further neurologic work-up indicated.      Plan:   - No further neurologic work-up indicated.   - Will staff with Stroke attending in AM.    Ree Edman, MD  PGY2 Neurology  [facility] 4238  10:26 PM, 03/21/17    Please page covering provider with any questions or concerns.

## 2017-03-21 NOTE — ED Notes (Signed)
Pt is from out of town visiting family. He reports that for the last few days he has experienced changes in his vision, which he describes as "dullness", darkness around his visual field. Pt as states that last night he developed a sudden onset of chills. Additionally pt states that he has had a cold for the past week. He is a MWF dialysis pt and reports that he was last dialyzed on Monday.

## 2017-03-21 NOTE — ED Notes (Signed)
ED RN INTERN ATTESTATION       I Paul Half, RN (RN) reviewed the following charting information by the RN intern:  Victor Flowers     Nursing Assessments  Medications  Plan of Care  Teaching   Notes    In the chart of Victor Flowers (71 y.o. male) and attest to the charting being accurate.

## 2017-03-21 NOTE — ED Notes (Signed)
Pt off the floor to dialysis.

## 2017-03-21 NOTE — ED Notes (Signed)
03/21/17 1847   Observation Care   Observation care initiated  Yes   Patient has been verbally notified of their observation status Yes

## 2017-03-21 NOTE — ED Provider Notes (Addendum)
History     Chief Complaint   Patient presents with    Chills     HPI Comments: 71 yo male w/ hx glaucoma, cataracts, ostomy 2/2 polyposis, DM, ESRD on HD presents with chills, cough, congestion and fatigue worsening over the past couple of days, in the setting of missed last HD.  He c/o intermittent mild dimming of vision in L eye over this same timeframe, but denies CP, Abd pain, diarrhea, black or bloody stool.  He states he has felt similar sx in past when he has missed dialysis and has had high potassium.      History provided by:  Patient  Language interpreter used: No        Medical/Surgical/Family History     History reviewed. No pertinent past medical history.     There is no problem list on file for this patient.           No past surgical history on file.  History reviewed. No pertinent family history.       Social History   Substance Use Topics    Smoking status: None    Smokeless tobacco: None    Alcohol use None     Living Situation     Questions Responses    Patient lives with     Homeless     Caregiver for other family member     External Services     Employment     Domestic Violence Risk                 Review of Systems   Review of Systems   Constitutional: Positive for chills, diaphoresis and fatigue.   HENT: Positive for congestion. Negative for sore throat and trouble swallowing.    Eyes: Positive for visual disturbance.   Respiratory: Positive for cough. Negative for shortness of breath.    Cardiovascular: Negative for chest pain, palpitations and leg swelling.   Gastrointestinal: Negative for abdominal pain, blood in stool, diarrhea, nausea and vomiting.   Musculoskeletal: Negative for back pain, neck pain and neck stiffness.   Skin: Negative for rash.   Neurological: Negative for seizures, syncope, weakness, numbness and headaches.   Psychiatric/Behavioral: Negative for confusion.       Physical Exam     Triage Vitals  Triage Start: Start, (03/21/17 1256)   First Recorded BP: 147/69,  Resp: 18, Temp: 37 C (98.6 F), Temp src: TEMPORAL Oxygen Therapy SpO2: 95 %, Oximetry Source: Rt Hand, O2 Device: None (Room air), Heart Rate: 89, (03/21/17 1255)  .  First Pain Reported  0-10 Scale: 0, (03/21/17 1255)       Physical Exam   Constitutional: He is oriented to person, place, and time. He appears well-nourished.   Febrile, chronically ill-appearing, fatigued, non-toxic   HENT:   Head: Normocephalic and atraumatic.   Right Ear: External ear normal.   Left Ear: External ear normal.   Nose: Nose normal.   Mouth/Throat: Mucous membranes are dry. No oropharyngeal exudate.   Eyes: EOM are normal.     Cataracts appreciated bi-laterally   Neck: Normal range of motion. Neck supple. No tracheal deviation present.   Cardiovascular: Normal rate, regular rhythm, normal heart sounds and intact distal pulses.    Pulmonary/Chest: Effort normal and breath sounds normal. No stridor. No respiratory distress. He has no wheezes. He has no rales.   Abdominal: Soft. Bowel sounds are normal. He exhibits no distension. There is no tenderness.   Well-healed old, large  midline scar.  Ostomy in place without signs of infection, D/C, swelling or dysfunction.   Musculoskeletal: He exhibits no edema, tenderness or deformity.   Neurological: He is alert and oriented to person, place, and time. He has normal strength. He displays normal reflexes. No sensory deficit. He exhibits normal muscle tone. Coordination normal.   Skin: Skin is warm and dry. No rash noted. He is not diaphoretic.   Psychiatric: He has a normal mood and affect.   Nursing note and vitals reviewed.      Medical Decision Making        Initial Evaluation:  ED First Provider Contact     Date/Time Event User Comments    03/21/17 1254 ED First Provider Contact [place], [facility] Initial Face to Face Provider Contact          Patient seen by me on 03/21/2017.    Assessment:  70 y.o.male comes to the ED with fever, chills, fatigue and vague respiratory sx in setting of  multiple chronic illness and non-compliant on recent HD w/ hx hyperkalemia similar to today's presentation.  In ED he is febrile and ill-appearing, although non-toxic.  Will eval STAT EKG and K+, treat if pathologic, contact nephrology for HD, conduct infectious/Sepsis work-up.  Pt will require admission.      Differential Diagnosis includes:  Viral syndrome, PNA, Viral URI, Bronchitis, Electrolyte abnormality, Hyperkalemia, Galucoma, Cataracts, less likely Stroke, Sepsis      Plan: IVF, PO tylenol, EKG, POCT K+, CBC, BMP, RUQ panel, Blood Cx, Lactate, CXR, Mg, BG, Tele    Levonne Hubert, MD      Resident Attestation:    Patient seen by me on 03/21/2017.    History:  I reviewed this patient, reviewed the resident's note and agree.    Exam:  I examined this patient, reviewed the resident's note and agree.    Decision Making:  I discussed with the resident his/her documented decision making and agree.      Critical Care    There is a high probability of imminent or life threatening deterioration due to circulatory failure, CNS failure or compromise and metabolic crisis.    Acute interventions include documenting the case, evaluation of patient's response to treatment, initial history and physical exam, IV meds (specify below), ordering and review of radiographic studies, ordering and review of laboratory studies, ordering and performing treatments and interventions, re-evaluation of patient's condition and review of old charts.    IV medications given:  Calcium    Exact time of critical care (exclusive of other billable procedures) 37 minutes.      Author:  Bertram Savin, MD       Schorr, Ysidro Evert, MD  Resident  03/21/17 2349       Bertram Savin, MD  03/25/17 [phone]

## 2017-03-21 NOTE — ED Notes (Signed)
Report Given To  Taron, RN      Descriptive Sentence / Reason for Admission   Pt is is out of town visiting family, x2 days of increasing fatigue and "dullness" in his vision. MWF dialysis pt, last received treatment on Monday. HD cath, right arm.      Active Issues / Relevant Events   -Lactate 2.3, K 7.5  Left EJ  Tele  -A&Ox4      To Do List  -Vs q4  -Urinalysis  -K?      Anticipatory Guidance / Discharge Planning  Obs for hyperkalemia

## 2017-03-21 NOTE — ED Notes (Signed)
Unable to obtain potassium, provider aware.

## 2017-03-21 NOTE — ED Notes (Signed)
Received report, assumed care of pt at this time. Pt appears in no acute distress, on tele. Call Tariah Transue within reach, will continue to monitor and treat per provider order.
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
## 2017-03-21 NOTE — Consults (Signed)
Acute Nephrology Consult Note     Reason for consult: ESRD on HD MWF  Who is coming to ED with chills and dim left eye vision  Requesting provider: Bertram Savin, MD    History of Present Illness:  Victor Flowers is a 71 y.o. Male with history of ESRD on HD MWF in [place], DM type 2, history of FAP, Barrett's esophagus, GERD, HLD, and chronic hip pain who was brought to [facility] ED todday because of chills and AMS. He was complaining of dim vision of his left eye that started this week. 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 Patient has some confusion when him in ED and does not recall when was his last HD. He came for a visit from [place] and says he was supposed to stay in [place] for 10 days but ended up staying more than that. He is oriented to date and is sure that he was getting dialysis while in [place] currently, but completely forgot where or when was his last session. Neurology saw patient in the ED and don't think it is a stroke but he will have CT head done. Insulin with dextrose and IV calcium chloride administered in ED.     Review of Systems:  Constitutional:  chills  Eye:  Dim vision in left eye  Ear/Nose/Mouth/Throat:  Present nasal congestion  Respiratory:  no shortness of breath, no breathing difficulties, present cough   Cardiovascular:  No chest pain, palpitations, or peripheral edema.    Gastrointestinal:  No abdominal pain, nausea, vomiting, diarrhea, or constipation.    Genitourinary:  No dysuria, hematuria, or change in urine stream.    Musculoskeletal:  No joint pain, swollen joint or muscle pain.    Integumentary:  No rash, erythema, or signs of cellulitis.    Neurologic: No confusion, numbness, tingling, diplopia, or focal weakness or focal neurological symptoms.      History reviewed. No pertinent past medical history.    No past surgical history on file.    History reviewed. No pertinent family history.    Social History     Social History    Marital status: Divorced     Spouse  name: N/A    Number of children: N/A    Years of education: N/A     Social History Main Topics    Smoking status: None    Smokeless tobacco: None    Alcohol use None    Drug use: None    Sexual activity: Not Asked     Other Topics Concern    None     Social History Narrative    None       Allergies: No Known Allergies (drug, envir, food or latex)     Prior to Admission Medications:    (Not in a hospital admission)    Current Meds  Current Facility-Administered Medications   Medication Dose Route Frequency    dextrose 50% (0.5 g/mL) injection 25 g  25 g Intravenous Q1H PRN     No current outpatient prescriptions on file.         Physical Examination:    Current Vitals Vitals Range (24 Hours)   BP 146/54   Pulse 65   Temp (!) 38.8 C (101.9 F) (Oral)    Resp 18   Ht 1.753 m (5\' 9" )   Wt 72.6 kg (160 lb)   SpO2 98%   BMI 23.63 kg/m2 BP: (140-147)/(54-69)   Temp:  [37 C (98.6 F)-38.8 C (  101.9 F)]   Temp src: Oral (08/10 1405)  Heart Rate:  [65-89]   Resp:  [14-18]   SpO2:  [95 %-98 %]   Height:  [175.3 cm (5\' 9" )]   Weight:  [72.6 kg (160 lb)]        Intake/Output last 3 shifts:   Weight:   Last 4 Weights    03/21/17 1255   Weight: 72.6 kg (160 lb)       General: oriented to day  ENT: Mucus membranes are moist, there are no exudates, no lymphadenopathy or adenopathy.   CVS: S1, S2, Regular rate and rhythm, no rubs or murmur   Pulm: CTA bilaterally, there are no wheezes, mild crackles crackles.  Abd: Soft, non-tender, non-distended  Ext: no LE edema- RUE arm AVF with weak thrill but strong bruit  Neuro: CN II-XII are grossly intact, there are no focal neurological deficits, moves all 4 extremities.     Labs:    Recent Labs  Lab 03/21/17  1706   WBC 7.5   Hemoglobin 11.1*   Hematocrit 35*   Platelets 90*       Recent Labs  Lab 03/21/17  1706   Sodium 130*   Potassium 7.5*   Chloride 86*   CO2 26   UN 44*   Creatinine 11.75*   Glucose 127*   Calcium 9.2   Albumin 4.5         Component Value Date/Time    WBC  7.5 03/21/2017 1706    HGB 11.1 (L) 03/21/2017 1706    HCT 35 (L) 03/21/2017 1706    PLT 90 (L) 03/21/2017 1706     No results for input(s): ABE, AOSAT, APCO2, APO2, APH in the last 168 hours.  No results found for: C3, C4, ANAS, ANAT, DNAN, ANCAS, ANCAP, ANCAT, GBMAB    Assessment/Plan:  Victor Flowers is a 71 y.o. Male with ESRD on HD MWF in [place], DM type 2, history of FAP, Barrett's esophagus, GERD, HLD, and chronic hip pain who was brought to [facility] ED todday because of chills and AMS.    1. ESRD on HD for a year now in [place], on MWF per patient  Unclear when was his last HD session- Patient says he was dialyzed since he came to [place] but unclear when or where  Per Nephrology note from [place], cause of patient's CKD is thought to be secondary to Diabetic nephropathy and hypertensive nephrosclerosis    Hyperkalemia with K 7.5 and peaked T waves on ECG  S/p IV insulin and dextrose and IV calcium gluconate with NS bolus of 500 cc  Patient will need urgent HD session for 2 hrs, 300 ml/min Qb, 1K bath  Orders placed and Dialysis unit notified  Will need to be reassessed in am for further HD sessions    2- Left eye vision problem that started this week  Neurology consulted and don't think it a stroke- CT scan will be done  Will use 300 ml/min Blood flow just given the low likelihood of stroke    3- Fever and chills  Patient on HD with fever--- I recommended obtaining blood cultures  Elevated lactic acid  ? URI vs bacteremia vs other etiology    4. Acid Base  - Normal Bicarb    5. Hyperkalemia and Hyponatremia  K 7.5 as above and patient will need urgent HD  Na 130 --- will dialyze with Na bath of 136    6. Volume/Blood  Pressure  - BP stable  - Will not remove fluids with HD today given suspicion of sepsis    Case discussed with Dr Charolotte Capuchin.    Thank you for the consultation. We will follow along with you.    Author: Johnney Killian, MD  Note created: 03/21/2017  at: 6:30 PM  Nephrology  Fellow

## 2017-03-21 NOTE — ED Triage Notes (Signed)
c/o chills and vision changes over days.     Triage Note   Clovis Cao, RN

## 2017-03-22 ENCOUNTER — Encounter: Payer: Self-pay | Admitting: Nurse Practitioner

## 2017-03-22 DIAGNOSIS — R918 Other nonspecific abnormal finding of lung field: Secondary | ICD-10-CM

## 2017-03-22 LAB — CBC AND DIFFERENTIAL
Baso # K/uL: 0 10*3/uL (ref 0.0–0.1)
Basophil %: 0.6 %
Eos # K/uL: 0 10*3/uL (ref 0.0–0.5)
Eosinophil %: 0.6 %
Hematocrit: 31 % — ABNORMAL LOW (ref 40–51)
Hemoglobin: 10.1 g/dL — ABNORMAL LOW (ref 13.7–17.5)
IMM Granulocytes #: 0.1 10*3/uL (ref 0.0–0.1)
IMM Granulocytes: 1.3 %
Lymph # K/uL: 0.6 10*3/uL — ABNORMAL LOW (ref 1.3–3.6)
Lymphocyte %: 11.3 %
MCH: 31 pg/cell (ref 26–32)
MCHC: 32 g/dL (ref 32–37)
MCV: 97 fL — ABNORMAL HIGH (ref 79–92)
Mono # K/uL: 0.5 10*3/uL (ref 0.3–0.8)
Monocyte %: 8.4 %
Neut # K/uL: 4.2 10*3/uL (ref 1.8–5.4)
Nucl RBC # K/uL: 0 10*3/uL (ref 0.0–0.0)
Nucl RBC %: 0.2 /100 WBC (ref 0.0–0.2)
Platelets: 87 10*3/uL — ABNORMAL LOW (ref 150–330)
RBC: 3.2 MIL/uL — ABNORMAL LOW (ref 4.6–6.1)
RDW: 15.5 % — ABNORMAL HIGH (ref 11.6–14.4)
Seg Neut %: 77.8 %
WBC: 5.3 10*3/uL (ref 4.2–9.1)

## 2017-03-22 LAB — FERRITIN: Ferritin: 1000 ng/mL — ABNORMAL HIGH (ref 20–250)

## 2017-03-22 LAB — MAGNESIUM: Magnesium: 1.6 mEq/L (ref 1.3–2.1)

## 2017-03-22 LAB — TIBC
Iron: 29 ug/dL — ABNORMAL LOW (ref 45–170)
TIBC: 233 ug/dL — ABNORMAL LOW (ref 250–450)
Transferrin Saturation: 12 % — ABNORMAL LOW (ref 20–55)

## 2017-03-22 LAB — HEPATITIS B PROF
HBV Core Ab: NEGATIVE
HBV S Ab Quant: 4.69 m[IU]/mL
HBV S Ab: NEGATIVE
HBV S Ag: NEGATIVE

## 2017-03-22 LAB — POTASSIUM: Potassium: 5.1 mmol/L (ref 3.3–5.1)

## 2017-03-22 LAB — RESPIRATORY VIRAL/BACTERIAL PCR PANEL: Respiratory Viral/Bacterial PCR Panel: POSITIVE — AB

## 2017-03-22 LAB — PROCALCITONIN: Procalcitonin: 1.3 ng/mL — ABNORMAL HIGH (ref 0.00–0.09)

## 2017-03-22 LAB — BASIC METABOLIC PANEL
Anion Gap: 15 (ref 7–16)
CO2: 29 mmol/L — ABNORMAL HIGH (ref 20–28)
Calcium: 9.1 mg/dL (ref 8.6–10.2)
Chloride: 91 mmol/L — ABNORMAL LOW (ref 96–108)
Creatinine: 7.96 mg/dL — ABNORMAL HIGH (ref 0.67–1.17)
GFR,Black: 7 * — AB
GFR,Caucasian: 6 * — AB
Glucose: 97 mg/dL (ref 60–99)
Lab: 28 mg/dL — ABNORMAL HIGH (ref 6–20)
Potassium: 5.1 mmol/L (ref 3.3–5.1)
Sodium: 135 mmol/L (ref 133–145)

## 2017-03-22 LAB — TSH: TSH: 0.62 u[IU]/mL (ref 0.27–4.20)

## 2017-03-22 LAB — CRP: CRP: 14 mg/L — ABNORMAL HIGH (ref 0–10)

## 2017-03-22 LAB — ALCOHOL SCREEN
Acetone: <10 mg/dL (ref 0–9)
Ethanol: <10 mg/dL (ref 0–9)
Isopropanol: <10 mg/dL (ref 0–9)
Methanol: <10 mg/dL (ref 0–9)

## 2017-03-22 LAB — POCT GLUCOSE: Glucose POCT: 148 mg/dL — ABNORMAL HIGH (ref 60–99)

## 2017-03-22 LAB — SEDIMENTATION RATE, AUTOMATED: Sedimentation Rate: 27 mm/hr — ABNORMAL HIGH (ref 0–20)

## 2017-03-22 LAB — VITAMIN B12: Vitamin B12: 258 pg/mL (ref 232–1245)

## 2017-03-22 LAB — PHOSPHORUS: Phosphorus: 6.1 mg/dL — ABNORMAL HIGH (ref 2.7–4.5)

## 2017-03-22 LAB — LACTATE, PLASMA: Lactate: 1.9 mmol/L (ref 0.5–2.2)

## 2017-03-22 MED ORDER — LATANOPROST 0.005 % OP SOLN *I*
1.0000 [drp] | Freq: Every evening | OPHTHALMIC | Status: DC
Start: 2017-03-22 — End: 2017-03-27
  Administered 2017-03-22 – 2017-03-26 (×6): 1 [drp] via OPHTHALMIC
  Filled 2017-03-22: qty 2.5

## 2017-03-22 MED ORDER — DORZOLAMIDE HCL 2 % OP SOLN *I*
1.0000 [drp] | Freq: Two times a day (BID) | OPHTHALMIC | Status: DC
Start: 2017-03-22 — End: 2017-03-27
  Administered 2017-03-22 – 2017-03-27 (×10): 1 [drp] via OPHTHALMIC
  Filled 2017-03-22 (×3): qty 10

## 2017-03-22 MED ORDER — TIMOLOL MALEATE 0.5 % OP SOLN *I*
1.0000 [drp] | Freq: Two times a day (BID) | OPHTHALMIC | Status: DC
Start: 2017-03-22 — End: 2017-03-27
  Administered 2017-03-22 – 2017-03-27 (×10): 1 [drp] via OPHTHALMIC
  Filled 2017-03-22 (×3): qty 5

## 2017-03-22 MED ORDER — CEFTRIAXONE SODIUM 1 G IN STERILE WATER 10ML SYRINGE *I*
1.0000 g | INTRAVENOUS | Status: DC
Start: 2017-03-22 — End: 2017-03-23
  Administered 2017-03-22: 1 g via INTRAVENOUS
  Filled 2017-03-22: qty 10

## 2017-03-22 MED ORDER — DOXYCYCLINE HYCLATE 100 MG PO TABS *I*
100.0000 mg | ORAL_TABLET | Freq: Two times a day (BID) | ORAL | Status: DC
Start: 2017-03-22 — End: 2017-03-23
  Administered 2017-03-22 – 2017-03-23 (×2): 100 mg via ORAL
  Filled 2017-03-22 (×2): qty 1

## 2017-03-22 NOTE — Progress Notes (Signed)
03/22/17 2000   [facility] Patient Class Review   Patient Class Review Inpatient   Patient Class Effective 03/21/17    Victor Posey  RN [facility]  Utilization Management  [phone]     Pager [phone]

## 2017-03-22 NOTE — ED Notes (Signed)
Pt desat to 84% on RA so placed on 3L NC and sat at 91%, provider aware.

## 2017-03-22 NOTE — ED Notes (Signed)
Pt ostomy soiled. Area cleaned and new ostomy bag placed.

## 2017-03-22 NOTE — ED Notes (Signed)
Received report. Assumed care of patient at this time. Pt requesting that his BG be checked.

## 2017-03-22 NOTE — ED Notes (Signed)
Pt c/o discharge coming from ostomy. Pt covered in feces. Ostomy was replaced, pt cleaned, and repositioned. Pt requesting catheter. Ostomy was bleeding small amount.

## 2017-03-22 NOTE — Progress Notes (Signed)
NEUROLOGY CONSULT PROGRESS NOTE    Length of Stay:  LOS: 1 day     Overnight/Interim Events:  - NAE    SUBJECTIVE:  - Requesting to see daughter. States vision is dark on the left, and it's been gradually worsening over 1-2 weeks. Still perceives light, movement & objects (just not as well)    OBJECTIVE:  Vitals:  Temp:  [36.9 C (98.4 F)-38.8 C (101.9 F)] 36.9 C (98.4 F)  Heart Rate:  [58-89] 75  Resp:  [12-21] 15  BP: (114-147)/(44-69) 120/54    Neuro:  Mental Status: Awake and alert. Fluent. Naming and comprehension intact. No dysarthria. Oriented to person, [facility] & month/year.  Cranial Nerves:       II: difficulty observing disc on left, though no gross vitreous hemorrhage, pupils 3 to 2 on left (right eye opacified), fields intact to finger counting on left    III/IV/VI: Versions intact without nystagmus, no gaze preference    V: Facial sensation symmetric to light touch    VII: Facial expression symmetric    VIII: Hearing intact to voice    IX/X: Palate elevates symmetrically    XI: Shoulder shrug symmetric    XII: Tongue midline  Motor: Bulk and tone were normal throughout. Pronator drift was absent. Legs antigravity. There were no abnormal movements.  Sensory: Sensation intact and symmetric LT throughout  Coordination: No dysmetria on finger to nose testing    Medications:  Scheduled Meds:    dorzolamide  1 drop Left Eye 2 times per day    timolol  1 drop Left Eye 2 times per day    latanoprost  1 drop Left Eye Nightly     Continuous Infusions:   PRN Meds:     Labs:    Labs have been reviewed and are notable for:  GFR 6    Imaging:    No new pertinent imaging    ASSESSMENT:  Progressive L-monocular vision loss in 71 y.o. male with a history of ESRD on HD, DM, Hx of FAP, chronic right eye vision impairment and HLD. Exam is notable for preserved vision to finger counting. Given the progressive nature of his vision loss, it's not consistent with an acute vascular event. With the presence of HA, temporal  arteritis should be ruled out    PLAN:    -Agree with ophthalmology consult  -Please obtain ESR/CRP  -If ophthalmologic workup is negative, recommend MRI head w/ thin cuts through orbits & MRA head/neck w/out contrast  -Hold ASA/statin for now given low suspicion for stroke  -Stroke neurology to follow for results    Signed: Ericka Pontiff, MD  03/22/2017 at: 10:44 AM

## 2017-03-22 NOTE — Progress Notes (Signed)
Nephrology Consult Attending    Pt seen again on hemodialysis to assess hemodynamic stability, ultrafiltration goal.      Presently dialyzing on F180. UF at 900 mL.  BP: (!) 103/48  Continue current dialysis prescription.    Author: Egbert Garibaldi, MD  as of: 03/22/2017  at: 2:26 PM

## 2017-03-22 NOTE — Progress Notes (Signed)
Report Given To   03/22/17 st able 3 hr tx; minimal fluid removed       Descriptive Sentence / Reason for Admission   Duration of Treatment (minutes): 867 minutes  Complications: na        Active Issues / Relevant Events   Pre-Treatment Weight: 72.6 kg (160 lb 0.9 oz)        Review of medications administered  such as routine meds, antibiotics, and heparin: na  Vital Signs: VSS        To Do List  Medications still needing administration: na  Dressing change due : remove avf dressing tomorrow       Anticipatory Guidance / Discharge Planning  Date/Time of Next Treatment: TBD by nephrologist.

## 2017-03-22 NOTE — ED Notes (Signed)
Transferred patient to hospital bed for comfort. Call bell within reach and belongings placed with patient in bed.

## 2017-03-22 NOTE — ED Notes (Signed)
Pt awaking drinking water and eating [place] sandwich.

## 2017-03-22 NOTE — Consults (Addendum)
Ophthalmology Consult      Patient name: Victor Flowers  DOB: 27-Nov-1945       Age: 71 y.o.  MR#: [id number]    Date: 03/22/2017    Reason for consult: Dimming vision in left eye    HPI:   Victor Flowers is a 71 yo male w/ hx glaucoma, cataracts, ostomy 2/2 polyposis, DM, ESRD on HD presents with chills, cough, congestion and fatigue worsening over the past couple of days, in the setting of missed last HD.  He c/o intermittent mild dimming of vision in L eye gradually worsening in the past week.  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  His left eye is on treatment for glaucoma: dorzolamide BID, timolol BID, and lantanoprost. ESR, CRP slightly elevated.     Past Medical History:   Diagnosis Date    Barrett's esophagus     BPH (benign prostatic hyperplasia)     ESRD needing dialysis     FAP (familial adenomatous polyposis)     HLD (hyperlipidemia)     HTN (hypertension)      Social History     Social History    Marital status: Divorced     Spouse name: N/A    Number of children: N/A    Years of education: N/A     Occupational History    Not on file.     Social History Main Topics    Smoking status: Not on file    Smokeless tobacco: Not on file    Alcohol use Not on file    Drug use: Not on file    Sexual activity: Not on file     Social History Narrative       History reviewed. No pertinent family history.    Medications:   dorzolamide  1 drop Left Eye 2 times per day    timolol  1 drop Left Eye 2 times per day    latanoprost  1 drop Left Eye Nightly    ceftriaxone  1 g Intravenous Q24H    doxycycline hyclate  100 mg Oral 2 times per day       Allergies   Allergen Reactions    Metformin Diarrhea    Bactrim [Sulfamethoxazole-Trimethoprim] Other (See Comments)     weakness    Heparin Other (See Comments)     Thrombocytopenia. Unclear severity. Per chart review, had a positive SRA    Lisinopril Other (See Comments)     Weakness, renal function  changes       Review of systems, past medical, family, and social history as documented above. And been reviewed and confirmed with the patient.    Vision: LP OD / J1+ OS with correction  IOP: not able to measure due to pt discomfort / 12  by TP at 1:32 PM  Pupils: round and reactive OS, not able to assess APD    EOM: FULL OS  CVF: srestricted    Color: 4 / 14 HRR OS    Slit lamp exam:     OD OS       Orbit/adnexa/lids:       Clear, phthisis present   clear     Conj / sclera:     white and quiet   white and quiet     Cornea:     opaque   clear     Anterior Chamber:     No view   deep  and quiet     Iris:     No view   radially symmetric     Lens:     No view   clear     Patient Dilated: with combo gtts (phenylephrine and tropicamide) at approximately 1:32 PM (dilation typically lasts 6 hours but may have effects up to 24)    Fundus exam:       OD OS   Disc:   No view pink with healthy rims, no disc edema, C/D ratio 0.8   Fundus:   No view macula / vitreous clear; periphery significant for patches of atrophy in superior and nasal field     Assessment and Plan:    Subjective Visual Disturbance  - VA/IOP wnl  - posterior exam concerning for enlarged cupping 0.8 and patches of retinal atrophy, but no baseline to compare to  - agree with neurology that course of progression over 1 wk suggests against vascular process  - will obtain prior ophthalmology visits to compare   - consider getting OCT macular, OCT optic nerve head and visual field next week.  - differential diagnosis consisted of optic neuropathy vs maculopathy given poor performance on HHR   - will follow while pt is inpatient    Please page ophthalmology with any questions or concerns.  Thank for your allowing [place] to participate in the care of this patient.    Ophthalmology Clinic Phone Number: [phone]     Patient seen on call. Attending attestation to follow.    Authored by Erlinda Hong He, MD on 03/22/2017 at 1:32 PM     Chart has been reviewed and pt has been  discussed with the resident above. I agree with assessment and plan documented above.

## 2017-03-22 NOTE — ED Notes (Signed)
Report Given To  Kae Heller, RN      Descriptive Sentence / Reason for Admission   Pt is is out of town visiting family, x2 days of increasing fatigue and "dullness" in his vision. MWF dialysis pt, last received treatment on Monday. HD cath, right arm.      Active Issues / Relevant Events   -Lactate 2.3, K 7.5->5.1  -Left EJ  -Tele  -A&Ox4  -Ileostomy  -Low potassium diet      To Do List  -Vs q4  -Urinalysis        Anticipatory Guidance / Discharge Planning  Admitted for Hyperkalemia

## 2017-03-22 NOTE — ED Notes (Signed)
Pt transported to dialysis

## 2017-03-22 NOTE — ED Notes (Signed)
Received report, assumed care of patient at this time. Pt. Appears in no acute distress. Call bell within reach. Will continue to monitor and treat per provider orders.

## 2017-03-22 NOTE — ED Notes (Signed)
Provider by bedside.
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
## 2017-03-23 ENCOUNTER — Inpatient Hospital Stay: Payer: Medicare (Managed Care)

## 2017-03-23 ENCOUNTER — Inpatient Hospital Stay: Payer: Medicare (Managed Care) | Admitting: Radiology

## 2017-03-23 DIAGNOSIS — M542 Cervicalgia: Secondary | ICD-10-CM

## 2017-03-23 DIAGNOSIS — M8588 Other specified disorders of bone density and structure, other site: Secondary | ICD-10-CM

## 2017-03-23 DIAGNOSIS — M4692 Unspecified inflammatory spondylopathy, cervical region: Secondary | ICD-10-CM

## 2017-03-23 DIAGNOSIS — I6522 Occlusion and stenosis of left carotid artery: Secondary | ICD-10-CM

## 2017-03-23 DIAGNOSIS — M47812 Spondylosis without myelopathy or radiculopathy, cervical region: Secondary | ICD-10-CM

## 2017-03-23 LAB — CBC AND DIFFERENTIAL
Baso # K/uL: 0 10*3/uL (ref 0.0–0.1)
Basophil %: 0.6 %
Eos # K/uL: 0.3 10*3/uL (ref 0.0–0.5)
Eosinophil %: 4.3 %
Hematocrit: 33 % — ABNORMAL LOW (ref 40–51)
Hemoglobin: 10.9 g/dL — ABNORMAL LOW (ref 13.7–17.5)
IMM Granulocytes #: 0.1 10*3/uL (ref 0.0–0.1)
IMM Granulocytes: 1.2 %
Lymph # K/uL: 1.2 10*3/uL — ABNORMAL LOW (ref 1.3–3.6)
Lymphocyte %: 18.2 %
MCH: 32 pg/cell (ref 26–32)
MCHC: 33 g/dL (ref 32–37)
MCV: 97 fL — ABNORMAL HIGH (ref 79–92)
Mono # K/uL: 0.9 10*3/uL — ABNORMAL HIGH (ref 0.3–0.8)
Monocyte %: 13.2 %
Neut # K/uL: 4 10*3/uL (ref 1.8–5.4)
Nucl RBC # K/uL: 0 10*3/uL (ref 0.0–0.0)
Nucl RBC %: 0 /100 WBC (ref 0.0–0.2)
Platelets: 82 10*3/uL — ABNORMAL LOW (ref 150–330)
RBC: 3.4 MIL/uL — ABNORMAL LOW (ref 4.6–6.1)
RDW: 15.3 % — ABNORMAL HIGH (ref 11.6–14.4)
Seg Neut %: 62.5 %
WBC: 6.4 10*3/uL (ref 4.2–9.1)

## 2017-03-23 LAB — BASIC METABOLIC PANEL
Anion Gap: 16 (ref 7–16)
CO2: 27 mmol/L (ref 20–28)
Calcium: 9.2 mg/dL (ref 8.6–10.2)
Chloride: 92 mmol/L — ABNORMAL LOW (ref 96–108)
Creatinine: 7.25 mg/dL — ABNORMAL HIGH (ref 0.67–1.17)
GFR,Black: 8 * — AB
GFR,Caucasian: 7 * — AB
Glucose: 109 mg/dL — ABNORMAL HIGH (ref 60–99)
Lab: 28 mg/dL — ABNORMAL HIGH (ref 6–20)
Potassium: 4.8 mmol/L (ref 3.3–5.1)
Sodium: 135 mmol/L (ref 133–145)

## 2017-03-23 LAB — PHOSPHORUS: Phosphorus: 6.2 mg/dL — ABNORMAL HIGH (ref 2.7–4.5)

## 2017-03-23 LAB — POCT GLUCOSE
Glucose POCT: 135 mg/dL — ABNORMAL HIGH (ref 60–99)
Glucose POCT: 196 mg/dL — ABNORMAL HIGH (ref 60–99)

## 2017-03-23 NOTE — ED Notes (Signed)
Provided patient with ice chips and jello.
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
## 2017-03-23 NOTE — Progress Notes (Signed)
Patient is scheduled for hemodialysis 1st shift MONDAY morning. Transporters will arrive for patient between 0700-0800. Please obtain weight prior to pick up. Also, please facilitate early patient breakfast (if applicable) and administer any pre-dialysis medications. Please consult pharmacy if you are unsure of medications that should be held prior to HD.     Please weigh patient daily for accurate fluid monitoring.    Thank you.    Kristena Wilhelmi E Kahli Mayon, RN

## 2017-03-23 NOTE — Consults (Addendum)
Ostomy Care Service Consult Note  Patient Location: [facility]02[facility]     HPI:   ELAI VANWYK is a 70 y.o. year old male is referred by the patient's covering team for consultation/ evaluation of the [name] pouch supplies     "I've had this for about 30 years, they did the Whipple here, and they did the [employer] on [employer] here. My daughter is brings supplies later today, I use a catheter 3-4 times a day. The area has been breaking down for years now."    [employer] comfortable in bed, lethargic however awakens to voice, falling asleep while I was speaking to him. He denies pain and discomfort at pouch location, denies feeling of fullness or needing to intubate at this time."     Physical Exam:    Stoma:   Location: Kock pouch     Color: pink    Protrusion: prolapsed    Measures:did not measure    MCJ: did not assess    Ostomy output: watery brown approximately 100 cc of pouch    Peristomal: Did not change - appliance changed this a.m. by bedside nursing.     Plan:    -Pouch should be intubated every 6-8 hours with 30 F lubricated catheter. (Supplies ordered from hospital stores 2 3/4'' flat wafer, high output pouch, 30 F catheters)     -Patient should not have any enteric coated or timed related medications d/t incomplete absorption.   -May benefit Colorectal consult inpatient to assess stoma.    -If planning to establish care here at [facility] should have follow up / consult with Colorectal Surgery Service & Leland Johns NP at [facility] clinic.     Please call with questions or concerns. [place] service will plan to check in with patient on Monday.   Daine Gravel RN, BSN, [employer]  Pager #: [phone]

## 2017-03-23 NOTE — Plan of Care (Signed)
Neurology Plan of Care Note    Optho note reviewed. Concerned for possible optic neuropathy vs maculopathy. ESR/CRP mildly elevated.    At this point, it is not clear what is causing the vision loss. Would recommend bilateral carotid dopplers. Would also ask ophthalmology to consider GCA.    Neurology will follow the results.    Sanora Cunanan Doroteo Glassman, MD  03/23/2017  9:49 AM  Neurology, PGY-3
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
## 2017-03-23 NOTE — ED Notes (Signed)
Report Given To  B. Brigitte Pulse RN      Descriptive Sentence / Reason for Admission   71 y/o male presents to the ED with c/o chills, cough, fatigue and vision changes for the past few days; MWF dialysis patient; last tx on Monday.       Active Issues / Relevant Events   Febrile in triage - now afebrile   K 7.5--> 5.1 --> 4.8  Creatinine 11.75 --> 7.96  + for parainfluenza 3 virus       To Do List  VS/Assess  Dialysis  Low K diet  Wound/ostomy to see regarding ileostomy      Anticipatory Guidance / Discharge Planning  Admitted to medicine for hyperkalemia

## 2017-03-23 NOTE — ED Notes (Signed)
Pt stating he feels dehydrated and is concerned about how ileostomy output he's had tonight. Pt requesting to be ordered IVF. Covering provider web-paged at this time.

## 2017-03-23 NOTE — ED Notes (Signed)
Pt to Xray.

## 2017-03-23 NOTE — ED Notes (Signed)
Provider made aware pt family is here and would like to speak with them

## 2017-03-23 NOTE — ED Notes (Signed)
Pt daughter at bedside, wound care nurse notified to come speak with pt family regarding ostomy care.

## 2017-03-23 NOTE — ED Notes (Signed)
Pt to US.

## 2017-03-24 ENCOUNTER — Other Ambulatory Visit: Payer: Self-pay

## 2017-03-24 DIAGNOSIS — E119 Type 2 diabetes mellitus without complications: Secondary | ICD-10-CM

## 2017-03-24 DIAGNOSIS — Z992 Dependence on renal dialysis: Secondary | ICD-10-CM

## 2017-03-24 DIAGNOSIS — N179 Acute kidney failure, unspecified: Secondary | ICD-10-CM

## 2017-03-24 DIAGNOSIS — E875 Hyperkalemia: Principal | ICD-10-CM

## 2017-03-24 DIAGNOSIS — H5462 Unqualified visual loss, left eye, normal vision right eye: Secondary | ICD-10-CM

## 2017-03-24 DIAGNOSIS — R531 Weakness: Secondary | ICD-10-CM

## 2017-03-24 LAB — BASIC METABOLIC PANEL
Anion Gap: 19 — ABNORMAL HIGH (ref 7–16)
CO2: 23 mmol/L (ref 20–28)
Calcium: 9.2 mg/dL (ref 8.6–10.2)
Chloride: 93 mmol/L — ABNORMAL LOW (ref 96–108)
Creatinine: 10.54 mg/dL — ABNORMAL HIGH (ref 0.67–1.17)
GFR,Black: 5 * — AB
GFR,Caucasian: 4 * — AB
Glucose: 132 mg/dL — ABNORMAL HIGH (ref 60–99)
Lab: 48 mg/dL — ABNORMAL HIGH (ref 6–20)
Potassium: 5.1 mmol/L (ref 3.3–5.1)
Sodium: 135 mmol/L (ref 133–145)

## 2017-03-24 LAB — DIFF MANUAL
Bands %: 1 % (ref 0–10)
Diff Based On: 115 CELLS
React Lymph %: 2 % (ref 0–6)

## 2017-03-24 LAB — POCT GLUCOSE
Glucose POCT: 110 mg/dL — ABNORMAL HIGH (ref 60–99)
Glucose POCT: 134 mg/dL — ABNORMAL HIGH (ref 60–99)
Glucose POCT: 88 mg/dL (ref 60–99)

## 2017-03-24 LAB — CBC AND DIFFERENTIAL
Baso # K/uL: 0.1 10*3/uL (ref 0.0–0.1)
Basophil %: 0.9 %
Eos # K/uL: 0.2 10*3/uL (ref 0.0–0.5)
Eosinophil %: 3.5 %
Hematocrit: 36 % — ABNORMAL LOW (ref 40–51)
Hemoglobin: 11.8 g/dL — ABNORMAL LOW (ref 13.7–17.5)
Lymph # K/uL: 1.2 10*3/uL — ABNORMAL LOW (ref 1.3–3.6)
Lymphocyte %: 19.1 %
MCH: 32 pg/cell (ref 26–32)
MCHC: 33 g/dL (ref 32–37)
MCV: 97 fL — ABNORMAL HIGH (ref 79–92)
Mono # K/uL: 0.4 10*3/uL (ref 0.3–0.8)
Monocyte %: 7 %
Neut # K/uL: 3.9 10*3/uL (ref 1.8–5.4)
Nucl RBC # K/uL: 0 10*3/uL (ref 0.0–0.0)
Nucl RBC %: 0 /100 WBC (ref 0.0–0.2)
Platelets: 88 10*3/uL — ABNORMAL LOW (ref 150–330)
RBC: 3.7 MIL/uL — ABNORMAL LOW (ref 4.6–6.1)
RDW: 15 % — ABNORMAL HIGH (ref 11.6–14.4)
Seg Neut %: 66.9 %
WBC: 5.8 10*3/uL (ref 4.2–9.1)

## 2017-03-24 LAB — RBC MORPHOLOGY

## 2017-03-24 LAB — TRANSFERRIN: Transferrin: 155 mg/dL — ABNORMAL LOW (ref 200–360)

## 2017-03-24 LAB — PHOSPHORUS: Phosphorus: 8 mg/dL — ABNORMAL HIGH (ref 2.7–4.5)

## 2017-03-24 MED ORDER — DEXTROSE 50 % IV SOLN *I*
25.0000 g | INTRAVENOUS | Status: DC | PRN
Start: 2017-03-24 — End: 2017-03-27

## 2017-03-24 MED ORDER — INSULIN LISPRO (HUMAN) 100 UNIT/ML IJ/SC SOLN *WRAPPED*
0.0000 [IU] | Freq: Three times a day (TID) | SUBCUTANEOUS | Status: DC
Start: 2017-03-24 — End: 2017-03-27
  Administered 2017-03-25 – 2017-03-26 (×2): 2 [IU] via SUBCUTANEOUS
  Administered 2017-03-26: 1 [IU] via SUBCUTANEOUS
  Administered 2017-03-27: 3 [IU] via SUBCUTANEOUS
  Administered 2017-03-27: 1 [IU] via SUBCUTANEOUS

## 2017-03-24 MED ORDER — DEXTROSE 5 % IV SOLN WRAPPED *I*
50.0000 mL/h | INTRAVENOUS | Status: AC
Start: 2017-03-25 — End: 2017-03-25
  Administered 2017-03-25: 50 mL/h via INTRAVENOUS

## 2017-03-24 MED ORDER — GABAPENTIN 300 MG PO CAPSULE *I*
300.0000 mg | ORAL_CAPSULE | Freq: Every day | ORAL | Status: DC
Start: 2017-03-24 — End: 2017-03-24

## 2017-03-24 MED ORDER — GABAPENTIN 100 MG PO CAPSULE *I*
100.0000 mg | ORAL_CAPSULE | Freq: Every day | ORAL | Status: DC
Start: 2017-03-25 — End: 2017-03-27
  Administered 2017-03-25 – 2017-03-27 (×3): 100 mg via ORAL
  Filled 2017-03-24 (×3): qty 1

## 2017-03-24 MED ORDER — GLUCAGON HCL (RDNA) 1 MG IJ SOLR *WRAPPED*
1.0000 mg | INTRAMUSCULAR | Status: DC | PRN
Start: 2017-03-24 — End: 2017-03-27

## 2017-03-24 MED ORDER — INSULIN GLARGINE 100 UNIT/ML SC SOLN *WRAPPED*
23.0000 [IU] | Freq: Every evening | SUBCUTANEOUS | Status: DC
Start: 2017-03-24 — End: 2017-03-24
  Administered 2017-03-24: 23 [IU] via SUBCUTANEOUS

## 2017-03-24 MED ORDER — GLUCOSE 40 % PO GEL *I*
15.0000 g | ORAL | Status: DC | PRN
Start: 2017-03-24 — End: 2017-03-27

## 2017-03-24 MED ORDER — NEPHRO-VITE 0.8 MG PO TABS *I*
1.0000 | ORAL_TABLET | Freq: Every day | ORAL | Status: DC
Start: 2017-03-24 — End: 2017-03-27
  Administered 2017-03-24 – 2017-03-27 (×4): 1 via ORAL
  Filled 2017-03-24 (×4): qty 1
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
## 2017-03-24 NOTE — Plan of Care (Signed)
Cognitive function     Cognitive function will be maintained or return to baseline Progressing towards goal        Mobility     Functional status is maintained or improved - Geriatric Progressing towards goal        Nutrition     Nutritional status is maintained or improved - Geriatric Progressing towards goal        Pain/Comfort     Patient's pain or discomfort is manageable Progressing towards goal        Psychosocial     Demonstrates ability to cope with illness Progressing towards goal        Safety     Patient will remain free of falls Progressing towards goal          Pt arrived to 61200 by bed around 1500 from the ED. Pt is A&Ox3, on RA, is a SBA. A four-eyed skin assessment was completed with Serita Sheller, RN. No skin issues noted, however pt refused Probation officer and Courtney to look at his sacrum. Pt oriented to unit and call bell system, admission packet given and patient's right's booklet reviewed. Ileostomy noted with a pink and prolapsed stoma and good output. Wound/ostomy nurse assessed already. VSS and will continue to monitor. Loyola Mast, RN

## 2017-03-24 NOTE — Progress Notes (Signed)
Patient discussed during inpatient rounds - concern regarding dispo planning, as it is not clear if patient is a resident of [place] (there is information that patient is from [facility] and just "showed up" in [place]) or if he plans to stay here. Possible discharge today pending medical readiness. Writer briefly spoke with patient who states he has been staying with daughter Marzetta Board but Marzetta Board has "medical procedure" today and cannot come to ED. He provided verbal permission for writer to contact daughter. Writer called and left message for Marzetta Board [phone]) requesting return call to discuss dispo planning of patient.    Awaiting return call at this time.    Chanda Busing, [place]

## 2017-03-24 NOTE — ED Notes (Signed)
Pt without complaint, but did not want to turn over for pulmonary assessment and refused sock removal to evaluate pedal pulses. Assessment benign otherwise.

## 2017-03-24 NOTE — ED Notes (Signed)
Pt returned from dialysis. No complaints at this time, appears in NAD.

## 2017-03-24 NOTE — Plan of Care (Signed)
Neurology Plan of Care Note    CUS without hemodynamically significant stenosis. Would recommend continued ophthalmalgic workup.     Neurology will sign off. Please page with further questions.    Eternity Dexter Doroteo Glassman, MD  03/24/2017  6:53 AM  Neurology, PGY-3

## 2017-03-24 NOTE — Progress Notes (Signed)
Physical Therapy: Attempted to see patient. Patient currently off the ED unit at dialysis. Will continue to follow.

## 2017-03-24 NOTE — Consults (Addendum)
Ophthalmology Consult      Patient name: Victor Flowers  DOB: [birth year]       Age: 71 y.o.  MR#: [id number]    Date: 03/24/2017    Reason for consult: Dimming vision in left eye    HPI:   BRAISON SNOKE is a 71 yo male w/ hx glaucoma, cataracts, ostomy 2/2 polyposis, DM, ESRD on HD presents with chills, cough, congestion and fatigue worsening over the past couple of days, in the setting of missed last HD.  He c/o intermittent mild dimming of vision in L eye gradually worsening in the past week.  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  His left eye is on treatment for glaucoma: dorzolamide BID, timolol BID, and lantanoprost. ESR, CRP slightly elevated.     8/13: pt dimming OS stable.     Past Medical History:   Diagnosis Date    Barrett's esophagus     BPH (benign prostatic hyperplasia)     ESRD needing dialysis     FAP (familial adenomatous polyposis)     HLD (hyperlipidemia)     HTN (hypertension)      Social History     Social History    Marital status: Divorced     Spouse name: N/A    Number of children: N/A    Years of education: N/A     Occupational History    Not on file.     Social History Main Topics    Smoking status: Not on file    Smokeless tobacco: Not on file    Alcohol use Not on file    Drug use: Not on file    Sexual activity: Not on file     Social History Narrative       History reviewed. No pertinent family history.    Medications:   b complex-vitamin c-folic acid  1 tablet Oral Daily    insulin glargine  23 Units Subcutaneous Nightly    insulin lispro  0-20 Units Subcutaneous TID WC    [START ON 03/25/2017] gabapentin  100 mg Oral Daily    dorzolamide  1 drop Left Eye 2 times per day    timolol  1 drop Left Eye 2 times per day    latanoprost  1 drop Left Eye Nightly       Allergies   Allergen Reactions    Metformin Diarrhea    Bactrim [Sulfamethoxazole-Trimethoprim] Other (See Comments)     weakness    Heparin Other (See  Comments)     Thrombocytopenia. Unclear severity. Per chart review, had a positive SRA    Lisinopril Other (See Comments)     Weakness, renal function changes       Review of systems, past medical, family, and social history as documented above. And been reviewed and confirmed with the patient.    Vision: LP OD / J1+ OS with correction  IOP: not able to measure due to pt discomfort / 12  by TP at 5:50 PM  Pupils: round and reactive OS, not able to assess APD    EOM: FULL OS  CVF: Full OS    Color: 4 / 14 HRR OS    Slit lamp exam:     OD OS       Orbit/adnexa/lids:       Clear, phthisis present   clear     Conj / sclera:     white and quiet  white and quiet     Cornea:     opaque   clear     Anterior Chamber:     No view   deep and quiet     Iris:     No view   radially symmetric     Lens:     No view   clear     Patient Dilated: with combo gtts (phenylephrine and tropicamide) at approximately 5:50 PM (dilation typically lasts 6 hours but may have effects up to 24)    Fundus exam:       OD OS   Disc:   No view pink with healthy rims, no disc edema, C/D ratio 0.8   Fundus:   No view macula / vitreous clear; periphery significant for patches of atrophy in superior and nasal field     Assessment and Plan:    Subjective Visual Disturbance  - VA/IOP wnl  - posterior exam concerning for enlarged cupping 0.8 and patches of retinal atrophy, but no baseline to compare to ([facility] [place])  - agree with neurology that course of progression over 1 wk suggests against vascular process  - Ddx for dimming vision: progression of glaucoma, NAION, PION, stroke  - will obtain OCT macular, OCT optic nerve head and visual field tmr.  - agree with MRI of head  - will follow while pt is inpatient    Please page ophthalmology with any questions or concerns.  Thank for your allowing [place] to participate in the care of this patient.    Ophthalmology Clinic Phone Number: [phone]     Patient seen on call. Attending attestation to  follow.    Authored by Erlinda Hong He, MD on 03/24/2017 at 5:50 PM     I have seen and examined the patient 8/13 and agree with residents assessment and plan as documented above. Patient poor historian - loss of vision OD and dimming OS x 1 week.  VA is reasonable and there is no clear acute process on exam.  Was seen in clinic the following day with findings consistent with advanced glaucoma.  See updated notes.    Phylliss Bob, MD

## 2017-03-24 NOTE — Plan of Care (Signed)
Problem: Impaired Ambulation  Goal: LTG - IMPROVE AMBULATION  Patient will ambulate 150-299 feet using least restrictive assistive device with No assist (Independently)    Time frame: 1-3 days    Problem: Impaired Stair Navigation  Goal: STG - IMPROVE STAIR NAVIGATION  Patient will navigate less than 4 steps with 1  rail(s) and least restrictive assistive device and No assist (Independently)     Time frame: 1-3 days

## 2017-03-24 NOTE — Progress Notes (Signed)
Medical care coordinator ED-patient seen ,he tells me we can't reach his daughter today because she is having a colonoscopy -[facility] aware   Patient reports he is weak and not able to go home without  assist  Physical therapy will be seeing patient   [phone]

## 2017-03-24 NOTE — Consults (Signed)
Hemodialysis Daily Progress  Note     Subjective:   Patient was seen on dialysis.   Tolerating the treatment, stable hemodynamics and afebrile  Denies any new symptoms      Physical Examination:    Current Vitals Vitals Range (24 Hours)   BP 98/52   Pulse 57   Temp 36.1 C (97 F)   Resp (!) 8   Ht 1.753 m (5\' 9" )   Wt 72.6 kg (160 lb)   SpO2 95%   BMI 23.63 kg/m2 BP: (80-112)/(40-63)   Temp:  [35.9 C (96.6 F)-37.9 C (100.2 F)]   Temp src: Temporal (08/13 0627)  Heart Rate:  [51-82]   Resp:  [8-18]   SpO2:  [92 %-100 %]        Intake/Output last 3 shifts:I/O last 3 completed shifts:  08/12 0700 - 08/13 0659  In: - (0 mL/kg)   Out: 1200 (16.5 mL/kg) [Stool:1200]  Net: -1200  Weight: 72.6 kg   Weight:   Last 4 Weights    03/21/17 1255   Weight: 72.6 kg (160 lb)       General: oriented to day  ENT: Mucus membranes are moist, there are no exudates, no lymphadenopathy or adenopathy.   CVS: S1, S2, Regular rate and rhythm, no rubs or murmur   Pulm: CTA bilaterally, there are no wheezes, mild crackles crackles.  Abd: Soft, non-tender, non-distended  Ext: no LE edema- RUE arm AVF with weak thrill but strong bruit  Neuro: CN II-XII are grossly intact, there are no focal neurological deficits, moves all 4 extremities.     Labs:    Recent Labs  Lab 03/24/17  0519 03/23/17  0326 03/22/17  0040   WBC 5.8 6.4 5.3   Hemoglobin 11.8* 10.9* 10.1*   Hematocrit 36* 33* 31*   Platelets 88* 82* 87*       Recent Labs  Lab 03/24/17  0519 03/23/17  0326 03/22/17  0040 03/21/17  1706   Sodium 135 135 135 130*   Potassium 5.1 4.8 5.1   5.1 7.5*   Chloride 93* 92* 91* 86*   CO2 23 27 29* 26   UN 48* 28* 28* 44*   Creatinine 10.54* 7.25* 7.96* 11.75*   Glucose 132* 109* 97 127*   Calcium 9.2 9.2 9.1 9.2   Albumin  --   --   --  4.5   Phosphorus 8.0* 6.2* 6.1*  --      Assessment/Plan:  Victor Flowers is a 71 y.o. Male with ESRD on HD MWF in [place], DM type 2, history of FAP, Barrett's esophagus, GERD, HLD, and chronic hip pain who  was brought to [facility] ED todday because of chills and AMS.    1. ESRD on HD for a year now in [place], on MWF per patient  - Dialysis parameters reviewed with RN  - Currently on dialysis with following prescription  - F180; 2K bath, Ca 2.5, BFR-max 400; total 4hrs UF 1-3 as tolerates  - Will continue dialysis as tolerates    2- Left eye vision problem that started this week  - Appreciate Neurology Assistance  - Follow recs    3- Fever and chills  + For Rhino virus  - Negative blood culture    4. Acid Base  - Normal Bicarb    5. Hyperkalemia and Hyponatremia  - Monitor    6. Volume/Blood Pressure  - BP stable    Author: Modena Morrow, MBBS  Note created: 03/24/2017  at: 1:48 PM

## 2017-03-24 NOTE — Progress Notes (Signed)
Report Given To   03/24/2017 Patient had a dialysis partial treatment today . Blood pressure low and soft though out treatment . No fluid removed patient is plus about 300 cc Danny Lawless, LPN       Descriptive Sentence / Reason for Admission   Duration of Treatment (minutes): 250 minutes  Complications: blood pressure low and soft though out         Active Issues / Relevant Events            Review of medications administered  such as routine meds, antibiotics, and heparin: No medications given   Vital Signs: blood pressure low and soft         To Do List  Medications still needing administration: none related to dialysis   Dressing change due : remove dressing on fistula later today about 6 hours post dialysis       Anticipatory Guidance / Discharge Planning  Date/Time of Next Treatment: nephology to evaluate
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
## 2017-03-24 NOTE — ED Notes (Signed)
Received report from previous RN, assuming care of the pt at this time. Will continue to monitor and treat per orders. Pt resting comfortably, no needs or concerns. Pt talking on the phone.

## 2017-03-24 NOTE — Progress Notes (Signed)
Writer contacted [facility] [phone]- patient has been receiving dialysis MWF 12:10p since 7/23. He is a "transient patient" who has slot in [place] but because he travelled here to stay with family he has been receiving treatment while here in [place]. Per Cristie Hem at [facility] his tx was scheduled to end 8/10 but now it has been extended for the next few weeks.    Writer updated [facility] that patient has outpt dialysis slot MWF 12:10p at [facility]. Writer still waiting for return call from daughter at this time    Chanda Busing, [place]

## 2017-03-24 NOTE — ED Notes (Signed)
Pt to dialysis.

## 2017-03-24 NOTE — Progress Notes (Addendum)
Medicine care coordinator ED-provider team requesting medication list from patient's PCP office in [facility] called office of Dr.Myers [facility] ]and put in verbal request  and waiting call back if they want authorization form faxed and where to send [need fax #]   [phone]  Called back -writer sent authorization signed by patient to [phone] [facility]

## 2017-03-24 NOTE — ED Notes (Signed)
Report Given To  Alex, RN      Descriptive Sentence / Reason for Admission   71 y/o male presents to the ED with c/o chills, cough, fatigue and vision changes for the past few days; MWF dialysis patient; last tx on Monday.       Active Issues / Relevant Events   Febrile in triage - now afebrile   K 7.5--> 5.1 --> 4.8  Creatinine 11.75 --> 7.96  + for parainfluenza 3 virus       To Do List  VS/Assess  Dialysis  Low K diet  Wound/ostomy to see regarding ileostomy      Anticipatory Guidance / Discharge Planning  Admitted to medicine for hyperkalemia
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
## 2017-03-24 NOTE — Progress Notes (Signed)
Writer left another message for daughter requesting return call.    Chanda Busing, [place]

## 2017-03-25 DIAGNOSIS — H53143 Visual discomfort, bilateral: Secondary | ICD-10-CM

## 2017-03-25 DIAGNOSIS — R4182 Altered mental status, unspecified: Secondary | ICD-10-CM

## 2017-03-25 DIAGNOSIS — J122 Parainfluenza virus pneumonia: Secondary | ICD-10-CM

## 2017-03-25 DIAGNOSIS — H40003 Preglaucoma, unspecified, bilateral: Secondary | ICD-10-CM

## 2017-03-25 LAB — BASIC METABOLIC PANEL
Anion Gap: 17 — ABNORMAL HIGH (ref 7–16)
CO2: 22 mmol/L (ref 20–28)
Calcium: 8.5 mg/dL — ABNORMAL LOW (ref 8.6–10.2)
Chloride: 97 mmol/L (ref 96–108)
Creatinine: 9.75 mg/dL — ABNORMAL HIGH (ref 0.67–1.17)
GFR,Black: 6 * — AB
GFR,Caucasian: 5 * — AB
Glucose: 107 mg/dL — ABNORMAL HIGH (ref 60–99)
Lab: 39 mg/dL — ABNORMAL HIGH (ref 6–20)
Potassium: 4.8 mmol/L (ref 3.3–5.1)
Sodium: 136 mmol/L (ref 133–145)

## 2017-03-25 LAB — CBC AND DIFFERENTIAL
Baso # K/uL: 0.1 10*3/uL (ref 0.0–0.1)
Basophil %: 1.7 %
Eos # K/uL: 0.2 10*3/uL (ref 0.0–0.5)
Eosinophil %: 3.4 %
Hematocrit: 34 % — ABNORMAL LOW (ref 40–51)
Hemoglobin: 11 g/dL — ABNORMAL LOW (ref 13.7–17.5)
Lymph # K/uL: 1.4 10*3/uL (ref 1.3–3.6)
Lymphocyte %: 25.9 %
MCH: 32 pg/cell (ref 26–32)
MCHC: 33 g/dL (ref 32–37)
MCV: 96 fL — ABNORMAL HIGH (ref 79–92)
Mono # K/uL: 0.4 10*3/uL (ref 0.3–0.8)
Monocyte %: 7.8 %
Neut # K/uL: 3.1 10*3/uL (ref 1.8–5.4)
Nucl RBC # K/uL: 0 10*3/uL (ref 0.0–0.0)
Nucl RBC %: 0 /100 WBC (ref 0.0–0.2)
Platelets: 90 10*3/uL — ABNORMAL LOW (ref 150–330)
RBC: 3.5 MIL/uL — ABNORMAL LOW (ref 4.6–6.1)
RDW: 15.3 % — ABNORMAL HIGH (ref 11.6–14.4)
Seg Neut %: 60.3 %
WBC: 5.2 10*3/uL (ref 4.2–9.1)

## 2017-03-25 LAB — PHOSPHORUS: Phosphorus: 6.9 mg/dL — ABNORMAL HIGH (ref 2.7–4.5)

## 2017-03-25 LAB — EKG 12-LEAD
P: 0 deg
PR: 205 ms
QRS: -74 deg
QRSD: 142 ms
QT: 380 ms
QTc: 401 ms
Rate: 67 {beats}/min
Severity: ABNORMAL
T: 254 deg

## 2017-03-25 LAB — DIFF MANUAL
Diff Based On: 116 CELLS
Myelocyte %: 1 % — ABNORMAL HIGH (ref 0–0)

## 2017-03-25 LAB — POCT GLUCOSE
Glucose POCT: 128 mg/dL — ABNORMAL HIGH (ref 60–99)
Glucose POCT: 125 mg/dL — ABNORMAL HIGH (ref 60–99)
Glucose POCT: 130 mg/dL — ABNORMAL HIGH (ref 60–99)
Glucose POCT: 191 mg/dL — ABNORMAL HIGH (ref 60–99)
Glucose POCT: 136 mg/dL — ABNORMAL HIGH (ref 60–99)

## 2017-03-25 MED ORDER — CALCIUM CARBONATE ANTACID 500 MG PO CHEW *I*
1000.0000 mg | CHEWABLE_TABLET | Freq: Three times a day (TID) | ORAL | Status: DC | PRN
Start: 2017-03-25 — End: 2017-03-27
  Administered 2017-03-25 – 2017-03-27 (×3): 1000 mg via ORAL
  Filled 2017-03-25 (×3): qty 2
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
## 2017-03-25 NOTE — Progress Notes (Addendum)
Occupational Therapy      Aware of OT order, chart reviewed, and eval attempted. Pt is currently off the unit at eye clinic. Will re-attempt as able. Thank you.    Addendum: Second attempt made for eval at 10:55am. Pt currently eating breakfast, declining at this time requesting to finish breakfast. Will continue to re-attempt as time allows.      Darrol Jump OTR/L  Pager [phone]    Please contact the OT pager [facility] [phone]) for all questions/concerns and/or update requests.
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
## 2017-03-25 NOTE — Plan of Care (Addendum)
Psychosocial     Demonstrates ability to cope with illness Goal not met          Cognitive function     Cognitive function will be maintained or return to baseline Progressing towards goal        Mobility     Functional status is maintained or improved - Geriatric Progressing towards goal        Nutrition     Nutritional status is maintained or improved - Geriatric Progressing towards goal        Pain/Comfort     Patient's pain or discomfort is manageable Progressing towards goal        Safety     Patient will remain free of falls Progressing towards goal            Assumed pt care at 1900. No complaints of pain at the start of shift. Pt complaining continuously that "he just can't do this anymore." Pt received 23u of lantus overnight, monitoring for hypoglycemia. Midnight BG was 88, APP notified. Pt drank ginger ale and ate crackers. D5 at 47m/hr for 6 hours was ordered. Recheck BG was 128 at 0145. After 0200 pt called nurse into room crying due to pain stating "I'm going to rip this IV out" (IV is placed in neck). APP notified, fluids were stopped. No signs of IV irritation and flushes fine. Plans to leave IV in place at this time. Pt crying in bed and eventually fell asleep. 0400 BG was 125, also stating neck IV feels better now but took hours to "calm down." Will continue to monitor.      AMelvenia Beam RN

## 2017-03-25 NOTE — Progress Notes (Signed)
CTSP: Nursing concerned BG has been trending down its now 88. Dr. [place] mentioned he was concerned patient might have hypoglycemia.     A/P  Noted patient is alert and oriented x3, asymptomatic, walking around the unit and has been offered a snack by the nurse    Hypoglycemia    1. D5 ggt started at  73ml/hr x6 hours   2. Frequent bg checks ( next check 0200)

## 2017-03-25 NOTE — Consults (Signed)
Ophthalmology Consult      Patient name: Victor Flowers  DOB: 05-Nov-1945       Age: 71 y.o.  MR#: [id number]    Date: 03/25/2017    Reason for consult: Dimming vision in left eye    HPI:   Victor Flowers is a 71 yo male w/ hx glaucoma, cataracts, ostomy 2/2 polyposis, DM, ESRD on HD presents with chills, cough, congestion and fatigue worsening over the past couple of days, in the setting of missed last HD.  He c/o intermittent mild dimming of vision in L eye gradually worsening in the past week.  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  His left eye is on treatment for glaucoma: dorzolamide BID, timolol BID, and lantanoprost. ESR, CRP slightly elevated.     8/13: pt dimming OS stable.   8/14: on review of history states he can no longer see well enough at night to drive. Feels like vision is darker, not blurry. No pain.     Past Medical History:   Diagnosis Date    Barrett's esophagus     BPH (benign prostatic hyperplasia)     ESRD needing dialysis     FAP (familial adenomatous polyposis)     HLD (hyperlipidemia)     HTN (hypertension)      Social History     Social History    Marital status: Divorced     Spouse name: N/A    Number of children: N/A    Years of education: N/A     Occupational History    Not on file.     Social History Main Topics    Smoking status: Not on file    Smokeless tobacco: Not on file    Alcohol use Not on file    Drug use: Not on file    Sexual activity: Not on file     Social History Narrative       History reviewed. No pertinent family history.    Medications:   b complex-vitamin c-folic acid  1 tablet Oral Daily    insulin lispro  0-20 Units Subcutaneous TID WC    gabapentin  100 mg Oral Daily    dorzolamide  1 drop Left Eye 2 times per day    timolol  1 drop Left Eye 2 times per day    latanoprost  1 drop Left Eye Nightly       Allergies   Allergen Reactions    Metformin Diarrhea    Bactrim  [Sulfamethoxazole-Trimethoprim] Other (See Comments)     weakness    Heparin Other (See Comments)     Thrombocytopenia. Unclear severity. Per chart review, had a positive SRA    Lisinopril Other (See Comments)     Weakness, renal function changes       Review of systems, past medical, family, and social history as documented above. And been reviewed and confirmed with the patient.    Vision: LP OD / J1 20/25-1 OS with correction  IOP: not able to measure due to pt discomfort / 10  by TP at 8:50 AM  Pupils: round and reactive OS, not able to assess APD    EOM: FULL OS  CVF: Full OS    Color: 4.5 / 14 HRR OS    Slit lamp exam:     OD OS       Orbit/adnexa/lids:       Clear, phthisis present  clear     Conj / sclera:     white and quiet   white and quiet     Cornea:     opaque   clear     Anterior Chamber:     No view   deep and quiet     Iris:     No view   radially symmetric     Lens:     No view   PCIOL     Patient Dilated: with combo gtts (phenylephrine and tropicamide) at approximately 8:50 AM (dilation typically lasts 6 hours but may have effects up to 24)    Fundus exam:       OD OS   Disc:   No view Mild pallor inferiorly, no disc edema, C/D ratio 0.8   Fundus:   No view Pigmentary changes in macula,  periphery significant for patches of atrophy in superior and nasal field with prior focal laser     Assessment and Plan:    Subjective Visual Disturbance  - Visual acuity great, IOP normal  - Fundus exam with enlarged cupping 0.8 and patches of retinal atrophy  - OCT shows significant RNFL loss, macular thinning and overall retinal thinning  - HVF shows arcuate defect in superior and inferior fields  - It is difficult to attribute his recent visual changes with specific diagnosis without understanding of his baseline   - Patient unable to remember name of ophthalmologist in [place]  - Course of progression over 1 wk suggests against vascular process  - Reports fatigue, but otherwise no specific GCA complaints,  however ESR/CRP are mildly elevated  - Ddx for dimming vision: progression of glaucoma, NAION, PION, stroke, GCA  - Agree with MRI head  - will follow while pt is inpatient    Recommendations:  - Temporal artery biopsy, although GCA low suspicion  - Do not recommend starting steroids for GCA given diabetes and other comorbidities  - If able to obtain ophthalmology records with help of daughter would help in diagnosis  - Will follow MRI results  - Continue glaucoma medications left eye  - If returning to [place] should follow up immediately with ophthalmologist there  - We will continue to follow in hospital    Please page ophthalmology with any questions or concerns.  Thank for your allowing [place] to participate in the care of this patient.    Ophthalmology Clinic Phone Number: [phone]     Patient seen on call. Attending attestation to follow.    Authored by Glory Rosebush, MD on 03/25/2017 at 8:50 AM

## 2017-03-25 NOTE — Progress Notes (Signed)
PT treatment Note    Victor Flowers currently requires stand by assistance for mobility. Anticipate patient will need 1 more PT visit for stairs to allow return to prior living environment. Discharge Recommendations: (P) Anticipate return to prior living arrangement after discharge from the hospital.     PT Discharge Equipment Recommended: (P) None     03/25/17 1500   PT [place]   Visit Number   Visit Number [facility]) / Treatment Day (HH) 2   Precautions/Observations   Precautions used Yes   LDA Observation None   Fall Precautions General falls precautions   Other Cooperative. Dtr visiting with her children   Pain Assessment   *Is the patient currently in pain? Denies   Cognition   Arousal/Alertness Appropriate responses to stimuli   Orientation A&Ox3   Following Commands Follows simple commands without difficulty   Safety Judgement Impaired   Additional Comments Patient cooperative. Insists he is going to drive self back to [place] but then talks about his poor vision so questional judgement for safety with driving   Bed Mobility   Bed mobility Tested   Supine to Sit Independent   Sit to Supine Independent   Transfers   Transfers Tested   Stand Pivot Transfers Independent   Sit to Stand Independent   Stand to sit Independent   Transfer Assistive Device none   Mobility   Mobility Tested   Gait Pattern Decreased cadence;Unsteady   Ambulation Assist 1 person assist;Contact guard   Ambulation Distance (Feet) 200'   Ambulation Assistive Device None   Stairs Assistance Not performed  (patient declined d/t visitors)   Additional comments Patient reports feeling stronger than yesterday, but still unsteady , especially when turning or trying to walk and carry on conversation. Dtr reports he will have a flight of stairs to get to BR   Balance   Balance Tested   Sitting - Static Independent    Sitting - Dynamic Independent   Standing - Static Independent   Standing - Dynamic Contact guard   PT AM-PAC Mobility    Turning over in bed? 4   Sitting down on and standing up from a chair with arms? 4   Moving from lying on back to sitting on the side of the bed? 4   Moving to and from a bed to a chair? 4   Need to walk in hospital room? 3   Climbing 3 - 5 steps with a railing? 3  (clinical judgement)   Total Raw Score 22   Assessment   Brief Assessment Remains appropriate for skilled therapy   Problem List Impaired balance;Visual deficit   Additional Comments Patient is doing better but still demonstrates some issues with his balance. He is insisting on driving at d/c but unsure if that is safe given his visual deficit   Plan/Recommendation   Treatment Interventions Restorative PT;Assess functional mobility;AROM;Balance training;Stair training;Pt/Family education;Strengthening;Gait training;D/C planning;Will work to minimize pain while promoting mobility whenever possible   PT Frequency 2-4x/wk   Hospital Stay Recommendations Out of bed with nursing assist;Ambulate daily with nursing assist   Discharge Recommendations Anticipate return to prior living arrangement   PT Discharge Equipment Recommended None   Assessment/Recommendations Reviewed With: Care coordinator;Family;Nursing;Patient;Physician;Social Worker   Next Visit stairs   Time Calculation   PT Timed Codes 17   PT Untimed Codes 0   PT Unbilled Time 0   PT Total Treatment 17   Plan and Onset date  Plan of Care Date 03/24/17   Onset Date 03/21/17   Treatment Start Date 03/24/17   PT Charges   $PT [facility] Charges Gait Training - code 0208 (86min)   Crystina Borrayo A. Ronney Lion, PT  Pager [phone]
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
## 2017-03-25 NOTE — Progress Notes (Signed)
Pt was able to remember where his eye surgeries were performed and wanted me to relay the message to the team. He's had them completed at the [facility]. Loyola Mast, RN

## 2017-03-25 NOTE — Continuity of Care (Addendum)
Acute Care Coordinator / Discharge Planning:    Per provider notes 03/25/2017:  Victor Flowers is a 71 y.o. Male  with ESRD on HD MWF in [place], DM type 2, history of FAP, Barrett's esophagus, GERD, HLD, and chronic hip pain who was brought to [facility] ED todday because of chills and AMS.    ACC discharge planning:   Discussed discharge during morning rounds with interdisciplinary team, patient not medically ready for discharge. Writer spoke to patient regarding discharge planning. Patient states he is going back to [place] and will not be staying in [place]. 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 Patient is currently doing dialysis at [facility]. Discussed home care services, patient adamantly refuses home care services and does not want to discuss further.     Acute Care Coordinator met with patient/family at bedside to discuss as below:    Home Set Up/Lives With: Patient is staying with his daughter  Current DME: None  Home O2/CPAP/BiPAP: n/a  Medication Management:  Self  Transportation:  Patient drives to any medical appointments  Home Care: Refused    Plan/Dispo: Discussed discharge during morning rounds with interdisciplinary team, patient is medically ready for discharge. Patient is refusing and PCP follow up appointments her in the area. He will follow up with his PCP when he returns to [facility]. Patient has a slot at [facility] on [address]. He will resume dialysis tomorrow at 12:10 PM. Patient has a biopsy scheduled tomorrow at [facility]. He will need to call this afternoon to schedule time after discharge.     I will continue to follow patient progress for discharge planning. Please call with any questions or concerns. Thank you!    Lucy Antigua, RN   Acute Care Coordinator/Home Care Liaison    Phone [phone]  Pager [phone]

## 2017-03-25 NOTE — Progress Notes (Signed)
Dialysis progress note 03/25/2017 12:15 PM    Subjective:  No acute events overnight. Patient seen and examined in room. Resting comfortably in bed. [facility]. Denies S)B or chest pain. But, does not know why he is here.     Objective:  Vitals:    03/24/17 1838 03/24/17 2306 03/25/17 0600 03/25/17 0700   BP: 96/50 122/59  117/58   BP Location: Left arm Left arm  Right arm   Pulse: 78 58  59   Resp: 16 17 16 18    Temp: 37 C (98.6 F) 36.4 C (97.5 F)  36.6 C (97.9 F)   TempSrc: Temporal Temporal  Temporal   SpO2: 95% 96%  93%   Weight:       Height:         Intake/Output last 3 shifts:I/O last 3 completed shifts:  08/13 0700 - 08/14 0659  In: 1103.3 (15.2 mL/kg) [I.V.:1103.3 (0.6 mL/kg/hr)]  Out: 1233 (17 mL/kg) [Urine:100 (0.1 mL/kg/hr); Other:733; Stool:400]  Net: -129.7  Weight: 72.6 kg     Gen: Well developed male in NAD  Eyes: anicteric sclerae  Mouth: Moist and pink   CVS: RRR, no rub, no gallop, no murmur   Lungs: Clear anterolaterally   Abd: Soft non tender non distended, + BS  Ext: Edema none   RUE AVF with + thrill (weak) and bruit (strong)   Neuro: Awake Alert       Recent Labs  Lab 03/25/17  0625 03/24/17  0519 03/23/17  0326  03/21/17  1706   Sodium 136 135 135  < > 130*   Potassium 4.8 5.1 4.8  < > 7.5*   Chloride 97 93* 92*  < > 86*   CO2 22 23 27   < > 26   UN 39* 48* 28*  < > 44*   Creatinine 9.75* 10.54* 7.25*  < > 11.75*   Glucose 107* 132* 109*  < > 127*   Calcium 8.5* 9.2 9.2  < > 9.2   Albumin  --   --   --   --  4.5   Phosphorus 6.9* 8.0* 6.2*  < >  --    < > = values in this interval not displayed.  No results found for: PTH, VID25   Recent Labs  Lab 03/25/17  0625 03/24/17  0519 03/23/17  0326   WBC 5.2 5.8 6.4   Hemoglobin 11.0* 11.8* 10.9*   Hematocrit 34* 36* 33*   Platelets 90* 88* 82*         Component Value Date/Time    FE 29 (L) 03/22/2017 0040    IBC 233 (L) 03/22/2017 0040    FESAT 12 (L) 03/22/2017 0040    FER 1000 (H) 03/22/2017 0040        US  Carotid Doppler Bilateral    Result Date: 03/23/2017  Based on gray scale, and color Doppler appearance; along with peak systolic velocities, there is no hemodynamically significant stenosis of the bilateral internal carotid arteries. This interpretation is based upon the criteria adopted by the Society of Radiologists in Ultrasound consensus conference. END OF IMPRESSION. [facility] submits this DICOM format image data and final report to the [facility], an independent secure electronic health information exchange, on a reciprocally searchable basis (with patient authorization) for a minimum of 12 months after exam date.            Assessment/Plan  Victor Flowers is a 71 y.o.  Male  with ESRD on HD MWF in [place], DM type 2, history of FAP, Barrett's esophagus, GERD, HLD, and chronic hip pain who was brought to [facility] ED todday because of chills and AMS.    1. ESRD on HD for a year now in [place], on MWF per patient:   - No acute indication for dialysis today. HD tomorrow   - Monitor chemistry   - Dose adjust medications for CrCl < 10   - Nephrovite     2. Anemia: Goal hemoglobin 10-11 g/dl   Hgb within goal range   Follow CBC     3. CKD-MBD:      Calcium as above, acceptable   Hyperphosphatemia - On no phos binders. Please repeat phosphate level tomorrow, if remains elevated will need to start phos binders   Monitor calcium phosphorus noted    4. Volume/Blood Pressure:   BP as above, acceptable   Monitor    5. Access:  - AVF     6. Left eye vision problem that started this week  - Neurology consulted   - Opthal consulted - Seen in Eye clinic this morning     Thank you for allowing [place] to participate in the care of this patient  Please call with questions, attending recommendations to follow.     Author: Eugenio Hoes, NP  as of: 03/25/2017  at: 12:15 PM   Pager [phone]

## 2017-03-25 NOTE — Consults (Signed)
[facility] Social Work Collateral Note    [name] - Daughter - [phone]  Writer called pt's daughter, with his permission, to obtain collateral in regards to any safety concerns.  [name] reported that pt had come to [place] a few weeks ago without notice from [place].  She reported that he had called her and told her to look out her window because he was there.  She stated that he drove up by himself which he shouldn't have done due to vision issues.  She reported that a neighbor in [place] helps take care of pt and that neighbor had recently told her that he struggles with depression at times which can lead to him becoming angry.  [name] reported that her parents have been divorced for about 72 years and that he recently started coming back around in her life.  She feels that he is experiencing "end of life guilt" and is trying to make up for lost time.  [name] reported that she knows that his health is not well and she stated that if pt wanted, he could stay in [place] in a home that she owns right up the street.  She stated that the neighbor recently told her that pt said he was going to drive back to [place] soon.  [name] does not want him driving back and wants him to stay in [place] so the family can take care of him.  [name] reported that she knows that pt saw a psychiatrist many years ago, but does not know of any psychiatric diagnoses and has not had any safety concerns.  She is agreeable to him returning to her home.    Germaine Pomfret, LMSW

## 2017-03-25 NOTE — Comprehensive Assessment (Signed)
[facility] Social Work Assessment     03/25/17 1218   Demographics   Religious/Cultural Factors None   [place]  (Pt currently residing in [place] with daughter, typically lives in [place])   Marital status Divorced   Ethnicity/Race Caucasian   Is patient a primary care giver? No   Primary Language English   Primary Care Taker of? 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  No   Is the patient presumed eligible for OPWDD services? No   Psychosocial Risk Factors   Resides out of county Yes  (Pt resides in [place] most of the year)   SW Psych Alcohol Assessment   Alcohol Assessment Male   Male: How many times in the past year have you had 5 or more drinks in a day? None   OTHER   Strengths (pick two) Ability to ID reasons for living;Supportive relationships;Future oriented        03/25/17 1221   Current Treatment Providers   Psychiatrist No   Therapist No   Case manager No   Other treatment providers None   Psychiatric History   Previous Diagnoses None   History of suicide attempts None   History of Non-Suicidal Self Injury No   History of violence None   Psychiatric hospitalizations None   CPEP/Psych ED visits None   History of abuse or trauma None   Legal history None   Is the patient currently in the [place] military or has been on active duty in the past? No   Family psychiatric history Unknown     Please see collateral note with pt's daughter, Marzetta Board.    Plan  [facility] SW will continue to follow-up with any psychiatric/psychosocial concerns.  Refer to Unit SW for discharge planning needs.    Germaine Pomfret, LMSW  PIC: [phone]

## 2017-03-25 NOTE — Progress Notes (Signed)
Patient is scheduled for HD tomorrow 03/26/17 in the am. Please weigh patient before 7 am and serve early breakfast (if indicated) if possible. Thank you.      Nalina Yeatman, RN

## 2017-03-26 LAB — CBC AND DIFFERENTIAL
Baso # K/uL: 0 10*3/uL (ref 0.0–0.1)
Basophil %: 0 %
Eos # K/uL: 0.4 10*3/uL (ref 0.0–0.5)
Eosinophil %: 7.1 %
Hematocrit: 32 % — ABNORMAL LOW (ref 40–51)
Hemoglobin: 10.8 g/dL — ABNORMAL LOW (ref 13.7–17.5)
Lymph # K/uL: 1.7 10*3/uL (ref 1.3–3.6)
Lymphocyte %: 27.4 %
MCH: 32 pg/cell (ref 26–32)
MCHC: 34 g/dL (ref 32–37)
MCV: 94 fL — ABNORMAL HIGH (ref 79–92)
Mono # K/uL: 0.4 10*3/uL (ref 0.3–0.8)
Monocyte %: 7.1 %
Neut # K/uL: 3.5 10*3/uL (ref 1.8–5.4)
Nucl RBC # K/uL: 0 10*3/uL (ref 0.0–0.0)
Nucl RBC %: 0 /100 WBC (ref 0.0–0.2)
Platelets: 96 10*3/uL — ABNORMAL LOW (ref 150–330)
RBC: 3.4 MIL/uL — ABNORMAL LOW (ref 4.6–6.1)
RDW: 15.1 % — ABNORMAL HIGH (ref 11.6–14.4)
Seg Neut %: 57.5 %
WBC: 6 10*3/uL (ref 4.2–9.1)

## 2017-03-26 LAB — DIFF MANUAL
Diff Based On: 113 CELLS
React Lymph %: 1 % (ref 0–6)

## 2017-03-26 LAB — POCT GLUCOSE
Glucose POCT: 127 mg/dL — ABNORMAL HIGH (ref 60–99)
Glucose POCT: 147 mg/dL — ABNORMAL HIGH (ref 60–99)
Glucose POCT: 185 mg/dL — ABNORMAL HIGH (ref 60–99)
Glucose POCT: 202 mg/dL — ABNORMAL HIGH (ref 60–99)

## 2017-03-26 LAB — BASIC METABOLIC PANEL
Anion Gap: 20 — ABNORMAL HIGH (ref 7–16)
CO2: 21 mmol/L (ref 20–28)
Calcium: 8.7 mg/dL (ref 8.6–10.2)
Chloride: 93 mmol/L — ABNORMAL LOW (ref 96–108)
Creatinine: 12.71 mg/dL — ABNORMAL HIGH (ref 0.67–1.17)
GFR,Black: 4 * — AB
GFR,Caucasian: 3 * — AB
Glucose: 153 mg/dL — ABNORMAL HIGH (ref 60–99)
Lab: 61 mg/dL — ABNORMAL HIGH (ref 6–20)
Potassium: 4.9 mmol/L (ref 3.3–5.1)
Sodium: 134 mmol/L (ref 133–145)

## 2017-03-26 LAB — PHOSPHORUS: Phosphorus: 9 mg/dL (ref 2.7–4.5)

## 2017-03-26 MED ORDER — MIDODRINE HCL 5 MG PO TABS *I*
ORAL_TABLET | ORAL | Status: AC
Start: 2017-03-26 — End: 2017-03-26
  Administered 2017-03-26: 5 mg via ORAL
  Filled 2017-03-26: qty 1

## 2017-03-26 MED ORDER — MIDODRINE HCL 5 MG PO TABS *I*
5.0000 mg | ORAL_TABLET | Freq: Once | ORAL | Status: AC
Start: 2017-03-26 — End: 2017-03-26
  Administered 2017-03-26: 5 mg via ORAL
  Filled 2017-03-26: qty 1
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
## 2017-03-26 NOTE — Progress Notes (Signed)
Report Given To  03/26/17  Pt had HD today. Hypotensive throughout tx, given midodrine 1/2 way through without much effect.        Descriptive Sentence / Reason for Admission   Duration of Treatment (minutes): 818 minutes  Complications: Hypotensive- bolused a few times, gave medication, repositioned.        Active Issues / Relevant Events      Review of medications administered  such as routine meds, antibiotics, and heparin: Midodrine   Vital Signs: see flowsheet        To Do List  Medications still needing administration: None HD related  Dressing change due : May remove dressing at [facility]      Anticipatory Guidance / Discharge Planning  Date/Time of Next Treatment: 03/28/17    Please weigh patient daily to monitor fluid balance. Thank you.    Garey Ham, RN

## 2017-03-26 NOTE — Consults (Signed)
Hemodialysis Daily Progress  Note     Subjective:   Patient was seen on dialysis.   Tolerating the treatment, Sot BP and UF 1 liter and afebrile  Denies any new symptoms      Physical Examination:    Current Vitals Vitals Range (24 Hours)   BP 93/55   Pulse 54   Temp 36.4 C (97.5 F)   Resp 18   Ht 1.753 m (5' 9.02")   Wt 68.8 kg (151 lb 11.2 oz)   SpO2 100%   BMI 22.39 kg/m2 BP: (93-125)/(48-61)   Temp:  [35.6 C (96.1 F)-37.1 C (98.8 F)]   Temp src: Temporal (08/15 0947)  Heart Rate:  [54-65]   Resp:  [16-18]   SpO2:  [91 %-100 %]   Weight:  [68.8 kg (151 lb 11.2 oz)]        Intake/Output last 3 shifts:I/O last 3 completed shifts:  08/14 0700 - 08/15 0659  In: 360 (5 mL/kg) [P.O.:360]  Out: 25 (0.3 mL/kg) [Urine:25 (0 mL/kg/hr)]  Net: 335  Weight: 72.6 kg   Weight:   Last 4 Weights    03/21/17 1255 03/24/17 1532 03/26/17 0756   Weight: 72.6 kg (160 lb) 72.6 kg (160 lb) 68.8 kg (151 lb 11.2 oz)       General: oriented to day  ENT: Mucus membranes are moist, there are no exudates, no lymphadenopathy or adenopathy.   CVS: S1, S2, Regular rate and rhythm, no rubs or murmur   Pulm: CTA bilaterally, there are no wheezes, mild crackles crackles.  Abd: Soft, non-tender, non-distended  Ext: no LE edema- RUE arm AVF with weak thrill but strong bruit  Neuro: CN II-XII are grossly intact, there are no focal neurological deficits, moves all 4 extremities.     Labs:    Recent Labs  Lab 03/26/17  [phone] 03/25/17  0625 03/24/17  0519   WBC 6.0 5.2 5.8   Hemoglobin 10.8* 11.0* 11.8*   Hematocrit 32* 34* 36*   Platelets 96* 90* 88*       Recent Labs  Lab 03/26/17  0633 03/25/17  0625 03/24/17  0519  03/21/17  1706   Sodium 134 136 135  < > 130*   Potassium 4.9 4.8 5.1  < > 7.5*   Chloride 93* 97 93*  < > 86*   CO2 21 22 23   < > 26   UN 61* 39* 48*  < > 44*   Creatinine 12.71* 9.75* 10.54*  < > 11.75*   Glucose 153* 107* 132*  < > 127*   Calcium 8.7 8.5* 9.2  < > 9.2   Albumin  --   --   --   --  4.5   Phosphorus 9.0* 6.9* 8.0*   < >  --    < > = values in this interval not displayed.  Assessment/Plan:  Victor Flowers is a 71 y.o. Male with ESRD on HD MWF in [place], DM type 2, history of FAP, Barrett's esophagus, GERD, HLD, and chronic hip pain who was brought to [facility] ED todday because of chills and AMS.    1. ESRD on HD for a year now in [place], on MWF per patient  - Dialysis parameters reviewed with RN  - Currently on dialysis with following prescription  - F180; 2K bath, Ca 2.5, BFR-max 400; total 4hrs UF 1-3 as tolerates  - Will continue dialysis as tolerates  - Can add midodrine 5 mg 30 minutes  prior to dialysis    2- Left eye vision problem that started this week  - Appreciate Neurology Assistance  - Follow recs    3- Fever and chills  + For Rhino virus  - Negative blood culture    4. Acid Base  - Normal Bicarb    5. Hyperkalemia and Hyponatremia  - Monitor    6. Volume/Blood Pressure  - BP stable    Author: Modena Morrow, MBBS  Note created: 03/26/2017  at: 10:18 AM

## 2017-03-26 NOTE — Progress Notes (Signed)
Writer contacted [facility] [phone] - patient has been receiving dialysis MWF @ 12:10pm. Fraser Din at [facility] confirmed his slot was scheduled to end 8/10 but now it has been extended for the next few weeks. Writer shared that patient will be discharged from the hospital and will resume with [facility] on Friday (8/17).     [place], [place]

## 2017-03-26 NOTE — Progress Notes (Signed)
Pt refused most of care from writer after he came back from dialysis. He is fixated on yesterday when he was left for an hour at the eye clinic waiting for transportation and not getting breakfast this morning. He has a flat affect and keeps stating to writer "you don't understand and you don't realize how tired dialysis makes me." Pt stated " I don't feel like doing anything and want to be left alone to sleep." Pt refused vitals after return from HD and refused removal of IV in neck. Writer tried to help OT with pt participation, however it took pt 10-15 minutes to sit up for examination after multiple complaints. He was non-compliant and Probation officer believes he will be an unsafe discharge. Will continue to monitor. Loyola Mast, RN

## 2017-03-26 NOTE — Progress Notes (Signed)
Occupational Therapy      Re-attempt for OT eval made. Pt in process of transferring off unit with transportation for HD. Will continue to re-attempt as able. Thank you.        Darrol Jump OTR/L  Pager [phone]    Please contact the OT pager [facility] [phone] for all questions/concerns and/or update requests.

## 2017-03-26 NOTE — Plan of Care (Signed)
Cognitive function     Cognitive function will be maintained or return to baseline Maintaining        Mobility     Functional status is maintained or improved - Geriatric Maintaining        Nutrition     Nutritional status is maintained or improved - Geriatric Maintaining        Pain/Comfort     Patient's pain or discomfort is manageable Maintaining        Psychosocial     Demonstrates ability to cope with illness Maintaining        Safety     Patient will remain free of falls Maintaining          Assumed care from 2300-0700. Patient AOx3 on RA. Patient denies any pain. Patient awake during the night and walking around the nurses station at times. Will continue to monitor and treat per order.  [place], RN

## 2017-03-26 NOTE — Progress Notes (Addendum)
Hospital Medicine APP Service NP Progress Note:    Pt seen earlier today w/ Dr. Harriet Masson, chart/meds reviewed. Care/plan discussed with Dr. Harriet Masson -- please refer to this note for complete exam, labs, problem list and plan.     Pt stated repeatedly during the visit that he was tired -- "if I can just sleep for two hours, I'll be fine." Subsequently refused to fully work w/ OT to complete this evaluation required for discharge.     Updates:  D/C deferred until tomorrow   For hyperphosphotemia, have started PhosLo.     Samara Snide, NP  Page [phone]  Signed at 03/26/2017, 8:10 PM      Vitals:    03/26/17 1200 03/26/17 1230 03/26/17 1255 03/26/17 1300   BP: (!) 86/52 92/52 92/52  110/51   BP Location:       Pulse:       Resp:    18   Temp:    36.5 C (97.7 F)   TempSrc:       SpO2: 97% 100% 100% 100%   Weight:       Height:           Recent Results (from the past 24 hour(s))   Basic metabolic panel    Collection Time: 03/26/17  6:33 AM   Result Value Ref Range    Glucose 153 (H) 60 - 99 mg/dL    Sodium 134 133 - 145 mmol/L    Potassium 4.9 3.3 - 5.1 mmol/L    Chloride 93 (L) 96 - 108 mmol/L    CO2 21 20 - 28 mmol/L    Anion Gap 20 (H) 7 - 16    UN 61 (H) 6 - 20 mg/dL    Creatinine 12.71 (H) 0.67 - 1.17 mg/dL    GFR,Caucasian 3 (!) *    GFR,Black 4 (!) *    Calcium 8.7 8.6 - 10.2 mg/dL   CBC and differential    Collection Time: 03/26/17  6:33 AM   Result Value Ref Range    WBC 6.0 4.2 - 9.1 THOU/uL    RBC 3.4 (L) 4.6 - 6.1 MIL/uL    Hemoglobin 10.8 (L) 13.7 - 17.5 g/dL    Hematocrit 32 (L) 40 - 51 %    MCV 94 (H) 79 - 92 fL    MCH 32 26 - 32 pg/cell    MCHC 34 32 - 37 g/dL    RDW 15.1 (H) 11.6 - 14.4 %    Platelets 96 (L) 150 - 330 THOU/uL    Seg Neut % 57.5 %    Lymphocyte % 27.4 %    Monocyte % 7.1 %    Eosinophil % 7.1 %    Basophil % 0.0 %    Neut # K/uL 3.5 1.8 - 5.4 THOU/uL    Lymph # K/uL 1.7 1.3 - 3.6 THOU/uL    Mono # K/uL 0.4 0.3 - 0.8 THOU/uL    Eos # K/uL 0.4 0.0 - 0.5 THOU/uL    Baso # K/uL 0.0 0.0 - 0.1  THOU/uL    Nucl RBC % 0.0 0.0 - 0.2 /100 WBC    Nucl RBC # K/uL 0.0 0.0 - 0.0 THOU/uL   Phosphorus    Collection Time: 03/26/17  6:33 AM   Result Value Ref Range    Phosphorus 9.0 (HH) 2.7 - 4.5 mg/dL   Diff manual    Collection Time: 03/26/17  6:33 AM   Result Value Ref Range  React Lymph % 1 0 - 6 %    Giant PLTs Present     Manual DIFF RESULTS     Diff Based On 113 CELLS   POCT glucose    Collection Time: 03/26/17  9:36 AM   Result Value Ref Range    Glucose POCT 127 (H) 60 - 99 mg/dL   POCT glucose    Collection Time: 03/26/17  2:00 PM   Result Value Ref Range    Glucose POCT 147 (H) 60 - 99 mg/dL   POCT glucose    Collection Time: 03/26/17  7:08 PM   Result Value Ref Range    Glucose POCT 185 (H) 60 - 99 mg/dL         Scheduled Meds:   b complex-vitamin c-folic acid  1 tablet Oral Daily    insulin lispro  0-20 Units Subcutaneous TID WC    gabapentin  100 mg Oral Daily    dorzolamide  1 drop Left Eye 2 times per day    timolol  1 drop Left Eye 2 times per day    latanoprost  1 drop Left Eye Nightly     Continuous Infusions:  PRN Meds:.calcium carbonate, Nursing communication- Give 4 OZ of fruit juice for BG < 70 mg/dl **AND** dextrose **AND** dextrose **AND** glucagon **AND** POCT glucose

## 2017-03-26 NOTE — Consults (Signed)
[facility] Collateral Note    [name] - Daughter - [phone]  Writer called and left [name] a message providing her with an update.  Writer requested a phone call back if there were further questions.    Germaine Pomfret, LMSW

## 2017-03-26 NOTE — Plan of Care (Signed)
Problem: Impaired ADL  Goal: Increase ADL Independence  Pt will complete ADLs at a modI level within 1-2 days  Pt will complete self care transfers/mobility at a modI level within 1-2 days

## 2017-03-26 NOTE — Progress Notes (Signed)
Occupational Therapy Eval       03/26/17 1500   OT Tracking   [place] Visit   Visit (#) of Five 1   Vision    Additional Comments difficult to assess secondary to decreased willingness to participate in assessment. 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 Pt states, "Your asking me things I just can't answer." During L eye ROM assessment, pt noted to have decreased temporal ROM(~3/4 range) and deceased nasal ROM(~3/4 range), full range noted superior/inferior. Inconsistent report during peripherial testing, pt agitated and not following commands well for assessment. Pt able to read writer's name badge at ~6 inch with reading glasses donned, pt unable to read time on wall clock(unable to describe why, if blurry, or numbers not bold enought, continullay states, "I just cant!") Pt would benefit from further formal assessment with vision therapist in the outpatient setting.   Cognition   Awareness Impaired   Insight Impaired   Safety/Judgement Impaired   Additional Comments Pt is alert, declining to asnwer basic orientation questions 2/2 to irriation. Pt follwos 1 step commands well with encouragment. Pt has decreased awarenss of and insight into impact visual deficits can have on safety and why it is importance for balance and driving. Pt states, "You're all just too obsessed with this. I don't have an issue, I've had it for 20 years and have learned to compensate," however contniues to report L eye visual changes. Pt declined completing MOCA  this session. Pt agreeable to completing trails A and B. Pt completed Trails A in 1-3secondas (average is 29 sec and >78 sec is deficient). Completed trails B in 404secaonds(average is 75 sec >273 is considered deficient) indicative of decreased sustained and alternating attention, decreased  scanning and ability to filter visual distractors/increased visual stimuli. Based on these scores, antiicapte pt would not be safe to drive at this time.   Medication Management   Additional Comments pt reports independence   UE Assessment   Additional Comments Pt delined to engage in AROM/MMT testing   Bed Mobility   Supine to Sit Independent;HOB flat   Sit to Supine Independent;HOB flat   Functional Transfers   Additional Comments NT pt refused   Balance   Sitting - Static Independent    Sitting - Dynamic Independent   Standing - Static Not Tested  (pt refused)   ADL Assessment   Additional Comments Pt refused to engage in function assessment this session 2/2 fatuige. Defer to team for recommendations as unable to assess.   Activity Tolerance   Endurance Tolerates 10 - 20 min activity with multiple rests   Assessment   Assessment Impaired cognition;Visual deficit   Plan   OT Frequency 1-2x/wk   Patient Will Benefit From Visual re-education   Multidisciplinary Communication   Multidisciplinary Communication pt, RN, PT, NP   Recommendation   OT Discharge Recommendations Outpatient OT  (follow up with outpatient vision rehab specialist)   Additional Comments Limited assessment able to be completed this afternoon secodnary to pt refusal and reported fatuige with increased irritation. With time and explaination pt agreeable to sitting at EOB to complete brief vision assessment however poor williness to particiapate and respond to questions. Pt declining all OOB activitiy, functional transfer and ADLs not able to be assessed. Defer to team for d/c location/recommenations at  this time. Anticiapte pt would benefit from follow up with outpatient OT vision specialist for more formal assesment/recommendations. Anticiapte pt would not be safe to drive at this time based on scores of Trails A and B tests however more formal assessment required.       Timed Calculations:  Timed Codes:  0  Untimed Codes: 25 min Ot eval  Unbilled  Time: 0  Total Time:  25 min      Darrol Jump OTR/L  Pager [phone]    Please contact the OT pager [facility] [phone]) for all questions/concerns and/or update requests.        OT Evaluation Complexity    1.  Chart Review          A expanded review of Patient's Occupational Profile & Medical History was performed.     2.  Occupational Performance          Assessment of patient occupations reveals deficits including:      a.  ADL - N/A      b. IADL - N/A      c. Rest/Sleep - Yes      d. Education - N/A      e. Work - N/A      f. Play - N/A      g. Leisure - Yes      h. Social Participation - Yes    3.  Decision Making          Analysis of data from Detailed Evaluation        A. Some modification needed to complete evaluation.      B. Comorbidities will affect recovery.      C. Single treatment options exist.    4.  Evaluation Complexity is low based on the above.

## 2017-03-27 ENCOUNTER — Encounter: Payer: Self-pay | Admitting: Anesthesiology

## 2017-03-27 DIAGNOSIS — Z992 Dependence on renal dialysis: Secondary | ICD-10-CM

## 2017-03-27 DIAGNOSIS — H539 Unspecified visual disturbance: Secondary | ICD-10-CM | POA: Diagnosis present

## 2017-03-27 DIAGNOSIS — E119 Type 2 diabetes mellitus without complications: Secondary | ICD-10-CM | POA: Diagnosis present

## 2017-03-27 DIAGNOSIS — G8929 Other chronic pain: Secondary | ICD-10-CM | POA: Diagnosis present

## 2017-03-27 DIAGNOSIS — N186 End stage renal disease: Secondary | ICD-10-CM

## 2017-03-27 LAB — POCT GLUCOSE
Glucose POCT: 179 mg/dL — ABNORMAL HIGH (ref 60–99)
Glucose POCT: 221 mg/dL — ABNORMAL HIGH (ref 60–99)

## 2017-03-27 LAB — BLOOD CULTURE: Bacterial Blood Culture: 0

## 2017-03-27 MED ORDER — GABAPENTIN 100 MG PO CAPSULE *I*
100.0000 mg | ORAL_CAPSULE | Freq: Every day | ORAL | 0 refills | Status: AC
Start: 2017-03-27 — End: 2017-04-26

## 2017-03-27 NOTE — Plan of Care (Signed)
Impaired Ambulation     LTG - IMPROVE AMBULATION Adequate for discharge        Impaired Stair Navigation     STG - IMPROVE STAIR NAVIGATION Adequate for discharge

## 2017-03-27 NOTE — Progress Notes (Signed)
Dialysis progress note 03/27/2017 12:32 PM    Subjective:  No acute events overnight. Patient seen and examined in room. Denies SOB or chest pain. Reports he will be leaving today. He will f/u outpatient with eye clinic for biopsy.     Objective:  Vitals:    03/26/17 1300 03/26/17 2150 03/27/17 0718 03/27/17 1038   BP: 110/51 113/59 103/58 91/53   BP Location:  Left arm Left arm Left arm   Pulse:  71 77 70   Resp: 18 18 18 18    Temp: 36.5 C (97.7 F) 36.6 C (97.9 F) 36.1 C (97 F) 37 C (98.6 F)   TempSrc:  Temporal Temporal Temporal   SpO2: 100% 98% 97% 95%   Weight:       Height:         Intake/Output last 3 shifts:I/O last 3 completed shifts:  08/15 0700 - 08/16 0659  In: 800 (11.6 mL/kg) [I.V.:800 (0.5 mL/kg/hr)]  Out: 1900 (27.6 mL/kg) [Other:1600; Stool:300]  Net: -1100  Weight: 68.8 kg     DTO:IZTI developed and well nourished male in NAD  Eyes: anicteric sclerae  Mouth: Moist and pink   CVS: RRR, no rub, no gallop, no murmur   Lungs: CTAB. No wheezes or rhonchi noted   Abd: Soft non tender non distended, + BS   Ext: Edema none   Left AVF with + thrill and bruit noted   Neuro: Awake Alert       Recent Labs  Lab 03/26/17  0633 03/25/17  0625 03/24/17  0519  03/21/17  1706   Sodium 134 136 135  < > 130*   Potassium 4.9 4.8 5.1  < > 7.5*   Chloride 93* 97 93*  < > 86*   CO2 21 22 23   < > 26   UN 61* 39* 48*  < > 44*   Creatinine 12.71* 9.75* 10.54*  < > 11.75*   Glucose 153* 107* 132*  < > 127*   Calcium 8.7 8.5* 9.2  < > 9.2   Albumin  --   --   --   --  4.5   Phosphorus 9.0* 6.9* 8.0*  < >  --    < > = values in this interval not displayed.  No results found for: PTH, VID25   Recent Labs  Lab 03/26/17  0633 03/25/17  0625 03/24/17  0519   WBC 6.0 5.2 5.8   Hemoglobin 10.8* 11.0* 11.8*   Hematocrit 32* 34* 36*   Platelets 96* 90* 88*         Component Value Date/Time    FE 29 (L) 03/22/2017 0040    IBC 233 (L) 03/22/2017 0040    FESAT 12 (L) 03/22/2017 0040    FER 1000 (H) 03/22/2017 0040        No results  found.         Assessment/Plan  ELKIN BELFIELD is a 71 y.o. Male  Victor Flowers is a 71 y.o. Male with ESRD on HD MWF in [place], DM type 2, history of FAP, Barrett's esophagus, GERD, HLD, and chronic hip pain who was brought to [facility] ED todday because of chills and AMS.    1. ESRD on HD for a year now in [place], on MWF per patient:   - No acute indication for dialysis today. HD tomorrow   - Monitor chemistry   - Dose adjust medications for CrCl < 10   - Nephrovite  2. Anemia: Goal hemoglobin 10-11 g/dl   Hgb within goal range   Follow CBC     3. CKD-MBD:      Calcium acceptable   Hyperphosphatemia - need to start on Phos binders   Monitor calcium and phosphate levels     4. Volume/Blood Pressure:   BP as above   Can add midodrine 5 mg. To be given 30 minutes prior to dialysis     5. Access:   - AVF     6. Left eye vision problem that started this week  - Neurology consulted   - Opthal following      Thank you for allowing [place] to participate in the care of this patient  Please call with questions, attending recommendations to follow.     Author: Eugenio Hoes, NP  as of: 03/27/2017  at: 12:32 PM   Pager [phone]
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
## 2017-03-27 NOTE — Anesthesia Preprocedure Evaluation (Addendum)
Anesthesia Pre-operative History and Physical for Victor Flowers  .  Marland Kitchen  Anesthesia Evaluation Information Source: records, patient     ANESTHESIA HISTORY  Pertinent(-):  No History of anesthetic complications    GENERAL  Pertinent (-):  No obesity     PULMONARY    + Smoker          tobacco former  Pertinent(-):  No asthma, snoring, sleep apnea or COPD    CARDIOVASCULAR  Good(4+METs) Exercise Tolerance    + Hypertension          well controlled    GI/HEPATIC/RENAL  Last PO Intake: Enter Last PO Intake in ROS/Med Hx Tab    + GERD    + Esophageal Issues (Barrett's )    + Bowel Issues (FAP)    + Renal Issues          ESRD, hemodialysis NEURO/PSYCH    + Chronic pain    Comment: Acute encephalopathy      ENDO/OTHER    + Diabetes Mellitus          Type 2 using insulin  Pertinent(-):  No thyroid disease           Physical Exam    Airway            Mouth opening: normal            Mallampati: II            TM distance (fb): >3 FB            Neck ROM: full  Dental   Normal Exam   Cardiovascular           Rhythm: regular           Rate: normal         Pulmonary     breath sounds clear to auscultation    Mental Status     oriented to person, place and time         ________________________________________________________________________  PLAN  ASA Score  3  Anesthetic Plan MAC     Induction (routine IV) General Anesthesia/Sedation Maintenance Plan (propofol infusion and IV bolus); Airway (nasal cannula); Line ( use current access); Monitoring (standard ASA); Positioning (supine); Pain (per surgical team); PostOp (PACU)    Informed Consent     Risks:          Risks discussed were commensurate with the plan listed above with the following specific points: N/V, aspiration and hypotension , damage to:(eyes), allergic Rx, unexpected serious injury, awareness    Anesthetic Consent:         Anesthetic plan (and risks as noted above) were discussed with patient    Plan also discussed with team members including:       CRNA and  attending    Attending Attestation:  As the primary attending anesthesiologist, I attest that the patient or proxy understands and accepts the risks and benefits of the anesthesia plan. I also attest that I have personally performed a pre-anesthetic examination and evaluation, and prescribed the anesthetic plan for this particular location within 48 hours prior to the anesthetic as documented. Mariah Milling, MD 11:23 AM

## 2017-03-27 NOTE — Plan of Care (Signed)
Cognitive function     Cognitive function will be maintained or return to baseline Maintaining        Mobility     Functional status is maintained or improved - Geriatric Maintaining        Nutrition     Nutritional status is maintained or improved - Geriatric Maintaining        Pain/Comfort     Patient's pain or discomfort is manageable Maintaining        Psychosocial     Demonstrates ability to cope with illness Maintaining        Safety     Patient will remain free of falls Maintaining        Pt remains free of falls, independent in room. PIV removed from neck by provider last evening, line no longer needed. Pt denies pain but gave PRN TUMS overnight. Will continue to monitor.

## 2017-03-27 NOTE — Progress Notes (Signed)
PT Treatment Note    Victor Flowers demonstrates adequate mobility to return to his prior living arrangement. No further acute PT needed. Discharge Recommendations: (P) Anticipate return to prior living arrangement after discharge from the hospital.     PT Discharge Equipment Recommended: (P) None     03/27/17 0900   PT Tracking   PT TRACKING PT Discontinue   Visit Number   Visit Number [facility]) / Treatment Day (HH) 0   Precautions/Observations   Precautions used Yes   LDA Observation None   Fall Precautions General falls precautions   Other Patient cooperative and appologetic for being irritable and declining walking yesterday after dialysis   Pain Assessment   *Is the patient currently in pain? Denies   Cognition   Arousal/Alertness Appropriate responses to stimuli   Orientation A&Ox3   Following Commands Follows simple commands without difficulty   Safety Judgement Impaired   Additional Comments Continues to feel he is able to drive self to [place] despite visual deficits   Bed Mobility   Bed mobility Tested   Supine to Sit Independent   Sit to Supine Independent   Transfers   Transfers Tested   Stand Pivot Transfers Independent   Sit to Stand Independent   Stand to sit Independent   Transfer Assistive Device none   Additional comments Stood easily from bed   Mobility   Mobility Tested   Gait Pattern Decreased cadence   Ambulation Assist Independent   Ambulation Distance (Feet) 240'   Ambulation Assistive Device None   Stairs Assistance Modified independent (device)   Stair Management Technique One rail;Alternating pattern   Number of Stairs 10   Additional comments Patient with a steady gait and was able to navigate stairs unassisted holding a railing   Balance   Balance Tested   Sitting - Static Independent    Sitting - Dynamic Independent   Standing - Static Independent   Standing - Dynamic Independent   PT AM-PAC Mobility   Turning over in bed? 4   Sitting down on and standing up from a chair with arms? 4   Moving  from lying on back to sitting on the side of the bed? 4   Moving to and from a bed to a chair? 4   Need to walk in hospital room? 4   Climbing 3 - 5 steps with a railing? 4   Total Raw Score 24   Assessment   Brief Assessment Patient demonstrates adequate mobility skills to return home   Additional Comments Patient appears to be at baseline   Plan/Recommendation   Treatment Interventions Assess functional mobility;Pt/Family education;D/C planning   PT Frequency none further   Hospital Stay Recommendations Out of bed with nursing assist;Ambulate daily with nursing assist;May be out of bed with family assist;May ambulate with family assist   Discharge Recommendations Anticipate return to prior living arrangement   PT Discharge Equipment Recommended None   Assessment/Recommendations Reviewed With: Care coordinator;Midlevel provider;Nursing;Patient;Physician;Social Worker   Time Calculation   PT Timed Codes 23   PT Untimed Codes 0   PT Unbilled Time 0   PT Total Treatment 23   Plan and Onset date   Plan of Care Date 03/27/17   Onset Date 03/21/17   Treatment Start Date 03/24/17   PT Charges   $PT [facility] Charges Functional Training - code 0239 (25min);Gait Training - code 0208 (36min)   PT Medicare Functional Reporting   Goal Status Mobility Walking Moving Around /  Mobility  GOAL status  G8979   $Mobility Walking Moving Around /  Mobility GOAL status  G8979 CH  0 % impaired, limited or restricted   Discharge Status Mobility Walking Moving Around / Discharge status  [phone]   $Mobility Walking Moving Around / Discharge status  G8980 CH  0 % impaired, limited or restricted   Tumeka Chimenti A. Ronney Lion, PT  Pager [phone]

## 2017-03-27 NOTE — Discharge Instructions (Addendum)
Brief Summary of Your Hospital Course:  You were admitted with worsening mental status, fever, malaise, weakness and shortness of breath after missing your dialysis treatments.  You were also found to have a high potassium level.  You had urgent dialysis started.  You were feeling better after that.  You also were seen by the eye doctors for management of your vision problems. You agreed to follow up with them here in [place] prior to returning to [place]    What to do after you leave the hospital:  - Please take all medications as prescribed  - Please attend all follow up appointments as scheduled  - Please continue with dialysis as arranged (in [place]) while here in [place]-- next appointment is Friday 8/17    Medication Changes on Discharge:  - Decreased to Gabapentin 100mg  by mouth daily    Recommended diet: Diabetic, Low Sodium, Low Potassium and Low Phosphorous    Recommended activity: activity as tolerated      Diabetes management:check Fingerstick blood glucoses before meals and bedtime- keep a record for your Dr. And take to your appointment        If you experience any of these symptoms 24 hours or more after discharge:Uncontrolled pain, Chest pain, Shortness of breath, Fever of 101 F. or greater, Chills, Poor appetite, Poor urinary output, Vomiting, Nausea, Diarrhea, Blood in stool or Weakness  Call  Your PCP in [place]       If you experience any of these symptoms within the first 24 hours after discharge call Dr.  Harriet Masson at [phone]      Smoking    Smoking can increase your chances of developing chronic health problems or worsen conditions you already have.  If you smoke you should quit. Smoking cessation information has been given to you for your review to help you quit.  Medications to help you quit are available.  Ask your doctor if you would like to receive these medications.       Medications  Your doctor has prescribed medications to improve or manage your condition.  You should take  them as prescribed by your doctor.  Ask your doctor for any questions regarding these medications.          Diet  A healthy diet is important to help you stay well.  Some health conditions require you to be on a special diet.  You should monitor your fluid intake and limit the amount of sodium including table salt.  This will help you avoid fluid retention which can cause shortness of breath or swelling of the feet and ankles.  Reading food labels is helpful when you are on a special diet.  Follow instructions from your doctor for any other special dietary requirement.    Exercise/Activity  Activity and exercise are important to your well being.  While you are in the hospital your activity may be restricted.  As your condition improves your activity level will be increased.  Most patients will be able to gradually resume activity as before.  You should follow your doctors activity recommendations      Daily Weight  You will need to weigh yourself every day at the same time on the same scale, preferably after you empty your bladder.  If you have an increase of 3 pounds in 2 days or 5 pounds in 1 week you should contact your physician.  A daily written log of your weights is a good way to keep track.  You  should follow your doctors recommendations on monitoring your weight.     What to do if your condition changes?  Once you leave the hospital you should contact your doctors office to make a follow up appointment.  If at any time you have any questions or concerns or your condition gets worse, contact your physician.  If you can not reach your physician or you develop life threatening symptoms such as trouble breathing or chest pain you should go to the closest Emergency Department.     Education  You may receive additional information on your specific condition before you are discharged.  Please ask your nurse or doctor for your information before you leave the hospital.
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
## 2017-03-28 ENCOUNTER — Ambulatory Visit: Payer: Medicare (Managed Care) | Admitting: Anesthesiology

## 2017-03-28 ENCOUNTER — Ambulatory Visit
Admission: RE | Admit: 2017-03-28 | Discharge: 2017-03-28 | Disposition: A | Discharge status: Home / Self Care | Payer: Medicare (Managed Care) | Source: Ambulatory Visit | Attending: Ophthalmology | Admitting: Ophthalmology

## 2017-03-28 ENCOUNTER — Encounter
Admission: RE | Disposition: A | Discharge status: Home / Self Care | Payer: Self-pay | Source: Ambulatory Visit | Attending: Ophthalmology

## 2017-03-28 DIAGNOSIS — E1122 Type 2 diabetes mellitus with diabetic chronic kidney disease: Secondary | ICD-10-CM | POA: Insufficient documentation

## 2017-03-28 DIAGNOSIS — M316 Other giant cell arteritis: Secondary | ICD-10-CM

## 2017-03-28 DIAGNOSIS — Z992 Dependence on renal dialysis: Secondary | ICD-10-CM | POA: Insufficient documentation

## 2017-03-28 DIAGNOSIS — Z87891 Personal history of nicotine dependence: Secondary | ICD-10-CM | POA: Insufficient documentation

## 2017-03-28 DIAGNOSIS — N186 End stage renal disease: Secondary | ICD-10-CM | POA: Insufficient documentation

## 2017-03-28 DIAGNOSIS — I7789 Other specified disorders of arteries and arterioles: Secondary | ICD-10-CM | POA: Insufficient documentation

## 2017-03-28 DIAGNOSIS — H5319 Other subjective visual disturbances: Secondary | ICD-10-CM

## 2017-03-28 DIAGNOSIS — I120 Hypertensive chronic kidney disease with stage 5 chronic kidney disease or end stage renal disease: Secondary | ICD-10-CM | POA: Insufficient documentation

## 2017-03-28 DIAGNOSIS — Z794 Long term (current) use of insulin: Secondary | ICD-10-CM | POA: Insufficient documentation

## 2017-03-28 HISTORY — PX: PR LIGATION/BIOPSY TEMPORAL ARTERY: 37609

## 2017-03-28 HISTORY — PX: PR TEMPORAL ARTERY LIGATN OR BX: 37609

## 2017-03-28 LAB — POCT GLUCOSE
Glucose POCT: 208 mg/dL — ABNORMAL HIGH (ref 60–99)
Glucose POCT: 136 mg/dL — ABNORMAL HIGH (ref 60–99)

## 2017-03-28 LAB — POTASSIUM, WHOLE BLOOD: Potassium,WB: 4.7 mmol/L (ref 3.4–4.7)

## 2017-03-28 SURGERY — BIOPSY, ARTERY, TEMPORAL
Anesthesia: Monitor Anesthesia Care | Site: Head | Wound class: Clean

## 2017-03-28 MED ORDER — LACTATED RINGERS IV SOLN *I*
20.0000 mL/h | INTRAVENOUS | Status: DC
Start: 2017-03-28 — End: 2017-03-28

## 2017-03-28 MED ORDER — BACITRACIN-POLYMYXIN B 500-10000 UNIT/GM OP OINT *I*
TOPICAL_OINTMENT | OPHTHALMIC | 0 refills | Status: DC
Start: 2017-03-28 — End: 2018-01-09

## 2017-03-28 MED ORDER — ONDANSETRON HCL 2 MG/ML IV SOLN *I*
INTRAMUSCULAR | Status: DC | PRN
Start: 2017-03-28 — End: 2017-03-28
  Administered 2017-03-28: 4 mg via INTRAMUSCULAR

## 2017-03-28 MED ORDER — FENTANYL CITRATE 50 MCG/ML IJ SOLN *WRAPPED*
INTRAMUSCULAR | Status: DC | PRN
Start: 2017-03-28 — End: 2017-03-28
  Administered 2017-03-28: 50 ug via INTRAVENOUS

## 2017-03-28 MED ORDER — SODIUM CHLORIDE 0.9 % IV SOLN WRAPPED *I*
20.0000 mL/h | Status: DC
Start: 2017-03-28 — End: 2017-03-28

## 2017-03-28 MED ORDER — BACITRACIN-POLYMYXIN B 500-10000 UNIT/GM OP OINT *I*
TOPICAL_OINTMENT | OPHTHALMIC | Status: DC | PRN
Start: 2017-03-28 — End: 2017-03-28
  Administered 2017-03-28: 1 via OPHTHALMIC

## 2017-03-28 MED ORDER — MIDAZOLAM HCL 1 MG/ML IJ SOLN *I* WRAPPED
INTRAMUSCULAR | Status: AC
Start: 2017-03-28 — End: 2017-03-28
  Filled 2017-03-28: qty 2

## 2017-03-28 MED ORDER — PROPOFOL 10 MG/ML IV EMUL (INTERMITTENT DOSING) WRAPPED *I*
INTRAVENOUS | Status: AC
Start: 2017-03-28 — End: 2017-03-28
  Filled 2017-03-28: qty 50

## 2017-03-28 MED ORDER — MIDAZOLAM HCL 1 MG/ML IJ SOLN *I* WRAPPED
INTRAMUSCULAR | Status: DC | PRN
Start: 2017-03-28 — End: 2017-03-28
  Administered 2017-03-28: 1 mg via INTRAVENOUS

## 2017-03-28 MED ORDER — FENTANYL CITRATE 50 MCG/ML IJ SOLN *WRAPPED*
INTRAMUSCULAR | Status: AC
Start: 2017-03-28 — End: 2017-03-28
  Filled 2017-03-28: qty 2

## 2017-03-28 MED ORDER — LIDOCAINE HCL 2 % IJ SOLN *I*
INTRAMUSCULAR | Status: DC | PRN
Start: 2017-03-28 — End: 2017-03-28
  Administered 2017-03-28: 30 mg via INTRAVENOUS

## 2017-03-28 MED ORDER — ONDANSETRON HCL 2 MG/ML IV SOLN *I*
4.0000 mg | Freq: Once | INTRAMUSCULAR | Status: DC | PRN
Start: 2017-03-28 — End: 2017-03-28

## 2017-03-28 MED ORDER — ONDANSETRON HCL 2 MG/ML IV SOLN *I*
INTRAMUSCULAR | Status: AC
Start: 2017-03-28 — End: 2017-03-28
  Filled 2017-03-28: qty 2

## 2017-03-28 MED ORDER — PROPOFOL 10 MG/ML IV EMUL (INTERMITTENT DOSING) WRAPPED *I*
INTRAVENOUS | Status: DC | PRN
Start: 2017-03-28 — End: 2017-03-28
  Administered 2017-03-28: 30 mg via INTRAVENOUS

## 2017-03-28 MED ORDER — LIDOCAINE HCL 2 % (PF) IJ SOLN *I*
INTRAMUSCULAR | Status: AC
Start: 2017-03-28 — End: 2017-03-28
  Filled 2017-03-28: qty 5

## 2017-03-28 MED ORDER — LIDOCAINE HCL 1 % IJ SOLN *I*
0.1000 mL | INTRAMUSCULAR | Status: DC | PRN
Start: 2017-03-28 — End: 2017-03-28

## 2017-03-28 MED ORDER — LIDOCAINE-EPINEPHRINE 1 %-1:100000 IJ SOLN *I*
INTRAMUSCULAR | Status: DC | PRN
Start: 2017-03-28 — End: 2017-03-28
  Administered 2017-03-28: 3 mL via SUBCUTANEOUS

## 2017-03-28 MED ORDER — OXYCODONE-ACETAMINOPHEN 5-325 MG PO TABS *I*
1.0000 | ORAL_TABLET | Freq: Once | ORAL | Status: DC | PRN
Start: 2017-03-28 — End: 2017-03-28

## 2017-03-28 MED ORDER — OXYCODONE-ACETAMINOPHEN 5-325 MG PO TABS *I*
1.0000 | ORAL_TABLET | ORAL | 0 refills | Status: DC | PRN
Start: 2017-03-28 — End: 2018-01-09

## 2017-03-28 SURGICAL SUPPLY — 29 items
ADHESIVE MASTISOL 2/3CC VIAL (Dressing) ×3 IMPLANT
APPLICATOR COTTON PLAST TIP 3IN STER 100 (Supply) ×3 IMPLANT
BLADE SURG CARBON STEEL #15 STER (Supply) ×3 IMPLANT
BLADE SURG CARBON STEEL #15 STER REUSE HNDL (Supply) ×1 IMPLANT
BOWL STERILE MED 16OZ (Supply) ×3 IMPLANT
CLOSURE STERI-STRIP REINF .25 X 3IN LF (Dressing) ×4 IMPLANT
CONTAINER SHARP CENTRE (Supply) ×1 IMPLANT
COUNTER NEEDLE SM (Supply) ×2
COVER MAYO STAND (Drape) ×2
COVER MAYO STD W24XL53IN 3 LAYR SMS RECOIL TECH DISP (Drape) ×1 IMPLANT
DRAPE SHEET 40X70 MED (Drape) ×3 IMPLANT
DRAPE SURG APERTURE 15 X 15IN SM (Drape) ×3 IMPLANT
DRESSING TEGADERM W/LABEL 2 3/8 X 2 3/4 (Dressing) ×3 IMPLANT
DRESSING TELFA 3IN X 6IN (Dressing) ×3 IMPLANT
GLOVE BIOGEL PI PRO-FIT SZ 7.5 LF (Glove) ×3 IMPLANT
GOWN SIRIUS RAGLAN NONREINFORCED L (Gown) ×6 IMPLANT
HANDLE LITE EZ RIGID NL (Supply) ×6 IMPLANT
NEEDLE BOVIE COLORADO (Needle) ×3 IMPLANT
PAD GROUNDING ADULT SURE FIT (Supply) ×3 IMPLANT
PEN SURG MARKER WITH RULER (Supply) ×3 IMPLANT
PENCIL BUTTON SWITCH W/HOLSTER SS DISP (Supply) ×3 IMPLANT
SPONGE X-RAY 4 X 8 STERILE NL (Sponge) ×3 IMPLANT
STRIP STERI SKIN CLSR NONREINF .5 X 4IN (Dressing) ×3 IMPLANT
SUTR PLAIN GUT 5-0 P-3 18 IN (Suture) ×2 IMPLANT
SUTR SILK 5-0 P-3 18 IN BLACK (Suture) ×2 IMPLANT
SUTR VICRYL 5-0 P-3 CT 18 IN UNDY (Suture) ×2 IMPLANT
TOWEL SURG BLU 2 PLY ZIG ZAG FLD (Supply) ×1 IMPLANT
TOWEL SURGICAL BLUE (Supply) ×2
TUBING NONCONDUC CONN 12FT X 3/16IN (Tubing) ×3 IMPLANT

## 2017-03-28 NOTE — Anesthesia Postprocedure Evaluation (Signed)
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] Surgeon(s):  Anselmo Rod, MD  He, Erlinda Hong, MD Attending Anesthesiologist:  Mariah Milling, MD         Recovery Vitals  BP: 118/57 (03/28/2017  1:31 PM)  Heart Rate: 79 (03/28/2017  1:31 PM)  Heart Rate (via Pulse Ox): 54 (03/26/2017  1:00 PM)  Resp: 20 (03/28/2017  1:45 PM)  Temp: 36.2 C (97.2 F) (03/28/2017  1:31 PM)  SpO2: 97 % (03/28/2017  1:31 PM)   0-10 Scale: 0 (03/28/2017  1:31 PM)  Anesthesia type:  Monitor Anesthesia Care  Complications Noted During Procedure or in PACU:  None   Comment:    Patient Location:  ASC  Level of Consciousness:    Recovered to baseline  Patient Participation:     Able to participate  Temperature Status:    Normothermic  Oxygen Saturation:    Within patient's normal range  Cardiac Status:   Within patient's normal range  Fluid Status:    Stable  Airway Patency:     Yes  Pulmonary Status:    Baseline  Pain Management:    Adequate analgesia  Nausea and Vomiting:  None    Post Op Assessment:    Tolerated procedure well   Attending Attestation:  All indicated post anesthesia care provided     -

## 2017-03-28 NOTE — Anesthesia Case Conclusion (Signed)
CASE CONCLUSION  Emergence  Criteria Used for Airway Removal:  Adequate Tv & RR, acceptable O2 saturation and following commands  Assessment:  Routine  Transport  Directly to: ASC  Position:  Recumbent  Patient Condition on Handoff  Level of Consciousness:  Alert/talking/calm  Patient Condition:  Stable  Handoff Report to:  RN

## 2017-03-28 NOTE — OR Nursing (Signed)
PT Seen, Id'd, and Signed Off in Pre-an @ 1245

## 2017-03-28 NOTE — Op Note (Signed)
Victor Flowers, Victor Flowers  MR #:  [id number]   CSN:  [id number] DOB:  03/20/1946    AGE:  71     SURGEON:  Anselmo Rod, MD  CO-SURGEON:    ASSISTANT:  Sophie He  SURGERY DATE:  03/28/2017    PREOPERATIVE DIAGNOSIS:  Suspected giant cell arteritis.    POSTOPERATIVE DIAGNOSIS:  Suspected giant cell arteritis.    OPERATIVE PROCEDURE:  Temporal artery biopsy, left.    ANESTHESIA:  TIVA.    COMPLICATIONS:  None.    ESTIMATED BLOOD LOSS:  5 mL.    SPECIMENS:  Left temporal artery segment, approximately 1.5 cm in vivo.    INDICATIONS:  [name] is a 71 year old male with a history of symptoms concerning for giant cell arteritis who presents for treatment.    DESCRIPTION OF PROCEDURE:  The patient was informed of all risks, benefits, and alternatives, and elected to have surgery.  On the day of surgery, the patient was brought to the operating room where a time-out was taken to identify the patient, operative site, operative procedure, and all drug allergies.  Sedation was provided.  The temporal artery was palpated, and local was administered.  The patient was then prepped and draped in the usual sterile manner for surgery on the face.  The [place] monopolar was used to make an incision.  Dissection was carried down to the vessel, which was ligated proximally and distally with 5-0 silk.  The suture was then trimmed and attention turned to closure.  5-0 Vicryl was used to close the deep layer x4.  Superficially, skin was closed with running 5-0 plain.     The patient's face was cleaned.  Antibiotic ointment was applied, as was Mastisol, Steri-Strips, and Tegaderm.     The patient tolerated the procedure well, went to recovery.             ______________________________  Anselmo Rod, MD    MG/MODL  DD:  03/28/2017 13:28:45  DT:  03/28/2017 13:43:03  Job #:  [id number]    cc:

## 2017-03-28 NOTE — Discharge Instructions (Signed)
Mithra O. Gonzalez, MD  Assistant Professor  Oculofacial Plastic and Orbital Surgery                                          HOME CARE INSTRUCTIONS FOR TEMPORAL ARTERY BIOPSY    AFTER SURGERY     What to expect:    • Temporary bruising, swelling, and tightness about incision is normal.  • You may have difficulty closing your eyes the first few days.   • Maximum swelling and discomfort will occur in the first few days, particularly in the mornings and then improve with each day.  • Often absorbable sutures will be used and do not need to be removed.  If non-absorbable sutures were used they will be removed in the first week.  • Crushed ice placed in a plastic bag should be applied to the incision(s) for 20 minutes per hour while awake for the first 72 hours to decrease swelling and discomfort.   • Maintain incision bandage as it will be removed at your 1 week follow-up appointment.   • After the bandage has been removed, apply a small amount of ointment (about the size of a grain of rice) to the incision 3 times per day for 2 weeks.                 Activities:    • Rest for the first 24-48 hours.    • Avoid bending and lifting (<15lbs) for 1 week (and all strenuous activity for 3 weeks) after surgery to minimize the risk of bleeding.   Maintain head elevation (extra pillows in bed or sleeping in a recliner) as much as possible.  You may resume light activity (walking) after the first night.  • You may shower after 24 hours.  Avoiding spraying water directly on incision. Do not scrub incisions. If wounds become wet, blot dry.  • Continue to take your routine medications as prescribed by your primary physician during the postoperative period. This includes eye drop(s) prescribed by another eye doctor.  • Please avoid aspirin or non-steroidal anti-inflammatory drugs (“NSAIDs”/Ibuprofen) for one week, if possible, because they may cause bleeding.  Acetaminophen (Tylenol) may be taken if needed for pain.  Avoid alcohol  and driving if taking narcotics (Vicodin or Percocet) for pain.  • Driving is not advisable for the first 3-5 days after surgery.  • Please avoid swimming and hot tubs for two weeks.  • Avoid smoking as this delays healing.  • Use sun protection such as sunscreen or hats to minimize the appearance of incisions.         Follow-up care:    • The scar may remain slightly pink for approximately 6 weeks.  • Facial make-up can cover bruising after the sutures have dissolved.   • Avoid sun exposure to the scar by wearing hats and using sunscreen.        When to call:    • If you have any increased redness, yellow or green discharge from the incisions, fever, bleeding that is difficult to control with light pressure, loss of vision, severe pain not relieved by pain medicine, please call immediately.  • A very rare, hemorrhage can occur and result in significant swelling or oozing of blood about incision.  If that were to occur, please please contact us immediately.  • If you have any questions or   concerns, please call me through my office [phone]

## 2017-03-28 NOTE — Preop H&P (Signed)
UPDATES TO PATIENT'S CONDITION on the DAY OF SURGERY/PROCEDURE    I. Updates to Patient's Condition (to be completed by a provider privileged to complete a H&P, following reassessment of the patient by the provider):    Day of Surgery/Procedure Update:  History  History reviewed and no change    Physical  Physical exam updated and no change            II. Procedure Readiness   I have reviewed the patient's H&P and updated condition. By completing and signing this form, I attest that this patient is ready for surgery/procedure.    III. Attestation   I have reviewed the updated information regarding the patient's condition and it is appropriate to proceed with the planned surgery/procedure.    Anselmo Rod, MD as of 6:53 AM 03/28/2017

## 2017-03-28 NOTE — Anesthesia Procedure Notes (Signed)
---------------------------------------------------------------------------------------------------------------------------------------    AIRWAY   GENERAL INFORMATION AND STAFF    Patient location during procedure: OR       Date of Procedure: 03/28/2017 1:09 PM  CONDITION PRIOR TO MANIPULATION     Current Airway/Neck Condition:  Normal        For more airway physical exam details, see Anesthesia PreOp Evaluation  FINAL AIRWAY DETAILS  Final Airway Type:  Nasal cannula  ----------------------------------------------------------------------------------------------------------------------------------------

## 2017-03-28 NOTE — INTERIM OP NOTE (Signed)
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]       Procedure(s) Performed: Procedure:    TEMPORAL ARTERY BIOPSY  CPT(R) Code:  09381 - PR TEMPORAL ARTERY LIGATN OR BX         Additional CPT Codes:        Anesthesia Type: Monitor Anesthesia Care        Fluid Totals: I/O this shift:  08/17 0700 - 08/17 1459  In: 450 (6.5 mL/kg) [I.V.:450]  Out: 0 (0 mL/kg)   Net: 450  Weight: 69.6 kg        Estimated Blood Loss: Blood Loss: 0 mL       Specimens to Pathology:    ID Type Source Tests Collected by Time Destination   A : Left temperal artery segment TISSUE Tissue SURGICAL PATHOLOGY Elwyn Lade, RN 03/28/2017 1311           Temporary Implants:        Packing:                 Patient Condition: good       Findings (Including unexpected complications): Temporal artery biopsy     Signed:  Erlinda Hong Shjon Lizarraga, MD  on 03/28/2017 at 1:29 PM

## 2017-03-29 ENCOUNTER — Telehealth: Payer: Self-pay | Admitting: Ophthalmology

## 2017-03-29 NOTE — Telephone Encounter (Signed)
Call received this afternoon from pathology regarding temporal artery biopsy results. Per pathology:   No definitive histologic evidence of giant cell arteritis, however, mild intimal hyperplasia and focal elastic lamina disruption, however no inflammation identified.

## 2017-03-31 ENCOUNTER — Encounter: Payer: Self-pay | Admitting: Ophthalmology

## 2017-03-31 LAB — SURGICAL PATHOLOGY

## 2017-04-03 ENCOUNTER — Encounter: Payer: Self-pay | Admitting: Ophthalmology

## 2017-04-03 ENCOUNTER — Ambulatory Visit: Payer: Medicare (Managed Care) | Attending: Ophthalmology | Admitting: Ophthalmology

## 2017-04-03 DIAGNOSIS — Z9889 Other specified postprocedural states: Secondary | ICD-10-CM

## 2017-04-03 NOTE — Progress Notes (Signed)
Chief Complaint   Patient presents with    Post-op     1 wk s/p Temporal artery biopsy, left. 03/28/17       HPI     Post-op    Additional comments: 1 wk s/p Temporal artery biopsy, left. 03/28/17           Comments    Victor Flowers is a 71 y.o. male presenting 1 wk s/p Temporal artery   biopsy, left. 03/28/17. He reports no pain or discomfort since the first   day after surgery; some itching began yesterday. He also notes numbness in   the left temporal region that has not resolved. VA comes and goes. Denies   redness, tearing, flashes, and floaters.    Meds:   polysporin TID to wound (didn't use)   Latanoprost qHS OS  Cosopt BID OS       Last edited by He, Erlinda Hong, MD on 04/03/2017  6:45 PM. (History)        Current Outpatient Prescriptions (Ophthalmic)   Medication Sig Dispense Refill    bacitracin-polymyxin b (POLYSPORIN) ophthalmic ointment Place on left wound 3 times a day starting after removal of dressing in clinic 3.5 g 0    dorzolamide-timolol (COSOPT) 22.3-6.8 MG/ML ophthalmic solution Place 1 drop into the left eye 2 times daily      latanoprost (XALATAN) 0.005 % ophthalmic solution Place 1 drop into the left eye nightly       Current Outpatient Prescriptions (Other)   Medication Sig Dispense Refill    oxyCODONE-acetaminophen (PERCOCET) 5-325 MG per tablet Take 1 tablet by mouth every 4-6 hours as needed for Pain   Max daily dose: 5 tablets 12 tablet 0    gabapentin (NEURONTIN) 100MG  capsule Take 1 capsule (100 mg total) by mouth daily 30 capsule 0    B Complex-C-Folic Acid (RENAL-VITE) 0.8 MG TABS Take 0.8 mg by mouth at bedtime      HYDROcodone-acetaminophen (NORCO) 5-325 MG per tablet Take 1 tablet by mouth every 8 hours as needed for Pain      insulin detemir (LEVEMIR) 100 UNIT/ML injection vial Inject 46 Units into the skin daily   Maximum  46  Units/day      insulin lispro 100 UNIT/ML injection vial Inject 12 Units into the skin 3 times daily (before meals)   Maximum  36  Units/day       lidocaine-prilocaine (EMLA) cream Apply topically three times a week   Apply to fistula area 30-32min prior to HD      multi-vitamin (MULTIVITAMIN) per tablet Take 1 tablet by mouth daily      sodium polystyrene (KAYEXALATE) 15 GM/60ML suspension Take 30 g by mouth three times a week   Take if directed by MD for elevated potassium.         Past Medical History:   Diagnosis Date    Barrett's esophagus     BPH (benign prostatic hyperplasia)     ESRD needing dialysis     FAP (familial adenomatous polyposis)     HLD (hyperlipidemia)     HTN (hypertension)        Past Medical History:   Diagnosis Date    Barrett's esophagus     BPH (benign prostatic hyperplasia)     ESRD needing dialysis     FAP (familial adenomatous polyposis)     HLD (hyperlipidemia)     HTN (hypertension)        History reviewed. No pertinent family history.  Social History     Social History    Marital status: Divorced     Spouse name: N/A    Number of children: N/A    Years of education: N/A     Social History Main Topics    Smoking status: Former Smoker    Smokeless tobacco: Never Used    Alcohol use No    Drug use: No    Sexual activity: Not Asked     Other Topics Concern    None     Social History Narrative       ROS     Positive for: Eyes ( S/p: Temporal artery biopsy, left. 03/28/17)    Negative for: Constitutional, Gastrointestinal, Neurological, Skin,   Genitourinary, Musculoskeletal, HENT, Endocrine, Cardiovascular,   Respiratory, Psychiatric, Allergic/Imm, Heme/Lymph    Last edited by Olga Millers on 04/03/2017 11:25 AM. (History)        Visual Acuity (Snellen - Linear)      Right Left   Dist sc LP 20/30 -2   Dist ph sc  NI             Pupils      Dark Light React   Right opaque cornea     Left 4 3 Brisk             Tonometry (Tonopen, 11:50 AM)      Right Left   Pressure 09 10             Base Eye Exam     Visual Acuity (Snellen - Linear)      Right Left   Dist sc LP 20/30 -2   Dist ph sc  NI         Tonometry  (Tonopen, 11:50 AM)      Right Left   Pressure 09 10         Pupils      Dark Light React   Right opaque cornea     Left 4 3 Brisk         Visual Fields      Left Right   Result Full    Restrictions  Total superior temporal, inferior temporal, superior nasal, inferior nasal deficiencies         Extraocular Movement      Right Left   Result Full Full         Neuro/Psych     Oriented x3:  Yes    Mood/Affect:  Normal            Slit Lamp and Fundus Exam     External Exam      Right Left    External  incision c/d/i                    Assessment      1. Suspected GCA:    TAB(03/28/2017)    Pathology: Negative for GCA    Pt was not started on steroid due to risk > benefit prior to the biopsy.     Recommendations:   -Continue post operative management.     F/U:  -c oculofacial plastics PRN.

## 2017-07-28 ENCOUNTER — Telehealth: Payer: Self-pay
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
## 2017-08-06 ENCOUNTER — Telehealth: Payer: Self-pay

## 2017-08-06 NOTE — Telephone Encounter (Signed)
Called and spoke to pt, stated that he was on his way to the hospital he was feeling very weak. I asked if he wanted me to call anyone in his family, he declined. I told him I would call him in a few weeks. Pt was agreeable to this. Darcel Smalling, RN

## 2017-08-19 ENCOUNTER — Telehealth: Payer: Self-pay

## 2017-08-19 NOTE — Telephone Encounter (Signed)
Called and spoke to pt, he is now home from the hospital. Stated that he "got a bug in his tummy" and is on antibiotics. There are no possible living donors at this time. Pt doesn't have a dentist and hasn't been to one in a while, its expensive he states. Pt walks his dog and drives himself to and from HD, per HD notes he used to hike but has decreased since his energy level has decreased. Pt states that his compliance is good. No ETOH or substance use or abuse. Pt would come to [place] for the surgery since his support is located here: ex wife, daughter, 2 sisters and a brother in [place]. EPTS 93%, KTMI 5-6. I faxed [facility] to obtain CT scan report and images from 08/2015 CT A/P. I told pt that I would review his case with our clinical manager and get back to him on whether he was a transplant candidate at all, if he is-then what possible testing he would need prior to evaluation. Pt verbalized understanding. Darcel Smalling, RN

## 2017-08-27 ENCOUNTER — Other Ambulatory Visit: Payer: Self-pay

## 2017-09-29 ENCOUNTER — Telehealth: Payer: Self-pay

## 2017-09-29 NOTE — Telephone Encounter (Signed)
Called and spoke w/ pt. Told him that due to his ileostomy and results from his vascular disease he is not a transplant candidate at our facility. Pt stated that he has had the ileostomy for >40 yrs, but then sid well if that's your policy then that is it. Pt had no other questions. Letter sent to pt, PCP, nephrologist and HD unit. Darcel Smalling, RN

## 2017-09-29 NOTE — Progress Notes (Signed)
Kidney Transplant Selection Committee Meeting    Presenting:WS    Reason for presenting:CT review    Team discussion:ileostomy.  Not a candidate due multiple medical comorbidities.

## 2017-10-13 ENCOUNTER — Other Ambulatory Visit: Payer: Self-pay

## 2017-10-13 ENCOUNTER — Emergency Department (HOSPITAL_COMMUNITY): Payer: Medicare Other

## 2017-10-13 ENCOUNTER — Inpatient Hospital Stay (HOSPITAL_COMMUNITY): Payer: Medicare Other

## 2017-10-13 ENCOUNTER — Inpatient Hospital Stay (HOSPITAL_COMMUNITY)
Admission: EM | Admit: 2017-10-13 | Discharge: 2017-10-21 | DRG: 628 | Disposition: A | Discharge status: Home Health | Payer: Medicare Other | Attending: Internal Medicine | Admitting: Internal Medicine

## 2017-10-13 DIAGNOSIS — N452 Orchitis: Secondary | ICD-10-CM | POA: Diagnosis not present

## 2017-10-13 DIAGNOSIS — T82858A Stenosis of vascular prosthetic devices, implants and grafts, initial encounter: Secondary | ICD-10-CM | POA: Diagnosis not present

## 2017-10-13 DIAGNOSIS — E871 Hypo-osmolality and hyponatremia: Secondary | ICD-10-CM | POA: Diagnosis not present

## 2017-10-13 DIAGNOSIS — Z8249 Family history of ischemic heart disease and other diseases of the circulatory system: Secondary | ICD-10-CM

## 2017-10-13 DIAGNOSIS — R404 Transient alteration of awareness: Secondary | ICD-10-CM | POA: Diagnosis not present

## 2017-10-13 DIAGNOSIS — G629 Polyneuropathy, unspecified: Secondary | ICD-10-CM | POA: Diagnosis present

## 2017-10-13 DIAGNOSIS — H5461 Unqualified visual loss, right eye, normal vision left eye: Secondary | ICD-10-CM | POA: Diagnosis present

## 2017-10-13 DIAGNOSIS — Z9049 Acquired absence of other specified parts of digestive tract: Secondary | ICD-10-CM | POA: Diagnosis not present

## 2017-10-13 DIAGNOSIS — G92 Toxic encephalopathy: Secondary | ICD-10-CM | POA: Diagnosis present

## 2017-10-13 DIAGNOSIS — R001 Bradycardia, unspecified: Secondary | ICD-10-CM | POA: Diagnosis present

## 2017-10-13 DIAGNOSIS — R531 Weakness: Secondary | ICD-10-CM

## 2017-10-13 DIAGNOSIS — I120 Hypertensive chronic kidney disease with stage 5 chronic kidney disease or end stage renal disease: Secondary | ICD-10-CM | POA: Diagnosis present

## 2017-10-13 DIAGNOSIS — K219 Gastro-esophageal reflux disease without esophagitis: Secondary | ICD-10-CM | POA: Diagnosis present

## 2017-10-13 DIAGNOSIS — R52 Pain, unspecified: Secondary | ICD-10-CM | POA: Diagnosis not present

## 2017-10-13 DIAGNOSIS — E1122 Type 2 diabetes mellitus with diabetic chronic kidney disease: Secondary | ICD-10-CM | POA: Diagnosis present

## 2017-10-13 DIAGNOSIS — Z87891 Personal history of nicotine dependence: Secondary | ICD-10-CM

## 2017-10-13 DIAGNOSIS — E861 Hypovolemia: Secondary | ICD-10-CM | POA: Diagnosis not present

## 2017-10-13 DIAGNOSIS — W19XXXA Unspecified fall, initial encounter: Secondary | ICD-10-CM | POA: Diagnosis present

## 2017-10-13 DIAGNOSIS — Z8546 Personal history of malignant neoplasm of prostate: Secondary | ICD-10-CM

## 2017-10-13 DIAGNOSIS — I69998 Other sequelae following unspecified cerebrovascular disease: Secondary | ICD-10-CM

## 2017-10-13 DIAGNOSIS — I4510 Unspecified right bundle-branch block: Secondary | ICD-10-CM | POA: Diagnosis present

## 2017-10-13 DIAGNOSIS — I959 Hypotension, unspecified: Secondary | ICD-10-CM | POA: Diagnosis not present

## 2017-10-13 DIAGNOSIS — I2510 Atherosclerotic heart disease of native coronary artery without angina pectoris: Secondary | ICD-10-CM | POA: Diagnosis present

## 2017-10-13 DIAGNOSIS — Z955 Presence of coronary angioplasty implant and graft: Secondary | ICD-10-CM | POA: Diagnosis not present

## 2017-10-13 DIAGNOSIS — N186 End stage renal disease: Secondary | ICD-10-CM

## 2017-10-13 DIAGNOSIS — D72829 Elevated white blood cell count, unspecified: Secondary | ICD-10-CM | POA: Diagnosis not present

## 2017-10-13 DIAGNOSIS — Z794 Long term (current) use of insulin: Secondary | ICD-10-CM

## 2017-10-13 DIAGNOSIS — S0081XA Abrasion of other part of head, initial encounter: Secondary | ICD-10-CM | POA: Diagnosis present

## 2017-10-13 DIAGNOSIS — N2581 Secondary hyperparathyroidism of renal origin: Secondary | ICD-10-CM | POA: Diagnosis present

## 2017-10-13 DIAGNOSIS — Z933 Colostomy status: Secondary | ICD-10-CM

## 2017-10-13 DIAGNOSIS — E875 Hyperkalemia: Principal | ICD-10-CM | POA: Diagnosis present

## 2017-10-13 DIAGNOSIS — I9589 Other hypotension: Secondary | ICD-10-CM | POA: Diagnosis not present

## 2017-10-13 DIAGNOSIS — G9341 Metabolic encephalopathy: Secondary | ICD-10-CM | POA: Diagnosis not present

## 2017-10-13 DIAGNOSIS — Z992 Dependence on renal dialysis: Secondary | ICD-10-CM | POA: Diagnosis not present

## 2017-10-13 DIAGNOSIS — Z9079 Acquired absence of other genital organ(s): Secondary | ICD-10-CM | POA: Diagnosis not present

## 2017-10-13 DIAGNOSIS — R269 Unspecified abnormalities of gait and mobility: Secondary | ICD-10-CM | POA: Diagnosis present

## 2017-10-13 DIAGNOSIS — R262 Difficulty in walking, not elsewhere classified: Secondary | ICD-10-CM | POA: Diagnosis not present

## 2017-10-13 DIAGNOSIS — N5082 Scrotal pain: Secondary | ICD-10-CM | POA: Diagnosis not present

## 2017-10-13 DIAGNOSIS — Z96641 Presence of right artificial hip joint: Secondary | ICD-10-CM | POA: Diagnosis present

## 2017-10-13 LAB — COMPREHENSIVE METABOLIC PANEL
ALBUMIN: 3.2 g/dL — AB (ref 3.5–5.0)
ALK PHOS: 61 U/L (ref 38–126)
ALT: 18 U/L (ref 17–63)
ANION GAP: 15 (ref 5–15)
AST: 37 U/L (ref 15–41)
BILIRUBIN TOTAL: 2 mg/dL — AB (ref 0.3–1.2)
BUN: 46 mg/dL — ABNORMAL HIGH (ref 6–20)
CALCIUM: 10.4 mg/dL — AB (ref 8.9–10.3)
CO2: 14 mmol/L — AB (ref 22–32)
CREATININE: 12.28 mg/dL — AB (ref 0.61–1.24)
Chloride: 107 mmol/L (ref 101–111)
GFR calc Af Amer: 4 mL/min — ABNORMAL LOW (ref >60)
GFR calc non Af Amer: 4 mL/min — ABNORMAL LOW (ref >60)
GLUCOSE: 210 mg/dL — AB (ref 65–99)
Potassium: >7.5 mmol/L (ref 3.5–5.1)
Sodium: 136 mmol/L (ref 135–145)
TOTAL PROTEIN: 6.1 g/dL — AB (ref 6.5–8.1)

## 2017-10-13 LAB — GLUCOSE, CAPILLARY
GLUCOSE-CAPILLARY: 215 mg/dL — AB (ref 65–99)
Glucose-Capillary: 99 mg/dL (ref 65–99)

## 2017-10-13 LAB — CBC WITH DIFFERENTIAL/PLATELET
BASOS ABS: 0 10*3/uL (ref 0.0–0.1)
BASOS PCT: 0 %
EOS ABS: 0.1 10*3/uL (ref 0.0–0.7)
Eosinophils Relative: 1 %
HCT: 38.1 % — ABNORMAL LOW (ref 39.0–52.0)
Hemoglobin: 11.7 g/dL — ABNORMAL LOW (ref 13.0–17.0)
LYMPHS PCT: 7 %
Lymphs Abs: 0.6 10*3/uL — ABNORMAL LOW (ref 0.7–4.0)
MCH: 29.8 pg (ref 26.0–34.0)
MCHC: 30.7 g/dL (ref 30.0–36.0)
MCV: 97.2 fL (ref 78.0–100.0)
Monocytes Absolute: 0.1 10*3/uL (ref 0.1–1.0)
Monocytes Relative: 2 %
Neutro Abs: 7.6 10*3/uL (ref 1.7–7.7)
Neutrophils Relative %: 90 %
Platelets: 88 10*3/uL — ABNORMAL LOW (ref 150–400)
RBC: 3.92 MIL/uL — AB (ref 4.22–5.81)
RDW: 20.2 % — ABNORMAL HIGH (ref 11.5–15.5)
WBC: 8.4 10*3/uL (ref 4.0–10.5)

## 2017-10-13 LAB — PHOSPHORUS: Phosphorus: 8.1 mg/dL — ABNORMAL HIGH (ref 2.5–4.6)

## 2017-10-13 LAB — TROPONIN I
Troponin I: 0.07 ng/mL (ref <0.03)
Troponin I: 0.09 ng/mL (ref <0.03)

## 2017-10-13 LAB — CBG MONITORING, ED: GLUCOSE-CAPILLARY: 192 mg/dL — AB (ref 65–99)

## 2017-10-13 LAB — MRSA PCR SCREENING: MRSA by PCR: NEGATIVE

## 2017-10-13 MED ORDER — SODIUM CHLORIDE 0.9% FLUSH
3.0000 mL | Freq: Two times a day (BID) | INTRAVENOUS | Status: DC
  Administered 2017-10-13 – 2017-10-17 (×7): 3 mL via INTRAVENOUS

## 2017-10-13 MED ORDER — CALCIUM GLUCONATE 10 % IV SOLN
1.0000 g | Freq: Once | INTRAVENOUS | Status: DC
  Filled 2017-10-13: qty 10

## 2017-10-13 MED ORDER — DORZOLAMIDE HCL-TIMOLOL MAL 2-0.5 % OP SOLN
1.0000 [drp] | Freq: Two times a day (BID) | OPHTHALMIC | Status: DC
  Administered 2017-10-13 – 2017-10-21 (×14): 1 [drp] via OPHTHALMIC
  Filled 2017-10-13 (×2): qty 10

## 2017-10-13 MED ORDER — SODIUM POLYSTYRENE SULFONATE 15 GM/60ML PO SUSP
30.0000 g | Freq: Once | ORAL | Status: AC
  Administered 2017-10-13: 30 g via ORAL
  Filled 2017-10-13: qty 120

## 2017-10-13 MED ORDER — ORAL CARE MOUTH RINSE
15.0000 mL | Freq: Two times a day (BID) | OROMUCOSAL | Status: DC
  Administered 2017-10-13 – 2017-10-20 (×11): 15 mL via OROMUCOSAL

## 2017-10-13 MED ORDER — SODIUM CHLORIDE 0.9 % IV SOLN: 250.0000 mL | INTRAVENOUS | Status: DC | PRN

## 2017-10-13 MED ORDER — SODIUM CHLORIDE 0.9% FLUSH
3.0000 mL | INTRAVENOUS | Status: DC | PRN
  Administered 2017-10-17: 3 mL via INTRAVENOUS
  Filled 2017-10-13: qty 3

## 2017-10-13 MED ORDER — CALCIUM CHLORIDE 10 % IV SOLN: 1.0000 g | Freq: Once | INTRAVENOUS | Status: DC

## 2017-10-13 MED ORDER — INSULIN ASPART 100 UNIT/ML IV SOLN
5.0000 [IU] | Freq: Once | INTRAVENOUS | Status: AC
  Administered 2017-10-13: 5 [IU] via INTRAVENOUS
  Filled 2017-10-13: qty 0.05

## 2017-10-13 MED ORDER — INSULIN ASPART 100 UNIT/ML ~~LOC~~ SOLN
0.0000 [IU] | Freq: Three times a day (TID) | SUBCUTANEOUS | Status: DC
  Administered 2017-10-15: 3 [IU] via SUBCUTANEOUS
  Administered 2017-10-16 (×2): 1 [IU] via SUBCUTANEOUS
  Administered 2017-10-17: 2 [IU] via SUBCUTANEOUS
  Administered 2017-10-17: 7 [IU] via SUBCUTANEOUS
  Administered 2017-10-18 – 2017-10-21 (×8): 2 [IU] via SUBCUTANEOUS

## 2017-10-13 MED ORDER — DOXERCALCIFEROL 4 MCG/2ML IV SOLN
2.0000 ug | INTRAVENOUS | Status: DC
  Administered 2017-10-15 – 2017-10-20 (×3): 2 ug via INTRAVENOUS
  Filled 2017-10-13 (×3): qty 2

## 2017-10-13 MED ORDER — DEXTROSE 50 % IV SOLN
1.0000 | Freq: Once | INTRAVENOUS | Status: AC
  Administered 2017-10-13: 50 mL via INTRAVENOUS
  Filled 2017-10-13: qty 50

## 2017-10-13 MED ORDER — CALCIUM CHLORIDE 10 % IV SOLN
INTRAVENOUS | Status: AC | PRN
  Administered 2017-10-13: 1 g via INTRAVENOUS

## 2017-10-13 MED ORDER — DOXERCALCIFEROL 4 MCG/2ML IV SOLN
INTRAVENOUS | Status: AC
  Filled 2017-10-13: qty 2

## 2017-10-13 MED ORDER — ALBUTEROL SULFATE (2.5 MG/3ML) 0.083% IN NEBU
10.0000 mg | INHALATION_SOLUTION | Freq: Once | RESPIRATORY_TRACT | Status: AC
  Administered 2017-10-13: 10 mg via RESPIRATORY_TRACT
  Filled 2017-10-13: qty 12

## 2017-10-13 MED ORDER — SODIUM CHLORIDE 0.9 % IV SOLN
1.0000 g | Freq: Once | INTRAVENOUS | Status: AC
  Administered 2017-10-13: 1 g via INTRAVENOUS
  Filled 2017-10-13: qty 10

## 2017-10-13 MED ORDER — LATANOPROST 0.005 % OP SOLN
1.0000 [drp] | Freq: Every day | OPHTHALMIC | Status: DC
  Administered 2017-10-13: 1 [drp] via OPHTHALMIC
  Filled 2017-10-13: qty 2.5

## 2017-10-13 MED ORDER — SEVELAMER CARBONATE 800 MG PO TABS
1600.0000 mg | ORAL_TABLET | Freq: Three times a day (TID) | ORAL | Status: DC
  Administered 2017-10-14 – 2017-10-21 (×17): 1600 mg via ORAL
  Filled 2017-10-13 (×20): qty 2

## 2017-10-13 MED ORDER — SODIUM BICARBONATE 8.4 % IV SOLN
INTRAVENOUS | Status: AC | PRN
  Administered 2017-10-13: 100 meq via INTRAVENOUS
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
## 2017-10-13 NOTE — Progress Notes (Signed)
CRITICAL VALUE ALERT  Critical Value:  Trop-0.07, K->7.5  Date & Time Notied:  7:29 PM 10/13/2017   Provider Notified: Dr. Nelda Marseille  Orders Received/Actions taken: Pt currently on HD, serial troponins ordered.

## 2017-10-13 NOTE — Consult Note (Signed)
[facility] Consultation Note Requesting Physician:  EDP Primary Nephrologist: [facility] Reason for Consult:  ESRD pt with life threatening hyperkalemia  HPI: The patient is a 72 y.o. year-old WM. ESRD for 3 years (he thinks) on MWF at [facility]. Called EMS himself - brought to ED today with profound bradycardia, found to have wide complex rhythm, istat K>8.5. Abrasion on his head he has no idea how he got. Says got full 4 hour HD on Friday. Has RUE AVG says no problem last HD. Has had to have "maintenance" on his AVG in the past. Had kayexalate, albuterol, Ca chloride and gluconate, insulin. Externally paced by EMS en route X2 "woke him up".   Past Medical History:  Diagnosis Date  . Arthritis   . 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 Cancer ([place])    "low grade"  . CKD (chronic kidney disease)   . DM2 (diabetes mellitus, type 2) ([place])   . ESRD (end stage renal disease) ([place])   . History of benign colon tumor     Past Surgical History:  Procedure Laterality Date  . AV FISTULA PLACEMENT Right 01/30/2016   Procedure: Brachial vein transposistion first stage, right ;  Surgeon: Conrad Riverview, MD;  Location: [place];  Service: Vascular;  Laterality: Right;  . AV FISTULA PLACEMENT Right 05/21/2016   Procedure: INSERTION OF 4-7MM X 45CM ARTERIOVENOUS (AV) GORE-TEX GRAFT ARM RIGHT UPPER ARM;  Surgeon: Conrad Annapolis Neck, MD;  Location: [place];  Service: Vascular;  Laterality: Right;  . COLECTOMY    . COLONOSCOPY    . CYSTOSCOPY    . EYE SURGERY Left    cararact with lens  . HIP ARTHROPLASTY Right 07/14/2014   Procedure: ARTHROPLASTY BIPOLAR HIP;  Surgeon: Rozanna Box, MD;  Location: [place];  Service: Orthopedics;  Laterality: Right;  . LIGATION OF ARTERIOVENOUS  FISTULA Right 05/21/2016   Procedure: LIGATION OF BRACHIAL VEIN TRANSPOSITION UPPER ARM;  Surgeon: Conrad Wanaque, MD;  Location: [place];  Service: Vascular;  Laterality: Right;  . THORACIC AORTIC ENDOVASCULAR STENT GRAFT N/A  07/12/2014   Procedure: THORACIC AORTIC ENDOVASCULAR STENT GRAFT;  Surgeon: Serafina Mitchell, MD;  Location: [place];  Service: Vascular;  Laterality: N/A;  . WHIPPLE PROCEDURE  1990's    Family History: No family history on file. Social History:  reports that he quit smoking about 11 years ago. His smoking use included cigarettes. He quit after 40.00 years of use. he has never used smokeless tobacco. He reports that he does not drink alcohol or use drugs.   Allergies  Allergen Reactions  . Bactrim [Sulfamethoxazole-Trimethoprim] Other (See Comments)    States muscle weakness for 2 days - unable to stand "it like to have killed me"   . Lisinopril Other (See Comments)    Weakness  renal function abnormalities  . Metformin Anaphylaxis    GI Upset (intolerance) "it like to have killed me I couldn't get out of my chair for three days"    HOME MEDICATIONS ?? - PT CANNOT REMEMBER Prior to Admission medications   Medication Sig Start Date End Date Taking? Authorizing Provider  dorzolamide-timolol (COSOPT) 22.3-6.8 MG/ML ophthalmic solution Place 1 drop into the left eye 2 (two) times daily. 04/19/14   [provider]  HYDROcodone-acetaminophen (NORCO/VICODIN) 5-325 MG tablet Take 1-2 tablets by mouth every 6 (six) hours as needed for moderate pain. 05/21/16   Virgina Jock A, PA-C  insulin detemir (LEVEMIR) 100  UNIT/ML injection Inject 0.3 mLs (30 Units total) into the skin daily. 05/21/16   Alvia Grove, PA-C  insulin lispro (HUMALOG) 100 UNIT/ML injection Inject 0-15 Units into the skin 3 (three) times daily with meals. Per sliding scale    [provider]  latanoprost (XALATAN) 0.005 % ophthalmic solution 1 drop in left eye nightly.    [provider]  Multiple Vitamin (MULTIVITAMIN WITH MINERALS) TABS tablet Take 1 tablet by mouth daily.    [provider]  multivitamin (RENA-VIT) TABS tablet Take 1 tablet by mouth at bedtime. 01/31/16   Lavina Hamman, MD  oxybutynin (DITROPAN) 5 MG tablet Take 5 mg by mouth every 8 (eight) hours as needed for bladder spasms.  03/15/16   [provider]  sevelamer carbonate (RENVELA) 800 MG tablet Take 2 tablets (1,600 mg total) by mouth 3 (three) times daily with meals. 01/31/16   Lavina Hamman, MD    Inpatient medications: . calcium chloride  1 g Intravenous Once  . sodium polystyrene  30 g Oral Once    Review of Systems Denies CP, SOB, abd pain Does not remember any falls Otherwise just says "I can't remember"  Physical Exam:  Blood pressure (!) 176/67, pulse (!) 51, resp. rate 16, height 5\' 9"  (1.753 m), weight 77.1 kg (170 lb), SpO2 100 %.  Pale app WM Abrasion on forehead NAD Lethargic Lungs grossly clear Bradycardic S1S2 No S3 Abd colostomy in place Minimal LE edema RUE AVG bruit high pitched lower aspect of AVG Neuro oriented X3 but not sure if fell (abrasion head)  istat K >8.5  Recent Labs  Lab 10/13/17 1454  GLUCAP 192*    Xrays/Other Studies: Dg Chest Portable 1 View  Result Date: 10/13/2017 CLINICAL DATA:  Bradycardia EXAM: PORTABLE CHEST 1 VIEW COMPARISON:  01/22/2016 FINDINGS: AP portable semi upright view of the chest. Borderline cardiomegaly is noted with aortic stent graft in place, unchanged in appearance. Mild vascular congestion is noted without acute pneumonic consolidation. No effusion or pneumothorax. A right upper extremity vascular stent is now noted. The dialysis catheter is no longer present. External cardiac monitoring leads and defibrillator paddles are noted. There is no acute osseous abnormality. IMPRESSION: Mild vascular congestion with borderline cardiomegaly is noted. Electronically Signed   By: Ashley Royalty M.D.   On: 10/13/2017 15:00   Dialysis prescription MWF [place] (lives in [place]) [place] 4 hours EDW 70 450/A1.5 2K2.5 Ca Heparin 2000 bolus Hectorol 2 mcg No ESA or iron  Assessment/Recommendations  1. ESRD - MWF  HD. K>8.5 w/profound bradycardia. No missed TMT's. Pt not able to give details. For urgent HD today. Has patent AVG. Several lab results still pending. 2. Life threatening hyperkalemia - urgent HD as soon as can get bed in ICU or SDU. 1K bath. Check K pre.  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 Encephalopathy - pt lives alone, drives self to dialysis - presume (hope) current lethargy and inability to recall details of history is acute related to current events - CT scan of brain pending 5. H/o colectomy w/colostomy 6. Whipple's procedure 1996 7. DM2   Jamal Maes,  MD [facility] [phone] pager 10/13/2017, 3:14 PM

## 2017-10-13 NOTE — ED Notes (Signed)
Nephrology @Bedside 

## 2017-10-13 NOTE — H&P (Addendum)
PULMONARY / CRITICAL CARE MEDICINE   Name: Travis Carlson MRN: [id number] DOB: 1946-02-22    ADMISSION DATE:  10/13/2017 CONSULTATION DATE:  10/13/2017  REFERRING MD:  Dr. Alvino Chapel  CHIEF COMPLAINT:  Hyperkalemia  HISTORY OF PRESENT ILLNESS:   72 year old male with complex medical history as below, which is significant for ESRD on HD via R AVG on MWF (last treatment was 3/1), DM, CAD, FAP s/p subtotal colectomy and ostomy, and prostate Ca s/p TURP in 2015. He has had several recent ED visits in the [facility] system due to hyperkalemia treated with urgent HD. He presented to [facility] ED 3/4 after suffering a fall. He was found to be bradycardic requiring transcutaneous pacing. iSTAT labs in the emergency department revealed elevated potassium. He was treated with calcium, bicarb, insulin, and albuterol. HR improved and pacing was stopped. He was seen by nephrology who would like to take him for HD. PCCM asked to admit.   PAST MEDICAL HISTORY :  He  has a past medical history of Arthritis, Blind right eye, Cancer ([place]), CKD (chronic kidney disease), DM2 (diabetes mellitus, type 2) ([place]), ESRD (end stage renal disease) ([place]), and History of benign colon tumor.  PAST SURGICAL HISTORY: He  has a past surgical history that includes Colectomy; Thoracic aortic endovascular stent graft (N/A, 07/12/2014); Hip Arthroplasty (Right, 07/14/2014); AV fistula placement (Right, 01/30/2016); Eye surgery (Left); Colonoscopy; Whipple procedure (1990's); Cystoscopy; AV fistula placement (Right, 05/21/2016); and Ligation of arteriovenous  fistula (Right, 05/21/2016).  Allergies  Allergen Reactions  . Bactrim [Sulfamethoxazole-Trimethoprim] Other (See Comments)    States muscle weakness for 2 days - unable to stand "it like to have killed me"   . Lisinopril Other (See Comments)    Weakness  renal function abnormalities  . Metformin Anaphylaxis    GI Upset (intolerance) "it like to have killed me I couldn't get out of  my chair for three days"    No current facility-administered medications on file prior to encounter.    Current Outpatient Medications on File Prior to Encounter  Medication Sig  . dorzolamide-timolol (COSOPT) 22.3-6.8 MG/ML ophthalmic solution Place 1 drop into the left eye 2 (two) times daily.  Marland Kitchen HYDROcodone-acetaminophen (NORCO/VICODIN) 5-325 MG tablet Take 1-2 tablets by mouth every 6 (six) hours as needed for moderate pain.  Marland Kitchen insulin detemir (LEVEMIR) 100 UNIT/ML injection Inject 0.3 mLs (30 Units total) into the skin daily.  . insulin lispro (HUMALOG) 100 UNIT/ML injection Inject 0-15 Units into the skin 3 (three) times daily with meals. Per sliding scale  . latanoprost (XALATAN) 0.005 % ophthalmic solution 1 drop in left eye nightly.  . Multiple Vitamin (MULTIVITAMIN WITH MINERALS) TABS tablet Take 1 tablet by mouth daily.  . multivitamin (RENA-VIT) TABS tablet Take 1 tablet by mouth at bedtime.  Marland Kitchen oxybutynin (DITROPAN) 5 MG tablet Take 5 mg by mouth every 8 (eight) hours as needed for bladder spasms.   . sevelamer carbonate (RENVELA) 800 MG tablet Take 2 tablets (1,600 mg total) by mouth 3 (three) times daily with meals.    FAMILY HISTORY:  His has no family status information on file.    SOCIAL HISTORY: He  reports that he quit smoking about 11 years ago. His smoking use included cigarettes. He quit after 40.00 years of use. he has never used smokeless tobacco. He reports that he does not drink alcohol or use drugs.  REVIEW OF SYSTEMS:   Limited due to encephalopathy Bolds are positive  Constitutional: weight loss, gain,  night sweats, Fevers, chills, fatigue .  HEENT: headaches, Sore throat, sneezing, nasal congestion, post nasal drip, Difficulty swallowing, Tooth/dental problems, visual complaints visual changes, ear ache CV:  chest pain, radiates:,Orthopnea, PND, swelling in lower extremities, dizziness, palpitations, syncope.  GI  heartburn, indigestion, abdominal pain,  nausea, vomiting, diarrhea, change in bowel habits, loss of appetite, bloody stools.  Resp: cough, productive: , hemoptysis, dyspnea, chest pain, pleuritic.  Skin: rash or itching or icterus GU: dysuria, change in color of urine, urgency or frequency. flank pain, hematuria  MS: joint pain or swelling. decreased range of motion  Psych: change in mood or affect. depression or anxiety.  Neuro: difficulty with speech, weakness, numbness, ataxia    SUBJECTIVE:    VITAL SIGNS: BP (!) 176/67   Pulse (!) 51   Resp 16   Ht 5\' 9"  (1.753 m)   Wt 77.1 kg (170 lb)   SpO2 100%   BMI 25.10 kg/m        PHYSICAL EXAMINATION: General:  Chronically ill appearing elderly male in mild distress Neuro:  Alert, oriented to self.  HEENT:  Normocephalic, fresh dried blood and scabbing over L eye. Cardiovascular:  Loletha Grayer, wide complex on monitor. Rate in 40s to 50s.  Lungs:  Clear bilateral breath sounds Abdomen:  Soft, non-tender, non-distended. Ostomy in place.  Musculoskeletal:  No acute deformity or ROM limitation Skin:  Grossly intact  LABS:  BMET No results for input(s): NA, K, CL, CO2, BUN, CREATININE, GLUCOSE in the last 168 hours.  Electrolytes No results for input(s): CALCIUM, MG, PHOS in the last 168 hours.  CBC No results for input(s): WBC, HGB, HCT, PLT in the last 168 hours.  Coag's No results for input(s): APTT, INR in the last 168 hours.  Sepsis Markers No results for input(s): LATICACIDVEN, PROCALCITON, O2SATVEN in the last 168 hours.  ABG No results for input(s): PHART, PCO2ART, PO2ART in the last 168 hours.  Liver Enzymes No results for input(s): AST, ALT, ALKPHOS, BILITOT, ALBUMIN in the last 168 hours.  Cardiac Enzymes No results for input(s): TROPONINI, PROBNP in the last 168 hours.  Glucose Recent Labs  Lab 10/13/17 1454  GLUCAP 192*    Imaging Dg Chest Portable 1 View  Result Date: 10/13/2017 CLINICAL DATA:  Bradycardia EXAM: PORTABLE CHEST 1 VIEW  COMPARISON:  01/22/2016 FINDINGS: AP portable semi upright view of the chest. Borderline cardiomegaly is noted with aortic stent graft in place, unchanged in appearance. Mild vascular congestion is noted without acute pneumonic consolidation. No effusion or pneumothorax. A right upper extremity vascular stent is now noted. The dialysis catheter is no longer present. External cardiac monitoring leads and defibrillator paddles are noted. There is no acute osseous abnormality. IMPRESSION: Mild vascular congestion with borderline cardiomegaly is noted. Electronically Signed   By: Ashley Royalty M.D.   On: 10/13/2017 15:00     STUDIES:  CT head 3/4 >   CULTURES:   ANTIBIOTICS:   SIGNIFICANT EVENTS: 3/1 last HD tx 3/4 admit with hyperkalemia and urgent HD  LINES/TUBES: AV graft RUE > IO L tibia 3/4 >  DISCUSSION: 72 year old male with ESRD on HD MWF. Last tx Fri 3/1, missed HD 3/4 and presented same day with bradycardia. Profound hyperkalemia admitted for urgent HD.   ASSESSMENT / PLAN:  PULMONARY A: No acute issues  P:   Supplemental O2 PRN Monitor  CARDIOVASCULAR A:  Symptomatic bradycardia: secondary hyperkalemia History of ascending aortic aneurysm, CAD  P:  Pacing now off See renal  section below  RENAL A:   Hyperkalemia ESRD on HD  P:   Temporized in the ED Kayexalate given PO in ED Nephrology planning for HD once arrives on the unit Serial BMP  GASTROINTESTINAL A:   No acute issues  P:   NPO for now  HEMATOLOGIC A:   No acute issues  P:  CBC, coags pending  INFECTIOUS A:   No acute issues  P:   Monitor off ABX  ENDOCRINE A:   DM    P:   CBG monitoring and SSI  NEUROLOGIC A:   Acute metabolic encephalopathy  P:   RASS goal: 0 Monitor   FAMILY  - Updates: Patient updated in ED, no family available  - Inter-disciplinary family meet or Palliative Care meeting due by:  3/11   Georgann Housekeeper, AGACNP-BC [place] [facility]  [facility] Pager [phone] or [phone]  10/13/2017 4:22 PM   ----------------------   ^^^^^^^^^^ I have seen and examined the patient. Agree with APP's Assessment and plan. Please see my comments as follows.    72 y/o male with ESRD, HTN, Anemia, got HD  2 days ago, brought to ED by EMS with pacing for symptomatic junctional bradycardia; [place] for life threatening HyperK.  Seen by Renal; awaiting urgent HD in ICU. Pt is poor historian, unaware of how he got blood on his face. No known seizure, injury, fever.  PE Elderly male, awake, slow to respond,  bp 190/80, hr 50s, junctional rhythm, afebrile, 99%,  right eye- phthisis bulbi; left pupil regular, round. , bruit+ R AVG,  s1 s2 regular, no rales/rhonchi,  abd-ostomy bag,  no edema,  weak in Upper limbs, good strength in feet.    K > 9.5 on istat CBC Latest Ref Rng & Units 10/13/2017 05/21/2016 01/31/2016  WBC 4.0 - 10.5 K/uL 8.4 - 6.0  Hemoglobin 13.0 - 17.0 g/dL 11.7(L) 11.6(L) 9.7(L)  Hematocrit 39.0 - 52.0 % 38.1(L) 34.0(L) 31.0(L)  Platelets 150 - 400 K/uL PENDING - 101(L)    10-13-2017 EKG Junctional bradycardia/  Slow A fib, wide QRS, prolonged QTC, Tall T waves   cxr 10-13-2017    AP portable semi upright view of the chest. Borderline cardiomegaly is noted with aortic stent graft in place, unchanged in appearance. Mild vascular congestion is noted without acute pneumonic consolidation. No effusion or pneumothorax. A right upper extremity vascular stent is now noted. The dialysis catheter is no longer present. External cardiac monitoring leads and defibrillator paddles are noted. There is no acute osseous abnormality.    Assessment-Plan-  HyperK,  ESRD  ?Head trauma- UE weakness  HTN   Urgent HD, correct K, f/u lytes  Renal f/u    F/u Head CT    ICU care  D/w ICU team  Thank you for letting me participate in the care of your patient. ^^^^^^^^^^ I  Have personally spent   50   Minutes  In the care of this Patient providing Critical care Services; Time includes review of chart, labs, imaging, coordinating care with other physicians and healthcare team members. Also includes time for frequent reevaluation and additional treatment implementation due to change in clinical condiiton of patient. Excludes time spent for Procedure and Teaching.  ^^^^^^^^^^ Note subject to typographical and grammatical errors;   Any formal questions or concerns about the content, text, or information contained within the body of this dictation should be directly addressed to the physician  for  clarification.  Evans Lance, MD Pulmonary and  [place] Pager: [phone]

## 2017-10-13 NOTE — ED Triage Notes (Signed)
See RR Narrator

## 2017-10-14 DIAGNOSIS — E875 Hyperkalemia: Principal | ICD-10-CM

## 2017-10-14 LAB — CBC
HEMATOCRIT: 41.1 % (ref 39.0–52.0)
Hemoglobin: 13.3 g/dL (ref 13.0–17.0)
MCH: 30.4 pg (ref 26.0–34.0)
MCHC: 32.4 g/dL (ref 30.0–36.0)
MCV: 94.1 fL (ref 78.0–100.0)
Platelets: 102 10*3/uL — ABNORMAL LOW (ref 150–400)
RBC: 4.37 MIL/uL (ref 4.22–5.81)
RDW: 19.5 % — AB (ref 11.5–15.5)
WBC: 11.8 10*3/uL — AB (ref 4.0–10.5)

## 2017-10-14 LAB — GLUCOSE, CAPILLARY
GLUCOSE-CAPILLARY: 66 mg/dL (ref 65–99)
GLUCOSE-CAPILLARY: 142 mg/dL — AB (ref 65–99)
Glucose-Capillary: 174 mg/dL — ABNORMAL HIGH (ref 65–99)
Glucose-Capillary: 36 mg/dL — CL (ref 65–99)
Glucose-Capillary: 90 mg/dL (ref 65–99)
Glucose-Capillary: 112 mg/dL — ABNORMAL HIGH (ref 65–99)

## 2017-10-14 LAB — RENAL FUNCTION PANEL
ANION GAP: 12 (ref 5–15)
Albumin: 3.1 g/dL — ABNORMAL LOW (ref 3.5–5.0)
BUN: 20 mg/dL (ref 6–20)
CO2: 31 mmol/L (ref 22–32)
Calcium: 8.9 mg/dL (ref 8.9–10.3)
Chloride: 94 mmol/L — ABNORMAL LOW (ref 101–111)
Creatinine, Ser: 7.61 mg/dL — ABNORMAL HIGH (ref 0.61–1.24)
GFR, EST AFRICAN AMERICAN: 7 mL/min — AB (ref >60)
GFR, EST NON AFRICAN AMERICAN: 6 mL/min — AB (ref >60)
Glucose, Bld: 113 mg/dL — ABNORMAL HIGH (ref 65–99)
PHOSPHORUS: 6.2 mg/dL — AB (ref 2.5–4.6)
Potassium: 6.7 mmol/L (ref 3.5–5.1)
Sodium: 137 mmol/L (ref 135–145)

## 2017-10-14 LAB — BASIC METABOLIC PANEL
Anion gap: 15 (ref 5–15)
BUN: 15 mg/dL (ref 6–20)
CO2: 30 mmol/L (ref 22–32)
CREATININE: 6.62 mg/dL — AB (ref 0.61–1.24)
Calcium: 9.3 mg/dL (ref 8.9–10.3)
Chloride: 96 mmol/L — ABNORMAL LOW (ref 101–111)
GFR calc non Af Amer: 7 mL/min — ABNORMAL LOW (ref >60)
GFR, EST AFRICAN AMERICAN: 9 mL/min — AB (ref >60)
Glucose, Bld: 21 mg/dL — CL (ref 65–99)
Potassium: 5 mmol/L (ref 3.5–5.1)
Sodium: 141 mmol/L (ref 135–145)

## 2017-10-14 LAB — MAGNESIUM: Magnesium: 2 mg/dL (ref 1.7–2.4)

## 2017-10-14 LAB — TROPONIN I
TROPONIN I: 0.13 ng/mL — AB (ref <0.03)
Troponin I: 0.12 ng/mL (ref <0.03)

## 2017-10-14 MED ORDER — DEXTROSE 50 % IV SOLN
50.0000 mL | Freq: Once | INTRAVENOUS | Status: AC
  Administered 2017-10-14: 50 mL via INTRAVENOUS

## 2017-10-14 MED ORDER — DIPHENHYDRAMINE HCL 25 MG PO CAPS
ORAL_CAPSULE | ORAL | Status: AC
  Administered 2017-10-14: 25 mg via ORAL
  Filled 2017-10-14: qty 1

## 2017-10-14 MED ORDER — HEPARIN SODIUM (PORCINE) 5000 UNIT/ML IJ SOLN
5000.0000 [IU] | Freq: Three times a day (TID) | INTRAMUSCULAR | Status: DC
  Administered 2017-10-14 – 2017-10-21 (×19): 5000 [IU] via SUBCUTANEOUS
  Filled 2017-10-14 (×22): qty 1

## 2017-10-14 MED ORDER — DEXTROSE 50 % IV SOLN
INTRAVENOUS | Status: AC
  Administered 2017-10-14: 50 mL via INTRAVENOUS
  Filled 2017-10-14: qty 50

## 2017-10-14 MED ORDER — DIPHENHYDRAMINE HCL 25 MG PO CAPS: 25.0000 mg | ORAL_CAPSULE | Freq: Once | ORAL | Status: DC

## 2017-10-14 MED ORDER — LATANOPROST 0.005 % OP SOLN
1.0000 [drp] | Freq: Every day | OPHTHALMIC | Status: DC
  Administered 2017-10-15 – 2017-10-20 (×6): 1 [drp] via OPHTHALMIC
  Filled 2017-10-14 (×2): qty 2.5

## 2017-10-14 MED ORDER — PANTOPRAZOLE SODIUM 40 MG PO TBEC
40.0000 mg | DELAYED_RELEASE_TABLET | Freq: Every day | ORAL | Status: DC
  Administered 2017-10-14 – 2017-10-21 (×8): 40 mg via ORAL
  Filled 2017-10-14 (×8): qty 1

## 2017-10-14 NOTE — Progress Notes (Signed)
Received call from lab with critical glucose-21.  CBG was checked-36.  1 amp D50 given IV.  Repeat CBG-174.  Will continue to monitor pt.

## 2017-10-14 NOTE — Progress Notes (Signed)
PULMONARY / CRITICAL CARE MEDICINE   Name: Travis Carlson MRN: [id number] DOB: 1946/05/25    ADMISSION DATE:  10/13/2017 CONSULTATION DATE:  10/13/2017  REFERRING MD:  Dr. Alvino Chapel  CHIEF COMPLAINT:  Hyperkalemia  HISTORY OF PRESENT ILLNESS:   72 yo male former smoker with hx of ESRD followed at [facility] and [facility] has several admissions for hyperkalemia.  He presented to [facility] after fall, and found to have hyperkalemia with bradycardia causing altered mental status.  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  SUBJECTIVE:  Still has some trouble with memory.  Denies headache, chest pain, dyspnea, nausea.  VITAL SIGNS: BP 128/63   Pulse 61   Temp 97.6 F (36.4 C) (Oral)   Resp 11   Ht 5\' 9"  (1.753 m)   Wt 152 lb 16 oz (69.4 kg)   SpO2 93%   BMI 22.59 kg/m    INTAKE/OUTPUT: I/O last 3 completed shifts: In: 103 [I.V.:53; Other:50] Out: 3125 [Other:1100; Stool:2025]   PHYSICAL EXAMINATION:  General - alert Eyes - blind Rt eye ENT - no sinus tenderness, no oral exudate, no LAN Cardiac - regular, no murmur Chest - no wheeze, rales Abd - soft, non tender, ostomy in place Ext - Rt arm AV graft Skin - no rashes Neuro - normal strength, follows commands   LABS:  BMET Recent Labs  Lab 10/13/17 1745 10/14/17 0058  NA 136 141  K >7.5* 5.0  CL 107 96*  CO2 14* 30  BUN 46* 15  CREATININE 12.28* 6.62*  GLUCOSE 210* 21*    Electrolytes Recent Labs  Lab 10/13/17 1745 10/13/17 1812 10/14/17 0058  CALCIUM 10.4*  --  9.3  MG  --   --  2.0  PHOS  --  8.1*  --     CBC Recent Labs  Lab 10/13/17 1540 10/14/17 0058  WBC 8.4 11.8*  HGB 11.7* 13.3  HCT 38.1* 41.1  PLT 88* 102*    Coag's No results for input(s): APTT, INR in the last 168 hours.  Sepsis Markers No results for input(s): LATICACIDVEN, PROCALCITON, O2SATVEN in the last 168 hours.  ABG No results for input(s): PHART,  PCO2ART, PO2ART in the last 168 hours.  Liver Enzymes Recent Labs  Lab 10/13/17 1745  AST 37  ALT 18  ALKPHOS 61  BILITOT 2.0*  ALBUMIN 3.2*    Cardiac Enzymes Recent Labs  Lab 10/13/17 1745 10/13/17 2159 10/14/17 0058  TROPONINI 0.07* 0.09* 0.13*    Glucose Recent Labs  Lab 10/13/17 1454 10/13/17 1653 10/13/17 2005 10/14/17 0249 10/14/17 0306 10/14/17 0608  GLUCAP 192* 215* 99 36* 174* 90    Imaging Ct Head Wo Contrast  Result Date: 10/13/2017 CLINICAL DATA:  Minor head trauma, altered mental status after fall hitting forehead EXAM: CT HEAD WITHOUT CONTRAST TECHNIQUE: Contiguous axial images were obtained from the base of the skull through the vertex without intravenous contrast. COMPARISON:  CT brain scan of 07/12/2014 FINDINGS: Brain: The ventricular system remains prominent, as are the cortical sulci, indicative of diffuse atrophy. Mild small vessel ischemic changes again noted throughout the periventricular white matter. No hemorrhage, mass lesion, or acute infarction is seen. The fourth ventricle and basilar cisterns are unremarkable. Vascular: No vascular abnormality is seen on this unenhanced study. Skull: On bone window images, no acute calvarial abnormality is seen. Sinuses/Orbits: Sclerotic focus within the left ethmoid air cells may represent osteoma. Nodular areas within the maxillary  sinuses are most consistent with either retention cysts or polyps. Other: None. IMPRESSION: 1. Stable atrophy and mild small vessel ischemic change. No acute intracranial abnormality. 2. Nodular soft tissue lesions within the maxillary sinus may represent retention cysts or polyps. Electronically Signed   By: Ivar Drape M.D.   On: 10/13/2017 16:41   Dg Chest Portable 1 View  Result Date: 10/13/2017 CLINICAL DATA:  Bradycardia EXAM: PORTABLE CHEST 1 VIEW COMPARISON:  01/22/2016 FINDINGS: AP portable semi upright view of the chest. Borderline cardiomegaly is noted with aortic stent  graft in place, unchanged in appearance. Mild vascular congestion is noted without acute pneumonic consolidation. No effusion or pneumothorax. A right upper extremity vascular stent is now noted. The dialysis catheter is no longer present. External cardiac monitoring leads and defibrillator paddles are noted. There is no acute osseous abnormality. IMPRESSION: Mild vascular congestion with borderline cardiomegaly is noted. Electronically Signed   By: Ashley Royalty M.D.   On: 10/13/2017 15:00     STUDIES:  CT head 3/4 > atrophy with small vessel ischemic changes, maxillary sinus retention cyst of polyp  SIGNIFICANT EVENTS: 3/04 Admit, emergent HD for hyperkalemia 3/05 Transfer to tele  ASSESSMENT / PLAN:  Symptomatic bradycardia in setting of recurrent hyperkalemia. - transfer to telemetry  Hyperkalemia with hx of ESRD. - f/u BMET - nephrology following  Hx of familial adenomatous polyposis. - he reports he was to have f/u scope with GI at [facility]  DM type II. - SSI  Acute metabolic encephalopathy >> much improved. Peripheral neuropathy. - monitor mental status - hold outpt neurontin  DVT prophylaxis - SQ heparin SUP - protonix Nutrition - renal, carb modified Goals of care - full code  Transfer to tele 3/05 >> To [facility] 3/06 and PCCM off  D/w Dr. Clayborn Heron, MD [place] 10/14/2017, 8:48 AM Pager:  [phone] After 3pm call: [phone]

## 2017-10-14 NOTE — Procedures (Addendum)
I have personally attended this patient's dialysis session.   HD again today for K 6.7 Of note unable to run pt at BFR 450 due to very high arterial pressure.  Access may be the issue with his potassium Will schedule shuntogram  Jamal Maes, MD [place] Pager 10/14/2017, 4:32 PM

## 2017-10-14 NOTE — Progress Notes (Signed)
Pt to be transferred to [facility] [place] room 15.  Pt currently receiving dialysis in Hemo unit.  Report called to [facility], Therapist, sports.  Will continue to monitor.

## 2017-10-14 NOTE — Progress Notes (Signed)
Dr. Lucile Shutters, PCCM on call talked to patient over the phone and pt was in agreement to stay overnight.

## 2017-10-14 NOTE — Progress Notes (Signed)
Called with Potassium value 6.7, no hemolysis.  Dr Halford Chessman, CCM notified as well as DR Lorrene Reid of [facility] per Dr Halford Chessman.  Pt scheduled for hemodialysis.  Report given to HD nurse.  Will monitor.

## 2017-10-14 NOTE — Progress Notes (Signed)
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 Called rapid response nurse if he knows what the patient is talking about, rapid response nurse advised to call 6NT or any surgery floor. 6NT and 10M called but nobody can figure out what catheter it is. Pt verbalized that it's something he used at home, pt lives alone and nobody can bring it here. Pt expressed going home AMA. PCCM on call notified, awaiting call back.

## 2017-10-14 NOTE — Progress Notes (Addendum)
[name]  Subjective/Interval History:  HD yesterday for ^^K No issues with the tmt Under EDW Post weight 68.6 kg Says has no idea what he is doing wrong (also recent hosp [facility] 2/22 K 8.4 and was by his report taking daily kayexalate Denies use of salt substitute   Objective Vital signs in last 24 hours: Vitals:   10/14/17 0300 10/14/17 0400 10/14/17 0500 10/14/17 0600  BP: (!) 123/52 (!) 175/82 138/67 (!) 152/67  Pulse: (!) 45 (!) 57 (!) 53 (!) 51  Resp: 12 14 12 14   Temp:      TempSrc:      SpO2: 94% 98% 95% 95%  Weight:  69.4 kg (152 lb 16 oz)    Height:       Weight change:   Intake/Output Summary (Last 24 hours) at 10/14/2017 0711 Last data filed at 10/14/2017 0600 Gross per 24 hour  Intake 103 ml  Output 3125 ml  Net -3022 ml   Physical Exam:  Blood pressure (!) 152/67, pulse (!) 51, temperature 98.1 F (36.7 C), temperature source Oral, resp. rate 14, height 5\' 9"  (1.753 m), weight 69.4 kg (152 lb 16 oz), SpO2 95 %.  Pale app WM Abrasion on forehead NAD Much more alert than yesterday Lungs grossly clear Bradycardic S1S2 No S3 Abd colostomy in place No LE edema RUE AVG bruit high pitched lower aspect of AVG Neuro oriented X3    Recent Labs  Lab 10/13/17 1745 10/13/17 1812 10/14/17 0058  NA 136  --  141  K >7.5*  --  5.0  CL 107  --  96*  CO2 14*  --  30  GLUCOSE 210*  --  21*  BUN 46*  --  15  CREATININE 12.28*  --  6.62*  CALCIUM 10.4*  --  9.3  PHOS  --  8.1*  --     Recent Labs  Lab 10/13/17 1745  AST 37  ALT 18  ALKPHOS 61  BILITOT 2.0*  PROT 6.1*  ALBUMIN 3.2*    Recent Labs  Lab 10/13/17 1540 10/14/17 0058  WBC 8.4 11.8*  NEUTROABS 7.6  --   HGB 11.7* 13.3  HCT 38.1* 41.1  MCV 97.2 94.1  PLT 88* 102*    Recent Labs  Lab 10/13/17 1745 10/13/17 2159 10/14/17 0058  TROPONINI 0.07* 0.09* 0.13*   CBG: Recent Labs  Lab 10/13/17 1653 10/13/17 2005 10/14/17 0249 10/14/17 0306 10/14/17 0608  GLUCAP 215* 99  36* 174* 90    Studies/Results: Ct Head Wo Contrast  Result Date: 10/13/2017 CLINICAL DATA:  Minor head trauma, altered mental status after fall hitting forehead EXAM: CT HEAD WITHOUT CONTRAST TECHNIQUE: Contiguous axial images were obtained from the base of the skull through the vertex without intravenous contrast. COMPARISON:  CT brain scan of 07/12/2014 FINDINGS: Brain: The ventricular system remains prominent, as are the cortical sulci, indicative of diffuse atrophy. Mild small vessel ischemic changes again noted throughout the periventricular white matter. No hemorrhage, mass lesion, or acute infarction is seen. The fourth ventricle and basilar cisterns are unremarkable. Vascular: No vascular abnormality is seen on this unenhanced study. Skull: On bone window images, no acute calvarial abnormality is seen. Sinuses/Orbits: Sclerotic focus within the left ethmoid air cells may represent osteoma. Nodular areas within the maxillary sinuses are most consistent with either retention cysts or polyps. Other: None. IMPRESSION: 1. Stable atrophy and mild small vessel ischemic change. No acute intracranial abnormality. 2. Nodular soft tissue lesions within the  maxillary sinus may represent retention cysts or polyps. Electronically Signed   By: Ivar Drape M.D.   On: 10/13/2017 16:41   Dg Chest Portable 1 View  Result Date: 10/13/2017 CLINICAL DATA:  Bradycardia EXAM: PORTABLE CHEST 1 VIEW COMPARISON:  01/22/2016 FINDINGS: AP portable semi upright view of the chest. Borderline cardiomegaly is noted with aortic stent graft in place, unchanged in appearance. Mild vascular congestion is noted without acute pneumonic consolidation. No effusion or pneumothorax. A right upper extremity vascular stent is now noted. The dialysis catheter is no longer present. External cardiac monitoring leads and defibrillator paddles are noted. There is no acute osseous abnormality. IMPRESSION: Mild vascular congestion with borderline  cardiomegaly is noted. Electronically Signed   By: Ashley Royalty M.D.   On: 10/13/2017 15:00   Medications: . sodium chloride    . sodium chloride     . dorzolamide-timolol  1 drop Left Eye BID  . [START ON 10/15/2017] doxercalciferol  2 mcg Intravenous Q M,W,F-HD  . insulin aspart  0-9 Units Subcutaneous TID WC  . latanoprost  1 drop Left Eye QHS  . mouth rinse  15 mL Mouth Rinse BID  . sevelamer carbonate  1,600 mg Oral TID WC  . sodium chloride flush  3 mL Intravenous Q12H   Dialysis prescription MWF [place] (lives in [place]) [facility] 4 hours EDW 70 450/A1.5 2K2.5 Ca Heparin 2000 bolus Hectorol 2 mcg No ESA or iron  Assessment/Recommendations  1. ESRD - MWF HD. K>8.5 w/profound bradycardia. No missed TMT's per pt. Has had admissions at [facility] for same issue.  Had urgent HD yesterday. K around 12 MN 5. Repeat this AM pending 2. Life threatening hyperkalemia- s/p urgent HD. Admissions noted at [facility] for same. Last 2/22.Says was taking kayexalate 30 gm daily. Don't know about outpt HD labs re K.  Repeating K this AM. Perhaps they need to consider use of 1K bath or change to daily Veltassa.  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 Encephalopathy - seems MUCH improved today. Pt lives alone, drives self to dialysis - presumably yesterday's lethargy and inability to recall details of history acute related to current events - CT scan of brain nothing acute; small vessel ds/atrophy 5. H/o colectomy w/colostomy 6. Whipple's procedure 1996 7. DM2  Jamal Maes, MD [facility] [phone] pager 10/14/2017, 7:11 AM   Addendum: K ordered earlier this AM 6.7 no visible hemolysis!!! Will require HD again today Then start QD kayexalate Not clear to me what issue is as Raliegh Ip Marland Kitchen after HD in controlled environment  Jamal Maes, MD [place] Pager 10/14/2017, 12:59 PM

## 2017-10-14 NOTE — Progress Notes (Signed)
Hypoglycemic Event  CBG: 36  Treatment: D50 IV 50 mL  Symptoms: None  Follow-up CBG: Time:0305 CBG Result:174  Possible Reasons for Event: Inadequate meal intake  Comments/MD notified:per protocol    Dillard Essex

## 2017-10-15 ENCOUNTER — Encounter (HOSPITAL_COMMUNITY): Payer: Self-pay | Admitting: General Practice

## 2017-10-15 ENCOUNTER — Inpatient Hospital Stay (HOSPITAL_COMMUNITY): Payer: Medicare Other

## 2017-10-15 LAB — CBC
HCT: 41.6 % (ref 39.0–52.0)
Hemoglobin: 12.8 g/dL — ABNORMAL LOW (ref 13.0–17.0)
MCH: 29.7 pg (ref 26.0–34.0)
MCHC: 30.8 g/dL (ref 30.0–36.0)
MCV: 96.5 fL (ref 78.0–100.0)
Platelets: 76 10*3/uL — ABNORMAL LOW (ref 150–400)
RBC: 4.31 MIL/uL (ref 4.22–5.81)
RDW: 19.3 % — ABNORMAL HIGH (ref 11.5–15.5)
WBC: 6.4 10*3/uL (ref 4.0–10.5)

## 2017-10-15 LAB — RENAL FUNCTION PANEL
Albumin: 3.2 g/dL — ABNORMAL LOW (ref 3.5–5.0)
Anion gap: 10 (ref 5–15)
BUN: 12 mg/dL (ref 6–20)
CHLORIDE: 98 mmol/L — AB (ref 101–111)
CO2: 26 mmol/L (ref 22–32)
CREATININE: 6.15 mg/dL — AB (ref 0.61–1.24)
Calcium: 9 mg/dL (ref 8.9–10.3)
GFR, EST AFRICAN AMERICAN: 9 mL/min — AB (ref >60)
GFR, EST NON AFRICAN AMERICAN: 8 mL/min — AB (ref >60)
Glucose, Bld: 104 mg/dL — ABNORMAL HIGH (ref 65–99)
POTASSIUM: 5.4 mmol/L — AB (ref 3.5–5.1)
Phosphorus: 5.4 mg/dL — ABNORMAL HIGH (ref 2.5–4.6)
Sodium: 134 mmol/L — ABNORMAL LOW (ref 135–145)

## 2017-10-15 LAB — GLUCOSE, CAPILLARY
GLUCOSE-CAPILLARY: 119 mg/dL — AB (ref 65–99)
Glucose-Capillary: 103 mg/dL — ABNORMAL HIGH (ref 65–99)
Glucose-Capillary: 241 mg/dL — ABNORMAL HIGH (ref 65–99)

## 2017-10-15 MED ORDER — DOXERCALCIFEROL 4 MCG/2ML IV SOLN
INTRAVENOUS | Status: AC
  Administered 2017-10-15: 2 ug via INTRAVENOUS
  Filled 2017-10-15: qty 2

## 2017-10-15 MED ORDER — DIPHENHYDRAMINE HCL 25 MG PO CAPS
25.0000 mg | ORAL_CAPSULE | Freq: Four times a day (QID) | ORAL | Status: DC | PRN
  Administered 2017-10-15 – 2017-10-17 (×2): 25 mg via ORAL
  Filled 2017-10-15 (×2): qty 1

## 2017-10-15 NOTE — Progress Notes (Signed)
   10/14/17 1937  Vitals  Temp 98.3 F (36.8 C)  Temp Source Oral  BP 136/65  BP Location Left Arm  BP Method Automatic  Patient Position (if appropriate) Lying  Pulse Rate 64  Pulse Rate Source Dinamap  Resp 16  Oxygen Therapy  SpO2 96 %  O2 Device Room Air  Height and Weight  Height 5\' 9"  (1.753 m)  Weight 69 kg (152 lb 1.9 oz)  Type of Scale Used Bed  BSA (Calculated - sq m) 1.83 sq meters  BMI (Calculated) 22.45  Weight in (lb) to have BMI = 25 168.9  Pt came from dialysis, transferred from 57M to rm 3E15, alert and oriented denied pain at this time.

## 2017-10-15 NOTE — Procedures (Signed)
I have personally attended this patient's dialysis session.   3rd HD in a row and K still 5.4 pre HD Pre weight 68.9 so will have new EDW at D/C AVG running at 400.  Suspect HD clearance issue with K  Jamal Maes, MD [place] Pager 10/15/2017, 10:36 AM

## 2017-10-15 NOTE — Progress Notes (Signed)
[name]  Subjective/Interval History:  Required HD again 3.5 for recurrent ^K of 6.7 (in monitored environment) HD issues with AVG high arterial pressures limited BFR to 350  Considering access problem as part of reason for recurrent ^K  (ie clearance issue) Today is normal HD day Shuntogram ordered to eval AVG  Under EDW Post weight 68.4 so new EDW at discharge  Objective Vital signs in last 24 hours: Vitals:   10/14/17 1600 10/14/17 1830 10/14/17 1937 10/15/17 0457  BP: (!) 128/30 (!) 125/50 136/65 130/63  Pulse: 63 60 64 64  Resp: 11 19 16 18   Temp:  98.3 F (36.8 C) 98.3 F (36.8 C) 97.9 F (36.6 C)  TempSrc:  Oral Oral Oral  SpO2: 93%  96% 97%  Weight:   69 kg (152 lb 1.9 oz) 68.4 kg (150 lb 11.2 oz)  Height:   5\' 9"  (1.753 m)    Weight change: -6.312 kg (-14.6 oz)  Intake/Output Summary (Last 24 hours) at 10/15/2017 1019 Last data filed at 10/15/2017 0900 Gross per 24 hour  Intake 720 ml  Output 1975 ml  Net -1255 ml   Physical Exam:  Blood pressure 130/63, pulse 64, temperature 97.9 F (36.6 C), temperature source Oral, resp. rate 18, height 5\' 9"  (1.753 m), weight 68.4 kg (150 lb 11.2 oz), SpO2 97 %.   Pale app WM Abrasion on forehead clearing NAD Much more alert than yesterday Lungs grossly clear Bradycardic S1S2 No S3 Abd colostomy in place No LE edema RUE AVG bruit high pitched lower aspect of AVG Neuro oriented X3    Recent Labs  Lab 10/13/17 1745 10/13/17 1812 10/14/17 0058 10/14/17 1124 10/15/17 0553  NA 136  --  141 137 134*  K >7.5*  --  5.0 6.7* 5.4*  CL 107  --  96* 94* 98*  CO2 14*  --  30 31 26   GLUCOSE 210*  --  21* 113* 104*  BUN 46*  --  15 20 12   CREATININE 12.28*  --  6.62* 7.61* 6.15*  CALCIUM 10.4*  --  9.3 8.9 9.0  PHOS  --  8.1*  --  6.2* 5.4*    Recent Labs  Lab 10/13/17 1745 10/14/17 1124 10/15/17 0553  AST 37  --   --   ALT 18  --   --   ALKPHOS 61  --   --   BILITOT 2.0*  --   --   PROT 6.1*  --   --    ALBUMIN 3.2* 3.1* 3.2*    Recent Labs  Lab 10/13/17 1540 10/14/17 0058 10/15/17 0553  WBC 8.4 11.8* 6.4  NEUTROABS 7.6  --   --   HGB 11.7* 13.3 12.8*  HCT 38.1* 41.1 41.6  MCV 97.2 94.1 96.5  PLT 88* 102* 76*    Recent Labs  Lab 10/13/17 1745 10/13/17 2159 10/14/17 0058 10/14/17 1124  TROPONINI 0.07* 0.09* 0.13* 0.12*   CBG: Recent Labs  Lab 10/14/17 0608 10/14/17 0815 10/14/17 1146 10/14/17 2131 10/15/17 0820  GLUCAP 90 66 112* 142* 103*    Studies/Results: Ct Head Wo Contrast  Result Date: 10/13/2017 CLINICAL DATA:  Minor head trauma, altered mental status after fall hitting forehead EXAM: CT HEAD WITHOUT CONTRAST TECHNIQUE: Contiguous axial images were obtained from the base of the skull through the vertex without intravenous contrast. COMPARISON:  CT brain scan of 07/12/2014 FINDINGS: Brain: The ventricular system remains prominent, as are the cortical sulci, indicative of diffuse atrophy.  Mild small vessel ischemic changes again noted throughout the periventricular white matter. No hemorrhage, mass lesion, or acute infarction is seen. The fourth ventricle and basilar cisterns are unremarkable. Vascular: No vascular abnormality is seen on this unenhanced study. Skull: On bone window images, no acute calvarial abnormality is seen. Sinuses/Orbits: Sclerotic focus within the left ethmoid air cells may represent osteoma. Nodular areas within the maxillary sinuses are most consistent with either retention cysts or polyps. Other: None. IMPRESSION: 1. Stable atrophy and mild small vessel ischemic change. No acute intracranial abnormality. 2. Nodular soft tissue lesions within the maxillary sinus may represent retention cysts or polyps. Electronically Signed   By: Ivar Drape M.D.   On: 10/13/2017 16:41   Dg Chest Portable 1 View  Result Date: 10/13/2017 CLINICAL DATA:  Bradycardia EXAM: PORTABLE CHEST 1 VIEW COMPARISON:  01/22/2016 FINDINGS: AP portable semi upright view of  the chest. Borderline cardiomegaly is noted with aortic stent graft in place, unchanged in appearance. Mild vascular congestion is noted without acute pneumonic consolidation. No effusion or pneumothorax. A right upper extremity vascular stent is now noted. The dialysis catheter is no longer present. External cardiac monitoring leads and defibrillator paddles are noted. There is no acute osseous abnormality. IMPRESSION: Mild vascular congestion with borderline cardiomegaly is noted. Electronically Signed   By: Ashley Royalty M.D.   On: 10/13/2017 15:00   Medications: . sodium chloride    . sodium chloride     . diphenhydrAMINE  25 mg Oral Once  . dorzolamide-timolol  1 drop Left Eye BID  . doxercalciferol  2 mcg Intravenous Q M,W,F-HD  . heparin injection (subcutaneous)  5,000 Units Subcutaneous Q8H  . insulin aspart  0-9 Units Subcutaneous TID WC  . latanoprost  1 drop Left Eye QHS  . mouth rinse  15 mL Mouth Rinse BID  . pantoprazole  40 mg Oral Daily  . sevelamer carbonate  1,600 mg Oral TID WC  . sodium chloride flush  3 mL Intravenous Q12H   Dialysis prescription MWF [place] (lives in [place]) [place] 4 hours EDW 70 will have lower EDW at discharge 450/A1.5 2K2.5 Ca Heparin 2000 bolus Hectorol 2 mcg No ESA or iron  Assessment/Recommendations  1. ESRD - MWF HD. K>8.5 w/profound bradycardia. Admissions at [facility] for same issue.  Had not missed prior to current admit, though that has been issue in the past. Had urgent HD 3/4 and required again on 3/5 for recurrent K up to 6.7. Access pressures quite high so wonder if poor clearance part of the issue. For shuntogram today and today also his regular HD day. K again up at 5.4 today. Will have lower EDW at discharge 2. Life threatening hyperkalemia- s/p urgent HD. Admissions noted at [facility] for same. Last 2/22.Says was taking kayexalate 30 gm daily. Recurrent here in the hospital. Evaluating access as above. Consider use of 1K bath  + Veltassa as outpt. Have resumed daily kayexalate for now. 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 Encephalopathy - resolved. Pt lives alone, drives self to dialysis - presumably admission lethargy and inability to recall details of history acute related to current events - CT scan of brain nothing acute; small vessel ds/atrophy 5. H/o colectomy w/colostomy 6. Whipple's procedure 1996 7. DM2  Jamal Maes, MD [facility] [phone] pager 10/15/2017, 10:19 AM

## 2017-10-15 NOTE — Progress Notes (Signed)
Patient ID: Travis Carlson, male   DOB: Jul 31, 1946, 72 y.o.   MRN: [id number]  PROGRESS NOTE    Travis Carlson  NTI:[id number] DOB: [birth year] DOA: 10/13/2017 PCP: Parke Poisson, MD   Outpatient Specialists: [place] [facility]   Brief Narrative: 72 yo male former smoker with hx of ESRD followed at [facility] and [facility] has several admissions for hyperkalemia.  He presented to [facility] after fall, and found to have hyperkalemia with bradycardia causing altered mental status.  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 Patient was under the care of pulmonology in the ICU but currently on the floor and doing much better. He still has hyperkalemia today but the heart rate is much better.    Assessment & Plan:   Active Problems:   Bradycardia   #1 symptom to bradycardia: This appears to be resolved. Still on monitor. He is being dialyzed today. Continue monitoring.  #2 end-stage renal disease: Again patient has dialysis Mondays Wednesday some Fridays. We'll continue with dialysis at this point.  #3 coronary artery disease: Appears to be at baseline. Continue supportive care  #4 diabetes: Blood sugar appears well controlled. No change in medications. Continue sliding scale insulin  #5 metabolic encephalopathy: Much better and resolving. Continue close monitoring.   DVT prophylaxis: Heparin Code Status: Full Family Communication: None around  Disposition Plan: Home  Consultants:   Dr Lorrene Reid, Nephrology  Procedures: None  Antimicrobials: None  Subjective: Patient is doing much better now awaiting dialysis. Asymptomatic now   Objective: Vitals:   10/14/17 1600 10/14/17 1830 10/14/17 1937 10/15/17 0457  BP: (!) 128/30 (!) 125/50 136/65 130/63  Pulse: 63 60 64 64  Resp: 11 19 16 18   Temp:  98.3 F (36.8 C) 98.3 F (36.8 C) 97.9 F (36.6 C)  TempSrc:  Oral Oral Oral  SpO2: 93%  96% 97%  Weight:    69 kg (152 lb 1.9 oz) 68.4 kg (150 lb 11.2 oz)  Height:   5\' 9"  (1.753 m)     Intake/Output Summary (Last 24 hours) at 10/15/2017 0850 Last data filed at 10/15/2017 0700 Gross per 24 hour  Intake 1120 ml  Output 2175 ml  Net -1055 ml   Filed Weights   10/14/17 1425 10/14/17 1937 10/15/17 0457  Weight: 70.8 kg (156 lb 1.4 oz) 69 kg (152 lb 1.9 oz) 68.4 kg (150 lb 11.2 oz)    Examination:  General exam: Appears calm and comfortable  Respiratory system: Clear to auscultation. Respiratory effort normal. Cardiovascular system: S1 & S2 heard, RRR. No JVD, murmurs, rubs, gallops or clicks. No pedal edema. Gastrointestinal system: Abdomen is nondistended, soft and nontender. No organomegaly or masses felt. Normal bowel sounds heard. Central nervous system: Alert and oriented. No focal neurological deficits. Extremities: Symmetric 5 x 5 power. Skin: No rashes, lesions or ulcers Psychiatry: Judgement and insight appear normal. Mood & affect appropriate.     Data Reviewed: I have personally reviewed following labs and imaging studies  CBC: Recent Labs  Lab 10/13/17 1540 10/14/17 0058 10/15/17 0553  WBC 8.4 11.8* 6.4  NEUTROABS 7.6  --   --   HGB 11.7* 13.3 12.8*  HCT 38.1* 41.1 41.6  MCV 97.2 94.1 96.5  PLT 88* 102* 76*   Basic Metabolic Panel: Recent Labs  Lab 10/13/17 1745 10/13/17 1812 10/14/17 0058 10/14/17 1124 10/15/17 0553  NA 136  --  141 137 134*  K >7.5*  --  5.0 6.7* 5.4*  CL 107  --  96* 94* 98*  CO2 14*  --  30 31 26   GLUCOSE 210*  --  21* 113* 104*  BUN 46*  --  15 20 12   CREATININE 12.28*  --  6.62* 7.61* 6.15*  CALCIUM 10.4*  --  9.3 8.9 9.0  MG  --   --  2.0  --   --   PHOS  --  8.1*  --  6.2* 5.4*   GFR: Estimated Creatinine Clearance: 10.5 mL/min (A) (by C-G formula based on SCr of 6.15 mg/dL (H)). Liver Function Tests: Recent Labs  Lab 10/13/17 1745 10/14/17 1124 10/15/17 0553  AST 37  --   --   ALT 18  --   --   ALKPHOS 61  --   --     BILITOT 2.0*  --   --   PROT 6.1*  --   --   ALBUMIN 3.2* 3.1* 3.2*   No results for input(s): LIPASE, AMYLASE in the last 168 hours. No results for input(s): AMMONIA in the last 168 hours. Coagulation Profile: No results for input(s): INR, PROTIME in the last 168 hours. Cardiac Enzymes: Recent Labs  Lab 10/13/17 1745 10/13/17 2159 10/14/17 0058 10/14/17 1124  TROPONINI 0.07* 0.09* 0.13* 0.12*   BNP (last 3 results) No results for input(s): PROBNP in the last 8760 hours. HbA1C: No results for input(s): HGBA1C in the last 72 hours. CBG: Recent Labs  Lab 10/14/17 0608 10/14/17 0815 10/14/17 1146 10/14/17 2131 10/15/17 0820  GLUCAP 90 66 112* 142* 103*   Lipid Profile: No results for input(s): CHOL, HDL, LDLCALC, TRIG, CHOLHDL, LDLDIRECT in the last 72 hours. Thyroid Function Tests: No results for input(s): TSH, T4TOTAL, FREET4, T3FREE, THYROIDAB in the last 72 hours. Anemia Panel: No results for input(s): VITAMINB12, FOLATE, FERRITIN, TIBC, IRON, RETICCTPCT in the last 72 hours. Urine analysis:    Component Value Date/Time   COLORURINE YELLOW 01/22/2016 1636   APPEARANCEUR TURBID (A) 01/22/2016 1636   LABSPEC 1.015 01/22/2016 1636   PHURINE 7.5 01/22/2016 1636   GLUCOSEU NEGATIVE 01/22/2016 1636   HGBUR SMALL (A) 01/22/2016 1636   BILIRUBINUR NEGATIVE 01/22/2016 1636   KETONESUR NEGATIVE 01/22/2016 1636   PROTEINUR 100 (A) 01/22/2016 1636   UROBILINOGEN 0.2 07/12/2014 0726   NITRITE NEGATIVE 01/22/2016 1636   LEUKOCYTESUR LARGE (A) 01/22/2016 1636   Sepsis Labs: @LABRCNTIP (procalcitonin:4,lacticidven:4)  ) Recent Results (from the past 240 hour(s))  MRSA PCR Screening     Status: None   Collection Time: 10/13/17  5:03 PM  Result Value Ref Range Status   MRSA by PCR NEGATIVE NEGATIVE Final    Comment:        The GeneXpert MRSA Assay (FDA approved for NASAL specimens only), is one component of a comprehensive MRSA colonization surveillance program. It  is not intended to diagnose MRSA infection nor to guide or monitor treatment for MRSA infections. Performed at [facility], [place] [address], [place], [place] [address]          Radiology Studies: Ct Head Wo Contrast  Result Date: 10/13/2017 CLINICAL DATA:  Minor head trauma, altered mental status after fall hitting forehead EXAM: CT HEAD WITHOUT CONTRAST TECHNIQUE: Contiguous axial images were obtained from the base of the skull through the vertex without intravenous contrast. COMPARISON:  CT brain scan of 07/12/2014 FINDINGS: Brain: The ventricular system remains prominent, as are the cortical sulci, indicative of diffuse atrophy. Mild small vessel ischemic changes  again noted throughout the periventricular white matter. No hemorrhage, mass lesion, or acute infarction is seen. The fourth ventricle and basilar cisterns are unremarkable. Vascular: No vascular abnormality is seen on this unenhanced study. Skull: On bone window images, no acute calvarial abnormality is seen. Sinuses/Orbits: Sclerotic focus within the left ethmoid air cells may represent osteoma. Nodular areas within the maxillary sinuses are most consistent with either retention cysts or polyps. Other: None. IMPRESSION: 1. Stable atrophy and mild small vessel ischemic change. No acute intracranial abnormality. 2. Nodular soft tissue lesions within the maxillary sinus may represent retention cysts or polyps. Electronically Signed   By: Ivar Drape M.D.   On: 10/13/2017 16:41   Dg Chest Portable 1 View  Result Date: 10/13/2017 CLINICAL DATA:  Bradycardia EXAM: PORTABLE CHEST 1 VIEW COMPARISON:  01/22/2016 FINDINGS: AP portable semi upright view of the chest. Borderline cardiomegaly is noted with aortic stent graft in place, unchanged in appearance. Mild vascular congestion is noted without acute pneumonic consolidation. No effusion or pneumothorax. A right upper extremity vascular stent is now noted. The dialysis catheter is no  longer present. External cardiac monitoring leads and defibrillator paddles are noted. There is no acute osseous abnormality. IMPRESSION: Mild vascular congestion with borderline cardiomegaly is noted. Electronically Signed   By: Ashley Royalty M.D.   On: 10/13/2017 15:00        Scheduled Meds: . diphenhydrAMINE  25 mg Oral Once  . dorzolamide-timolol  1 drop Left Eye BID  . doxercalciferol  2 mcg Intravenous Q M,W,F-HD  . heparin injection (subcutaneous)  5,000 Units Subcutaneous Q8H  . insulin aspart  0-9 Units Subcutaneous TID WC  . latanoprost  1 drop Left Eye QHS  . mouth rinse  15 mL Mouth Rinse BID  . pantoprazole  40 mg Oral Daily  . sevelamer carbonate  1,600 mg Oral TID WC  . sodium chloride flush  3 mL Intravenous Q12H   Continuous Infusions: . sodium chloride    . sodium chloride       LOS: 2 days    Time spent: 33 minutes    Frona Yost,LAWAL, MD [facility] Pager [phone] If 7PM-7AM, please contact night-coverage [email or web address] Password [email or web address] 10/15/2017, 8:50 AM

## 2017-10-15 NOTE — Progress Notes (Signed)
MD paged for benadryl order for itching. Will continue to monitor.

## 2017-10-15 NOTE — Progress Notes (Signed)
Pt looking for his eyeglasses this morning, pt verbalized that he left it from his previous room which is 76[facility] 08. Called [facility] and was told that they are going to look for it and will return call. Pt was updated.

## 2017-10-16 ENCOUNTER — Inpatient Hospital Stay (HOSPITAL_COMMUNITY): Payer: Medicare Other

## 2017-10-16 DIAGNOSIS — R404 Transient alteration of awareness: Secondary | ICD-10-CM

## 2017-10-16 HISTORY — PX: IR AV DIALY SHUNT INTRO NEEDLE/INTRACATH INITIAL W/PTA/IMG RIGHT: IMG6116

## 2017-10-16 LAB — CBC
HCT: 49.8 % (ref 39.0–52.0)
Hemoglobin: 16.5 g/dL (ref 13.0–17.0)
MCH: 31.2 pg (ref 26.0–34.0)
MCHC: 33.1 g/dL (ref 30.0–36.0)
MCV: 94.1 fL (ref 78.0–100.0)
PLATELETS: 113 10*3/uL — AB (ref 150–400)
RBC: 5.29 MIL/uL (ref 4.22–5.81)
RDW: 18.5 % — ABNORMAL HIGH (ref 11.5–15.5)
WBC: 14.1 10*3/uL — AB (ref 4.0–10.5)

## 2017-10-16 LAB — COMPREHENSIVE METABOLIC PANEL
ALBUMIN: 4.1 g/dL (ref 3.5–5.0)
ALT: 15 U/L — ABNORMAL LOW (ref 17–63)
AST: 24 U/L (ref 15–41)
Alkaline Phosphatase: 76 U/L (ref 38–126)
Anion gap: 16 — ABNORMAL HIGH (ref 5–15)
BUN: 26 mg/dL — ABNORMAL HIGH (ref 6–20)
CHLORIDE: 90 mmol/L — AB (ref 101–111)
CO2: 20 mmol/L — AB (ref 22–32)
Calcium: 10 mg/dL (ref 8.9–10.3)
Creatinine, Ser: 7.97 mg/dL — ABNORMAL HIGH (ref 0.61–1.24)
GFR calc non Af Amer: 6 mL/min — ABNORMAL LOW (ref >60)
GFR, EST AFRICAN AMERICAN: 7 mL/min — AB (ref >60)
GLUCOSE: 193 mg/dL — AB (ref 65–99)
Potassium: >7.5 mmol/L (ref 3.5–5.1)
SODIUM: 126 mmol/L — AB (ref 135–145)
Total Bilirubin: 3.3 mg/dL — ABNORMAL HIGH (ref 0.3–1.2)
Total Protein: 7.6 g/dL (ref 6.5–8.1)

## 2017-10-16 LAB — PROTIME-INR
INR: 1.66
PROTHROMBIN TIME: 19.5 s — AB (ref 11.4–15.2)

## 2017-10-16 LAB — BLOOD GAS, ARTERIAL
ACID-BASE DEFICIT: 1 mmol/L (ref 0.0–2.0)
Bicarbonate: 22.3 mmol/L (ref 20.0–28.0)
Drawn by: 252031
O2 SAT: 95 %
PATIENT TEMPERATURE: 98.6
PCO2 ART: 31.3 mmHg — AB (ref 32.0–48.0)
PH ART: 7.466 — AB (ref 7.350–7.450)
pO2, Arterial: 73.8 mmHg — ABNORMAL LOW (ref 83.0–108.0)

## 2017-10-16 LAB — GLUCOSE, CAPILLARY
GLUCOSE-CAPILLARY: 150 mg/dL — AB (ref 65–99)
GLUCOSE-CAPILLARY: 149 mg/dL — AB (ref 65–99)
GLUCOSE-CAPILLARY: 142 mg/dL — AB (ref 65–99)
Glucose-Capillary: 188 mg/dL — ABNORMAL HIGH (ref 65–99)
Glucose-Capillary: 280 mg/dL — ABNORMAL HIGH (ref 65–99)

## 2017-10-16 LAB — APTT: aPTT: 37 seconds — ABNORMAL HIGH (ref 24–36)

## 2017-10-16 LAB — AMMONIA: AMMONIA: 37 umol/L — AB (ref 9–35)

## 2017-10-16 MED ORDER — SODIUM POLYSTYRENE SULFONATE PO POWD
15.0000 g | Freq: Every day | ORAL | Status: DC
  Administered 2017-10-16 – 2017-10-18 (×3): 15 g via ORAL
  Filled 2017-10-16 (×3): qty 15

## 2017-10-16 MED ORDER — INSULIN ASPART 100 UNIT/ML ~~LOC~~ SOLN
10.0000 [IU] | Freq: Once | SUBCUTANEOUS | Status: AC
  Administered 2017-10-16: 10 [IU] via INTRAVENOUS

## 2017-10-16 MED ORDER — MIDAZOLAM HCL 2 MG/2ML IJ SOLN
INTRAMUSCULAR | Status: AC | PRN
  Administered 2017-10-16: 0.5 mg via INTRAVENOUS

## 2017-10-16 MED ORDER — IOPAMIDOL (ISOVUE-300) INJECTION 61%
INTRAVENOUS | Status: AC
  Administered 2017-10-16: 80 mL
  Filled 2017-10-16: qty 100

## 2017-10-16 MED ORDER — FENTANYL CITRATE (PF) 100 MCG/2ML IJ SOLN
INTRAMUSCULAR | Status: AC | PRN
  Administered 2017-10-16 (×2): 25 ug via INTRAVENOUS

## 2017-10-16 MED ORDER — LIDOCAINE HCL (PF) 1 % IJ SOLN
INTRAMUSCULAR | Status: AC | PRN
  Administered 2017-10-16: 10 mL

## 2017-10-16 MED ORDER — IOPAMIDOL (ISOVUE-300) INJECTION 61%
INTRAVENOUS | Status: AC
  Filled 2017-10-16: qty 100

## 2017-10-16 MED ORDER — SODIUM POLYSTYRENE SULFONATE PO POWD
30.0000 g | Freq: Once | ORAL | Status: AC
  Administered 2017-10-16: 30 g via ORAL
  Filled 2017-10-16: qty 30

## 2017-10-16 MED ORDER — INSULIN ASPART 100 UNIT/ML ~~LOC~~ SOLN: 10.0000 [IU] | Freq: Once | SUBCUTANEOUS | Status: DC

## 2017-10-16 MED ORDER — MIDAZOLAM HCL 2 MG/2ML IJ SOLN
INTRAMUSCULAR | Status: AC
  Filled 2017-10-16: qty 2

## 2017-10-16 MED ORDER — DEXTROSE 50 % IV SOLN
1.0000 | Freq: Once | INTRAVENOUS | Status: AC
  Administered 2017-10-16: 50 mL via INTRAVENOUS
  Filled 2017-10-16: qty 50

## 2017-10-16 MED ORDER — FENTANYL CITRATE (PF) 100 MCG/2ML IJ SOLN
INTRAMUSCULAR | Status: AC
  Filled 2017-10-16: qty 2

## 2017-10-16 MED ORDER — SODIUM CHLORIDE 0.9 % IV SOLN
1.0000 g | Freq: Once | INTRAVENOUS | Status: AC
  Administered 2017-10-16: 1 g via INTRAVENOUS
  Filled 2017-10-16: qty 10

## 2017-10-16 MED ORDER — LIDOCAINE HCL 1 % IJ SOLN
INTRAMUSCULAR | Status: AC
  Filled 2017-10-16: qty 20

## 2017-10-16 NOTE — Procedures (Signed)
  Pre-operative Diagnosis:  Abnormal pressures during dialysis      Post-operative Diagnosis: Multlfocal stenosis.  Treated with balloon angioplasty.  Indications: Abnormal pressures  Procedure:  Shuntogram and graft/venous angioplasty  Findings:  High grade stenosis in graft and moderate intra stent stenosis.  Graft stenosis resolved after 7 mm balloon angioplasty and improved flow in stent after 8 mm angioplasty.  Residual narrowing in stent probably related to a flair stent within another stent.  No significant stenosis at end of procedure.   Complications: None     EBL: Minimal  Plan: Graft is ready to use.  Remove suture at next dialysis.

## 2017-10-16 NOTE — Evaluation (Signed)
Physical Therapy Evaluation Patient Details Name: Travis Carlson MRN: [id number] DOB: [birth year] Today's Date: 10/16/2017   History of Present Illness  72 yo male former smoker with hx of ESRD followed at [facility] has several admissions for hyperkalemia.  He presented to [facility] after fall, and found to have hyperkalemia with bradycardia causing altered mental status.  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   Clinical Impression  Patient presents with decreased independence and safety with mobility and questionable cognition considering he lived alone PTA.  Currently pt able to ambulate short distances with min A due to decreased balance, decreased activity tolerance with abdominal pain, multiple recent admissions/ED visits due to hyperkalemia.  Patient not interested currently in considering post acute inpatient rehab, but feel he is high risk for falls and not managing his ostomy currently.  Will continue skilled PT during the acute stay to maximize mobility and safety.     Follow Up Recommendations CIR    Equipment Recommendations  Rolling walker with 5" wheels    Recommendations for Other Services Rehab consult     Precautions / Restrictions Precautions Precautions: Fall Precaution Comments: ostomy      Mobility  Bed Mobility Overal bed mobility: Needs Assistance Bed Mobility: Supine to Sit;Sit to Supine     Supine to sit: Supervision;HOB elevated Sit to supine: Supervision   General bed mobility comments: increased time, assist for safety  Transfers Overall transfer level: Needs assistance Equipment used: None Transfers: Sit to/from Stand Sit to Stand: Min guard         General transfer comment: increased time, slow and unsure, requesting to stand for a moment  Ambulation/Gait Ambulation/Gait assistance: Min assist Ambulation Distance (Feet): 22 Feet Assistive device:  None Gait Pattern/deviations: Step-through pattern;Decreased stride length;Shuffle     General Gait Details: unsteady and LOB without assist, limited due to abdominal pain  Stairs            Wheelchair Mobility    Modified Rankin (Stroke Patients Only)       Balance Overall balance assessment: Needs assistance   Sitting balance-Leahy Scale: Good     Standing balance support: No upper extremity supported Standing balance-Leahy Scale: Fair Standing balance comment: standing at bedside no UE support with S, but ambulation with assist for safety due to decreased balance/general weakness                             Pertinent Vitals/Pain Pain Assessment: Faces Faces Pain Scale: Hurts little more Pain Location: abdominal cramping Pain Descriptors / Indicators: Cramping Pain Intervention(s): Monitored during session;Limited activity within patient's tolerance    Home Living Family/patient expects to be discharged to:: Private residence Living Arrangements: Alone   Type of Home: House Home Access: Stairs to enter   Technical brewer of Steps: 1 Home Layout: One level Home Equipment: Marine scientist - single point      Prior Function Level of Independence: Independent         Comments: reports drives to dialysis     Hand Dominance        Extremity/Trunk Assessment   Upper Extremity Assessment Upper Extremity Assessment: Generalized weakness    Lower Extremity Assessment Lower Extremity Assessment: Generalized weakness       Communication   Communication: No difficulties  Cognition Arousal/Alertness: Awake/alert Behavior During Therapy: Flat affect Overall Cognitive Status: No family/caregiver  present to determine baseline cognitive functioning                                        General Comments      Exercises     Assessment/Plan    PT Assessment Patient needs continued PT services  PT Problem List  Decreased strength;Decreased mobility;Decreased safety awareness;Decreased activity tolerance;Decreased balance;Decreased knowledge of use of DME;Decreased cognition       PT Treatment Interventions DME instruction;Therapeutic activities;Gait training;Therapeutic exercise;Patient/family education;Stair training;Balance training;Functional mobility training    PT Goals (Current goals can be found in the Care Plan section)  Acute Rehab PT Goals Patient Stated Goal: To rest PT Goal Formulation: With patient Time For Goal Achievement: 10/30/17 Potential to Achieve Goals: Good    Frequency Min 3X/week   Barriers to discharge Decreased caregiver support      Co-evaluation               AM-PAC PT "6 Clicks" Daily Activity  Outcome Measure Difficulty turning over in bed (including adjusting bedclothes, sheets and blankets)?: A Little Difficulty moving from lying on back to sitting on the side of the bed? : A Lot Difficulty sitting down on and standing up from a chair with arms (e.g., wheelchair, bedside commode, etc,.)?: A Lot Help needed moving to and from a bed to chair (including a wheelchair)?: A Little Help needed walking in hospital room?: A Little Help needed climbing 3-5 steps with a railing? : A Lot 6 Click Score: 15    End of Session Equipment Utilized During Treatment: Gait belt   Patient left: in bed;with call bell/phone within reach   PT Visit Diagnosis: Unsteadiness on feet (R26.81);Other abnormalities of gait and mobility (R26.89);Muscle weakness (generalized) (M62.81)    Time: 0932-6712 PT Time Calculation (min) (ACUTE ONLY): 30 min   Charges:   PT Evaluation $PT Eval Moderate Complexity: 1 Mod PT Treatments $Gait Training: 8-22 mins   PT G CodesMagda Kiel, [place] [phone] 10/16/2017   Reginia Naas 10/16/2017, 4:22 PM

## 2017-10-16 NOTE — Progress Notes (Signed)
During shift change, at 19:20 pm on my arrival rapid response nurse and primary day shift nurse at bedside. Patient was taken down to CT immediately, after patient is returned order placed for patient to be transferred to 3W02. Family, daughter [name] was informed about patient being transferred to another unit. Patient transferred with personal belongings.  Juanmiguel Defelice, RN

## 2017-10-16 NOTE — Progress Notes (Signed)
6PM pt seen awake, normal, at baseline,  6:50 when back in to check on pt, RN notice change in LOC, vitals were stable, called rapid response and they order to call code stroke, Neuro MD and RR came to assess transfer pt down for CT scan, hospitalist notified about pt condition.

## 2017-10-16 NOTE — Code Documentation (Signed)
Responded to Code Stroke called at [place].  Per RN, Travis Carlson with increased lethargy, R sided facial droop, and R sided weakness. On assessment, Travis Carlson very drowsy, confused, slight R facial droop noted, and weak in all extremities.  LKW-1700, CBG-188, NIH-12.  ABG-7.46/31.3/73.8/22.3.  Travis Carlson taken to CT which was negative for acute changes.  Travis Carlson placed on 2L [place] d/t PO2-74.  Due to non-focal exam, TPA not given. IR not needed d/t no LVO symptoms. Additional labs ordered and are pending. PCXR-negative for acute changes.  Travis Carlson transferred to 3W02 for frequent neuro exams.  Report given to [facility], RN.  3W RN will notifiy primary team of any abnormal lab values.

## 2017-10-16 NOTE — Progress Notes (Addendum)
[name]  Subjective/Interval History:  Had HD 3 days in a row for high K Suspicious of access problem/inad clearance Shuntogram delayed, to be done today by IR No labs yet today either Feeling tired, not wanting to talk much Smells like ostomy has leaked  Objective Vital signs in last 24 hours: Vitals:   10/15/17 1509 10/15/17 2029 10/16/17 0445 10/16/17 0830  BP: 134/71 137/75 129/68 (!) 152/61  Pulse: 73 62 80 60  Resp: 18 18 20    Temp: 97.9 F (36.6 C) 98.4 F (36.9 C) 99.3 F (37.4 C)   TempSrc: Oral Oral Oral   SpO2: 96% (!) 65% 95% 98%  Weight:   65.9 kg (145 lb 3.2 oz)   Height:       Weight change: -1.9 kg (-3 oz)  Intake/Output Summary (Last 24 hours) at 10/16/2017 1011 Last data filed at 10/16/2017 0100 Gross per 24 hour  Intake 720 ml  Output 2200 ml  Net -1480 ml   Physical Exam:  Blood pressure (!) 152/61, pulse 60, temperature 99.3 F (37.4 C), temperature source Oral, resp. rate 20, height 5\' 9"  (1.753 m), weight 65.9 kg (145 lb 3.2 oz), SpO2 98 %.   Nice WM,lying in bed, eyes closed Abrasion on forehead clearing up NAD Lungs grossly clear S1S2 No S3 Abd colostomy in place No abd tenderness No LE edema RUE AVG bruit high pitched lower aspect of AVG Neuro oriented X3    Recent Labs  Lab 10/13/17 1745 10/13/17 1812 10/14/17 0058 10/14/17 1124 10/15/17 0553  NA 136  --  141 137 134*  K >7.5*  --  5.0 6.7* 5.4*  CL 107  --  96* 94* 98*  CO2 14*  --  30 31 26   GLUCOSE 210*  --  21* 113* 104*  BUN 46*  --  15 20 12   CREATININE 12.28*  --  6.62* 7.61* 6.15*  CALCIUM 10.4*  --  9.3 8.9 9.0  PHOS  --  8.1*  --  6.2* 5.4*    Recent Labs  Lab 10/13/17 1745 10/14/17 1124 10/15/17 0553  AST 37  --   --   ALT 18  --   --   ALKPHOS 61  --   --   BILITOT 2.0*  --   --   PROT 6.1*  --   --   ALBUMIN 3.2* 3.1* 3.2*    Recent Labs  Lab 10/13/17 1540 10/14/17 0058 10/15/17 0553  WBC 8.4 11.8* 6.4  NEUTROABS 7.6  --   --   HGB  11.7* 13.3 12.8*  HCT 38.1* 41.1 41.6  MCV 97.2 94.1 96.5  PLT 88* 102* 76*    Recent Labs  Lab 10/13/17 1745 10/13/17 2159 10/14/17 0058 10/14/17 1124  TROPONINI 0.07* 0.09* 0.13* 0.12*   CBG: Recent Labs  Lab 10/14/17 2131 10/15/17 0820 10/15/17 1845 10/15/17 2151 10/16/17 0739  GLUCAP 142* 103* 241* 119* 150*   Medications: . sodium chloride    . sodium chloride     . diphenhydrAMINE  25 mg Oral Once  . dorzolamide-timolol  1 drop Left Eye BID  . doxercalciferol  2 mcg Intravenous Q M,W,F-HD  . heparin injection (subcutaneous)  5,000 Units Subcutaneous Q8H  . insulin aspart  0-9 Units Subcutaneous TID WC  . latanoprost  1 drop Left Eye QHS  . mouth rinse  15 mL Mouth Rinse BID  . pantoprazole  40 mg Oral Daily  . sevelamer carbonate  1,600 mg Oral  TID WC  . sodium chloride flush  3 mL Intravenous Q12H   Dialysis prescription MWF [place] (lives in [place]) [facility] 4 hours EDW 70 will have lower EDW at discharge 66 kg 450/A1.5 2K2.5 Ca Heparin 2000 bolus Hectorol 2 mcg No ESA or iron  Assessment/Recommendations  1. ESRD - MWF HD. K>8.5 w/profound bradycardia.  Had urgent HD 3/4 and required again on 3/5 for recurrent K up to 6.7, and on Wed regular day K again 5.4. Access pressures quite high last 2 HD's so suspect poor clearance part of the issue. Shuntogram delayed, rescheduled for today in IR. Will have lower EDW at discharge. Next HD tomorrow if still here. 2. Life threatening hyperkalemia- s/p urgent HD. Admissions noted at [facility] for same. Last 2/22.Says was taking kayexalate 30 gm daily. Recurrent here in the hospital. Evaluating access as above. Consider use of 1K bath + [place] as outpt. Have resumed daily kayexalate 15 gm for now. 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 Encephalopathy - resolved. Pt lives alone, drives self to dialysis - presumably admission lethargy and inability to recall details of history acute related to current events  - CT scan of brain nothing acute; small vessel ds/atrophy 5. H/o colectomy w/colostomy 6. Whipple's procedure 1996 7. DM2  Jamal Maes, MD [facility] [phone] pager 10/16/2017, 10:11 AM

## 2017-10-16 NOTE — Progress Notes (Signed)
Chief Complaint: Patient was seen in consultation today for  at the request of Dr. Lorrene Reid  Referring Physician(s): Dr. Lorrene Reid  Supervising Physician: Markus Daft  Patient Status: [facility] - In-pt  History of Present Illness: Travis Carlson is a 72 y.o. male with ESRD. He has (R)UE AVG, and though they have using it, there has been some access pressure issues. IR is asked to do shuntogram and intervention if necessary. Chart, meds, labs, imaging, allergies reviewed. Has been NPO today in case sedation required.  Past Medical History:  Diagnosis Date  . Arthritis   . 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 Cancer ([place])    "low grade"  . CKD (chronic kidney disease)   . DM2 (diabetes mellitus, type 2) ([place])   . ESRD (end stage renal disease) ([place])   . History of benign colon tumor     Past Surgical History:  Procedure Laterality Date  . AV FISTULA PLACEMENT Right 01/30/2016   Procedure: Brachial vein transposistion first stage, right ;  Surgeon: Conrad [place], MD;  Location: [place];  Service: Vascular;  Laterality: Right;  . AV FISTULA PLACEMENT Right 05/21/2016   Procedure: INSERTION OF 4-7MM X 45CM ARTERIOVENOUS (AV) GORE-TEX GRAFT ARM RIGHT UPPER ARM;  Surgeon: Conrad [place], MD;  Location: [place];  Service: Vascular;  Laterality: Right;  . COLECTOMY    . COLONOSCOPY    . CYSTOSCOPY    . EYE SURGERY Left    cararact with lens  . HIP ARTHROPLASTY Right 07/14/2014   Procedure: ARTHROPLASTY BIPOLAR HIP;  Surgeon: Rozanna Box, MD;  Location: [place];  Service: Orthopedics;  Laterality: Right;  . LIGATION OF ARTERIOVENOUS  FISTULA Right 05/21/2016   Procedure: LIGATION OF BRACHIAL VEIN TRANSPOSITION UPPER ARM;  Surgeon: Conrad [place], MD;  Location: [place];  Service: Vascular;  Laterality: Right;  . THORACIC AORTIC ENDOVASCULAR STENT GRAFT N/A 07/12/2014   Procedure: THORACIC AORTIC ENDOVASCULAR STENT GRAFT;  Surgeon: Serafina Mitchell, MD;  [facility];  Service: Vascular;   Laterality: N/A;  . WHIPPLE PROCEDURE  1990's    Allergies: Bactrim [sulfamethoxazole-trimethoprim]; Lisinopril; and Metformin  Medications:  Current Facility-Administered Medications:  .  iopamidol (ISOVUE-300) 61 % injection, , , ,  .  lidocaine (XYLOCAINE) 1 % (with pres) injection, , , ,  .  0.9 %  sodium chloride infusion, 250 mL, Intravenous, PRN, Salvadore Dom E, NP .  0.9 %  sodium chloride infusion, 250 mL, Intravenous, PRN, Erick Colace, NP .  diphenhydrAMINE (BENADRYL) capsule 25 mg, 25 mg, Oral, Once, Jamal Maes, MD .  diphenhydrAMINE (BENADRYL) capsule 25 mg, 25 mg, Oral, Q6H PRN, Elwyn Reach, MD, 25 mg at 10/15/17 0844 .  dorzolamide-timolol (COSOPT) 22.3-6.8 MG/ML ophthalmic solution 1 drop, 1 drop, Left Eye, BID, Erick Colace, NP, 1 drop at 10/16/17 0839 .  doxercalciferol (HECTOROL) injection 2 mcg, 2 mcg, Intravenous, Q M,W,F-HD, Jamal Maes, MD, 2 mcg at 10/15/17 1338 .  heparin injection 5,000 Units, 5,000 Units, Subcutaneous, Q8H, Chesley Mires, MD, 5,000 Units at 10/16/17 [phone] .  insulin aspart (novoLOG) injection 0-9 Units, 0-9 Units, Subcutaneous, TID WC, Erick Colace, NP, 1 Units at 10/16/17 [phone] .  iopamidol (ISOVUE-300) 61 % injection, , , ,  .  latanoprost (XALATAN) 0.005 % ophthalmic solution 1 drop, 1 drop, Left Eye, QHS, Chesley Mires, MD, 1 drop at 10/15/17 2134 .  MEDLINE mouth rinse, 15 mL, Mouth Rinse, BID, Nelda Marse[place],  Lloyd Huger, MD, 15 mL at 10/16/17 1220 .  pantoprazole (PROTONIX) EC tablet 40 mg, 40 mg, Oral, Daily, Chesley Mires, MD, 40 mg at 10/16/17 0834 .  sevelamer carbonate (RENVELA) tablet 1,600 mg, 1,600 mg, Oral, TID WC, Erick Colace, NP, 1,600 mg at 10/16/17 0835 .  sodium chloride flush (NS) 0.9 % injection 3 mL, 3 mL, Intravenous, Q12H, Erick Colace, NP, 3 mL at 10/16/17 8119 .  sodium chloride flush (NS) 0.9 % injection 3 mL, 3 mL, Intravenous, PRN, Salvadore Dom E, NP .  sodium polystyrene (KAYEXALATE) powder  15 g, 15 g, Oral, Daily, Jamal Maes, MD    History reviewed. No pertinent family history.  Social History   Socioeconomic History  . Marital status: Single    Spouse name: None  . Number of children: None  . Years of education: None  . Highest education level: None  Social Needs  . Financial resource strain: None  . Food insecurity - worry: None  . Food insecurity - inability: None  . Transportation needs - medical: None  . Transportation needs - non-medical: None  Occupational History  . None  Tobacco Use  . Smoking status: Former Smoker    Years: 40.00    Types: Cigarettes    Last attempt to quit: 03/28/2006    Years since quitting: 11.5  . Smokeless tobacco: Never Used  . Tobacco comment: " was a light smoker."  Substance and Sexual Activity  . Alcohol use: No  . Drug use: No  . Sexual activity: None  Other Topics Concern  . None  Social History Narrative  . None     Review of Systems: A 12 point ROS discussed and pertinent positives are indicated in the HPI above.  All other systems are negative.  Review of Systems  Vital Signs: BP (!) 152/61 (BP Location: Left Arm)   Pulse 60   Temp 99.3 F (37.4 C) (Oral)   Resp 20   Ht 5\' 9"  (1.753 m)   Wt 145 lb 3.2 oz (65.9 kg)   SpO2 98%   BMI 21.44 kg/m   Physical Exam  Constitutional: He is oriented to person, place, and time. He appears well-developed. No distress.  HENT:  Head: Normocephalic.  Mouth/Throat: Oropharynx is clear and moist.  Neck: Normal range of motion. No JVD present.  Cardiovascular: Normal rate, regular rhythm and normal heart sounds.  Pulmonary/Chest: Effort normal and breath sounds normal. No respiratory distress.  Musculoskeletal:  (R)UE AVG palpable. Good pulse/thrill Hand warm  Neurological: He is alert and oriented to person, place, and time.  Skin: Skin is warm and dry.      Imaging: Ct Head Wo Contrast  Result Date: 10/13/2017 CLINICAL DATA:  Minor head trauma,  altered mental status after fall hitting forehead EXAM: CT HEAD WITHOUT CONTRAST TECHNIQUE: Contiguous axial images were obtained from the base of the skull through the vertex without intravenous contrast. COMPARISON:  CT brain scan of 07/12/2014 FINDINGS: Brain: The ventricular system remains prominent, as are the cortical sulci, indicative of diffuse atrophy. Mild small vessel ischemic changes again noted throughout the periventricular white matter. No hemorrhage, mass lesion, or acute infarction is seen. The fourth ventricle and basilar cisterns are unremarkable. Vascular: No vascular abnormality is seen on this unenhanced study. Skull: On bone window images, no acute calvarial abnormality is seen. Sinuses/Orbits: Sclerotic focus within the left ethmoid air cells may represent osteoma. Nodular areas within the maxillary sinuses are most consistent with either retention  cysts or polyps. Other: None. IMPRESSION: 1. Stable atrophy and mild small vessel ischemic change. No acute intracranial abnormality. 2. Nodular soft tissue lesions within the maxillary sinus may represent retention cysts or polyps. Electronically Signed   By: Ivar Drape M.D.   On: 10/13/2017 16:41   Dg Chest Portable 1 View  Result Date: 10/13/2017 CLINICAL DATA:  Bradycardia EXAM: PORTABLE CHEST 1 VIEW COMPARISON:  01/22/2016 FINDINGS: AP portable semi upright view of the chest. Borderline cardiomegaly is noted with aortic stent graft in place, unchanged in appearance. Mild vascular congestion is noted without acute pneumonic consolidation. No effusion or pneumothorax. A right upper extremity vascular stent is now noted. The dialysis catheter is no longer present. External cardiac monitoring leads and defibrillator paddles are noted. There is no acute osseous abnormality. IMPRESSION: Mild vascular congestion with borderline cardiomegaly is noted. Electronically Signed   By: Ashley Royalty M.D.   On: 10/13/2017 15:00    Labs:  CBC: Recent  Labs    10/13/17 1540 10/14/17 0058 10/15/17 0553  WBC 8.4 11.8* 6.4  HGB 11.7* 13.3 12.8*  HCT 38.1* 41.1 41.6  PLT 88* 102* 76*    COAGS: No results for input(s): INR, APTT in the last 8760 hours.  BMP: Recent Labs    10/13/17 1745 10/14/17 0058 10/14/17 1124 10/15/17 0553  NA 136 141 137 134*  K >7.5* 5.0 6.7* 5.4*  CL 107 96* 94* 98*  CO2 14* 30 31 26   GLUCOSE 210* 21* 113* 104*  BUN 46* 15 20 12   CALCIUM 10.4* 9.3 8.9 9.0  CREATININE 12.28* 6.62* 7.61* 6.15*  GFRNONAA 4* 7* 6* 8*  GFRAA 4* 9* 7* 9*    LIVER FUNCTION TESTS: Recent Labs    10/13/17 1745 10/14/17 1124 10/15/17 0553  BILITOT 2.0*  --   --   AST 37  --   --   ALT 18  --   --   ALKPHOS 61  --   --   PROT 6.1*  --   --   ALBUMIN 3.2* 3.1* 3.2*    TUMOR MARKERS: No results for input(s): AFPTM, CEA, CA199, CHROMGRNA in the last 8760 hours.  Assessment and Plan: ESRD Shuntogram of (R)UE AVG performed, confirms stenosis. Plan for angioplasty, possible stent. Labs ok Risks and benefits of angioplasty of (R)UE AVG were discussed with the patient including, but not limited to bleeding, infection, vascular injury or contrast induced renal failure.  This interventional procedure involves the use of X-rays and because of the nature of the planned procedure, it is possible that we will have prolonged use of X-ray fluoroscopy.  Potential radiation risks to you include (but are not limited to) the following: - A slightly elevated risk for cancer  several years later in life. This risk is typically less than 0.5% percent. This risk is low in comparison to the normal incidence of human cancer, which is 33% for women and 50% for men according to the [employer]. - Radiation induced injury can include skin redness, resembling a rash, tissue breakdown / ulcers and hair loss (which can be temporary or permanent).   The likelihood of either of these occurring depends on the difficulty of the  procedure and whether you are sensitive to radiation due to previous procedures, disease, or genetic conditions.   IF your procedure requires a prolonged use of radiation, you will be notified and given written instructions for further action.  It is your responsibility to monitor the irradiated area  for the 2 weeks following the procedure and to notify your physician if you are concerned that you have suffered a radiation induced injury.    All of the patient's questions were answered, patient is agreeable to proceed.  Consent signed and in chart.   Thank you for this interesting consult.  I greatly enjoyed meeting Travis Carlson and look forward to participating in their care.  A copy of this report was sent to the requesting provider on this date.  Electronically Signed: Ascencion Dike, PA-C 10/16/2017, 12:16 PM   I spent a total of 20 minutes in face to face in clinical consultation, greater than 50% of which was counseling/coordinating care for shuntogram with angioplasty

## 2017-10-16 NOTE — Progress Notes (Signed)
Patient ID: Travis Carlson, male   DOB: 15-Jul-1946, 72 y.o.   MRN: [id number]  PROGRESS NOTE    Travis Carlson  YJE:[id number] DOB: 05-31-1946 DOA: 10/13/2017 PCP: Parke Poisson, MD   Outpatient Specialists: [facility]   Brief Narrative: 72 yo male former smoker with hx of ESRD followed at [facility] and [facility] has several admissions for hyperkalemia.  He presented to [facility] after fall, and found to have hyperkalemia with bradycardia causing altered mental status.  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 Patient was under the care of pulmonology in the ICU but currently on the floor and doing much better. He still has hyperkalemia today but the heart rate is much better.    Assessment & Plan:   Active Problems:   Bradycardia   #1 Abnormal HD Fistula: Patient has IR consult for ballon plasty of his HD fistula. He is now post-op but having cramps in his arms and legs. Not ready for DC.  #2 symptom to bradycardia: This appears to be resolved. Still on monitor. He is being dialyzed today. Continue monitoring.  #3 end-stage renal disease: Again patient has dialysis Mondays Wednesday some Fridays. We'll continue with dialysis at this point.  #4 coronary artery disease: Appears to be at baseline. Continue supportive care  #5 diabetes: Blood sugar appears well controlled. No change in medications. Continue sliding scale insulin  #6 metabolic encephalopathy: Much better and resolving. Continue close monitoring.   DVT prophylaxis: Heparin Code Status: Full Family Communication: None around  Disposition Plan: Home  Consultants:   Dr Lorrene Reid, Nephrology  Procedures: None  Antimicrobials: None  Subjective: Patient is having weakness and malaise with crampos in his legs.  Objective: Vitals:   10/16/17 1325 10/16/17 1330 10/16/17 1336 10/16/17 1636  BP: 124/66 (!) 148/66 (!) 141/71 (!) 174/91   Pulse: 72 62 60 64  Resp: 14 15 17    Temp:      TempSrc:      SpO2: 100% 99% 98% 98%  Weight:      Height:        Intake/Output Summary (Last 24 hours) at 10/16/2017 1644 Last data filed at 10/16/2017 1137 Gross per 24 hour  Intake 480 ml  Output 1800 ml  Net -1320 ml   Filed Weights   10/15/17 0945 10/15/17 1350 10/16/17 0445  Weight: 68.9 kg (151 lb 14.4 oz) 67.7 kg (149 lb 4 oz) 65.9 kg (145 lb 3.2 oz)    Examination:  General exam: Appears calm and comfortable  Respiratory system: Clear to auscultation. Respiratory effort normal. Cardiovascular system: S1 & S2 heard, RRR. No JVD, murmurs, rubs, gallops or clicks. No pedal edema. Gastrointestinal system: Abdomen is nondistended, soft and nontender. No organomegaly or masses felt. Normal bowel sounds heard. Central nervous system: Alert and oriented. No focal neurological deficits. Extremities: Symmetric 5 x 5 power. Skin: No rashes, lesions or ulcers Psychiatry: Judgement and insight appear normal. Mood & affect appropriate.     Data Reviewed: I have personally reviewed following labs and imaging studies  CBC: Recent Labs  Lab 10/13/17 1540 10/14/17 0058 10/15/17 0553  WBC 8.4 11.8* 6.4  NEUTROABS 7.6  --   --   HGB 11.7* 13.3 12.8*  HCT 38.1* 41.1 41.6  MCV 97.2 94.1 96.5  PLT 88* 102* 76*   Basic Metabolic Panel: Recent Labs  Lab 10/13/17 1745 10/13/17 1812 10/14/17 0058 10/14/17 1124 10/15/17 0553  NA  136  --  141 137 134*  K >7.5*  --  5.0 6.7* 5.4*  CL 107  --  96* 94* 98*  CO2 14*  --  30 31 26   GLUCOSE 210*  --  21* 113* 104*  BUN 46*  --  15 20 12   CREATININE 12.28*  --  6.62* 7.61* 6.15*  CALCIUM 10.4*  --  9.3 8.9 9.0  MG  --   --  2.0  --   --   PHOS  --  8.1*  --  6.2* 5.4*   GFR: Estimated Creatinine Clearance: 10.1 mL/min (A) (by C-G formula based on SCr of 6.15 mg/dL (H)). Liver Function Tests: Recent Labs  Lab 10/13/17 1745 10/14/17 1124 10/15/17 0553  AST 37  --   --   ALT  18  --   --   ALKPHOS 61  --   --   BILITOT 2.0*  --   --   PROT 6.1*  --   --   ALBUMIN 3.2* 3.1* 3.2*   No results for input(s): LIPASE, AMYLASE in the last 168 hours. No results for input(s): AMMONIA in the last 168 hours. Coagulation Profile: No results for input(s): INR, PROTIME in the last 168 hours. Cardiac Enzymes: Recent Labs  Lab 10/13/17 1745 10/13/17 2159 10/14/17 0058 10/14/17 1124  TROPONINI 0.07* 0.09* 0.13* 0.12*   BNP (last 3 results) No results for input(s): PROBNP in the last 8760 hours. HbA1C: No results for input(s): HGBA1C in the last 72 hours. CBG: Recent Labs  Lab 10/15/17 1845 10/15/17 2151 10/16/17 0739 10/16/17 1110 10/16/17 1636  GLUCAP 241* 119* 150* 149* 142*   Lipid Profile: No results for input(s): CHOL, HDL, LDLCALC, TRIG, CHOLHDL, LDLDIRECT in the last 72 hours. Thyroid Function Tests: No results for input(s): TSH, T4TOTAL, FREET4, T3FREE, THYROIDAB in the last 72 hours. Anemia Panel: No results for input(s): VITAMINB12, FOLATE, FERRITIN, TIBC, IRON, RETICCTPCT in the last 72 hours. Urine analysis:    Component Value Date/Time   COLORURINE YELLOW 01/22/2016 1636   APPEARANCEUR TURBID (A) 01/22/2016 1636   LABSPEC 1.015 01/22/2016 1636   PHURINE 7.5 01/22/2016 1636   GLUCOSEU NEGATIVE 01/22/2016 1636   HGBUR SMALL (A) 01/22/2016 1636   BILIRUBINUR NEGATIVE 01/22/2016 1636   KETONESUR NEGATIVE 01/22/2016 1636   PROTEINUR 100 (A) 01/22/2016 1636   UROBILINOGEN 0.2 07/12/2014 0726   NITRITE NEGATIVE 01/22/2016 1636   LEUKOCYTESUR LARGE (A) 01/22/2016 1636   Sepsis Labs: @LABRCNTIP (procalcitonin:4,lacticidven:4)  ) Recent Results (from the past 240 hour(s))  MRSA PCR Screening     Status: None   Collection Time: 10/13/17  5:03 PM  Result Value Ref Range Status   MRSA by PCR NEGATIVE NEGATIVE Final    Comment:        The GeneXpert MRSA Assay (FDA approved for NASAL specimens only), is one component of a comprehensive MRSA  colonization surveillance program. It is not intended to diagnose MRSA infection nor to guide or monitor treatment for MRSA infections. Performed at [facility], [place] [address] [place], [place] [address]          Radiology Studies: No results found.      Scheduled Meds: . dorzolamide-timolol  1 drop Left Eye BID  . doxercalciferol  2 mcg Intravenous Q M,W,F-HD  . fentaNYL      . heparin injection (subcutaneous)  5,000 Units Subcutaneous Q8H  . insulin aspart  0-9 Units Subcutaneous TID WC  . latanoprost  1 drop  Left Eye QHS  . lidocaine      . mouth rinse  15 mL Mouth Rinse BID  . midazolam      . pantoprazole  40 mg Oral Daily  . sevelamer carbonate  1,600 mg Oral TID WC  . sodium chloride flush  3 mL Intravenous Q12H  . sodium polystyrene  15 g Oral Daily   Continuous Infusions: . sodium chloride    . sodium chloride       LOS: 3 days    Time spent: 33 minutes    Merdith Adan,LAWAL, MD [facility] Pager [phone] If 7PM-7AM, please contact night-coverage [email or web address] Password [facility] 10/16/2017, 4:44 PM

## 2017-10-16 NOTE — Plan of Care (Signed)
  Education: Knowledge of General Education information will improve 10/16/2017 1106 - Progressing by Abner Greenspan, RN   Health Behavior/Discharge Planning: Ability to manage health-related needs will improve 10/16/2017 1106 - Progressing by Abner Greenspan, RN   Clinical Measurements: Ability to maintain clinical measurements within normal limits will improve 10/16/2017 1106 - Progressing by Abner Greenspan, RN   Clinical Measurements: Will remain free from infection 10/16/2017 1106 - Progressing by Abner Greenspan, RN   Activity: Risk for activity intolerance will decrease 10/16/2017 1106 - Progressing by Abner Greenspan, RN   Nutrition: Adequate nutrition will be maintained 10/16/2017 1106 - Progressing by Abner Greenspan, RN

## 2017-10-16 NOTE — Consult Note (Addendum)
[place] Nurse ostomy consult: Consult requested for ostomy assistance.  Pt was admitted unexpectedly and did not bring his ostomy supplies from home.  He uses a [employer] 2 piece convex pouching system: 2 1/4 inches with a belt. The current pouch is leaking and patient has been given Kayexalate.   He has a [facility] which has been in place for approx 20 years or more.  This special type of ostomy must be drained with a 30cc tube 3 times a day.  Pt states he is able to perform the drainage process and pouch changes without assistance prior to admission, but he has been ill and less responsive since admission and is unable to state when the pouch was last drained with a catheter, and no one was aware of this medical condition since he was unable to tell them. He has a large amt liquid stool draining into and leaking around the current pouch.  Assessed the stoma digitally, and there is no obstruction palpable. He states he has been having abd pain and cramping. Inserted red rubber catheter and only obtained a small amt liquid stool, since the rest has already leaked out of the stoma.   Stoma is red and viable, 1 inch, below skin level. Applied 2 3/4 inch 2 piece system with barrier ring to maintain a seal until convex pouching system could be obtained.  We do not carry a 2 piece convex pouching system in the [place]. Provided patient with 3 sets of 2 piece convex pouching systems which were free samples we had in the cabinet for his use. He is  well-informed regarding ostomy care and denies further questions or need for assistance.  Plan: Bedside nurse: Please assist patient with inserting the red rubber catheter approx 6 inches into the stoma.  This can either be done by threading the cathter though the spout at the bottom of the pouch, or unclipping the pouch and inserting the catheter for several minutes.  He is independent with this process prior to admission. Use convex wafer (left at the  bedside) and pouch, Lawson # [id number]. DO NOT THROW AWAY RED RUBBER catheter, please rinse off after use and store in the wrapper at the bedside.  Please re-consult if further assistance is needed.  Thank-you,  Julien Girt MSN, [place], [place], [place], [place]

## 2017-10-16 NOTE — Plan of Care (Signed)
  Pain Managment: General experience of comfort will improve 10/16/2017 0739 - Completed/Met by Evert Kohl, RN   Skin Integrity: Risk for impaired skin integrity will decrease 10/16/2017 0739 - Completed/Met by Evert Kohl, RN

## 2017-10-16 NOTE — Care Management Important Message (Signed)
Important Message  Patient Details  Name: Travis Carlson MRN: [id number] Date of Birth: 07-22-1946   Medicare Important Message Given:  Yes    [name] 10/16/2017, 1:11 PM

## 2017-10-16 NOTE — Consult Note (Addendum)
Neurology Consultation  Reason for Consult: Altered level of consciousness-code stroke on the floor Referring Physician: Dr. Jonelle Sidle  CC: Altered level of consciousness, possible right-sided weakness  History is obtained from: Patient's nurse, chart  HPI: Travis Carlson is a 72 y.o. male who has a past medical history of end-stage renal disease on dialysis, diabetes, blind in the right eye at baseline, who has been admitted for management and evaluation of hyperkalemia. He was brought in on 10/13/2017 after having been found down.  Labs revealed hyperkalemia with potassium greater than 8.  He was also bradycardic at that time.  He required transvenous pacing. He was initially admitted to the ICU and his hyperkalemia was managed. He was transferred to the floor.  This evening, he was last seen normal around 5 PM.  Upon repeat assessment by the nursing staff around 7 PM, he was noted to be more lethargic and with possible right-sided weakness and facial droop. On my assessment, the patient was very drowsy, difficult to awake, woke up to loud voice and noxious edematous.  I had a nonfocal exam as documented below.  Of note, he has received dialysis today as well as a IR guided shuntogram and graft/venous angioplasty of his AV graft. He was taken for an emergent noncontrast CT of the head as a part of the code stroke protocol.   LKW: 5 PM on 10/16/2017 tpa given?: no, exam more consistent with encephalopathy Premorbid modified Rankin scale (mRS): Unable to reliably ascertain- patient encephalopathic-at best mRS3  ROS: Unable to obtain due to altered mental status.   Past Medical History:  Diagnosis Date  . Arthritis   . 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 Cancer ([place])    "low grade"  . CKD (chronic kidney disease)   . DM2 (diabetes mellitus, type 2) ([place])   . ESRD (end stage renal disease) ([place])   . History of benign colon tumor    History reviewed. No pertinent family  history.  Social History:   reports that he quit smoking about 11 years ago. His smoking use included cigarettes. He quit after 40.00 years of use. he has never used smokeless tobacco. He reports that he does not drink alcohol or use drugs.  Medications  Current Facility-Administered Medications:  .  0.9 %  sodium chloride infusion, 250 mL, Intravenous, PRN, Salvadore Dom E, NP .  0.9 %  sodium chloride infusion, 250 mL, Intravenous, PRN, Erick Colace, NP .  diphenhydrAMINE (BENADRYL) capsule 25 mg, 25 mg, Oral, Q6H PRN, Elwyn Reach, MD, 25 mg at 10/15/17 0844 .  dorzolamide-timolol (COSOPT) 22.3-6.8 MG/ML ophthalmic solution 1 drop, 1 drop, Left Eye, BID, Erick Colace, NP, 1 drop at 10/16/17 0839 .  doxercalciferol (HECTOROL) injection 2 mcg, 2 mcg, Intravenous, Q M,W,F-HD, Jamal Maes, MD, 2 mcg at 10/15/17 1338 .  fentaNYL (SUBLIMAZE) 100 MCG/2ML injection, , , ,  .  heparin injection 5,000 Units, 5,000 Units, Subcutaneous, Q8H, Chesley Mires, MD, 5,000 Units at 10/16/17 1601 .  insulin aspart (novoLOG) injection 0-9 Units, 0-9 Units, Subcutaneous, TID WC, Erick Colace, NP, 1 Units at 10/16/17 1812 .  latanoprost (XALATAN) 0.005 % ophthalmic solution 1 drop, 1 drop, Left Eye, QHS, Chesley Mires, MD, 1 drop at 10/15/17 2134 .  lidocaine (XYLOCAINE) 1 % (with pres) injection, , , ,  .  MEDLINE mouth rinse, 15 mL, Mouth Rinse, BID, Rush Farmer, MD, 15 mL at 10/16/17 1220 .  midazolam (  VERSED) 2 MG/2ML injection, , , ,  .  pantoprazole (PROTONIX) EC tablet 40 mg, 40 mg, Oral, Daily, Chesley Mires, MD, 40 mg at 10/16/17 0834 .  sevelamer carbonate (RENVELA) tablet 1,600 mg, 1,600 mg, Oral, TID WC, Erick Colace, NP, 1,600 mg at 10/16/17 0835 .  sodium chloride flush (NS) 0.9 % injection 3 mL, 3 mL, Intravenous, Q12H, Erick Colace, NP, 3 mL at 10/16/17 5027 .  sodium chloride flush (NS) 0.9 % injection 3 mL, 3 mL, Intravenous, PRN, Erick Colace, NP .  sodium  polystyrene (KAYEXALATE) powder 15 g, 15 g, Oral, Daily, Jamal Maes, MD, 15 g at 10/16/17 1549   Exam: Current vital signs: BP 118/63 (BP Location: Left Arm)   Pulse 87   Temp 99.3 F (37.4 C) (Oral)   Resp 17   Ht 5\' 9"  (1.753 m)   Wt 65.9 kg (145 lb 3.2 oz)   SpO2 95%   BMI 21.44 kg/m  Vital signs in last 24 hours: Temp:  [98.4 F (36.9 C)-99.3 F (37.4 C)] 99.3 F (37.4 C) (03/07 0445) Pulse Rate:  [60-97] 87 (03/07 1916) Resp:  [14-20] 17 (03/07 1336) BP: (118-174)/(61-91) 118/63 (03/07 1916) SpO2:  [65 %-100 %] 95 % (03/07 1916) Weight:  [65.9 kg (145 lb 3.2 oz)] 65.9 kg (145 lb 3.2 oz) (03/07 0445) General: Patient is a very drowsy and difficult to awake, no acute distress. HEENT: Normocephalic, bruise over the left eye/left forehead.  Dry mucous membranes Lungs clear to auscultation Cardiovascular: S1-S2 heard, regular rate rhythm.  Normotensive Abdomen: Soft nondistended nontender Extremities warm well perfused with intact peripheral pulses. Neurological exam  patient is very drowsy, difficult to awake.  Opens eyes to loud voice and noxious edematous. He follows some simple commands. Speech is mildly dysarthric poor attention concentration Naming is intact.  Impaired repetition.  Comprehension mildly impaired. Cranial nerves: Right eye corneal opacity, left pupil round reactive to light, extra ocular movements intact, visual fields full on the left eye, nasolabial fold symmetric but right angle of the mouth possibly slightly droopy, auditory acuity intact to conversation.  Palate elevates symmetrically.  Tongue midline. Motor exam: He is able to elevate both his upper extremities against gravity but unable to sustain reliably and consistently against gravity.  Able to lift both his legs against gravity but they fall to the bed immediately.  Withdraws strongly to noxious stimulus in both lower extremities. Sensory exam: Intact sensation to light touch as well as  noxious stimulus all over Coordination: Difficult to assess given his poor attention concentration Gait examination was deferred at this time  NIHSS 1a Level of Conscious.: 1 1b LOC Questions: 1 1c LOC Commands: 1 2 Best Gaze: 0 3 Visual: 0 4 Facial Palsy: 1 5a Motor Arm - left: 1 5b Motor Arm - Right: 1 6a Motor Leg - Left: 2 6b Motor Leg - Right: 2 7 Limb Ataxia: 0 8 Sensory: 0 9 Best Language: 1 10 Dysarthria: 1 11 Extinct. and Inatten.: 0 TOTAL: 12  Labs I have reviewed labs in epic and the results pertinent to this consultation are: CBC    Component Value Date/Time   WBC 6.4 10/15/2017 0553   RBC 4.31 10/15/2017 0553   HGB 12.8 (L) 10/15/2017 0553   HCT 41.6 10/15/2017 0553   PLT 76 (L) 10/15/2017 0553   MCV 96.5 10/15/2017 0553   MCH 29.7 10/15/2017 0553   MCHC 30.8 10/15/2017 0553   RDW 19.3 (H) 10/15/2017 7412  LYMPHSABS 0.6 (L) 10/13/2017 1540   MONOABS 0.1 10/13/2017 1540   EOSABS 0.1 10/13/2017 1540   BASOSABS 0.0 10/13/2017 1540    CMP     Component Value Date/Time   NA 134 (L) 10/15/2017 0553   K 5.4 (H) 10/15/2017 0553   CL 98 (L) 10/15/2017 0553   CO2 26 10/15/2017 0553   GLUCOSE 104 (H) 10/15/2017 0553   BUN 12 10/15/2017 0553   CREATININE 6.15 (H) 10/15/2017 0553   CALCIUM 9.0 10/15/2017 0553   PROT 6.1 (L) 10/13/2017 1745   ALBUMIN 3.2 (L) 10/15/2017 0553   AST 37 10/13/2017 1745   ALT 18 10/13/2017 1745   ALKPHOS 61 10/13/2017 1745   BILITOT 2.0 (H) 10/13/2017 1745   GFRNONAA 8 (L) 10/15/2017 0553   GFRAA 9 (L) 10/15/2017 0553    Lipid Panel     Component Value Date/Time   TRIG 158 (H) 07/15/2014 1225   No labs for today Fingerstick glucose 170 ABG - mild hypoxia.  Imaging I have reviewed the images obtained:  CT-scan of the brain -no acute changes.  All vessels are hyperdense, possibly related to the IR procedure done earlier in the day.  Assessment:  72 year old man past history of end-stage renal disease on dialysis,  hyperkalemia, diabetes, blind in the right eye admitted for management evaluation of hyperkalemia and bradycardia noted to have sudden onset of altered mental status and possible right facial droop and right-sided weakness. On my exam, other than the right facial subtle droop, he is generally weak and nonfocal on exam. Noncontrast CT of the head is unremarkable for any acute process. Most likely toxic metabolic encephalopathy in the setting of underlying toxic metabolic derangements.  Recommendations: Check labs-CBC, CMP, PT/INR, APTT, ammonia levels. Correction of metabolic derangements per primary team. Cardiac monitoring I would request that the patient be moved to a stepdown unit for closer neuro monitoring.  Next line frequent neuro checks-every 2 hours MRI of the brain without contrast when possible. He is not a candidate for IV TPA due to nonfocal exam.  He is not a candidate for endovascular thrombectomy due to lack of signs of large vessel occlusion.  I decided not to do a CT angiogram at this time given his impaired renal status and nonfocal exam.  Neurology will follow with you.  -- Amie Portland, MD [facility] Pager: [phone] If 7pm to 7am, please call on call as listed on AMION.  CRITICAL CARE ATTESTATION This patient is critically ill and at significant risk of neurological worsening, death and care requires constant monitoring of vital signs, hemodynamics,respiratory and cardiac monitoring. I spent 55  minutes of neurocritical care time performing neurological assessment, discussion with family, other specialists and medical decision making of high complexityin the care of  this patient.

## 2017-10-17 ENCOUNTER — Inpatient Hospital Stay (HOSPITAL_COMMUNITY): Payer: Medicare Other

## 2017-10-17 ENCOUNTER — Encounter (HOSPITAL_COMMUNITY): Payer: Self-pay | Admitting: Diagnostic Radiology

## 2017-10-17 DIAGNOSIS — I9589 Other hypotension: Secondary | ICD-10-CM

## 2017-10-17 DIAGNOSIS — Z992 Dependence on renal dialysis: Secondary | ICD-10-CM

## 2017-10-17 DIAGNOSIS — E861 Hypovolemia: Secondary | ICD-10-CM

## 2017-10-17 DIAGNOSIS — N186 End stage renal disease: Secondary | ICD-10-CM

## 2017-10-17 DIAGNOSIS — R531 Weakness: Secondary | ICD-10-CM

## 2017-10-17 DIAGNOSIS — D72829 Elevated white blood cell count, unspecified: Secondary | ICD-10-CM

## 2017-10-17 LAB — RENAL FUNCTION PANEL
ALBUMIN: 3.9 g/dL (ref 3.5–5.0)
ALBUMIN: 3.3 g/dL — AB (ref 3.5–5.0)
ANION GAP: 17 — AB (ref 5–15)
ANION GAP: 19 — AB (ref 5–15)
Albumin: 3.7 g/dL (ref 3.5–5.0)
Anion gap: 22 — ABNORMAL HIGH (ref 5–15)
BUN: 33 mg/dL — ABNORMAL HIGH (ref 6–20)
BUN: 35 mg/dL — AB (ref 6–20)
BUN: 26 mg/dL — AB (ref 6–20)
CALCIUM: 9.9 mg/dL (ref 8.9–10.3)
CALCIUM: 9 mg/dL (ref 8.9–10.3)
CO2: 20 mmol/L — ABNORMAL LOW (ref 22–32)
CO2: 19 mmol/L — AB (ref 22–32)
CO2: 21 mmol/L — ABNORMAL LOW (ref 22–32)
CREATININE: 9.03 mg/dL — AB (ref 0.61–1.24)
CREATININE: 6.18 mg/dL — AB (ref 0.61–1.24)
Calcium: 9.7 mg/dL (ref 8.9–10.3)
Chloride: 91 mmol/L — ABNORMAL LOW (ref 101–111)
Chloride: 90 mmol/L — ABNORMAL LOW (ref 101–111)
Chloride: 88 mmol/L — ABNORMAL LOW (ref 101–111)
Creatinine, Ser: 9.54 mg/dL — ABNORMAL HIGH (ref 0.61–1.24)
GFR calc Af Amer: 6 mL/min — ABNORMAL LOW (ref >60)
GFR calc Af Amer: 6 mL/min — ABNORMAL LOW (ref >60)
GFR calc Af Amer: 9 mL/min — ABNORMAL LOW (ref >60)
GFR calc non Af Amer: 5 mL/min — ABNORMAL LOW (ref >60)
GFR calc non Af Amer: 5 mL/min — ABNORMAL LOW (ref >60)
GFR, EST NON AFRICAN AMERICAN: 8 mL/min — AB (ref >60)
GLUCOSE: 139 mg/dL — AB (ref 65–99)
GLUCOSE: 194 mg/dL — AB (ref 65–99)
Glucose, Bld: 282 mg/dL — ABNORMAL HIGH (ref 65–99)
PHOSPHORUS: 7.3 mg/dL — AB (ref 2.5–4.6)
PHOSPHORUS: 8.4 mg/dL — AB (ref 2.5–4.6)
Phosphorus: 7.9 mg/dL — ABNORMAL HIGH (ref 2.5–4.6)
Potassium: 7.3 mmol/L (ref 3.5–5.1)
Potassium: 7.5 mmol/L (ref 3.5–5.1)
Potassium: 4.9 mmol/L (ref 3.5–5.1)
SODIUM: 128 mmol/L — AB (ref 135–145)
SODIUM: 128 mmol/L — AB (ref 135–145)
SODIUM: 131 mmol/L — AB (ref 135–145)

## 2017-10-17 LAB — GLUCOSE, CAPILLARY
GLUCOSE-CAPILLARY: 341 mg/dL — AB (ref 65–99)
Glucose-Capillary: 197 mg/dL — ABNORMAL HIGH (ref 65–99)
Glucose-Capillary: 165 mg/dL — ABNORMAL HIGH (ref 65–99)

## 2017-10-17 LAB — PROCALCITONIN: Procalcitonin: 16.8 ng/mL

## 2017-10-17 LAB — MAGNESIUM: Magnesium: 1.9 mg/dL (ref 1.7–2.4)

## 2017-10-17 MED ORDER — VANCOMYCIN HCL IN DEXTROSE 750-5 MG/150ML-% IV SOLN: 750.0000 mg | INTRAVENOUS | Status: DC

## 2017-10-17 MED ORDER — VANCOMYCIN HCL 10 G IV SOLR
1500.0000 mg | Freq: Once | INTRAVENOUS | Status: AC
  Administered 2017-10-17: 1500 mg via INTRAVENOUS
  Filled 2017-10-17: qty 1500

## 2017-10-17 MED ORDER — PIPERACILLIN-TAZOBACTAM 3.375 G IVPB
3.3750 g | Freq: Two times a day (BID) | INTRAVENOUS | Status: DC
  Administered 2017-10-17 – 2017-10-20 (×6): 3.375 g via INTRAVENOUS
  Filled 2017-10-17 (×7): qty 50

## 2017-10-17 MED ORDER — DOXERCALCIFEROL 4 MCG/2ML IV SOLN
INTRAVENOUS | Status: AC
  Administered 2017-10-17: 2 ug via INTRAVENOUS
  Filled 2017-10-17: qty 2

## 2017-10-17 NOTE — Care Management Note (Signed)
Case Management Note  Patient Details  Name: Travis Carlson MRN: [id number] Date of Birth: [birth year]  Subjective/Objective:    Pt admitted with bradycardia. He is from home alone. Pt transferred to [place] d/t stroke like symptoms.                 Action/Plan: PT recommending CIR. CM following for d/c disposition.  Expected Discharge Date:                  Expected Discharge Plan:  [place]  In-House Referral:     Discharge planning Services  CM Consult  Post Acute Care Choice:    Choice offered to:     DME Arranged:    DME Agency:     HH Arranged:    [place] Agency:     Status of Service:  In process, will continue to follow  If discussed at Long Length of Stay Meetings, dates discussed:    Additional Comments:  Pollie Friar, RN 10/17/2017, 1:23 PM

## 2017-10-17 NOTE — Progress Notes (Signed)
Critical labs K+ of 7.5 at 2100. NP paged with order of kayexelate, calcium gluconate IV, D50, and insulin. NP at bedside.

## 2017-10-17 NOTE — Progress Notes (Signed)
PT Cancellation Note  Patient Details Name: Travis Carlson MRN: [id number] DOB: 27-Jun-1946   Cancelled Treatment:    Reason Eval/Treat Not Completed: Fatigue/lethargy limiting ability to participate; attempted to see pt this pm at RN request despite elevated potassium.  Noted no functional changes with pt in bed, but pt too fatigued and ill per his report to participate in PT.  Encouraged him to consider rehab once he feels better as will likely not be able to care for himself once he is stable to d/c. PT to follow.   Reginia Naas 10/17/2017, 5:10 PM  Magda Kiel, [place] 10/17/2017

## 2017-10-17 NOTE — Progress Notes (Signed)
Subjective: Patient states that he feels slightly stronger since dialysis.  Exam: Vitals:   10/17/17 1115 10/17/17 1135  BP: (!) 94/51 (!) 103/47  Pulse: 83 85  Resp:  17  Temp:  98.9 F (37.2 C)  SpO2:  98%   Gen: In bed, NAD Resp: non-labored breathing, no acute distress Abd: soft, nt  Neuro: MS: Awake but drowsy, he is not oriented to month, gives [facility] as location CN: Right eyes opaque, left pupil is reactive, visual fields full Motor: He possibly has mild left hemiparesis, full strength right Sensory: Intact light touch  Pertinent Labs: Potassium 7.5 Creatinine 10 Elevated phosphorus Hyponatremia  Impression: 72 year old male with likely metabolic encephalopathy.  I do think there may be some more weakness on the left than right, but unclear of chronicity.  I suspect toxic metabolic encephalopathy, but I agree that an MRI would be prudent.  Recommendations: 1) MRI brain, if negative, no further recommendations other than treatment of underlying medical condition.  Roland Rack, MD [facility] [phone]  If 7pm- 7am, please page neurology on call as listed in [place].

## 2017-10-17 NOTE — Progress Notes (Signed)
Pt arrived from [place] at 2050. Respond to voice. Oriented to self and place. Follow some commands.

## 2017-10-17 NOTE — Progress Notes (Signed)
PROGRESS NOTE  Travis Carlson KDX:[id number] DOB: 1946-02-20 DOA: 10/13/2017 PCP: Parke Poisson, MD  HPI/Recap of past 24 hours: 72 yo male former smoker with hx of ESRD followed at [facility] and [facility] has several admissions for hyperkalemia. He presented to [facility] after fall, and found to have hyperkalemia with bradycardia causing altered mental status. 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 Patient was admitted under the care of pulmonology in the ICU. Transferred to stepdown.  On the evening of 10/16/17 a code stroke was called. Neurology recommended MRI brain which has been ordered and is pending.  10/17/17: patient seen and examined at his bedside. He is alert but confused. States he feels tired post hemodialysis. Denies chest pain or dyspnea. Minimally interactive.  Assessment/Plan: Principal Problem:   Bradycardia Active Problems:   ESRD (end stage renal disease) on dialysis (HCC)   Generalized weakness   Hyperkalemia  Acute metabolic encephalopathy -MRI ordered to rule out a stroke -Suspect toxic metabolic encephalopathy possibly related to hyponatremia -Reorient as needed -Fall precaution -Neurochecks every 4 hours -Neurology following.  Highly appreciated.  Hyperkalemia -Potassium 7.5 from 7.3 -Nephrology following -Dialyzed today -Kayexalate given -Repeat BMP in the morning  Hyponatremia -In the setting of end-stage renal disease on dialysis -Nephrology following -Repeat BMP in the morning  Elevated troponin -Peaked at 0.13 -Trending down to 0.123 days ago internal (10/14/2017)  Leukocytosis with no clear source of infection -WBC 14.1 from 6.4 -Repeat CBC in the morning -Chest x-ray right lower lobe infiltrates versus atelectasis as reviewed by myself -Pro-calcitonin 16.8 -T-max 99.1 last night -Obtain blood cultures x2 -We will treat empirically -Start IV vancomycin and  Zosyn  End-stage renal disease on hemodialysis Monday Wednesday Friday -Continue hemodialysis -Had hemodialysis today 10/17/2017 -Nephrology following.  Highly appreciated  Hypomagnesemia -Magnesium 1.9 -Pathology following  GERD -Protonix p.o.   Code Status: Full  Family Communication: Not at bedside  Disposition Plan: To be determined when hemodynamically stable   Consultants:  Neurology  CCM  Procedures:  Hemodialysis 10/17/2017  Antimicrobials: IV vancomycin and IV Zosyn started today 10/17/2017  DVT prophylaxis:  SCDs, subcu heparin 5000 units 3 times daily   Objective: Vitals:   10/17/17 0735 10/17/17 0745 10/17/17 0754 10/17/17 0820  BP: (!) 95/54 (!) 92/50 (!) 103/55 (!) 110/49  Pulse: 67 70 76 63  Resp: 18 20 18 17   Temp:      TempSrc:      SpO2:  96%    Weight:      Height:        Intake/Output Summary (Last 24 hours) at 10/17/2017 0833 Last data filed at 10/17/2017 0515 Gross per 24 hour  Intake -  Output 1170 ml  Net -1170 ml   Filed Weights   10/16/17 0445 10/16/17 2045 10/17/17 0730  Weight: 65.9 kg (145 lb 3.2 oz) 68.3 kg (150 lb 9.2 oz) 65 kg (143 lb 4.8 oz)    Exam:   General: 72 year old Caucasian male well-developed well-nourished no acute distress alert but confused and minimally interactive  Cardiovascular: Regular rate and rhythm with no rubs or gallops  Respiratory: Clear to auscultation with no wheezes or rale  Abdomen: Normal bowel sounds x4 quadrant; left lower abdomen colostomy bag in place with stool.  musculoskeletal: Appears to move all extremities. Does not attempt to follow commands   Skin: Abrasion noted on the side of left eye  Psychiatry: Unable to assess due  to confusion   Data Reviewed: CBC: Recent Labs  Lab 10/13/17 1540 10/14/17 0058 10/15/17 0553 10/16/17 2009  WBC 8.4 11.8* 6.4 14.1*  NEUTROABS 7.6  --   --   --   HGB 11.7* 13.3 12.8* 16.5  HCT 38.1* 41.1 41.6 49.8  MCV 97.2 94.1 96.5 94.1   PLT 88* 102* 76* 761*   Basic Metabolic Panel: Recent Labs  Lab 10/13/17 1812 10/14/17 0058 10/14/17 1124 10/15/17 0553 10/16/17 2009 10/17/17 0354  NA  --  141 137 134* 126* 128*  K  --  5.0 6.7* 5.4* >7.5* 7.3*  CL  --  96* 94* 98* 90* 91*  CO2  --  30 31 26  20* 20*  GLUCOSE  --  21* 113* 104* 193* 139*  BUN  --  15 20 12  26* 33*  CREATININE  --  6.62* 7.61* 6.15* 7.97* 9.03*  CALCIUM  --  9.3 8.9 9.0 10.0 9.9  MG  --  2.0  --   --   --   --   PHOS 8.1*  --  6.2* 5.4*  --  7.3*   GFR: Estimated Creatinine Clearance: 6.8 mL/min (A) (by C-G formula based on SCr of 9.03 mg/dL (H)). Liver Function Tests: Recent Labs  Lab 10/13/17 1745 10/14/17 1124 10/15/17 0553 10/16/17 2009 10/17/17 0354  AST 37  --   --  24  --   ALT 18  --   --  15*  --   ALKPHOS 61  --   --  76  --   BILITOT 2.0*  --   --  3.3*  --   PROT 6.1*  --   --  7.6  --   ALBUMIN 3.2* 3.1* 3.2* 4.1 3.9   No results for input(s): LIPASE, AMYLASE in the last 168 hours. Recent Labs  Lab 10/16/17 2009  AMMONIA 37*   Coagulation Profile: Recent Labs  Lab 10/16/17 2009  INR 1.66   Cardiac Enzymes: Recent Labs  Lab 10/13/17 1745 10/13/17 2159 10/14/17 0058 10/14/17 1124  TROPONINI 0.07* 0.09* 0.13* 0.12*   BNP (last 3 results) No results for input(s): PROBNP in the last 8760 hours. HbA1C: No results for input(s): HGBA1C in the last 72 hours. CBG: Recent Labs  Lab 10/16/17 1110 10/16/17 1636 10/16/17 1858 10/16/17 2326 10/17/17 0600  GLUCAP 149* 142* 188* 280* 197*   Lipid Profile: No results for input(s): CHOL, HDL, LDLCALC, TRIG, CHOLHDL, LDLDIRECT in the last 72 hours. Thyroid Function Tests: No results for input(s): TSH, T4TOTAL, FREET4, T3FREE, THYROIDAB in the last 72 hours. Anemia Panel: No results for input(s): VITAMINB12, FOLATE, FERRITIN, TIBC, IRON, RETICCTPCT in the last 72 hours. Urine analysis:    Component Value Date/Time   COLORURINE YELLOW 01/22/2016 1636    APPEARANCEUR TURBID (A) 01/22/2016 1636   LABSPEC 1.015 01/22/2016 1636   PHURINE 7.5 01/22/2016 1636   GLUCOSEU NEGATIVE 01/22/2016 1636   HGBUR SMALL (A) 01/22/2016 1636   BILIRUBINUR NEGATIVE 01/22/2016 1636   KETONESUR NEGATIVE 01/22/2016 1636   PROTEINUR 100 (A) 01/22/2016 1636   UROBILINOGEN 0.2 07/12/2014 0726   NITRITE NEGATIVE 01/22/2016 1636   LEUKOCYTESUR LARGE (A) 01/22/2016 1636   Sepsis Labs: @LABRCNTIP (procalcitonin:4,lacticidven:4)  ) Recent Results (from the past 240 hour(s))  MRSA PCR Screening     Status: None   Collection Time: 10/13/17  5:03 PM  Result Value Ref Range Status   MRSA by PCR NEGATIVE NEGATIVE Final    Comment:  The GeneXpert MRSA Assay (FDA approved for NASAL specimens only), is one component of a comprehensive MRSA colonization surveillance program. It is not intended to diagnose MRSA infection nor to guide or monitor treatment for MRSA infections. Performed at [facility], [place] [address], [place], [place] [address]       Studies: Dg Chest Port 1 View  Result Date: 10/16/2017 CLINICAL DATA:  Altered level of consciousness EXAM: PORTABLE CHEST 1 VIEW COMPARISON:  10/13/2017, 01/22/2016 FINDINGS: Stent in the right upper extremity. Aortic vascular stent similar compared to prior. Airspace disease at the left base. No pleural effusion. Stable cardiomediastinal silhouette. No pneumothorax. IMPRESSION: 1. Airspace disease at the left lung base may reflect a pneumonia. 2. Otherwise no significant interval change compared to prior. Electronically Signed   By: Donavan Foil M.D.   On: 10/16/2017 20:34   Ct Head Code Stroke Wo Contrast  Result Date: 10/16/2017 CLINICAL DATA:  Code stroke. 72 y/o M; right facial droop and right-sided weakness. EXAM: CT HEAD WITHOUT CONTRAST TECHNIQUE: Contiguous axial images were obtained from the base of the skull through the vertex without intravenous contrast. COMPARISON:  10/13/2017 CT head.  FINDINGS: Brain: No evidence of acute infarction, hemorrhage, hydrocephalus, extra-axial collection or mass lesion/mass effect. Small chronic lacunar infarct in right thalamus. Stable chronic microvascular ischemic changes and parenchymal volume loss of the brain. Vascular: Calcific atherosclerosis of carotid siphons and vertebral arteries. Contrast is present within the vascular system. Skull: Normal. Negative for fracture or focal lesion. Sinuses/Orbits: Stable small right globe with increased density. Normal aeration of visualized paranasal sinuses and mastoid air cells. Other: None. ASPECTS [facility] Stroke Program Early CT Score) - Ganglionic level infarction (caudate, lentiform nuclei, internal capsule, insula, M1-M3 cortex): 7 - Supraganglionic infarction (M4-M6 cortex): 3 Total score (0-10 with 10 being normal): 10 IMPRESSION: 1. No acute intracranial abnormality identified. 2. ASPECTS is 10 3. Stable chronic microvascular ischemic changes and parenchymal volume loss of the brain. These results were communicated to Dr. Rory Percy at 7:44 pmon 3/7/2019by text page via the [facility] messaging system. Electronically Signed   By: Kristine Garbe M.D.   On: 10/16/2017 19:44    Scheduled Meds: . dorzolamide-timolol  1 drop Left Eye BID  . doxercalciferol      . doxercalciferol  2 mcg Intravenous Q M,W,F-HD  . heparin injection (subcutaneous)  5,000 Units Subcutaneous Q8H  . insulin aspart  0-9 Units Subcutaneous TID WC  . latanoprost  1 drop Left Eye QHS  . mouth rinse  15 mL Mouth Rinse BID  . pantoprazole  40 mg Oral Daily  . sevelamer carbonate  1,600 mg Oral TID WC  . sodium chloride flush  3 mL Intravenous Q12H  . sodium polystyrene  15 g Oral Daily    Continuous Infusions: . sodium chloride    . sodium chloride       LOS: 4 days     Kayleen Memos, MD [facility] Pager [phone]  If 7PM-7AM, please contact night-coverage [email or web address] Password [facility] 10/17/2017, 8:33 AM

## 2017-10-17 NOTE — Progress Notes (Signed)
[name]  Subjective/Interval History:  Had HD 3 days in a row for high K Suspicious of access problem/inad clearance Shuntogram high grade graft stenosis (sounds like in stent) angioplastied Recurrent hyperkalemia in house  Code stroke called last PM 2/2 lethargy and weakness TPA not given d/t non-focal exam I note K was >7.5 ? If renal was made aware? I do see was given some additional kayexalate (already getting daily)  For HD today K 7.3 again Will use 1K bath  Objective Vital signs in last 24 hours: Vitals:   10/16/17 2000 10/16/17 2045 10/17/17 0013 10/17/17 0429  BP:  (!) 114/54 (!) 118/53 (!) 110/57  Pulse: 84 75 79 74  Resp: 18 18 18 18   Temp:  99.1 F (37.3 C) 99 F (37.2 C) 98.7 F (37.1 C)  TempSrc:  Oral Oral Oral  SpO2:  94% 96% 93%  Weight:  68.3 kg (150 lb 9.2 oz)    Height:  5\' 9"  (1.753 m)     Weight change: -0.6 kg (-5.2 oz)  Intake/Output Summary (Last 24 hours) at 10/17/2017 0651 Last data filed at 10/17/2017 0515 Gross per 24 hour  Intake -  Output 1170 ml  Net -1170 ml   Physical Exam:  Blood pressure (!) 110/57, pulse 74, temperature 98.7 F (37.1 C), temperature source Oral, resp. rate 18, height 5\' 9"  (1.753 m), weight 68.3 kg (150 lb 9.2 oz), SpO2 93 %.   Older WM Slow to respond but appropriate "You tell me how I'm doing" Abrasion on forehead clearing up NAD Lungs grossly clear S1S2 No S3 Abd colostomy in place No abd tenderness No LE edema RUE AVG  Neuro oriented X3 but very slow  Recent Labs  Lab 10/13/17 1745 10/13/17 1812 10/14/17 0058 10/14/17 1124 10/15/17 0553 10/16/17 2009 10/17/17 0354  NA 136  --  141 137 134* 126* 128*  K >7.5*  --  5.0 6.7* 5.4* >7.5* 7.3*  CL 107  --  96* 94* 98* 90* 91*  CO2 14*  --  30 31 26  20* 20*  GLUCOSE 210*  --  21* 113* 104* 193* 139*  BUN 46*  --  15 20 12  26* 33*  CREATININE 12.28*  --  6.62* 7.61* 6.15* 7.97* 9.03*  CALCIUM 10.4*  --  9.3 8.9 9.0 10.0 9.9  PHOS  --  8.1*   --  6.2* 5.4*  --  7.3*    Recent Labs  Lab 10/13/17 1745  10/15/17 0553 10/16/17 2009 10/17/17 0354  AST 37  --   --  24  --   ALT 18  --   --  15*  --   ALKPHOS 61  --   --  76  --   BILITOT 2.0*  --   --  3.3*  --   PROT 6.1*  --   --  7.6  --   ALBUMIN 3.2*   < > 3.2* 4.1 3.9   < > = values in this interval not displayed.    Recent Labs  Lab 10/13/17 1540 10/14/17 0058 10/15/17 0553 10/16/17 2009  WBC 8.4 11.8* 6.4 14.1*  NEUTROABS 7.6  --   --   --   HGB 11.7* 13.3 12.8* 16.5  HCT 38.1* 41.1 41.6 49.8  MCV 97.2 94.1 96.5 94.1  PLT 88* 102* 76* 113*    Recent Labs  Lab 10/13/17 1745 10/13/17 2159 10/14/17 0058 10/14/17 1124  TROPONINI 0.07* 0.09* 0.13* 0.12*   CBG: Recent  Labs  Lab 10/16/17 1110 10/16/17 1636 10/16/17 1858 10/16/17 2326 10/17/17 0600  GLUCAP 149* 142* 188* 280* 197*   Medications: . sodium chloride    . sodium chloride     . dorzolamide-timolol  1 drop Left Eye BID  . doxercalciferol  2 mcg Intravenous Q M,W,F-HD  . heparin injection (subcutaneous)  5,000 Units Subcutaneous Q8H  . insulin aspart  0-9 Units Subcutaneous TID WC  . latanoprost  1 drop Left Eye QHS  . mouth rinse  15 mL Mouth Rinse BID  . pantoprazole  40 mg Oral Daily  . sevelamer carbonate  1,600 mg Oral TID WC  . sodium chloride flush  3 mL Intravenous Q12H  . sodium polystyrene  15 g Oral Daily   Dialysis prescription MWF [place] (lives in [place]) [place] 4 hours EDW 70 will have lower EDW at discharge 66 kg 450/A1.5 2K2.5 Ca Heparin 2000 bolus Hectorol 2 mcg No ESA or iron  Assessment/Recommendations  1. ESRD - MWF HD. K>8.5 w/profound bradycardia.  Had urgent HD 3/4, then 3/5, 3/6 for recurrent ^K. Shuntogram PTA of high grade in stent stenosis so access may have been part of the issue. K 7.3 again today - regular HD day. On daily kayexalate as well. 2. Secondary HPT - hectorol with HD 3. Weakness, lethargy - similar to what he  exhibited at time of admission. Weakness poss 2/2 K>7.5.  4. H/o colectomy w/colostomy 5. Whipple's procedure 1996 6. DM2  Jamal Maes, MD [facility] [phone] pager 10/17/2017, 6:51 AM

## 2017-10-17 NOTE — Progress Notes (Signed)
Inpatient Rehabilitation  Per PT request, patient was screened by Gunnar Fusi for appropriateness for an Inpatient Acute Rehab consult.  At this time note that patient not interested.  Note that medical work up is ongoing and if patient becomes interested then please order a consult.  Call if questions.  Carmelia Roller., CCC/SLP Admission Coordinator  [place]  Cell [phone]

## 2017-10-17 NOTE — Procedures (Signed)
I have personally attended this patient's dialysis session.   AVG cannulated, initiating HD Pre K pending 1K bath Check K later this afternoon and in AM  Jamal Maes, MD [facility] [phone] Pager 10/17/2017, 7:32 AM

## 2017-10-17 NOTE — Progress Notes (Signed)
Pharmacy Antibiotic Note  Travis Carlson is a 72 y.o. male with hd of ESRD on HD admitted on 10/13/2017 with hyperkalemia. Now suspected to be septic, pharmacy has been consulted for vancomycin and zosyn dosing. Usually on HD MWF, Had HD earlier today  Plan: - Vancomycin 1500 mg IV x 1 then 750 mg MWF after HD - Zosyn 3.375 g IV Q 12 hrs (4hr infusion)  - f/u cultures and HD schedule  Height: 5\' 9"  (175.3 cm) Weight: 143 lb 8.3 oz (65.1 kg) IBW/kg (Calculated) : 70.7  Temp (24hrs), Avg:98.6 F (37 C), Min:97.8 F (36.6 C), Max:99.1 F (37.3 C)  Recent Labs  Lab 10/13/17 1540  10/14/17 0058 10/14/17 1124 10/15/17 0553 10/16/17 2009 10/17/17 0354 10/17/17 0746  WBC 8.4  --  11.8*  --  6.4 14.1*  --   --   CREATININE  --    < > 6.62* 7.61* 6.15* 7.97* 9.03* 9.54*   < > = values in this interval not displayed.    Estimated Creatinine Clearance: 6.4 mL/min (A) (by C-G formula based on SCr of 9.54 mg/dL (H)).    Allergies  Allergen Reactions  . Bactrim [Sulfamethoxazole-Trimethoprim] Other (See Comments)    States muscle weakness for 2 days - unable to stand "it like to have killed me"   . Lisinopril Other (See Comments)    Weakness  renal function abnormalities  . Metformin Anaphylaxis    GI Upset (intolerance) "it like to have killed me I couldn't get out of my chair for three days"    Antimicrobials this admission: Vancomycin 3/8 >>  Zosyn 3/8 >>   Dose adjustments this admission:   Microbiology results: 3/8 BCx:  3/8 Sputum:  3/4 MRSA PCR: neg  Thank you for allowing pharmacy to be a part of this patient's care.  Maryanna Shape, PharmD, BCPS  Clinical Pharmacist  Pager: [phone]   10/17/2017 4:39 PM

## 2017-10-17 NOTE — Progress Notes (Signed)
K+ 7.3 at 0540. NP paged with no new order. Dialysis called for report at 0615. Per Dialysis nurse, pt will be dialyzed and nurse will notified MD in dialysis department regarding hyperkalemia.

## 2017-10-18 ENCOUNTER — Inpatient Hospital Stay (HOSPITAL_COMMUNITY): Payer: Medicare Other

## 2017-10-18 DIAGNOSIS — R52 Pain, unspecified: Secondary | ICD-10-CM

## 2017-10-18 DIAGNOSIS — N5082 Scrotal pain: Secondary | ICD-10-CM

## 2017-10-18 LAB — CBC
HEMATOCRIT: 46.6 % (ref 39.0–52.0)
HEMOGLOBIN: 15.5 g/dL (ref 13.0–17.0)
MCH: 31.1 pg (ref 26.0–34.0)
MCHC: 33.3 g/dL (ref 30.0–36.0)
MCV: 93.6 fL (ref 78.0–100.0)
Platelets: 131 10*3/uL — ABNORMAL LOW (ref 150–400)
RBC: 4.98 MIL/uL (ref 4.22–5.81)
RDW: 18.6 % — ABNORMAL HIGH (ref 11.5–15.5)
WBC: 10.3 10*3/uL (ref 4.0–10.5)

## 2017-10-18 LAB — RENAL FUNCTION PANEL
ANION GAP: 19 — AB (ref 5–15)
Albumin: 3.2 g/dL — ABNORMAL LOW (ref 3.5–5.0)
BUN: 34 mg/dL — ABNORMAL HIGH (ref 6–20)
CHLORIDE: 87 mmol/L — AB (ref 101–111)
CO2: 23 mmol/L (ref 22–32)
Calcium: 8.5 mg/dL — ABNORMAL LOW (ref 8.9–10.3)
Creatinine, Ser: 7.02 mg/dL — ABNORMAL HIGH (ref 0.61–1.24)
GFR calc Af Amer: 8 mL/min — ABNORMAL LOW (ref >60)
GFR calc non Af Amer: 7 mL/min — ABNORMAL LOW (ref >60)
GLUCOSE: 231 mg/dL — AB (ref 65–99)
POTASSIUM: 6.1 mmol/L — AB (ref 3.5–5.1)
Phosphorus: 9 mg/dL — ABNORMAL HIGH (ref 2.5–4.6)
Sodium: 129 mmol/L — ABNORMAL LOW (ref 135–145)

## 2017-10-18 LAB — GLUCOSE, CAPILLARY
GLUCOSE-CAPILLARY: 164 mg/dL — AB (ref 65–99)
Glucose-Capillary: 228 mg/dL — ABNORMAL HIGH (ref 65–99)
Glucose-Capillary: 187 mg/dL — ABNORMAL HIGH (ref 65–99)
Glucose-Capillary: 105 mg/dL — ABNORMAL HIGH (ref 65–99)
Glucose-Capillary: 154 mg/dL — ABNORMAL HIGH (ref 65–99)

## 2017-10-18 LAB — LACTIC ACID, PLASMA
LACTIC ACID, VENOUS: 2.8 mmol/L — AB (ref 0.5–1.9)
LACTIC ACID, VENOUS: 3 mmol/L — AB (ref 0.5–1.9)

## 2017-10-18 LAB — PROCALCITONIN: Procalcitonin: 39.25 ng/mL

## 2017-10-18 MED ORDER — PATIROMER SORBITEX CALCIUM 8.4 G PO PACK
8.4000 g | PACK | Freq: Every day | ORAL | Status: DC
  Administered 2017-10-18 – 2017-10-19 (×2): 8.4 g via ORAL
  Filled 2017-10-18 (×5): qty 4

## 2017-10-18 MED ORDER — SODIUM CHLORIDE 0.9 % IV BOLUS (SEPSIS)
500.0000 mL | Freq: Once | INTRAVENOUS | Status: AC
  Administered 2017-10-18: 500 mL via INTRAVENOUS

## 2017-10-18 MED ORDER — VANCOMYCIN HCL IN DEXTROSE 750-5 MG/150ML-% IV SOLN
INTRAVENOUS | Status: AC
Start: 2017-10-18 — End: 2017-10-18
  Filled 2017-10-18: qty 150

## 2017-10-18 MED ORDER — MIDODRINE HCL 5 MG PO TABS
5.0000 mg | ORAL_TABLET | Freq: Three times a day (TID) | ORAL | Status: DC
  Administered 2017-10-18 – 2017-10-21 (×11): 5 mg via ORAL
  Filled 2017-10-18 (×10): qty 1

## 2017-10-18 MED ORDER — SODIUM CHLORIDE 0.9 % IV SOLN
INTRAVENOUS | Status: DC
  Administered 2017-10-18: 08:00:00 via INTRAVENOUS
  Administered 2017-10-18: 1000 mL via INTRAVENOUS
  Administered 2017-10-20: 01:00:00 via INTRAVENOUS

## 2017-10-18 MED ORDER — VANCOMYCIN HCL IN DEXTROSE 750-5 MG/150ML-% IV SOLN
750.0000 mg | INTRAVENOUS | Status: DC
Start: 2017-10-18 — End: 2017-10-19
  Administered 2017-10-18: 750 mg via INTRAVENOUS
  Filled 2017-10-18: qty 150

## 2017-10-18 NOTE — Progress Notes (Signed)
Pt not stable enough to come down for MRI per RN. TBD later date after RN calls to confirm.

## 2017-10-18 NOTE — Progress Notes (Signed)
CRITICAL VALUE STICKER  CRITICAL VALUE:lactic acid 3.0  RECEIVER (on-site recipient of call):Carly Whiteside  DATE & TIME NOTIFIED: 10/18/17  0840  MESSENGER (representative from lab):   MD NOTIFIED: Dr Nevada Crane  TIME OF NOTIFICATION:  [phone]  RESPONSE: additional labs ordered

## 2017-10-18 NOTE — Progress Notes (Signed)
Obtained critical value for patients Lactic Acid is 2.8 will notify attending.

## 2017-10-18 NOTE — Significant Event (Addendum)
Rapid Response Event Note  Overview:  Followup  Low BP 72/51, lethargy    Initial Focused Assessment: While following up in patient's chart,  I saw BP 72/50 was documented at 0041.  I rounded to see the patient and upon arrival he was lethargic but able to answer simple questions.  BP 69/35, HR 67, no distress and pain free.  Reported per Remo Lipps RN pt pulled out his IV and did not have current IV access.  Puja RN started 20g PIV in the left arm and initiated a 500 cc NS bolus.  I text paged Arby Barrette NP and updated him on pts condition.  Order received for additional 500cc bolus if SBP< 90 and maintenance IVF.  CBG 228.  BP came up to 84/42 after first bolus, so 2nd bolus initiated.  BP to 92/39 during 2nd bolus.  Ordered morning labs obtained.   Interventions: -new PIV -NS boluses, IVF  Plan of Care (if not transferred): Contracted with Remo Lipps RN to recheck VS after bolus is finished and VS q1 hr x 2.  Came to 3W at 0510 for follow up.  Pt assessed, more arousable.  BP 86/46.  I paged Arby Barrette and another NS bolus 500 cc ordered.  RN updated and instructed to continue to monitor q1 and assess patient.        Madelynn Done

## 2017-10-18 NOTE — Progress Notes (Addendum)
[name]  Dialysis prescription MWF [place] (lives in [place]) [place] 4 hours EDW 70 will have lower EDW at discharge 66 kg 450/A1.5 2K2.5 Ca Heparin 2000 bolus Hectorol 2 mcg No ESA or iron  Assessment/Recommendations 1. ESRD - MWF HD. K>8.5 w/profound bradycardia.  Had urgent HD 3/4, then 3/5, 3/6 for recurrent ^K. Shuntogram PTA of high grade in stent stenosis so thought access may have been part of the issue. K 7.3 on  3/8 and had full TMT 1K bath. K today 6.1, lactate elevated. On daily kayexalate as well. I cannot explain this. Stop kayexalate. Add patiromer. HD today 3 hour extra treatment. 2. Leukocytosis - possible RLL infilt vs atelectasis. Procalcitonin elev. On empiric vanco and zosyn. 3. Hypotension - ? 2/2 sepsis. Dry on exam. Had fluid boluses X2. Repeat 3rd bolus now (as opposed to 75/hour continuous)  4. Secondary HPT - hectorol with HD 5. Weakness, lethargy, encephalopathy - similar to what he exhibited at time of admission. PTA was living alone and driving. Neuro has been evaluating. MR of brain recommended 6. H/o colectomy w/colostomy 7. Whipple's procedure 1996 8. DM2  Jamal Maes, MD [facility] [phone] pager 10/18/2017, 1:19 PM    Subjective/Interval History:  K high once again VERY unclear to me what the issue is Addressed and corrected his graft stenosis issues Has had HD 4 days this week, and K is up again today at 6.1 On daily kayexalate  Getting fluids for low BP, ATB's for possible PNA Still issues with generalized weakness  Objective Vital signs in last 24 hours: Vitals:   10/18/17 0853 10/18/17 0857 10/18/17 1018 10/18/17 1219  BP:  (!) 96/41 (!) 94/50 (!) 102/45  Pulse:      Resp: 16     Temp:      TempSrc:      SpO2: 95%     Weight:      Height:       Weight change: -3.3 kg (-4.4 oz)  Intake/Output Summary (Last 24 hours) at 10/18/2017 1319 Last data filed at 10/18/2017 0818 Gross per 24 hour   Intake 1556 ml  Output 1650 ml  Net -94 ml   Physical Exam:  Blood pressure (!) 102/45, pulse 69, temperature (!) 97.5 F (36.4 C), temperature source Axillary, resp. rate 16, height 5\' 9"  (1.753 m), weight 65.1 kg (143 lb 8.3 oz), SpO2 95 %.   Older WM Slow to respond but appropriate Disheveled in appearance VS as noted Lungs grossly clear S1S2 No S3 Abd colostomy in place liquid stool No abd tenderness No LE edema RUE AVG with dressing in place from IR procedure Neuro oriented X3 but very slow  Recent Labs  Lab 10/13/17 1812  10/14/17 1124 10/15/17 0553 10/16/17 2009 10/17/17 0354 10/17/17 0746 10/17/17 1721 10/18/17 0234  NA  --    < > 137 134* 126* 128* 128* 131* 129*  K  --    < > 6.7* 5.4* >7.5* 7.3* 7.5* 4.9 6.1*  CL  --    < > 94* 98* 90* 91* 90* 88* 87*  CO2  --    < > 31 26 20* 20* 19* 21* 23  GLUCOSE  --    < > 113* 104* 193* 139* 194* 282* 231*  BUN  --    < > 20 12 26* 33* 35* 26* 34*  CREATININE  --    < > 7.61* 6.15* 7.97* 9.03* 9.54* 6.18* 7.02*  CALCIUM  --    < >  8.9 9.0 10.0 9.9 9.7 9.0 8.5*  PHOS 8.1*  --  6.2* 5.4*  --  7.3* 7.9* 8.4* 9.0*   < > = values in this interval not displayed.    Recent Labs  Lab 10/13/17 1745  10/16/17 2009  10/17/17 0746 10/17/17 1721 10/18/17 0234  AST 37  --  24  --   --   --   --   ALT 18  --  15*  --   --   --   --   ALKPHOS 61  --  76  --   --   --   --   BILITOT 2.0*  --  3.3*  --   --   --   --   PROT 6.1*  --  7.6  --   --   --   --   ALBUMIN 3.2*   < > 4.1   < > 3.7 3.3* 3.2*   < > = values in this interval not displayed.    Recent Labs  Lab 10/13/17 1540 10/14/17 0058 10/15/17 0553 10/16/17 2009 10/18/17 0236  WBC 8.4 11.8* 6.4 14.1* 10.3  NEUTROABS 7.6  --   --   --   --   HGB 11.7* 13.3 12.8* 16.5 15.5  HCT 38.1* 41.1 41.6 49.8 46.6  MCV 97.2 94.1 96.5 94.1 93.6  PLT 88* 102* 76* 113* 131*    Recent Labs  Lab 10/13/17 1745 10/13/17 2159 10/14/17 0058 10/14/17 1124  TROPONINI  0.07* 0.09* 0.13* 0.12*   CBG: Recent Labs  Lab 10/17/17 1642 10/17/17 2101 10/18/17 0231 10/18/17 0623 10/18/17 1135  GLUCAP 341* 165* 228* 164* 187*   Medications: . sodium chloride    . sodium chloride    . sodium chloride 75 mL/hr at 10/18/17 0818  . piperacillin-tazobactam (ZOSYN)  IV Stopped (10/18/17 0953)  . [START ON 10/20/2017] vancomycin     . dorzolamide-timolol  1 drop Left Eye BID  . doxercalciferol  2 mcg Intravenous Q M,W,F-HD  . heparin injection (subcutaneous)  5,000 Units Subcutaneous Q8H  . insulin aspart  0-9 Units Subcutaneous TID WC  . latanoprost  1 drop Left Eye QHS  . mouth rinse  15 mL Mouth Rinse BID  . midodrine  5 mg Oral TID WC  . pantoprazole  40 mg Oral Daily  . sevelamer carbonate  1,600 mg Oral TID WC  . sodium chloride flush  3 mL Intravenous Q12H  . sodium polystyrene  15 g Oral Daily

## 2017-10-18 NOTE — Progress Notes (Signed)
Neurology Progress Note   S://  No family at bedside. Per RN report no new events reported overnight. Patient voices no new complaints, continues to complain of generalized weakness. MRI not completed due to issues of hypotension.  O:// Exam: Current vital signs: BP (!) 96/41 (BP Location: Left Arm)   Pulse 69   Temp (!) 97.5 F (36.4 C) (Axillary)   Resp 16   Ht 5\' 9"  (1.753 m)   Wt 65.1 kg (143 lb 8.3 oz)   SpO2 95%   BMI 21.19 kg/m  Vital signs in last 24 hours: Temp:  [97.5 F (36.4 C)-98.9 F (37.2 C)] 97.5 F (36.4 C) (03/09 0440) Pulse Rate:  [63-85] 69 (03/09 0440) Resp:  [16-18] 16 (03/09 0853) BP: (69-126)/(35-67) 96/41 (03/09 0857) SpO2:  [93 %-98 %] 95 % (03/09 0853) Weight:  [65.1 kg (143 lb 8.3 oz)] 65.1 kg (143 lb 8.3 oz) (03/08 1135) General: Patient is awake. no acute distress. HEENT: Normocephalic, bruise over the left eye/left forehead.  Dry mucous membranes Lungs clear to auscultation Cardiovascular: S1-S2 heard, regular rate rhythm.  Hypotensive Abdomen: Soft nondistended nontender Extremities warm well perfused with intact peripheral pulses. Neurological exam  patient is awake and attentive to examiner. Oriented x 3 He follows all commands. Including 2-step commands Speech is mildly dysarthric. poor attention and concentration. Remains easily distracted. Naming is intact. Comprehension mildly impaired. 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  Palate elevates symmetrically.  Tongue midline. Motor exam: He spontaneously moves all 4 extremities with 4+/5 strength but is visibly weak and deconditioned. Able to elevate UE's and LE's consistently against gravity. Sensory exam: Intact sensation to light  touch as well as noxious stimulus all over. Coordination: finger to nose normal but slow. Gait not tested.  Labs I have reviewed labs in epic and the results pertinent to this consultation are: CBC    Component Value Date/Time   WBC 10.3 10/18/2017 0236   RBC 4.98 10/18/2017 0236   HGB 15.5 10/18/2017 0236   HCT 46.6 10/18/2017 0236   PLT 131 (L) 10/18/2017 0236   MCV 93.6 10/18/2017 0236   MCH 31.1 10/18/2017 0236   MCHC 33.3 10/18/2017 0236   RDW 18.6 (H) 10/18/2017 0236   LYMPHSABS 0.6 (L) 10/13/2017 1540   MONOABS 0.1 10/13/2017 1540   EOSABS 0.1 10/13/2017 1540   BASOSABS 0.0 10/13/2017 1540    CMP     Component Value Date/Time   NA 129 (L) 10/18/2017 0234   K 6.1 (H) 10/18/2017 0234   CL 87 (L) 10/18/2017 0234   CO2 23 10/18/2017 0234   GLUCOSE 231 (H) 10/18/2017 0234   BUN 34 (H) 10/18/2017 0234   CREATININE 7.02 (H) 10/18/2017 0234   CALCIUM 8.5 (L) 10/18/2017 0234   PROT 7.6 10/16/2017 2009   ALBUMIN 3.2 (L) 10/18/2017 0234   AST 24 10/16/2017 2009   ALT 15 (L) 10/16/2017 2009   ALKPHOS 76 10/16/2017 2009   BILITOT 3.3 (H) 10/16/2017 2009   GFRNONAA 7 (L) 10/18/2017 0234   GFRAA 8 (L) 10/18/2017 0234   Imaging I have reviewed the images obtained:  CT-scan of the brain -no acute changes.  All vessels are hyperdense, possibly related to the IR procedure done earlier in the day.  MRI Brain    -PENDING, on hold today due to hypotension  A:// Assessment:  72 year old man with a PMH of ESRD on HD, hyperkalemia, hyponatremia, DM, blind in the right eye admitted for management evaluation of hyperkalemia and bradycardia noted to have sudden onset of altered mental status and possible right facial droop and right-sided weakness on 10/17/2017.   Patient exam this morning shows improvement in strength testing and mentation. Suspect as metabolic derangements improve so will patients mental status and strength. Will likely need intensive Rehab, at baseline likely  deconditioned due to chronic medical illnesses.   Toxic metabolic encephalopathy in the setting of underlying toxic metabolic derangements.  P:// Recommendations: Continue correction of metabolic derangements-  per primary team. MRI of the brain without contrast, when possible to rule out stroke PT/OT Consultation   [name]

## 2017-10-18 NOTE — Progress Notes (Signed)
Called by CCMD about patient having a run of PAC's, will notify attending.

## 2017-10-18 NOTE — Progress Notes (Signed)
PROGRESS NOTE  Travis Carlson QHU:[id number] DOB: 07-03-1946 DOA: 10/13/2017 PCP: Parke Poisson, MD  HPI/Recap of past 24 hours: 72 yo male former smoker with hx of ESRD followed at [facility] and [facility] has several admissions for hyperkalemia. He presented to [facility] after fall, and found to have hyperkalemia with bradycardia causing altered mental status. 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 Patient was admitted under the care of pulmonology in the ICU. Transferred to stepdown.  On the evening of 10/16/17 a code stroke was called. Neurology recommended MRI brain which has been ordered and is pending.  10/17/17: patient seen and examined at his bedside. He is alert but confused. States he feels tired post hemodialysis. Denies chest pain or dyspnea. Minimally interactive.  10/18/17: Hypotensive this am. Midodrine started 5 mg TID. reports severe testicular tenderness out of proportion to exam. Scrotal U/S ordered. More alert today but persistent weakness. Will obtain MRI today.  Assessment/Plan: Principal Problem:   Bradycardia Active Problems:   ESRD (end stage renal disease) on dialysis (HCC)   Generalized weakness   Hyperkalemia  Acute metabolic encephalopathy, resolved -MRI ordered to rule out a stroke -Suspect toxic metabolic encephalopathy possibly related to hyponatremia -Reorient as needed -Fall precaution -Neurochecks every 4 hours -Neurology following.  Highly appreciated.  Weakness, possibly multifactorial -rule out CVA -management as stated above  Severe scrotal pain with unclear etiology -scrotal U/S ordered -right testicular elevated -no rash, vesicles, or purulence noted  Hyperkalemia, improving -Potassium 6.1 from 7.5 from 7.3 -Nephrology following -Dialyzed 10/17/17 -plan to dialyse today 10/18/17 -Kayexalate given -Repeat BMP in the morning  Hyponatremia, improving -Na+ 129 from  126 -In the setting of end-stage renal disease on dialysis -Nephrology following -Repeat BMP in the morning  Elevated troponin -Peaked at 0.13 -Trended down to 0.12 (10/14/2017)  Leukocytosis with no clear source of infection, resolved -WBC 10.3 from 14.1 from 6.4 -Repeat CBC in the morning -Chest x-ray right lower lobe infiltrates versus atelectasis as reviewed by myself -Pro-calcitonin 39.25 from 16.8 -T-max 99.1 last night -blood cultures x2 no growth less than 24 hours -We will treat empirically - IV vancomycin and Zosyn  End-stage renal disease on hemodialysis Monday Wednesday Friday -Continue hemodialysis -Had hemodialysis 10/17/2017 -Nephrology following.  Highly appreciated  Hypomagnesemia -Magnesium 1.9 -nephrology following  GERD -Protonix p.o.   Code Status: Full  Family Communication: Not at bedside  Disposition Plan: To be determined when hemodynamically stable   Consultants:  Neurology  CCM  Procedures:  Hemodialysis 10/17/2017  Antimicrobials: IV vancomycin and IV Zosyn day#1 from 10/17/2017  DVT prophylaxis:  SCDs, subcu heparin 5000 units 3 times daily   Objective: Vitals:   10/18/17 0800 10/18/17 0853 10/18/17 0857 10/18/17 1018  BP: (!) 93/41  (!) 96/41 (!) 94/50  Pulse:      Resp:  16    Temp:      TempSrc:      SpO2:  95%    Weight:      Height:        Intake/Output Summary (Last 24 hours) at 10/18/2017 1131 Last data filed at 10/18/2017 0818 Gross per 24 hour  Intake 1556 ml  Output 1650 ml  Net -94 ml   Filed Weights   10/16/17 2045 10/17/17 0730 10/17/17 1135  Weight: 68.3 kg (150 lb 9.2 oz) 65 kg (143 lb 4.8 oz) 65.1 kg (143 lb 8.3 oz)    Exam:10/18/17 seen and  examined. Changes as noted below otherwise physical exam is the same.   General: 72 year old Caucasian male well-developed well-nourished no acute distress alert but confused and minimally interactive  Cardiovascular: Regular rate and rhythm with no rubs or  gallops  Respiratory: Clear to auscultation with no wheezes or rale  Abdomen: Normal bowel sounds x4 quadrant; left lower abdomen colostomy bag in place with stool.  musculoskeletal: Appears to move all extremities. Does not attempt to follow commands   Skin: Abrasion noted on the side of left eye  Psychiatry: Unable to assess due to confusion  GU: right testicular elevated and severely tender. No erythema, vesicles or purulence noted   Data Reviewed: CBC: Recent Labs  Lab 10/13/17 1540 10/14/17 0058 10/15/17 0553 10/16/17 2009 10/18/17 0236  WBC 8.4 11.8* 6.4 14.1* 10.3  NEUTROABS 7.6  --   --   --   --   HGB 11.7* 13.3 12.8* 16.5 15.5  HCT 38.1* 41.1 41.6 49.8 46.6  MCV 97.2 94.1 96.5 94.1 93.6  PLT 88* 102* 76* 113* 403*   Basic Metabolic Panel: Recent Labs  Lab 10/14/17 0058  10/15/17 0553 10/16/17 2009 10/17/17 0354 10/17/17 0746 10/17/17 1721 10/18/17 0234  NA 141   < > 134* 126* 128* 128* 131* 129*  K 5.0   < > 5.4* >7.5* 7.3* 7.5* 4.9 6.1*  CL 96*   < > 98* 90* 91* 90* 88* 87*  CO2 30   < > 26 20* 20* 19* 21* 23  GLUCOSE 21*   < > 104* 193* 139* 194* 282* 231*  BUN 15   < > 12 26* 33* 35* 26* 34*  CREATININE 6.62*   < > 6.15* 7.97* 9.03* 9.54* 6.18* 7.02*  CALCIUM 9.3   < > 9.0 10.0 9.9 9.7 9.0 8.5*  MG 2.0  --   --   --   --  1.9  --   --   PHOS  --    < > 5.4*  --  7.3* 7.9* 8.4* 9.0*   < > = values in this interval not displayed.   GFR: Estimated Creatinine Clearance: 8.8 mL/min (A) (by C-G formula based on SCr of 7.02 mg/dL (H)). Liver Function Tests: Recent Labs  Lab 10/13/17 1745  10/16/17 2009 10/17/17 0354 10/17/17 0746 10/17/17 1721 10/18/17 0234  AST 37  --  24  --   --   --   --   ALT 18  --  15*  --   --   --   --   ALKPHOS 61  --  76  --   --   --   --   BILITOT 2.0*  --  3.3*  --   --   --   --   PROT 6.1*  --  7.6  --   --   --   --   ALBUMIN 3.2*   < > 4.1 3.9 3.7 3.3* 3.2*   < > = values in this interval not displayed.    No results for input(s): LIPASE, AMYLASE in the last 168 hours. Recent Labs  Lab 10/16/17 2009  AMMONIA 37*   Coagulation Profile: Recent Labs  Lab 10/16/17 2009  INR 1.66   Cardiac Enzymes: Recent Labs  Lab 10/13/17 1745 10/13/17 2159 10/14/17 0058 10/14/17 1124  TROPONINI 0.07* 0.09* 0.13* 0.12*   BNP (last 3 results) No results for input(s): PROBNP in the last 8760 hours. HbA1C: No results for input(s): HGBA1C in the last 72  hours. CBG: Recent Labs  Lab 10/17/17 0600 10/17/17 1642 10/17/17 2101 10/18/17 0231 10/18/17 0623  GLUCAP 197* 341* 165* 228* 164*   Lipid Profile: No results for input(s): CHOL, HDL, LDLCALC, TRIG, CHOLHDL, LDLDIRECT in the last 72 hours. Thyroid Function Tests: No results for input(s): TSH, T4TOTAL, FREET4, T3FREE, THYROIDAB in the last 72 hours. Anemia Panel: No results for input(s): VITAMINB12, FOLATE, FERRITIN, TIBC, IRON, RETICCTPCT in the last 72 hours. Urine analysis:    Component Value Date/Time   COLORURINE YELLOW 01/22/2016 1636   APPEARANCEUR TURBID (A) 01/22/2016 1636   LABSPEC 1.015 01/22/2016 1636   PHURINE 7.5 01/22/2016 1636   GLUCOSEU NEGATIVE 01/22/2016 1636   HGBUR SMALL (A) 01/22/2016 1636   BILIRUBINUR NEGATIVE 01/22/2016 1636   KETONESUR NEGATIVE 01/22/2016 1636   PROTEINUR 100 (A) 01/22/2016 1636   UROBILINOGEN 0.2 07/12/2014 0726   NITRITE NEGATIVE 01/22/2016 1636   LEUKOCYTESUR LARGE (A) 01/22/2016 1636   Sepsis Labs: @LABRCNTIP (procalcitonin:4,lacticidven:4)  ) Recent Results (from the past 240 hour(s))  MRSA PCR Screening     Status: None   Collection Time: 10/13/17  5:03 PM  Result Value Ref Range Status   MRSA by PCR NEGATIVE NEGATIVE Final    Comment:        The GeneXpert MRSA Assay (FDA approved for NASAL specimens only), is one component of a comprehensive MRSA colonization surveillance program. It is not intended to diagnose MRSA infection nor to guide or monitor treatment  for MRSA infections. Performed at [facility], [place] [address], [place], [place] [address]       Studies: No results found.  Scheduled Meds: . dorzolamide-timolol  1 drop Left Eye BID  . doxercalciferol  2 mcg Intravenous Q M,W,F-HD  . heparin injection (subcutaneous)  5,000 Units Subcutaneous Q8H  . insulin aspart  0-9 Units Subcutaneous TID WC  . latanoprost  1 drop Left Eye QHS  . mouth rinse  15 mL Mouth Rinse BID  . midodrine  5 mg Oral TID WC  . pantoprazole  40 mg Oral Daily  . sevelamer carbonate  1,600 mg Oral TID WC  . sodium chloride flush  3 mL Intravenous Q12H  . sodium polystyrene  15 g Oral Daily    Continuous Infusions: . sodium chloride    . sodium chloride    . sodium chloride 75 mL/hr at 10/18/17 0818  . piperacillin-tazobactam (ZOSYN)  IV Stopped (10/18/17 0953)  . [START ON 10/20/2017] vancomycin       LOS: 5 days     Kayleen Memos, MD [facility] Pager [phone]  If 7PM-7AM, please contact night-coverage [email or web address] Password [email or web address] 10/18/2017, 11:31 AM

## 2017-10-18 NOTE — Progress Notes (Signed)
Patient drowsy BP around  0200 69/35, Rapid response was on the floor I asked for their help, called [facility] attending obtained order for 1000 ml Bolus and NS at 75 after that. BP responding the last BP at 0230 was 92/39, will continue to monitor.

## 2017-10-19 ENCOUNTER — Inpatient Hospital Stay (HOSPITAL_COMMUNITY): Payer: Medicare Other

## 2017-10-19 ENCOUNTER — Encounter (HOSPITAL_COMMUNITY): Payer: Self-pay | Admitting: Radiology

## 2017-10-19 LAB — RENAL FUNCTION PANEL
Albumin: 2.4 g/dL — ABNORMAL LOW (ref 3.5–5.0)
Anion gap: 12 (ref 5–15)
BUN: 21 mg/dL — ABNORMAL HIGH (ref 6–20)
CHLORIDE: 95 mmol/L — AB (ref 101–111)
CO2: 26 mmol/L (ref 22–32)
CREATININE: 5.72 mg/dL — AB (ref 0.61–1.24)
Calcium: 7.9 mg/dL — ABNORMAL LOW (ref 8.9–10.3)
GFR, EST AFRICAN AMERICAN: 10 mL/min — AB (ref >60)
GFR, EST NON AFRICAN AMERICAN: 9 mL/min — AB (ref >60)
Glucose, Bld: 151 mg/dL — ABNORMAL HIGH (ref 65–99)
Phosphorus: 4.3 mg/dL (ref 2.5–4.6)
Potassium: 3.5 mmol/L (ref 3.5–5.1)
Sodium: 133 mmol/L — ABNORMAL LOW (ref 135–145)

## 2017-10-19 LAB — GLUCOSE, CAPILLARY
GLUCOSE-CAPILLARY: 186 mg/dL — AB (ref 65–99)
Glucose-Capillary: 175 mg/dL — ABNORMAL HIGH (ref 65–99)
Glucose-Capillary: 192 mg/dL — ABNORMAL HIGH (ref 65–99)
Glucose-Capillary: 180 mg/dL — ABNORMAL HIGH (ref 65–99)

## 2017-10-19 LAB — LIPID PANEL
CHOL/HDL RATIO: 2.8 ratio
CHOLESTEROL: 88 mg/dL (ref 0–200)
HDL: 32 mg/dL — ABNORMAL LOW (ref >40)
LDL Cholesterol: 29 mg/dL (ref 0–99)
Triglycerides: 137 mg/dL (ref <150)
VLDL: 27 mg/dL (ref 0–40)

## 2017-10-19 LAB — HEMOGLOBIN A1C
Hgb A1c MFr Bld: 5.6 % (ref 4.8–5.6)
Mean Plasma Glucose: 114.02 mg/dL

## 2017-10-19 MED ORDER — ASPIRIN EC 81 MG PO TBEC
81.0000 mg | DELAYED_RELEASE_TABLET | Freq: Every day | ORAL | Status: DC
  Administered 2017-10-19 – 2017-10-21 (×3): 81 mg via ORAL
  Filled 2017-10-19 (×3): qty 1

## 2017-10-19 MED ORDER — IBUPROFEN 100 MG PO CHEW
100.0000 mg | CHEWABLE_TABLET | Freq: Two times a day (BID) | ORAL | Status: DC | PRN
  Filled 2017-10-19: qty 1

## 2017-10-19 MED ORDER — ATORVASTATIN CALCIUM 10 MG PO TABS
20.0000 mg | ORAL_TABLET | Freq: Every day | ORAL | Status: DC
  Administered 2017-10-19: 20 mg via ORAL
  Filled 2017-10-19: qty 2

## 2017-10-19 MED ORDER — DOXYCYCLINE HYCLATE 100 MG PO TABS
100.0000 mg | ORAL_TABLET | Freq: Two times a day (BID) | ORAL | Status: DC
  Administered 2017-10-19 – 2017-10-21 (×4): 100 mg via ORAL
  Filled 2017-10-19 (×4): qty 1

## 2017-10-19 MED ORDER — ATORVASTATIN CALCIUM 40 MG PO TABS: 40.0000 mg | ORAL_TABLET | Freq: Every day | ORAL | Status: DC

## 2017-10-19 NOTE — Progress Notes (Signed)
OT Cancellation Note  Patient Details Name: Travis Carlson MRN: [id number] DOB: 1945/11/11   Cancelled Treatment:    Reason Eval/Treat Not Completed: Patient at procedure or test/ unavailable(MRI). OT will continue to follow for eval as schedule allows.  [place] 10/19/2017, 10:07 AM  Hulda Humphrey OTR/L [phone]

## 2017-10-19 NOTE — Progress Notes (Addendum)
PROGRESS NOTE  Travis Carlson HYW:[id number] DOB: 09-14-1945 DOA: 10/13/2017 PCP: Parke Poisson, MD  HPI/Recap of past 24 hours: 72 yo male former smoker with hx of ESRD followed at [facility] and [facility] has several admissions for hyperkalemia. He presented to [facility] after fall, and found to have hyperkalemia with bradycardia causing altered mental status. 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 Patient was admitted under the care of pulmonology in the ICU. Transferred to stepdown.  On the evening of 10/16/17 a code stroke was called. Neurology recommended MRI brain which has been ordered and is pending.  10/17/17: patient seen and examined at his bedside. He is alert but confused. States he feels tired post hemodialysis. Denies chest pain or dyspnea. Minimally interactive.  10/18/17: Hypotensive this am. Midodrine started 5 mg TID. reports severe testicular tenderness out of proportion to exam. Scrotal U/S ordered. More alert today but persistent weakness. Will obtain MRI today.  10/19/2017: Reports persistent pain in his scrotum.  Ultrasound of scrotum with Doppler did not reveal torsion.  Will get urology consult.  Will be taken down for MRI brain today.  Has no other complaints.  Assessment/Plan: Principal Problem:   Bradycardia Active Problems:   ESRD (end stage renal disease) on dialysis (HCC)   Generalized weakness   Hyperkalemia  Acute metabolic encephalopathy, resolved -MRI ordered to rule out a stroke -Suspect toxic metabolic encephalopathy possibly related to hyponatremia -Reorient as needed -Fall precaution -Neurochecks every 4 hours -Neurology following.  Highly appreciated. -MRI done today revealed: 1. No acute intracranial abnormality. 2. Moderate chronic small vessel ischemic disease and cerebral atrophy. 3. 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  Suspected occlusion of  the distal right vertebral artery. 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 -management as stated above  Severe scrotal pain with unclear etiology -scrotal U/S ordered with no sign of testicular torsion -no rash, vesicles, or purulence noted -We will get urology consult -possible calciphylaxis vs orchitis -Spoke with Dr. Louis Meckel, urology. Who will see him at the clinic in 2 weeks. -Will start oral antibiotic x 10 days -NSAIDS for pain management, icing, tight fitting underwear  Hyperkalemia,  resolved -Potassium 3.5 from 6.1 from 7.5 from 7.3 -Nephrology following -Dialyzed 10/17/17 and 10/18/2017 -Kayexalate given  Hyponatremia, improving -Na+ 133 from 129 from 126 -In the setting of end-stage renal disease on dialysis -Nephrology following -Repeat BMP in the morning  Elevated troponin -Peaked at 0.13 -Trended down to 0.12 (10/14/2017)  Leukocytosis with no clear source of infection, resolved -WBC 10.3 from 14.1 from 6.4 -Repeat CBC in the morning -Chest x-ray right lower lobe infiltrates versus atelectasis as reviewed by myself -Pro-calcitonin 39.25 from 16.8 -T-max 99.1 last night -blood cultures x2 no growth less than 24 hours -We will treat empirically - IV vancomycin and Zosyn  End-stage renal disease on hemodialysis Monday Wednesday Friday -Continue hemodialysis -Had hemodialysis 10/17/2017 and 10/18/2017 -Nephrology following.  Highly appreciated  Hypomagnesemia -Magnesium 1.9 -nephrology following  GERD -Protonix p.o.   Code Status: Full  Family Communication: Not at bedside  Disposition Plan: Home after urology evaluation  Consultants:  Neurology  CCM  Urology  Procedures:  Hemodialysis 10/17/2017, 10/18/2017  Antimicrobials: IV vancomycin and IV Zosyn day#2 from  10/17/2017 MRSA negative DC IV vancomycin  DVT prophylaxis:  SCDs, subcu heparin 5000 units 3 times daily   Objective: Vitals:   10/19/17 0106 10/19/17 0447 10/19/17 0605 10/19/17 0740  BP: (!) 107/47 (!) 90/56 (!) 114/56 (!) 118/42  Pulse: (!) 54 (!) 51  67  Resp: 20 17  17   Temp: 98.6 F (37 C) 98.4 F (36.9 C)  98.6 F (37 C)  TempSrc: Axillary Oral  Oral  SpO2: 98% 95%  97%  Weight:      Height:        Intake/Output Summary (Last 24 hours) at 10/19/2017 1208 Last data filed at 10/19/2017 0800 Gross per 24 hour  Intake 2158.08 ml  Output 725 ml  Net 1433.08 ml   Filed Weights   10/17/17 1135 10/18/17 1512 10/18/17 1812  Weight: 65.1 kg (143 lb 8.3 oz) 65 kg (143 lb 4.8 oz) 65.3 kg (143 lb 15.4 oz)    Exam:10/19/17 seen and examined. Changes as noted below otherwise physical exam is the same.   General: 72 year old Caucasian male well-developed well-nourished no acute distress alert but confused and minimally interactive  Cardiovascular: Regular rate and rhythm with no rubs or gallops  Respiratory: Clear to auscultation with no wheezes or rale  Abdomen: Normal bowel sounds x4 quadrant; left lower abdomen colostomy bag in place with stool.  musculoskeletal: Appears to move all extremities. Does not attempt to follow commands   Skin: Abrasion noted on the side of left eye  Psychiatry: Unable to assess due to confusion  GU: right testicular severely tender. No erythema, vesicles or purulence noted.   Data Reviewed: CBC: Recent Labs  Lab 10/13/17 1540 10/14/17 0058 10/15/17 0553 10/16/17 2009 10/18/17 0236  WBC 8.4 11.8* 6.4 14.1* 10.3  NEUTROABS 7.6  --   --   --   --   HGB 11.7* 13.3 12.8* 16.5 15.5  HCT 38.1* 41.1 41.6 49.8 46.6  MCV 97.2 94.1 96.5 94.1 93.6  PLT 88* 102* 76* 113* 858*   Basic Metabolic Panel: Recent Labs  Lab 10/14/17 0058  10/17/17 0354 10/17/17 0746 10/17/17 1721 10/18/17 0234 10/19/17 0441  NA 141   < > 128* 128* 131*  129* 133*  K 5.0   < > 7.3* 7.5* 4.9 6.1* 3.5  CL 96*   < > 91* 90* 88* 87* 95*  CO2 30   < > 20* 19* 21* 23 26  GLUCOSE 21*   < > 139* 194* 282* 231* 151*  BUN 15   < > 33* 35* 26* 34* 21*  CREATININE 6.62*   < > 9.03* 9.54* 6.18* 7.02* 5.72*  CALCIUM 9.3   < > 9.9 9.7 9.0 8.5* 7.9*  MG 2.0  --   --  1.9  --   --   --   PHOS  --    < > 7.3* 7.9* 8.4* 9.0* 4.3   < > = values in this interval not displayed.   GFR: Estimated Creatinine Clearance: 10.8 mL/min (A) (by C-G formula based on SCr of 5.72 mg/dL (H)). Liver Function Tests: Recent Labs  Lab 10/13/17 1745  10/16/17 2009 10/17/17 0354 10/17/17 0746 10/17/17 1721 10/18/17 0234 10/19/17 0441  AST 37  --  24  --   --   --   --   --   ALT 18  --  15*  --   --   --   --   --   ALKPHOS 61  --  76  --   --   --   --   --  BILITOT 2.0*  --  3.3*  --   --   --   --   --   PROT 6.1*  --  7.6  --   --   --   --   --   ALBUMIN 3.2*   < > 4.1 3.9 3.7 3.3* 3.2* 2.4*   < > = values in this interval not displayed.   No results for input(s): LIPASE, AMYLASE in the last 168 hours. Recent Labs  Lab 10/16/17 2009  AMMONIA 37*   Coagulation Profile: Recent Labs  Lab 10/16/17 2009  INR 1.66   Cardiac Enzymes: Recent Labs  Lab 10/13/17 1745 10/13/17 2159 10/14/17 0058 10/14/17 1124  TROPONINI 0.07* 0.09* 0.13* 0.12*   BNP (last 3 results) No results for input(s): PROBNP in the last 8760 hours. HbA1C: No results for input(s): HGBA1C in the last 72 hours. CBG: Recent Labs  Lab 10/18/17 1135 10/18/17 1911 10/18/17 2132 10/19/17 0633 10/19/17 1126  GLUCAP 187* 105* 154* 186* 175*   Lipid Profile: No results for input(s): CHOL, HDL, LDLCALC, TRIG, CHOLHDL, LDLDIRECT in the last 72 hours. Thyroid Function Tests: No results for input(s): TSH, T4TOTAL, FREET4, T3FREE, THYROIDAB in the last 72 hours. Anemia Panel: No results for input(s): VITAMINB12, FOLATE, FERRITIN, TIBC, IRON, RETICCTPCT in the last 72 hours. Urine  analysis:    Component Value Date/Time   COLORURINE YELLOW 01/22/2016 1636   APPEARANCEUR TURBID (A) 01/22/2016 1636   LABSPEC 1.015 01/22/2016 1636   PHURINE 7.5 01/22/2016 1636   GLUCOSEU NEGATIVE 01/22/2016 1636   HGBUR SMALL (A) 01/22/2016 1636   BILIRUBINUR NEGATIVE 01/22/2016 1636   KETONESUR NEGATIVE 01/22/2016 1636   PROTEINUR 100 (A) 01/22/2016 1636   UROBILINOGEN 0.2 07/12/2014 0726   NITRITE NEGATIVE 01/22/2016 1636   LEUKOCYTESUR LARGE (A) 01/22/2016 1636   Sepsis Labs: @LABRCNTIP (procalcitonin:4,lacticidven:4)  ) Recent Results (from the past 240 hour(s))  MRSA PCR Screening     Status: None   Collection Time: 10/13/17  5:03 PM  Result Value Ref Range Status   MRSA by PCR NEGATIVE NEGATIVE Final    Comment:        The GeneXpert MRSA Assay (FDA approved for NASAL specimens only), is one component of a comprehensive MRSA colonization surveillance program. It is not intended to diagnose MRSA infection nor to guide or monitor treatment for MRSA infections. Performed at [facility], [place] [address], [place], [place] [address]   Culture, blood (routine x 2)     Status: None (Preliminary result)   Collection Time: 10/17/17  5:18 PM  Result Value Ref Range Status   Specimen Description BLOOD LEFT ANTECUBITAL  Final   Special Requests   Final    BOTTLES DRAWN AEROBIC ONLY Blood Culture adequate volume   Culture   Final    NO GROWTH < 24 HOURS Performed at [facility], [place] [address] [place], [place] [address]    Report Status PENDING  Incomplete  Culture, blood (routine x 2)     Status: None (Preliminary result)   Collection Time: 10/17/17  5:24 PM  Result Value Ref Range Status   Specimen Description BLOOD LEFT HAND  Final   Special Requests IN PEDIATRIC BOTTLE Blood Culture adequate volume  Final   Culture   Final    NO GROWTH < 24 HOURS Performed at [facility], [place] [address] [place], [place] [address]    Report Status PENDING   Incomplete  Studies: Mr Brain 52 Contrast  Result Date: 10/19/2017 CLINICAL DATA:  Altered mental status with right-sided facial droop and right-sided weakness. EXAM: MRI HEAD WITHOUT CONTRAST TECHNIQUE: Multiplanar, multiecho pulse sequences of the brain and surrounding structures were obtained without intravenous contrast. COMPARISON:  Head CT 10/16/2017 FINDINGS: Brain: There is no evidence of acute infarct, intracranial hemorrhage, mass, midline shift, or extra-axial fluid collection. There is moderate, central predominant cerebral atrophy. Patchy T2 hyperintensities in the subcortical and deep cerebral white matter are nonspecific but compatible with moderate chronic small vessel ischemic disease. 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 Punctate T2 hyperintensities in the basal ganglia bilaterally are attributed to perivascular spaces. Vascular: Loss of the normal flow void in the distal V3 and V4 segments of the right vertebral artery. Other major intracranial vascular flow voids are preserved. Skull and upper cervical spine: Unremarkable bone marrow signal. Sinuses/Orbits: Postoperative changes to the globes. Small right globe. Minimal scattered mucosal thickening in the paranasal sinuses. Clear mastoid air cells. Other: None. IMPRESSION: 1. No acute intracranial abnormality. 2. Moderate chronic small vessel ischemic disease and cerebral atrophy. 3. 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 Electronically Signed   By: Logan Bores M.D.   On: 10/19/2017 11:01   US Scrotum W/doppler  Result Date: 10/18/2017 CLINICAL DATA:  Scrotal pain for 2 days. EXAM: SCROTAL ULTRASOUND DOPPLER ULTRASOUND OF THE TESTICLES TECHNIQUE: Complete ultrasound examination of the testicles, epididymis, and other scrotal structures was performed. Color and spectral Doppler ultrasound were also utilized to evaluate blood flow to the testicles. COMPARISON:  None. FINDINGS: Right testicle  Measurements: 3.0 x 2.0 x 2.5 cm. No mass or microlithiasis visualized. Left testicle Measurements: 3.5 x 3.2 x 2.6 cm. No discrete mass. Heterogeneous echogenicity. No microlithiasis. Right epididymis: Normal in size. 5 mm epididymal head cyst. Otherwise unremarkable. Left epididymis:  Normal in size and appearance. Hydrocele:  None visualized. Varicocele:  Bilateral varicoceles. Pulsed Doppler interrogation of both testes demonstrates normal low resistance arterial and venous waveforms bilaterally. IMPRESSION: 1. Left testicle has heterogeneous echogenicity which is of unclear etiology. There is no evidence of torsion. There is no increased blood flow to suggest orchitis. There is no discrete mass. 2. Normal appearance of the right testicle. 3. Bilateral varicoceles.  No other abnormalities. Electronically Signed   By: Lajean Manes M.D.   On: 10/18/2017 20:23    Scheduled Meds: . dorzolamide-timolol  1 drop Left Eye BID  . doxercalciferol  2 mcg Intravenous Q M,W,F-HD  . heparin injection (subcutaneous)  5,000 Units Subcutaneous Q8H  . insulin aspart  0-9 Units Subcutaneous TID WC  . latanoprost  1 drop Left Eye QHS  . mouth rinse  15 mL Mouth Rinse BID  . midodrine  5 mg Oral TID WC  . pantoprazole  40 mg Oral Daily  . patiromer  8.4 g Oral Daily  . sevelamer carbonate  1,600 mg Oral TID WC    Continuous Infusions: . sodium chloride 10 mL/hr at 10/18/17 1424  . piperacillin-tazobactam (ZOSYN)  IV Stopped (10/19/17 1006)  . vancomycin Stopped (10/18/17 1900)     LOS: 6 days     Kayleen Memos, MD [facility] Pager [phone]  If 7PM-7AM, please contact night-coverage [email or web address] Password [email or web address] 10/19/2017, 12:08 PM

## 2017-10-19 NOTE — Consult Note (Signed)
Consult for testicular pain.  U/S r/o torsion, no mass.  Some heterogeneity of left testicle.  Treat for orchitis.  Abx x 10 days, NSAIDs, ICE and scrotal support (tight fitting underwear).  F/u with Urology in 2 weeks.

## 2017-10-19 NOTE — Progress Notes (Signed)
[name]  Dialysis prescription MWF [name] (lives in [place]) [name] but usu hosp at [employer] [employer] 4 hours EDW 70 will have lower EDW at discharge probably around 65-66 kg 450/A1.5 2K2.5 Ca Heparin 2000 bolus Hectorol 2 mcg No ESA or iron  Assessment/Recommendations 1. ESRD - MWF HD. Adm 10/13/17 K>8.5 w/profound bradycardia.  Had urgent HD 3/4, 3/5, 3/6 for recurrent ^K. Shuntogram/PTA of high grade in stent stenosis so thought access may have been part of the issue but K 7.3 on 3/8, had full TMT 1K bath, then 6.1 on Saturday and had another 1K bath 3 hour TMT. Was on daily kayexalate as well (which was on at home with similar problems at his own unit and similar admits to [facility]) 1. K today looks fine for the first time (5 TMT's last week, last 2 on 1K bath entirely) 2. Stopped kayexalate, added daily patiromer yesterday 3. Regular HD tomorrow 2. Leukocytosis - possible RLL infilt vs atelectasis. Procalcitonin elev. On empiric vanco and zosyn. 3. Hypotension - ? 2/2 sepsis. Dry on exam on Saturday. Way under EDW. Treated with fluid boluses. Stable now. 4. Secondary HPT - hectorol with HD 5. Encephalopathy - recurrent issue on/off this adm. Neuro following. 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 He was living alone and driving PTA. Appears to me would be better served in SNF.  6. H/o colectomy w/colostomy 7. Whipple's procedure 1996 8. DM2  Travis Maes, MD [facility] [phone] pager 10/18/2017, 1:19 PM    Subjective/Interval History:  Had 5th HD TMT of the week yesterday Today is first post HD day that K was not elevated Had AVG PTA on 3/8 and thought that was the issue, then up again yesterday Have started daily patiromer Dry yesterday and responded to fluid boluses for hypotension which has resolved More alert today  Objective Vital signs in last 24 hours: Vitals:   10/19/17 0605 10/19/17 0740 10/19/17 1220 10/19/17  1400  BP: (!) 114/56 (!) 118/42 (!) 121/43   Pulse:  67 (!) 54   Resp:  17 14   Temp:  98.6 F (37 C) 98.7 F (37.1 C) 98.8 F (37.1 C)  TempSrc:  Oral Oral Oral  SpO2:  97% 95%   Weight:      Height:       Weight change: 0 kg (0 lb)  Intake/Output Summary (Last 24 hours) at 10/19/2017 1458 Last data filed at 10/19/2017 0800 Gross per 24 hour  Intake 2158.08 ml  Output 350 ml  Net 1808.08 ml   Physical Exam:  Blood pressure (!) 121/43, pulse (!) 54, temperature 98.8 F (37.1 C), temperature source Oral, resp. rate 14, height 5\' 9"  (1.753 m), weight 65.3 kg (143 lb 15.4 oz), SpO2 95 %.   Older WM Disheveled in appearance VS as noted Lungs grossly clear S1S2 No S3 Abd colostomy in place liquid stool No abd tenderness No LE edema RUE AVG + bruit Neuro oriented X3 but very slow  Recent Labs  Lab 10/14/17 1124 10/15/17 0553 10/16/17 2009 10/17/17 0354 10/17/17 0746 10/17/17 1721 10/18/17 0234 10/19/17 0441  NA 137 134* 126* 128* 128* 131* 129* 133*  K 6.7* 5.4* >7.5* 7.3* 7.5* 4.9 6.1* 3.5  CL 94* 98* 90* 91* 90* 88* 87* 95*  CO2 31 26 20* 20* 19* 21* 23 26  GLUCOSE 113* 104* 193* 139* 194* 282* 231* 151*  BUN 20 12 26* 33* 35* 26* 34* 21*  CREATININE 7.61* 6.15* 7.97* 9.03* 9.54* 6.18* 7.02* 5.72*  CALCIUM 8.9 9.0 10.0 9.9 9.7 9.0 8.5* 7.9*  PHOS 6.2* 5.4*  --  7.3* 7.9* 8.4* 9.0* 4.3    Recent Labs  Lab 10/13/17 1745  10/16/17 2009  10/17/17 1721 10/18/17 0234 10/19/17 0441  AST 37  --  24  --   --   --   --   ALT 18  --  15*  --   --   --   --   ALKPHOS 61  --  76  --   --   --   --   BILITOT 2.0*  --  3.3*  --   --   --   --   PROT 6.1*  --  7.6  --   --   --   --   ALBUMIN 3.2*   < > 4.1   < > 3.3* 3.2* 2.4*   < > = values in this interval not displayed.    Recent Labs  Lab 10/13/17 1540 10/14/17 0058 10/15/17 0553 10/16/17 2009 10/18/17 0236  WBC 8.4 11.8* 6.4 14.1* 10.3  NEUTROABS 7.6  --   --   --   --   HGB 11.7* 13.3 12.8* 16.5 15.5   HCT 38.1* 41.1 41.6 49.8 46.6  MCV 97.2 94.1 96.5 94.1 93.6  PLT 88* 102* 76* 113* 131*    Recent Labs  Lab 10/13/17 1745 10/13/17 2159 10/14/17 0058 10/14/17 1124  TROPONINI 0.07* 0.09* 0.13* 0.12*   CBG: Recent Labs  Lab 10/18/17 1135 10/18/17 1911 10/18/17 2132 10/19/17 0633 10/19/17 1126  GLUCAP 187* 105* 154* 186* 175*   Medications: . sodium chloride 10 mL/hr at 10/18/17 1424  . piperacillin-tazobactam (ZOSYN)  IV Stopped (10/19/17 1006)   . aspirin EC  81 mg Oral Daily  . atorvastatin  20 mg Oral q1800  . dorzolamide-timolol  1 drop Left Eye BID  . doxercalciferol  2 mcg Intravenous Q M,W,F-HD  . heparin injection (subcutaneous)  5,000 Units Subcutaneous Q8H  . insulin aspart  0-9 Units Subcutaneous TID WC  . latanoprost  1 drop Left Eye QHS  . mouth rinse  15 mL Mouth Rinse BID  . midodrine  5 mg Oral TID WC  . pantoprazole  40 mg Oral Daily  . patiromer  8.4 g Oral Daily  . sevelamer carbonate  1,600 mg Oral TID WC

## 2017-10-20 ENCOUNTER — Encounter (HOSPITAL_COMMUNITY): Payer: Self-pay | Admitting: Physician Assistant

## 2017-10-20 DIAGNOSIS — E871 Hypo-osmolality and hyponatremia: Secondary | ICD-10-CM

## 2017-10-20 DIAGNOSIS — N452 Orchitis: Secondary | ICD-10-CM

## 2017-10-20 LAB — RENAL FUNCTION PANEL
ALBUMIN: 2.4 g/dL — AB (ref 3.5–5.0)
ANION GAP: 15 (ref 5–15)
BUN: 28 mg/dL — AB (ref 6–20)
CHLORIDE: 91 mmol/L — AB (ref 101–111)
CO2: 24 mmol/L (ref 22–32)
Calcium: 8 mg/dL — ABNORMAL LOW (ref 8.9–10.3)
Creatinine, Ser: 9.01 mg/dL — ABNORMAL HIGH (ref 0.61–1.24)
GFR calc Af Amer: 6 mL/min — ABNORMAL LOW (ref >60)
GFR calc non Af Amer: 5 mL/min — ABNORMAL LOW (ref >60)
GLUCOSE: 224 mg/dL — AB (ref 65–99)
PHOSPHORUS: 4.2 mg/dL (ref 2.5–4.6)
POTASSIUM: 3.8 mmol/L (ref 3.5–5.1)
Sodium: 130 mmol/L — ABNORMAL LOW (ref 135–145)

## 2017-10-20 LAB — GLUCOSE, CAPILLARY
GLUCOSE-CAPILLARY: 191 mg/dL — AB (ref 65–99)
GLUCOSE-CAPILLARY: 177 mg/dL — AB (ref 65–99)
Glucose-Capillary: 233 mg/dL — ABNORMAL HIGH (ref 65–99)
Glucose-Capillary: 120 mg/dL — ABNORMAL HIGH (ref 65–99)

## 2017-10-20 MED ORDER — MIDODRINE HCL 5 MG PO TABS
ORAL_TABLET | ORAL | Status: AC
  Filled 2017-10-20: qty 2

## 2017-10-20 MED ORDER — CALCIUM CARBONATE ANTACID 500 MG PO CHEW
2.0000 | CHEWABLE_TABLET | Freq: Once | ORAL | Status: AC
  Administered 2017-10-20: 400 mg via ORAL

## 2017-10-20 MED ORDER — ATORVASTATIN CALCIUM 10 MG PO TABS
10.0000 mg | ORAL_TABLET | Freq: Every day | ORAL | Status: DC
  Administered 2017-10-20: 10 mg via ORAL
  Filled 2017-10-20: qty 1

## 2017-10-20 MED ORDER — DOXERCALCIFEROL 4 MCG/2ML IV SOLN
INTRAVENOUS | Status: AC
  Filled 2017-10-20: qty 2

## 2017-10-20 MED ORDER — CALCIUM CARBONATE ANTACID 500 MG PO CHEW
CHEWABLE_TABLET | ORAL | Status: AC
  Filled 2017-10-20: qty 2

## 2017-10-20 NOTE — Progress Notes (Signed)
CSW met with patient to discuss discharge plan. CSW explained recommendation for patient to receive rehabilitation at discharge; patient is refusing. Patient would like to go home. CSW asked about home health services and equipment. Per patient, he would be agreeable to home health services if they came out to see him after he had been home for a few days; he wants to be home and settled first before they come out. Patient says he has no equipment needs at this time.  CSW alerted RNCM. CSW signing off.  Laveda Abbe, [place] Clinical Social Worker [phone]

## 2017-10-20 NOTE — Progress Notes (Signed)
OT Cancellation Note  Patient Details Name: Travis Carlson MRN: [id number] DOB: 02-May-1946   Cancelled Treatment:    Reason Eval/Treat Not Completed: Patient at procedure or test/ unavailable. Pt at HD, OT will re attempt at next available time as appropriate  Britt Bottom 10/20/2017, 12:35 PM

## 2017-10-20 NOTE — Procedures (Signed)
Patient was seen on dialysis and the procedure was supervised.  BFR 400  Via AVG BP is  134/39.   Patient appears to be tolerating treatment well  Travis Carlson A 10/20/2017

## 2017-10-20 NOTE — Consult Note (Addendum)
Cardiology Consultation:   Patient ID: Travis Carlson; [id number]; 11-08-1945   Admit date: 10/13/2017 Date of Consult: 10/20/2017  Primary Care Provider: Parke Poisson, MD Primary Cardiologist: new - Dr. Sallyanne Kuster Primary Electrophysiologist:     Patient Profile:   Travis Carlson is a 72 y.o. male with a hx of ESRD on HD, DM, CAD (stent in the 1980s), s/p thoracic aortic endovascular stent (2015) s/p colectomy and ostomy, and prostate cancer s/p TURP (2015), s/p whipple (1996) who is being seen today for the evaluation of bradycardia at the request of Dr. Nevada Crane.  History of Present Illness:   Travis Carlson has a complicated past medical history (as above). He suffered a fall on 10/13/17 and was brought to [facility]. He required external pacing en route tot he ED. On arrival, he was found to be bradycardic requiring transcutaneous pacing and hyperkalemic with K > 8.5. He was temporized in the ED and admitted for immediate HD. He was also encephalopathic on arrival, which improved after HD. MRI brain without acute process. He had a recent hospitalization at [facility] 2/22 for hyperkalemia 8.4. He reportedly takes daily kayexalate.   His bradycardia improved as K started to improve. However, he was again noted to be bradycardic on 10/20/17. Cardiology consulted. EKG with sinus bradycardia and frequent PVCs. On my interview in HD, his baseline heart rate is in the 50-60s. His fall consisted of him waking up from sleep feeling weak. It sounds as though he rolled out of bed onto the floor. He denies palpitations, dizziness, lightheadedness, and syncope. He did not lose consciousness and did not hit his head. He denies chest pain, shortness of breath, lower extremity swelling, and orthopnea. Per the patient, this is his 3rd admission for hyperkalemia. He is compliant on all medications at home.    Past Medical History:  Diagnosis Date  . Arthritis   . 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 Cancer ([place])    "low  grade"  . CKD (chronic kidney disease)   . DM2 (diabetes mellitus, type 2) ([place])   . ESRD (end stage renal disease) ([place])   . History of benign colon tumor     Past Surgical History:  Procedure Laterality Date  . AV FISTULA PLACEMENT Right 01/30/2016   Procedure: Brachial vein transposistion first stage, right ;  Surgeon: Conrad Richfield, MD;  Location: [place];  Service: Vascular;  Laterality: Right;  . AV FISTULA PLACEMENT Right 05/21/2016   Procedure: INSERTION OF 4-7MM X 45CM ARTERIOVENOUS (AV) GORE-TEX GRAFT ARM RIGHT UPPER ARM;  Surgeon: Conrad Pulaski, MD;  Location: [place];  Service: Vascular;  Laterality: Right;  . COLECTOMY    . COLONOSCOPY    . CYSTOSCOPY    . EYE SURGERY Left    cararact with lens  . HIP ARTHROPLASTY Right 07/14/2014   Procedure: ARTHROPLASTY BIPOLAR HIP;  Surgeon: Rozanna Box, MD;  Location: [place];  Service: Orthopedics;  Laterality: Right;  . IR AV DIALY SHUNT INTRO NEEDLE/INTRACATH INITIAL W/PTA/IMG RIGHT Right 10/16/2017  . LIGATION OF ARTERIOVENOUS  FISTULA Right 05/21/2016   Procedure: LIGATION OF BRACHIAL VEIN TRANSPOSITION UPPER ARM;  Surgeon: Conrad [place], MD;  Location: [place];  Service: Vascular;  Laterality: Right;  . THORACIC AORTIC ENDOVASCULAR STENT GRAFT N/A 07/12/2014   Procedure: THORACIC AORTIC ENDOVASCULAR STENT GRAFT;  Surgeon: Serafina Mitchell, MD;  Location: [place];  Service: Vascular;  Laterality: N/A;  . WHIPPLE PROCEDURE  [phone]  Home Medications:  Prior to Admission medications   Medication Sig Start Date End Date Taking? Authorizing Provider  dorzolamide-timolol (COSOPT) 22.3-6.8 MG/ML ophthalmic solution Place 1 drop into the left eye 2 (two) times daily. 04/19/14   [provider]  ferric citrate (AURYXIA) 1 GM 210 MG(Fe) tablet Take 210-630 mg by mouth See admin instructions. Take three tablets (630 mg) by mouth three times daily with meals and one tablet (210 mg) with snacks    [provider]  gabapentin (NEURONTIN)  300 MG capsule Take 300 mg by mouth at bedtime.    [provider]  HYDROcodone-acetaminophen (NORCO/VICODIN) 5-325 MG tablet Take 1-2 tablets by mouth every 6 (six) hours as needed for moderate pain. Patient taking differently: Take 1-2 tablets by mouth every 8 (eight) hours as needed (pain).  05/21/16   Alvia Grove, PA-C  hydrOXYzine (ATARAX/VISTARIL) 10 MG tablet Take 10 mg by mouth at bedtime as needed (insomnia).  09/09/17   [provider]  insulin glargine (LANTUS) 100 unit/mL SOPN Inject into the skin daily.    [provider]  insulin lispro (HUMALOG) 100 UNIT/ML injection Inject 0-15 Units into the skin 3 (three) times daily with meals. Per sliding scale    [provider]  latanoprost (XALATAN) 0.005 % ophthalmic solution Place 1 drop into the left eye at bedtime.    [provider]  loperamide (IMODIUM) 2 MG capsule Take 2 mg by mouth 4 (four) times daily. 09/09/17   [provider]  Magnesium Oxide 400 (240 Mg) MG TABS Take 800 mg by mouth 2 (two) times daily. 08/16/17   [provider]  midodrine (PROAMATINE) 5 MG tablet Take 5 mg by mouth See admin instructions. Take one tablet (5 mg) by mouth three times week - 30 minutes before starting dialysis on Monday, Wednesday, Friday    [provider]  multivitamin (RENA-VIT) TABS tablet Take 1 tablet by mouth at bedtime. 01/31/16   Lavina Hamman, MD  oxybutynin (DITROPAN) 5 MG tablet Take 5 mg by mouth every 8 (eight) hours as needed for bladder spasms.  03/15/16   [provider]  pantoprazole (PROTONIX) 40 MG tablet Take 40 mg by mouth daily. 09/09/17   [provider]  PRESCRIPTION MEDICATION     [provider]  sevelamer carbonate (RENVELA) 800 MG tablet Take 2 tablets (1,600 mg total) by mouth 3 (three) times daily with meals. 01/31/16   Lavina Hamman, MD  sodium bicarbonate 650 MG tablet Take 1,300 mg by mouth 3 (three) times daily.     [provider]  sodium polystyrene (KAYEXALATE) powder Take 30 g by mouth See admin instructions. Take 30 grams by mouth three times week - Tuesday, Thursday, Saturday (non-dialysis days) 09/09/17   [provider]  sucroferric oxyhydroxide (VELPHORO) 500 MG chewable tablet Chew by mouth.    [provider]    Inpatient Medications: Scheduled Meds: . calcium carbonate      . doxercalciferol      . midodrine      . aspirin EC  81 mg Oral Daily  . atorvastatin  10 mg Oral q1800  . dorzolamide-timolol  1 drop Left Eye BID  . doxercalciferol  2 mcg Intravenous Q M,W,F-HD  . doxycycline  100 mg Oral Q12H  . heparin injection (subcutaneous)  5,000 Units Subcutaneous Q8H  . insulin aspart  0-9 Units Subcutaneous TID WC  . latanoprost  1 drop Left Eye QHS  . mouth rinse  15 mL Mouth Rinse BID  . midodrine  5 mg Oral TID WC  . pantoprazole  40 mg Oral Daily  . patiromer  8.4 g Oral Daily  . sevelamer carbonate  1,600 mg Oral TID WC   Continuous Infusions: . sodium chloride 10 mL/hr at 10/20/17 0057   PRN Meds: diphenhydrAMINE, ibuprofen  Allergies:    Allergies  Allergen Reactions  . Bactrim [Sulfamethoxazole-Trimethoprim] Other (See Comments)    States muscle weakness for 2 days - unable to stand "it like to have killed me"   . Lisinopril Other (See Comments)    Weakness  renal function abnormalities  . Metformin Anaphylaxis    GI Upset (intolerance) "it like to have killed me I couldn't get out of my chair for three days"    Social History:   Social History   Socioeconomic History  . Marital status: Single    Spouse name: Not on file  . Number of children: Not on file  . Years of education: Not on file  . Highest education level: Not on file  Social Needs  . Financial resource strain: Not on file  . Food insecurity - worry: Not on file  . Food insecurity - inability: Not on file  . Transportation needs - medical: Not on file  .  Transportation needs - non-medical: Not on file  Occupational History  . Not on file  Tobacco Use  . Smoking status: Former Smoker    Years: 40.00    Types: Cigarettes    Last attempt to quit: 03/28/2006    Years since quitting: 11.5  . Smokeless tobacco: Never Used  . Tobacco comment: " was a light smoker."  Substance and Sexual Activity  . Alcohol use: No  . Drug use: No  . Sexual activity: Not on file  Other Topics Concern  . Not on file  Social History Narrative  . Not on file    Family History:   Family History  Problem Relation Age of Onset  . Hypertension Father      ROS:  Please see the history of present illness.   All other ROS reviewed and negative.     Physical Exam/Data:   Vitals:   10/20/17 1200 10/20/17 1230 10/20/17 1300 10/20/17 1330  BP: (!) 131/58 (!) 114/43 (!) 114/43 (!) 114/44  Pulse: (!) 50 (!) 51 (!) 50 (!) 55  Resp: 14 14 16 14   Temp: 98 F (36.7 C)     TempSrc: Oral     SpO2: 98%     Weight:      Height:        Intake/Output Summary (Last 24 hours) at 10/20/2017 1354 Last data filed at 10/20/2017 0631 Gross per 24 hour  Intake 350 ml  Output 2750 ml  Net -2400 ml   Filed Weights   10/18/17 1512 10/18/17 1812 10/19/17 2002  Weight: 143 lb 4.8 oz (65 kg) 143 lb 15.4 oz (65.3 kg) 151 lb 3.8 oz (68.6 kg)   Body mass index is 22.33 kg/m.  General:  Well nourished, well developed, in no acute distress HEENT: normal Neck: no JVD Vascular: No carotid bruits Cardiac:  normal S1, S2; regular rhythm, bradycardic rate; no murmur Lungs:  clear to auscultation bilaterally, no wheezing, rhonchi or rales  Abd: soft, nontender, no hepatomegaly  Ext: no edema Musculoskeletal:  No deformities, BUE and BLE strength normal and equal Skin: warm and dry  Neuro:  CNs 2-12 intact, no focal abnormalities noted Psych:  Normal affect   EKG:  The EKG was personally reviewed and demonstrates:  Sinus bradycardia, RBBB, frequent PVCs Telemetry:   Telemetry was personally reviewed and demonstrates:  Sinus with PVCs, HR has not dropped below 40 by my review  Relevant CV Studies:  Echo 01/26/16: Study Conclusions - Left ventricle: The cavity size was normal. Systolic function was   normal. The estimated ejection fraction was in the range of 60%   to 65%. Wall motion was normal; there were no regional wall   motion abnormalities. Left ventricular diastolic function   parameters were normal. - Atrial septum: No defect or patent foramen ovale was identified.   Laboratory Data:  Chemistry Recent Labs  Lab 10/18/17 0234 10/19/17 0441 10/20/17 1219  NA 129* 133* 130*  K 6.1* 3.5 3.8  CL 87* 95* 91*  CO2 23 26 24   GLUCOSE 231* 151* 224*  BUN 34* 21* 28*  CREATININE 7.02* 5.72* 9.01*  CALCIUM 8.5* 7.9* 8.0*  GFRNONAA 7* 9* 5*  GFRAA 8* 10* 6*  ANIONGAP 19* 12 15    Recent Labs  Lab 10/13/17 1745  10/16/17 2009  10/18/17 0234 10/19/17 0441 10/20/17 1219  PROT 6.1*  --  7.6  --   --   --   --   ALBUMIN 3.2*   < > 4.1   < > 3.2* 2.4* 2.4*  AST 37  --  24  --   --   --   --   ALT 18  --  15*  --   --   --   --   ALKPHOS 61  --  76  --   --   --   --   BILITOT 2.0*  --  3.3*  --   --   --   --    < > = values in this interval not displayed.   Hematology Recent Labs  Lab 10/15/17 0553 10/16/17 2009 10/18/17 0236  WBC 6.4 14.1* 10.3  RBC 4.31 5.29 4.98  HGB 12.8* 16.5 15.5  HCT 41.6 49.8 46.6  MCV 96.5 94.1 93.6  MCH 29.7 31.2 31.1  MCHC 30.8 33.1 33.3  RDW 19.3* 18.5* 18.6*  PLT 76* 113* 131*   Cardiac Enzymes Recent Labs  Lab 10/13/17 1745 10/13/17 2159 10/14/17 0058 10/14/17 1124  TROPONINI 0.07* 0.09* 0.13* 0.12*   No results for input(s): TROPIPOC in the last 168 hours.  BNPNo results for input(s): BNP, PROBNP in the last 168 hours.  DDimer No results for input(s): DDIMER in the last 168 hours.  Radiology/Studies:  Mr Herby Abraham Contrast  Result Date: 10/19/2017 CLINICAL DATA:  Altered mental  status with right-sided facial droop and right-sided weakness. EXAM: MRI HEAD WITHOUT CONTRAST TECHNIQUE: Multiplanar, multiecho pulse sequences of the brain and surrounding structures were obtained without intravenous contrast. COMPARISON:  Head CT 10/16/2017 FINDINGS: Brain: There is no evidence of acute infarct, intracranial hemorrhage, mass, midline shift, or extra-axial fluid collection. There is moderate, central predominant cerebral atrophy. Patchy T2 hyperintensities in the subcortical and deep cerebral white matter are nonspecific but compatible with moderate chronic small vessel ischemic disease. 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 Punctate T2 hyperintensities in the basal ganglia bilaterally are attributed to perivascular spaces. Vascular: Loss of the normal flow void in the distal V3 and V4 segments of the right vertebral artery. Other major intracranial vascular flow voids are preserved. Skull and upper cervical spine: Unremarkable bone marrow signal. Sinuses/Orbits: Postoperative changes to the  globes. Small right globe. Minimal scattered mucosal thickening in the paranasal sinuses. Clear mastoid air cells. Other: None. IMPRESSION: 1. No acute intracranial abnormality. 2. Moderate chronic small vessel ischemic disease and cerebral atrophy. 3. 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 Electronically Signed   By: Logan Bores M.D.   On: 10/19/2017 11:01   Dg Chest Port 1 View  Result Date: 10/16/2017 CLINICAL DATA:  Altered level of consciousness EXAM: PORTABLE CHEST 1 VIEW COMPARISON:  10/13/2017, 01/22/2016 FINDINGS: Stent in the right upper extremity. Aortic vascular stent similar compared to prior. Airspace disease at the left base. No pleural effusion. Stable cardiomediastinal silhouette. No pneumothorax. IMPRESSION: 1. Airspace disease at the left lung base may reflect a pneumonia. 2. Otherwise no significant interval change compared to prior. Electronically  Signed   By: Donavan Foil M.D.   On: 10/16/2017 20:34   US Scrotum W/doppler  Result Date: 10/18/2017 CLINICAL DATA:  Scrotal pain for 2 days. EXAM: SCROTAL ULTRASOUND DOPPLER ULTRASOUND OF THE TESTICLES TECHNIQUE: Complete ultrasound examination of the testicles, epididymis, and other scrotal structures was performed. Color and spectral Doppler ultrasound were also utilized to evaluate blood flow to the testicles. COMPARISON:  None. FINDINGS: Right testicle Measurements: 3.0 x 2.0 x 2.5 cm. No mass or microlithiasis visualized. Left testicle Measurements: 3.5 x 3.2 x 2.6 cm. No discrete mass. Heterogeneous echogenicity. No microlithiasis. Right epididymis: Normal in size. 5 mm epididymal head cyst. Otherwise unremarkable. Left epididymis:  Normal in size and appearance. Hydrocele:  None visualized. Varicocele:  Bilateral varicoceles. Pulsed Doppler interrogation of both testes demonstrates normal low resistance arterial and venous waveforms bilaterally. IMPRESSION: 1. Left testicle has heterogeneous echogenicity which is of unclear etiology. There is no evidence of torsion. There is no increased blood flow to suggest orchitis. There is no discrete mass. 2. Normal appearance of the right testicle. 3. Bilateral varicoceles.  No other abnormalities. Electronically Signed   By: Lajean Manes M.D.   On: 10/18/2017 20:23   Ct Head Code Stroke Wo Contrast  Result Date: 10/16/2017 CLINICAL DATA:  Code stroke. 72 y/o M; right facial droop and right-sided weakness. EXAM: CT HEAD WITHOUT CONTRAST TECHNIQUE: Contiguous axial images were obtained from the base of the skull through the vertex without intravenous contrast. COMPARISON:  10/13/2017 CT head. FINDINGS: Brain: No evidence of acute infarction, hemorrhage, hydrocephalus, extra-axial collection or mass lesion/mass effect. Small chronic lacunar infarct in right thalamus. Stable chronic microvascular ischemic changes and parenchymal volume loss of the brain.  Vascular: Calcific atherosclerosis of carotid siphons and vertebral arteries. Contrast is present within the vascular system. Skull: Normal. Negative for fracture or focal lesion. Sinuses/Orbits: Stable small right globe with increased density. Normal aeration of visualized paranasal sinuses and mastoid air cells. Other: None. ASPECTS [facility] Stroke Program Early CT Score) - Ganglionic level infarction (caudate, lentiform nuclei, internal capsule, insula, M1-M3 cortex): 7 - Supraganglionic infarction (M4-M6 cortex): 3 Total score (0-10 with 10 being normal): 10 IMPRESSION: 1. No acute intracranial abnormality identified. 2. ASPECTS is 10 3. Stable chronic microvascular ischemic changes and parenchymal volume loss of the brain. These results were communicated to Dr. Rory Percy at 7:44 pmon 3/7/2019by text page via the [facility] messaging system. Electronically Signed   By: Kristine Garbe M.D.   On: 10/16/2017 19:44    Assessment and Plan:   1. Bradycardia EKG with sinus bradycardia with RBBB and frequent PVCs. RBBB is noted on EKG in 2017. He was also noted to be bradycardic at [facility] during his  last visit in 09/2017 when he was admitted for hyperkalemia. Per the patient, his baseline heart rate is in the 60s. It sounds as though his bradycardia is related to his hyperkalemic state. Would not recommend pacemaker in a dialysis patient. Would recommend aggressive potassium control. Of note, he is on timolol eye drops which may cause bradycardia. Will defer management of eye drops to    2. CAD s/p stent in 1980s Pt denies anginal symptoms. Have low suspicion for an ACS process at this time.    3. Thoracic aortic endovascular stent (2015) Stable.   For questions or updates, please contact [place] Please consult [email or web address] for contact info under Cardiology/STEMI.   Signed, Ledora Bottcher, PA  10/20/2017 1:54 PM   I have seen and examined the patient along with Ledora Bottcher, PA .   I have reviewed the chart, notes and new data.  I agree with PA's note.  Key new complaints: He feels tired, but denies dizziness or syncope or shortness of breath. Key examination changes: Appears chronically ill, mild bradycardia with heart rate in the 50s and occasional ectopic beats, mildly pale, colostomy; no overt hypervolemia Key new findings / data: Creatinine increased from 5.7 to 9.1 in just 24 hours, potassium 3.8 today  PLAN: I do not think he meets criteria for pacemaker implantation.  Significant and symptomatic bradycardia only occured in association with severe hyperkalemia.  Currently his heart rate is appropriate for his activity level. Avoid negative chronotropic agents.  Currently the only such medication he is receiving is a low dose of timolol and his glaucoma eyedrops.  It might be possible to switch to a non-beta-blocker formulation. The odds of harming him with a pacemaker are higher than the benefit he could receive. Ultimately, if his hyperkalemia is severe enough he will die with or without a pacemaker. In addition, there are likely to be problems with hemodialysis access and increased risk of bleed/generator infection with hemodialysis.   I would only consider pacemaker implantation if he has evidence of significant and symptomatic bradycardia when his potassium is normal.  Sanda Klein, MD, [place] [phone] 10/20/2017, 4:18 PM

## 2017-10-20 NOTE — Progress Notes (Signed)
PROGRESS NOTE  Travis Carlson FXT:[id number] DOB: [birth year] DOA: 10/13/2017 PCP: Parke Poisson, MD  HPI/Recap of past 24 hours: 72 yo male former smoker with hx of ESRD followed at [facility] and [facility] has several admissions for hyperkalemia. He presented to [facility] after fall, and found to have hyperkalemia with bradycardia causing altered mental status. 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 Patient was admitted under the care of pulmonology in the ICU. Transferred to stepdown.  On the evening of 10/16/17 a code stroke was called. Neurology recommended MRI brain which has been ordered and is pending.  10/17/17: patient seen and examined at his bedside. He is alert but confused. States he feels tired post hemodialysis. Denies chest pain or dyspnea. Minimally interactive.  10/18/17: Hypotensive this am. Midodrine started 5 mg TID. reports severe testicular tenderness out of proportion to exam. Scrotal U/S ordered. More alert today but persistent weakness. Will obtain MRI today.  10/19/2017: Reports persistent pain in his scrotum.  Ultrasound of scrotum with Doppler did not reveal torsion.  Will get urology consult.  Will be taken down for MRI brain today.  Has no other complaints.  10/20/2017: No acute events reported overnight.  Persistent scrotal pain.  On p.o. antibiotics and NSAIDs.  Bradycardic this morning with heart rate in the low 40s not on beta-blocker or calcium channel blocker.  Cardiology consulted.  Patient denies any chest pain dyspnea or palpitations.  No dizziness.  Assessment/Plan: Principal Problem:   Bradycardia Active Problems:   ESRD (end stage renal disease) on dialysis (HCC)   Generalized weakness   Hyperkalemia  Acute metabolic encephalopathy, resolved -Suspect toxic metabolic encephalopathy possibly related to hyponatremia -Reorient as needed if recurs -Fall precaution -Neurochecks  every 4 hours -MRI done  10/19/2017 revealed: 1. No acute intracranial abnormality. 2. Moderate chronic small vessel ischemic disease and cerebral atrophy. 3. 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 antibiotics, NSAIDs, ice at the scrotum and 2 weeks follow-up -Spoke with Dr. Louis Meckel, urology on the phone on 10/19/2017. Who will see him at the clinic in 2 weeks. -Continue p.o. doxycycline day 1 x 10 days -Continue NSAIDS for pain management, icing, tight fitting underwear, as recommended by urology  Hyperkalemia,  resolved -Potassium 3.5 from 6.1 from 7.5 from 7.3 -Nephrology following -Dialyzed 10/17/17 and 10/18/2017 -Kayexalate given  Hyponatremia, improving -Na+ 133 from 129 from 126 -In the setting of end-stage renal disease on dialysis -Nephrology following -Repeat BMP in the morning  Elevated troponin -Peaked at 0.13 -Trended down to 0.12 (10/14/2017)  Leukocytosis with no  clear source of  infection, resolved -WBC 10.3 from 14.1 from 6.4 -Repeat CBC in the morning -Chest x-ray right lower lobe infiltrates versus atelectasis as reviewed by myself -Pro-calcitonin 39.25 from 16.8 -T-max 99.1 last night -blood cultures x2 no growth less than 24 hours -Treated empirically with IV vancomycin and IV Zosyn -No fevers or leukocytosis in the last 48 hours -DC IV antibiotics  End-stage renal disease on hemodialysis Monday Wednesday Friday -Continue hemodialysis -Had hemodialysis 10/17/2017 and 10/18/2017 -Possible hemodialysis today 10/20/2017 -Nephrology following.  Highly appreciated  Hypomagnesemia -Magnesium 1.9 -nephrology following  GERD -Protonix p.o.   Code Status: Full  Family Communication: Not at bedside  Disposition Plan: Home after urology evaluation  Consultants:  Neurology  CCM  Urology  Procedures:  Hemodialysis 10/17/2017, 10/18/2017  Antimicrobials: Completed 4 days of IV vancomycin and IV Zosyn empirically  MRSA negative DC IV vancomycin  DVT prophylaxis:  SCDs, subcu heparin 5000 units 3 times daily   Objective: Vitals:   10/19/17 2002 10/20/17 0002 10/20/17 0402 10/20/17 0848  BP: (!) 110/53 (!) 117/58 124/76 (!) 128/48  Pulse: (!) 42 (!) 50 (!) 51 (!) 51  Resp: 16 12 15 14   Temp: 98.1 F (36.7 C) 98 F (36.7 C) 97.8 F (36.6 C) 98.2 F (36.8 C)  TempSrc: Oral Oral Oral Oral  SpO2: 93% 96% 93% 98%  Weight: 68.6 kg (151 lb 3.8 oz)     Height:        Intake/Output Summary (Last 24 hours) at 10/20/2017 1103 Last data filed at 10/20/2017 0631 Gross per 24 hour  Intake 350 ml  Output 2750 ml  Net -2400 ml   Filed Weights   10/18/17 1512 10/18/17 1812 10/19/17 2002  Weight: 65 kg (143 lb 4.8 oz) 65.3 kg (143 lb 15.4 oz) 68.6 kg (151 lb 3.8 oz)    Exam:10/20/17 seen and examined. Changes as noted below otherwise physical exam is the same.   General: 72 year old Caucasian male well-developed well-nourished no acute distress alert  and oriented x3  Cardiovascular: Slow rate and rhythm with no rubs or gallops  Respiratory: Clear to auscultation with no wheezes or rale  Abdomen: Normal bowel sounds x4 quadrant; left lower abdomen colostomy bag in place with stool.  musculoskeletal: Appears to move all extremities. Does not attempt to follow commands   Skin: Abrasion noted on the side of left eye  Psychiatry: mood is appropriate for condition and setting  GU: right testicular severely tender. No erythema, vesicles or purulence noted.   Data Reviewed: CBC: Recent Labs  Lab 10/13/17 1540 10/14/17 0058 10/15/17 0553 10/16/17 2009 10/18/17 0236  WBC 8.4 11.8* 6.4 14.1* 10.3  NEUTROABS 7.6  --   --   --   --   HGB 11.7* 13.3 12.8* 16.5 15.5  HCT 38.1* 41.1 41.6 49.8 46.6  MCV 97.2 94.1 96.5 94.1 93.6  PLT 88* 102* 76* 113* 409*   Basic Metabolic Panel: Recent Labs  Lab 10/14/17 0058  10/17/17 0354 10/17/17 0746 10/17/17 1721 10/18/17 0234 10/19/17 0441  NA 141   < > 128* 128* 131* 129* 133*  K 5.0   < > 7.3* 7.5* 4.9 6.1* 3.5  CL 96*   < > 91* 90* 88* 87* 95*  CO2 30   < > 20* 19* 21* 23 26  GLUCOSE 21*   < > 139* 194* 282* 231* 151*  BUN 15   < > 33* 35* 26* 34* 21*  CREATININE 6.62*   < > 9.03*  9.54* 6.18* 7.02* 5.72*  CALCIUM 9.3   < > 9.9 9.7 9.0 8.5* 7.9*  MG 2.0  --   --  1.9  --   --   --   PHOS  --    < > 7.3* 7.9* 8.4* 9.0* 4.3   < > = values in this interval not displayed.   GFR: Estimated Creatinine Clearance: 11.3 mL/min (A) (by C-G formula based on SCr of 5.72 mg/dL (H)). Liver Function Tests: Recent Labs  Lab 10/13/17 1745  10/16/17 2009 10/17/17 0354 10/17/17 0746 10/17/17 1721 10/18/17 0234 10/19/17 0441  AST 37  --  24  --   --   --   --   --   ALT 18  --  15*  --   --   --   --   --   ALKPHOS 61  --  76  --   --   --   --   --   BILITOT 2.0*  --  3.3*  --   --   --   --   --   PROT 6.1*  --  7.6  --   --   --   --   --   ALBUMIN 3.2*   < > 4.1 3.9 3.7 3.3* 3.2*  2.4*   < > = values in this interval not displayed.   No results for input(s): LIPASE, AMYLASE in the last 168 hours. Recent Labs  Lab 10/16/17 2009  AMMONIA 37*   Coagulation Profile: Recent Labs  Lab 10/16/17 2009  INR 1.66   Cardiac Enzymes: Recent Labs  Lab 10/13/17 1745 10/13/17 2159 10/14/17 0058 10/14/17 1124  TROPONINI 0.07* 0.09* 0.13* 0.12*   BNP (last 3 results) No results for input(s): PROBNP in the last 8760 hours. HbA1C: Recent Labs    10/19/17 1234  HGBA1C 5.6   CBG: Recent Labs  Lab 10/19/17 1126 10/19/17 1609 10/19/17 2209 10/20/17 0635 10/20/17 0849  GLUCAP 175* 192* 180* 191* 233*   Lipid Profile: Recent Labs    10/19/17 1234  CHOL 88  HDL 32*  LDLCALC 29  TRIG 137  CHOLHDL 2.8   Thyroid Function Tests: No results for input(s): TSH, T4TOTAL, FREET4, T3FREE, THYROIDAB in the last 72 hours. Anemia Panel: No results for input(s): VITAMINB12, FOLATE, FERRITIN, TIBC, IRON, RETICCTPCT in the last 72 hours. Urine analysis:    Component Value Date/Time   COLORURINE YELLOW 01/22/2016 1636   APPEARANCEUR TURBID (A) 01/22/2016 1636   LABSPEC 1.015 01/22/2016 1636   PHURINE 7.5 01/22/2016 1636   GLUCOSEU NEGATIVE 01/22/2016 1636   HGBUR SMALL (A) 01/22/2016 1636   BILIRUBINUR NEGATIVE 01/22/2016 1636   KETONESUR NEGATIVE 01/22/2016 1636   PROTEINUR 100 (A) 01/22/2016 1636   UROBILINOGEN 0.2 07/12/2014 0726   NITRITE NEGATIVE 01/22/2016 1636   LEUKOCYTESUR LARGE (A) 01/22/2016 1636   Sepsis Labs: @LABRCNTIP (procalcitonin:4,lacticidven:4)  ) Recent Results (from the past 240 hour(s))  MRSA PCR Screening     Status: None   Collection Time: 10/13/17  5:03 PM  Result Value Ref Range Status   MRSA by PCR NEGATIVE NEGATIVE Final    Comment:        The GeneXpert MRSA Assay (FDA approved for NASAL specimens only), is one component of a comprehensive MRSA colonization surveillance program. It is not intended to diagnose  MRSA infection nor to guide or monitor treatment for MRSA infections. Performed at [facility], [place] [address] [place], [place] [address]  Culture, blood (routine x 2)     Status: None (Preliminary result)   Collection Time: 10/17/17  5:18 PM  Result Value Ref Range Status   Specimen Description BLOOD LEFT ANTECUBITAL  Final   Special Requests   Final    BOTTLES DRAWN AEROBIC ONLY Blood Culture adequate volume   Culture   Final    NO GROWTH 2 DAYS Performed at [facility], [address] [place], [place] [address]    Report Status PENDING  Incomplete  Culture, blood (routine x 2)     Status: None (Preliminary result)   Collection Time: 10/17/17  5:24 PM  Result Value Ref Range Status   Specimen Description BLOOD LEFT HAND  Final   Special Requests IN PEDIATRIC BOTTLE Blood Culture adequate volume  Final   Culture   Final    NO GROWTH 2 DAYS Performed at [facility], [place] [address] [place], [place] [address]    Report Status PENDING  Incomplete      Studies: No results found.  Scheduled Meds: . aspirin EC  81 mg Oral Daily  . atorvastatin  10 mg Oral q1800  . dorzolamide-timolol  1 drop Left Eye BID  . doxercalciferol  2 mcg Intravenous Q M,W,F-HD  . doxycycline  100 mg Oral Q12H  . heparin injection (subcutaneous)  5,000 Units Subcutaneous Q8H  . insulin aspart  0-9 Units Subcutaneous TID WC  . latanoprost  1 drop Left Eye QHS  . mouth rinse  15 mL Mouth Rinse BID  . midodrine  5 mg Oral TID WC  . pantoprazole  40 mg Oral Daily  . patiromer  8.4 g Oral Daily  . sevelamer carbonate  1,600 mg Oral TID WC    Continuous Infusions: . sodium chloride 10 mL/hr at 10/20/17 0057  . piperacillin-tazobactam (ZOSYN)  IV 3.375 g (10/20/17 0631)     LOS: 7 days     Kayleen Memos, MD [facility] Pager [phone]  If 7PM-7AM, please contact night-coverage [email or web address] Password [facility] 10/20/2017, 11:03 AM

## 2017-10-20 NOTE — Progress Notes (Signed)
PT Cancellation Note  Patient Details Name: Travis Carlson MRN: [id number] DOB: 1945/10/02   Cancelled Treatment:    Reason Eval/Treat Not Completed: (P) Patient at procedure or test/unavailable Pt is in hemodialysis. PT will check back tomorrow for treatment.   Rorey Bisson B. Migdalia Dk PT, DPT Acute Rehabilitation  [phone] Pager [phone]     [place] 10/20/2017, 3:00 PM

## 2017-10-20 NOTE — Care Management Note (Signed)
Case Management Note  Patient Details  Name: Travis Carlson MRN: [id number] Date of Birth: 10/28/1945  Subjective/Objective:                    Action/Plan: Per CSW patient is not interested in any rehab and wants to go home. Pt was agreeable to some [place] services. MD please order [facility] for patient at d/c if agreeable.  CM following.  Expected Discharge Date:                  Expected Discharge Plan:  [name]  In-House Referral:     Discharge planning Services  CM Consult  Post Acute Care Choice:    Choice offered to:     DME Arranged:    DME Agency:     HH Arranged:    [place] Agency:     Status of Service:  In process, will continue to follow  If discussed at Long Length of Stay Meetings, dates discussed:    Additional Comments:  Pollie Friar, RN 10/20/2017, 11:38 AM

## 2017-10-20 NOTE — Progress Notes (Signed)
[name]  Dialysis prescription MWF [place] (lives in [place]) [place] but usu hosp at [employer] [employer] 4 hours EDW 70 will have lower EDW at discharge probably around 65-66 kg 450/A1.5 2K2.5 Ca Heparin 2000 bolus Hectorol 2 mcg No ESA or iron  Assessment/Recommendations 1. ESRD - MWF HD. Adm 10/13/17 K>8.5 w/profound bradycardia.  Had urgent HD 3/4, 3/5, 3/6 for recurrent ^K. Shuntogram/PTA of high grade in stent stenosis so thought access may have been part of the issue but K 7.3 on 3/8, had full TMT 1K bath, then 6.1 on Saturday and had another 1K bath 3 hour TMT. Was on daily kayexalate as well (which was on at home with similar problems at his own unit and similar admits to [facility]) 1. K today looks fine for the first time (5 TMT's last week, last 2 on 1K bath entirely) 2.  added daily patiromer yesterday 3. Regular HD tomorrow-- check pre HD K 2. Leukocytosis - possible RLL infilt vs atelectasis. Procalcitonin elev. On empiric vanco and zosyn. 3. Hypotension - ? 2/2 sepsis. Dry on exam on Saturday. Way under EDW. Treated with fluid boluses. Stable now. Is on midodrine 4. Secondary HPT - hectorol with HD 5. Encephalopathy - recurrent issue on/off this adm. Neuro following. 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 He was living alone and driving PTA. Appears to me would be better served in SNF.  6. H/o colectomy w/colostomy 7. Whipple's procedure 1996 8. DM2 9. Anemia- not an issue 10. Bones- phos is good/better on renvela 1600 TID- cont hectorol   Alizee Maple A 10/18/2017, 1:19 PM    Subjective/Interval History:  yest was first post HD day that K was not elevated Had AVG PTA on 3/8 and thought that was the issue, then up again yesterday Have started daily patiromer Dry previously responded to fluid boluses for hypotension which has resolved   Objective Vital signs in last 24 hours: Vitals:   10/19/17 2002 10/20/17 0002 10/20/17 0402  10/20/17 0848  BP: (!) 110/53 (!) 117/58 124/76 (!) 128/48  Pulse: (!) 42 (!) 50 (!) 51 (!) 51  Resp: 16 12 15 14   Temp: 98.1 F (36.7 C) 98 F (36.7 C) 97.8 F (36.6 C) 98.2 F (36.8 C)  TempSrc: Oral Oral Oral Oral  SpO2: 93% 96% 93% 98%  Weight: 68.6 kg (151 lb 3.8 oz)     Height:       Weight change: 3.6 kg (7 lb 15 oz)  Intake/Output Summary (Last 24 hours) at 10/20/2017 1014 Last data filed at 10/20/2017 0631 Gross per 24 hour  Intake 350 ml  Output 2750 ml  Net -2400 ml   Physical Exam:  Blood pressure (!) 128/48, pulse (!) 51, temperature 98.2 F (36.8 C), temperature source Oral, resp. rate 14, height 5\' 9"  (1.753 m), weight 68.6 kg (151 lb 3.8 oz), SpO2 98 %.   Older WM Disheveled in appearance VS as noted Lungs grossly clear S1S2 No S3 Abd colostomy in place liquid stool No abd tenderness No LE edema RUE AVG + bruit Neuro oriented X3 but very slow  Recent Labs  Lab 10/14/17 1124 10/15/17 0553 10/16/17 2009 10/17/17 0354 10/17/17 0746 10/17/17 1721 10/18/17 0234 10/19/17 0441  NA 137 134* 126* 128* 128* 131* 129* 133*  K 6.7* 5.4* >7.5* 7.3* 7.5* 4.9 6.1* 3.5  CL 94* 98* 90* 91* 90* 88* 87* 95*  CO2 31 26 20* 20* 19* 21* 23 26  GLUCOSE  113* 104* 193* 139* 194* 282* 231* 151*  BUN 20 12 26* 33* 35* 26* 34* 21*  CREATININE 7.61* 6.15* 7.97* 9.03* 9.54* 6.18* 7.02* 5.72*  CALCIUM 8.9 9.0 10.0 9.9 9.7 9.0 8.5* 7.9*  PHOS 6.2* 5.4*  --  7.3* 7.9* 8.4* 9.0* 4.3    Recent Labs  Lab 10/13/17 1745  10/16/17 2009  10/17/17 1721 10/18/17 0234 10/19/17 0441  AST 37  --  24  --   --   --   --   ALT 18  --  15*  --   --   --   --   ALKPHOS 61  --  76  --   --   --   --   BILITOT 2.0*  --  3.3*  --   --   --   --   PROT 6.1*  --  7.6  --   --   --   --   ALBUMIN 3.2*   < > 4.1   < > 3.3* 3.2* 2.4*   < > = values in this interval not displayed.    Recent Labs  Lab 10/13/17 1540 10/14/17 0058 10/15/17 0553 10/16/17 2009 10/18/17 0236  WBC 8.4  11.8* 6.4 14.1* 10.3  NEUTROABS 7.6  --   --   --   --   HGB 11.7* 13.3 12.8* 16.5 15.5  HCT 38.1* 41.1 41.6 49.8 46.6  MCV 97.2 94.1 96.5 94.1 93.6  PLT 88* 102* 76* 113* 131*    Recent Labs  Lab 10/13/17 1745 10/13/17 2159 10/14/17 0058 10/14/17 1124  TROPONINI 0.07* 0.09* 0.13* 0.12*   CBG: Recent Labs  Lab 10/19/17 1126 10/19/17 1609 10/19/17 2209 10/20/17 0635 10/20/17 0849  GLUCAP 175* 192* 180* 191* 233*   Medications: . sodium chloride 10 mL/hr at 10/20/17 0057  . piperacillin-tazobactam (ZOSYN)  IV 3.375 g (10/20/17 0631)   . aspirin EC  81 mg Oral Daily  . atorvastatin  10 mg Oral q1800  . dorzolamide-timolol  1 drop Left Eye BID  . doxercalciferol  2 mcg Intravenous Q M,W,F-HD  . doxycycline  100 mg Oral Q12H  . heparin injection (subcutaneous)  5,000 Units Subcutaneous Q8H  . insulin aspart  0-9 Units Subcutaneous TID WC  . latanoprost  1 drop Left Eye QHS  . mouth rinse  15 mL Mouth Rinse BID  . midodrine  5 mg Oral TID WC  . pantoprazole  40 mg Oral Daily  . patiromer  8.4 g Oral Daily  . sevelamer carbonate  1,600 mg Oral TID WC

## 2017-10-21 DIAGNOSIS — R262 Difficulty in walking, not elsewhere classified: Secondary | ICD-10-CM

## 2017-10-21 LAB — GLUCOSE, CAPILLARY
Glucose-Capillary: 157 mg/dL — ABNORMAL HIGH (ref 65–99)
Glucose-Capillary: 159 mg/dL — ABNORMAL HIGH (ref 65–99)

## 2017-10-21 MED ORDER — PATIROMER SORBITEX CALCIUM 8.4 G PO PACK: 8.4000 g | PACK | Freq: Every day | ORAL | 0 refills | Status: DC

## 2017-10-21 MED ORDER — MIDODRINE HCL 5 MG PO TABS: 5.0000 mg | ORAL_TABLET | Freq: Three times a day (TID) | ORAL | 0 refills | Status: AC

## 2017-10-21 MED ORDER — ASPIRIN 81 MG PO TBEC: 81.0000 mg | DELAYED_RELEASE_TABLET | Freq: Every day | ORAL | 0 refills | Status: AC

## 2017-10-21 MED ORDER — SODIUM POLYSTYRENE SULFONATE PO POWD: 15.0000 g | Freq: Every day | ORAL | 0 refills | Status: AC

## 2017-10-21 MED ORDER — ATORVASTATIN CALCIUM 10 MG PO TABS: 10.0000 mg | ORAL_TABLET | Freq: Every day | ORAL | 0 refills | Status: AC

## 2017-10-21 MED ORDER — DOXYCYCLINE HYCLATE 100 MG PO TABS: 100.0000 mg | ORAL_TABLET | Freq: Two times a day (BID) | ORAL | 0 refills | Status: AC

## 2017-10-21 NOTE — Progress Notes (Signed)
Physical Therapy Treatment Patient Details Name: Travis Carlson MRN: [id number] DOB: [birth year] Today's Date: 10/21/2017    History of Present Illness 72 yo male former smoker with hx of ESRD followed at [facility] and [facility] has several admissions for hyperkalemia. He presented to [facility] after fall, and found to have hyperkalemia with bradycardia causing altered mental status. 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     PT Comments    Pt progressing towards functional mobility goals. Pt ambulating min guard with SPC. Pt would be more safer and more stable with RW, but pt refuses to use anything but his SPC at home. Pt Believe pt would benefit from CIR services and discussed reasoning for professional opinion for CIR with him. Pt remains adamant about going home at this time.Pt lives alone but says that he has very good friends near by that can help him if he needs it. Pt agreed to have HHPT see him, but is concerned about scheduling PT around his MWF dialysis. HHPT would be very beneficial to ensure that pt is able to take care of himself and perform functional mobility tasks safely within his home. Will continue to follow acutely and progress as tolerated.    Follow Up Recommendations  CIR     Equipment Recommendations  None recommended by PT    Recommendations for Other Services       Precautions / Restrictions Precautions Precautions: Fall Precaution Comments: ostomy Restrictions Weight Bearing Restrictions: No    Mobility  Bed Mobility Overal bed mobility: Needs Assistance Bed Mobility: Supine to Sit     Supine to sit: Supervision;HOB elevated     General bed mobility comments: supervision for safety, increased time required to complete task. able to scoot to EOB without assist.  Transfers Overall  transfer level: Needs assistance Equipment used: Rolling walker (2 wheeled) Transfers: Sit to/from Stand Sit to Stand: Min guard         General transfer comment: pt requires 2 attempts to achieve standing, requires increased time to complete task, min guard for safety, VCs for hand placement  Ambulation/Gait Ambulation/Gait assistance: Min guard Ambulation Distance (Feet): 250 Feet Assistive device: Rolling walker (2 wheeled);Straight cane Gait Pattern/deviations: Step-through pattern;Decreased stride length;Trunk flexed Gait velocity: decreased Gait velocity interpretation: Below normal speed for age/gender General Gait Details: pt initially with RW but adamant about using cane at home. trial with SPC min guard for safety. some slight instability noted but no overt LOB. pt required a few gait cycles to figure out proper advancement of cane each time he stopped but able to self correct without VCs. pt unable to multitask and walk-he completely stops walking to answer questions. VCs for increased cadence.   Stairs Stairs: Yes   Stair Management: One rail Right;With cane;Step to pattern Number of Stairs: 2 General stair comments: therapy stairs. able to manage cane with min guard for safety. of note pt does not have rail on step at home.  Wheelchair Mobility    Modified Rankin (Stroke Patients Only)       Balance Overall balance assessment: Needs assistance Sitting-balance support: Feet supported Sitting balance-Leahy Scale: Good     Standing balance support: No upper extremity supported Standing balance-Leahy Scale: Fair Standing balance comment: able to perform unsupported static standing  Cognition Arousal/Alertness: Awake/alert Behavior During Therapy: Flat affect Overall Cognitive Status: Within Functional Limits for tasks assessed                                 General Comments: appears cognitively  appropriate. may have some decreased awareness of safety and precautions or may just be stubborn.      Exercises      General Comments General comments (skin integrity, edema, etc.): irregular HR rhythm but otherwise VSS throughout BP 135/61 in sitting, 133/51 at end of session HR 49-80 throughout and responds appropriately to activity     Pertinent Vitals/Pain Pain Assessment: No/denies pain    Home Living                      Prior Function            PT Goals (current goals can now be found in the care plan section) Acute Rehab PT Goals Patient Stated Goal: to go home and see his dog PT Goal Formulation: With patient Time For Goal Achievement: 10/30/17 Potential to Achieve Goals: Good Progress towards PT goals: Progressing toward goals    Frequency    Min 3X/week      PT Plan Current plan remains appropriate    Co-evaluation              AM-PAC PT "6 Clicks" Daily Activity  Outcome Measure  Difficulty turning over in bed (including adjusting bedclothes, sheets and blankets)?: A Little Difficulty moving from lying on back to sitting on the side of the bed? : A Little Difficulty sitting down on and standing up from a chair with arms (e.g., wheelchair, bedside commode, etc,.)?: A Little Help needed moving to and from a bed to chair (including a wheelchair)?: A Little Help needed walking in hospital room?: A Little Help needed climbing 3-5 steps with a railing? : A Little 6 Click Score: 18    End of Session Equipment Utilized During Treatment: Gait belt Activity Tolerance: Patient tolerated treatment well Patient left: in chair;with call bell/phone within reach;with chair alarm set Nurse Communication: Mobility status PT Visit Diagnosis: Unsteadiness on feet (R26.81);Other abnormalities of gait and mobility (R26.89);Muscle weakness (generalized) (M62.81)     Time: 9794-8016 PT Time Calculation (min) (ACUTE ONLY): 24 min  Charges:  $Gait  Training: 8-22 mins $Self Care/Home Management: 8-22                    G Codes:       Vic Ripper, SPT   Vic Ripper 10/21/2017, 12:02 PM

## 2017-10-21 NOTE — Progress Notes (Signed)
Pt discharge education and instructions completed with pt at bedside; pt voices understanding and denies any questions. Pt IV and telemetry removed; pt discharge home with family to transport him home. Pt handed his prescriptions and will pick up the electroncially sent one from preferred pharmacy on file. Pt awaiting on his ride home and will be transported off unit via wheelchair with belongings. P. Amo Kashae Carstens. RN

## 2017-10-21 NOTE — Progress Notes (Signed)
[name]  Dialysis prescription MWF [place] (lives in [place]) [place] but usu hosp at [facility] 4 hours EDW 70 will have lower EDW at discharge probably around 65-66 kg 450/A1.5 2K2.5 Ca Heparin 2000 bolus Hectorol 2 mcg No ESA or iron  Assessment/Recommendations 1. ESRD - MWF HD. Adm 10/13/17 K>8.5 w/profound bradycardia.  Had urgent HD 3/4, 3/5, 3/6 for recurrent ^K. Shuntogram/PTA of high grade in stent stenosis-but K 7.3 on 3/8, had full TMT 1K bath, then 6.1 on Saturday and had another 1K bath 3 hour TMT.  1. K today looks fine - back on schedule 2.  added daily patiromer yesterday 3. Regular HD tomorrow-- check pre HD K 2. Leukocytosis - possible RLL infilt vs atelectasis. Procalcitonin elev. On empiric vanco and zosyn. 3. Hypotension - ? 2/2 sepsis. Dry on exam on Saturday. Way under EDW.Stable now. Is on midodrine 4. Secondary HPT - hectorol with HD 5. Encephalopathy - recurrent issue on/off this adm. Neuro following. 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 He was living alone and driving PTA. Appears to me would be better served in SNF.  6. H/o colectomy w/colostomy 7. Whipple's procedure 1996 8. DM2 9. Anemia- not an issue 10. Bones- phos is good/better on renvela 1600 TID- cont hectorol   Nayana Lenig A 10/18/2017, 1:19 PM    Subjective/Interval History:  HD went well yest- post weight 65.6- tolerated well He would like to go home today - not sure what plan is  Will order HD first thing in AM if still here tomorrow    Objective Vital signs in last 24 hours: Vitals:   10/20/17 2033 10/21/17 0023 10/21/17 0510 10/21/17 0825  BP: 140/63 (!) 105/40 (!) 121/54 (!) 172/58  Pulse: (!) 55 (!) 57 (!) 46 (!) 54  Resp: 14 16 12 15   Temp: 98 F (36.7 C) 98 F (36.7 C) 98 F (36.7 C) 98.3 F (36.8 C)  TempSrc: Oral Oral Oral Oral  SpO2: 97% 95% 95% 98%  Weight:   65.6 kg (144 lb 10 oz)   Height:       Weight change:  -0.1 kg (-3.5 oz)  Intake/Output Summary (Last 24 hours) at 10/21/2017 0925 Last data filed at 10/21/2017 0600 Gross per 24 hour  Intake -  Output 2600 ml  Net -2600 ml   Physical Exam:  Blood pressure (!) 172/58, pulse (!) 54, temperature 98.3 F (36.8 C), temperature source Oral, resp. rate 15, height 5\' 9"  (1.753 m), weight 65.6 kg (144 lb 10 oz), SpO2 98 %.   Older WM Disheveled in appearance VS as noted Lungs grossly clear S1S2 No S3 Abd colostomy in place liquid stool No abd tenderness No LE edema RUE AVG + bruit Neuro oriented X3 but very slow  Recent Labs  Lab 10/15/17 0553 10/16/17 2009 10/17/17 0354 10/17/17 0746 10/17/17 1721 10/18/17 0234 10/19/17 0441 10/20/17 1219  NA 134* 126* 128* 128* 131* 129* 133* 130*  K 5.4* >7.5* 7.3* 7.5* 4.9 6.1* 3.5 3.8  CL 98* 90* 91* 90* 88* 87* 95* 91*  CO2 26 20* 20* 19* 21* 23 26 24   GLUCOSE 104* 193* 139* 194* 282* 231* 151* 224*  BUN 12 26* 33* 35* 26* 34* 21* 28*  CREATININE 6.15* 7.97* 9.03* 9.54* 6.18* 7.02* 5.72* 9.01*  CALCIUM 9.0 10.0 9.9 9.7 9.0 8.5* 7.9* 8.0*  PHOS 5.4*  --  7.3* 7.9* 8.4* 9.0* 4.3 4.2    Recent Labs  Lab  10/16/17 2009  10/18/17 0234 10/19/17 0441 10/20/17 1219  AST 24  --   --   --   --   ALT 15*  --   --   --   --   ALKPHOS 76  --   --   --   --   BILITOT 3.3*  --   --   --   --   PROT 7.6  --   --   --   --   ALBUMIN 4.1   < > 3.2* 2.4* 2.4*   < > = values in this interval not displayed.    Recent Labs  Lab 10/15/17 0553 10/16/17 2009 10/18/17 0236  WBC 6.4 14.1* 10.3  HGB 12.8* 16.5 15.5  HCT 41.6 49.8 46.6  MCV 96.5 94.1 93.6  PLT 76* 113* 131*    Recent Labs  Lab 10/14/17 1124  TROPONINI 0.12*   CBG: Recent Labs  Lab 10/20/17 0635 10/20/17 0849 10/20/17 1654 10/20/17 2155 10/21/17 0628  GLUCAP 191* 233* 120* 177* 157*   Medications: . sodium chloride 10 mL/hr at 10/20/17 0057   . aspirin EC  81 mg Oral Daily  . atorvastatin  10 mg Oral q1800  .  dorzolamide-timolol  1 drop Left Eye BID  . doxercalciferol  2 mcg Intravenous Q M,W,F-HD  . doxycycline  100 mg Oral Q12H  . heparin injection (subcutaneous)  5,000 Units Subcutaneous Q8H  . insulin aspart  0-9 Units Subcutaneous TID WC  . latanoprost  1 drop Left Eye QHS  . mouth rinse  15 mL Mouth Rinse BID  . midodrine  5 mg Oral TID WC  . pantoprazole  40 mg Oral Daily  . patiromer  8.4 g Oral Daily  . sevelamer carbonate  1,600 mg Oral TID WC

## 2017-10-21 NOTE — Discharge Instructions (Signed)
Dialysis Diet Dialysis is a treatment that cleans your blood. It is used when the kidneys are damaged. When you need dialysis, you should watch your diet. This is because some nutrients can build up in your blood between treatments and make you sick. These nutrients are:  Potassium.  Phosphorus.  Sodium.  Your doctor or dietitian will:  Tell you how much of these you can have.  Tell you if you need to look out for other nutrients too.  Help you plan meals.  Tell you how much to drink each day.  What do I need to know about this diet?  Limit potassium. Potassium is in milk, fruits, and vegetables.  Limit phosphorus. Phosphorus is in milk, cheese, beans, nuts, and carbonated beverages.  Limit salt (sodium). Foods that have a lot sodium include processed and cured meats, ready-made frozen meals, canned vegetables, and salty snack foods.  Do not use salt substitutes.  Try not to eat whole-grain foods and foods that have a lot of fiber.  Follow your doctor's instructions about how much to drink. You may be told to: ? Write down what you drink. ? Write down foods you eat that are made mostly from water, such as gelatin and soups. ? Drink from small cups.  Ask your doctor if you should take a medicine that binds phosphorus.  Take vitamin and mineral supplements only as told by your doctor.  Eat meat, poultry, fish, and eggs. Limit nuts and beans.  Before you cook potatoes, cut them into small pieces. Then boil them in unsalted water.  Drain all fluid from cooked vegetables and canned fruits before you eat them. What foods can I eat? Grains White bread. White rice. Cooked cereal. Unsalted popcorn. Tortillas. Pasta. Vegetables Fresh or frozen broccoli, carrots, and green beans. Cabbage. Cauliflower. Celery. Cucumbers. Eggplant. Radishes. Zucchini. Fruits Apples. Fresh or frozen berries. Fresh or canned pears, peaches, and pineapple. Grapes. Plums. Meats and Other Protein  Sources Fresh or frozen beef, pork, chicken, and fish. Eggs. Low-sodium canned tuna or salmon. Dairy Cream cheese. Heavy cream. Ricotta cheese. Beverages Apple cider. Cranberry juice. Grape juice. Lemonade. Black coffee. Condiments Herbs. Spices. Jam and jelly. Honey. Sweets and Desserts Sherbet. Cakes. Cookies. Fats and Oils Olive oil, canola oil, and safflower oil. Other Non-dairy creamer. Non-dairy whipped topping. Homemade broth without salt. The items listed above may not be a complete list of recommended foods or beverages. Contact your dietitian for more options. What foods are not recommended? Grains Whole-grain bread. Whole-grain pasta. High-fiber cereal. Vegetables Potatoes. Beets. Tomatoes. Winter squash and pumpkin. Asparagus. Spinach. Parsnips. Fruits Star fruit. Bananas. Oranges. Kiwi. Nectarines. Prunes. Melon. Dried fruit. Avocado. Meats and Other Protein Sources Canned, smoked, and cured meats. Soil scientist. Sardines. Nuts and seeds. Peanut butter. Beans and legumes. Dairy Milk. Buttermilk. Yogurt. Cheese and cottage cheese. Processed cheese spreads. Beverages Orange juice. Prune juice. Carbonated soft drinks. Condiments Salt. Salt substitutes. Soy sauce. Sweets and Desserts Ice cream. Chocolate. Candied nuts. Fats and Oils Butter. Margarine. Other Ready-made frozen meals. Canned soups. The items listed above may not be a complete list of foods and beverages to avoid. Contact your dietitian for more information. This information is not intended to replace advice given to you by your health care provider. Make sure you discuss any questions you have with your health care provider. Document Released: 01/28/2012 Document Revised: 01/04/2016 Document Reviewed: 03/01/2014 Elsevier Interactive Patient Education  2018 [employer].  Potassium (K) Test Why am I having this test?  The potassium test is performed to determine how much potassium you have in  your blood. Potassium is an important nutrient that helps your muscles and nerves to function normally. It also helps to maintain a stable acid-base balance in your bloodstream. Most of the bodys potassium is inside of cells, and only a very small amount is in the blood. Because the amount of potassium in the blood is so small, minor changes can have big effects. Potassium blood levels are affected by many factors, including kidney function, the presence of certain hormones in your blood, and how much salt (sodium) leaves your body through urination. Your health care provider may want you to have this test if he or she suspects any problems with these factors. The test may also be done as a part of routine blood work, to help diagnose the cause of a serious illness, or to monitor treatment for conditions such as heart disease or heart failure. What kind of sample is taken? A blood sample is required for this test. It is usually collected by inserting a needle into a vein or by sticking a finger with a small needle. How do I prepare for this test? There is no preparation required for this test. What do the results mean? Your results will be compared with a reference range of values for this test. The reference ranges for the potassium test are as follows:  Adult or elderly: 3.5-5.0 mEq/L or 3.5-5.0 mmol/L (SI units).  Child: 3.4-4.7 mEq/L.  Infant: 4.1-5.3 mEq/L.  Newborn: 3.9-5.9 mEq/L.  Test results that are higher than normal can indicate a number of health conditions. These may include:  Too much dietary intake of potassium-rich foods.  Too much IV intake of potassium-rich solutions.  Kidney failure.  Addison disease.  Decreased production of the aldosterone hormone by the kidneys (hypoaldosteronism).  Crush injury to tissues.  The breakdown of red blood cells in the spleen (hemolysis).  Transfusion of broken-down (hemolyzed) blood cells.  Infection.  A condition in which  your blood is too acidic (acidosis).  Dehydration.  Test results that are lower than normal can also indicate a number of health conditions. These may include:  Not enough dietary intake of potassium-rich foods.  Not enough IV intake of potassium-rich solutions.  Complications from a burn.  Conditions that cause excessive or prolonged diarrhea or vomiting.  Use of diuretics. These are medicines that encourage extra fluid loss from your body through urination.  Too much production of aldosterone hormone by the kidneys (hyperaldosteronism).  Cushing syndrome.  Kidney disease.  Consuming too much licorice. When licorice is eaten in large amounts, it functions like the hormone aldosterone.  A condition in which your blood is not acidic enough (alkalosis).  Use of insulin.  Taking in too much sugar (glucose) as a treatment for a low blood glucose level.  Excess fluid in the abdomen (ascites).  Narrowing or partial blockage of blood vessels to the kidney (renal artery stenosis).  Cystic fibrosis.  Complications from trauma.  Surgery.  Reference values may vary among different labs and hospitals. Talk to your health care provider to discuss whether your results mean something significant for you. It is your responsibility to obtain your test results. Ask the lab or department performing the test when and how you will get your results. Talk with your health care provider if you have any questions about your results. Talk with your health care provider to discuss your results, treatment options, and if necessary, the need  for more tests. Talk with your health care provider if you have any questions about your results. This information is not intended to replace advice given to you by your health care provider. Make sure you discuss any questions you have with your health care provider. Document Released: 08/31/2004 Document Revised: 04/03/2016 Document Reviewed:  12/22/2013 Elsevier Interactive Patient Education  [employer].

## 2017-10-21 NOTE — Evaluation (Signed)
Occupational Therapy Evaluation Patient Details Name: Travis Carlson MRN: [id number] DOB: Dec 13, 1945 Today's Date: 10/21/2017    History of Present Illness 72 yo male former smoker with hx of ESRD followed at [facility] and [facility] has several admissions for hyperkalemia. He presented to [facility] after fall, and found to have hyperkalemia with bradycardia causing altered mental status. 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    Clinical Impression   Mod I with ADLs, guarded/slower pace. Cues for safety during ADL mobility, pt refuses to use RW and states that he wil use his cane at home. All education completed and no further acute OT is indicated at this time    Follow Up Recommendations  No OT follow up;Supervision - Intermittent    Equipment Recommendations  None recommended by OT    Recommendations for Other Services       Precautions / Restrictions Precautions Precautions: Fall Precaution Comments: ostomy Restrictions Weight Bearing Restrictions: No      Mobility Bed Mobility Overal bed mobility: Needs Assistance Bed Mobility: Supine to Sit     Supine to sit: Supervision;HOB elevated     General bed mobility comments: pt up in recliner upon arrival  Transfers Overall transfer level: Needs assistance Equipment used: Rolling walker (2 wheeled) Transfers: Sit to/from Stand Sit to Stand: Min guard         General transfer comment: guarded, slow place    Balance Overall balance assessment: Needs assistance Sitting-balance support: Feet supported Sitting balance-Leahy Scale: Good     Standing balance support: No upper extremity supported Standing balance-Leahy Scale: Fair Standing balance comment: able to perform unsupported static standing                           ADL either  performed or assessed with clinical judgement   ADL Overall ADL's : Modified independent                                       General ADL Comments: Mod I with ADLs, guarded/slower pace. Cues for safety during ADL mobility, pt refuses to use RW and states that he wil use his cane at home     Vision Patient Visual Report: No change from baseline       Perception     Praxis      Pertinent Vitals/Pain Pain Assessment: No/denies pain Pain Score: 0-No pain Pain Intervention(s): Monitored during session     Hand Dominance Right   Extremity/Trunk Assessment Upper Extremity Assessment Upper Extremity Assessment: Overall WFL for tasks assessed   Lower Extremity Assessment Lower Extremity Assessment: Defer to PT evaluation       Communication Communication Communication: No difficulties   Cognition Arousal/Alertness: Awake/alert Behavior During Therapy: Flat affect Overall Cognitive Status: Within Functional Limits for tasks assessed                                 General Comments: appears cognitively appropriate. may have some decreased awareness of safety and precautions or may just be stubborn.   General Comments  irregular HR rhythm but otherwise VSS throughout    Exercises     Shoulder Instructions  Home Living Family/patient expects to be discharged to:: Private residence Living Arrangements: Alone   Type of Home: House Home Access: Stairs to enter Technical brewer of Steps: 1   Home Layout: One level     Bathroom Shower/Tub: Teacher, early years/pre: Standard     Home Equipment: Marine scientist - single point          Prior Functioning/Environment Level of Independence: Independent        Comments: reports drives to dialysis        OT Problem List: Impaired balance (sitting and/or standing);Decreased activity tolerance      OT Treatment/Interventions:      OT Goals(Current goals can  be found in the care plan section) Acute Rehab OT Goals Patient Stated Goal: to go home and see his dog OT Goal Formulation: With patient  OT Frequency:     Barriers to D/C:    no barriers       Co-evaluation              AM-PAC PT "6 Clicks" Daily Activity     Outcome Measure Help from another person eating meals?: None Help from another person taking care of personal grooming?: None Help from another person toileting, which includes using toliet, bedpan, or urinal?: None Help from another person bathing (including washing, rinsing, drying)?: None Help from another person to put on and taking off regular upper body clothing?: None Help from another person to put on and taking off regular lower body clothing?: None 6 Click Score: 24   End of Session Equipment Utilized During Treatment: Gait belt  Activity Tolerance: Patient tolerated treatment well Patient left: in chair;with call bell/phone within reach;with chair alarm set  OT Visit Diagnosis: Other abnormalities of gait and mobility (R26.89)                Time: 1041-1106 OT Time Calculation (min): 25 min Charges:  OT General Charges $OT Visit: 1 Visit OT Evaluation $OT Eval Moderate Complexity: 1 Mod OT Treatments $Therapeutic Activity: 8-22 mins G-Codes: OT G-codes **NOT FOR INPATIENT CLASS** Functional Assessment Tool Used: AM-PAC 6 Clicks Daily Activity     Britt Bottom 10/21/2017, 12:58 PM

## 2017-10-21 NOTE — Care Management Note (Signed)
Case Management Note  Patient Details  Name: Travis Carlson MRN: [id number] Date of Birth: [birth year]  Subjective/Objective:                    Action/Plan: Pt discharging home with [facility] services. Pt is high risk for readmission so made [place]. Pt agreeable to [facility] services with Alvis Lemmings who is doing [place]. Cory with [facility] notified of d/c and HH needs. Pt will need [place] RN to go over and monitor home meds and possible do a pill box. Tommi Rumps made aware.  Pt states he has friends that are able to assist him at home and to provide transportation to HD if needed.  Pt states he lost his pants. CM was able to give him some pants for the trip home.  Pt states he has transportation home.    Expected Discharge Date:  10/21/17               Expected Discharge Plan:  IP Rehab Facility  In-House Referral:     Discharge planning Services  CM Consult  Post Acute Care Choice:  Home Health Choice offered to:  Patient  DME Arranged:    DME Agency:     HH Arranged:  PT, RN, OT, Nurse's Aide, Social Work [employer] Agency:  [place]  Status of Service:  Completed, signed off  If discussed at [employer] of Stay Meetings, dates discussed:    Additional Comments:  Pollie Friar, RN 10/21/2017, 2:03 PM

## 2017-10-21 NOTE — Discharge Summary (Addendum)
Discharge Summary  Travis Carlson TIW:[id number] DOB: [birth year]  PCP: Parke Poisson, MD  Admit date: 10/13/2017 Discharge date: 10/21/2017  Time spent: 25 minutes  Recommendations for Outpatient Follow-up:  1. Follow-up with neurology 2. Follow-up with urology 3. Follow-up with nephrology and monitor K+ level 4. Take your medications as prescribed 5. Continue physical therapy at home 6. Fall precaution  Discharge Diagnoses:  Active Hospital Problems   Diagnosis Date Noted  . Bradycardia 10/13/2017  . Generalized weakness   . Hyperkalemia   . ESRD (end stage renal disease) on dialysis [facility]) 01/22/2016    Resolved Hospital Problems  No resolved problems to display.    Discharge Condition: Stable  Diet recommendation: Renal dialysis diet  Vitals:   10/21/17 0825 10/21/17 1234  BP: (!) 172/58 (!) 109/54  Pulse: (!) 54 (!) 55  Resp: 15 17  Temp: 98.3 F (36.8 C) 98.3 F (36.8 C)  SpO2: 98% 98%    History of present illness:  72 yo male former smoker with hx of ESRD followed at [facility] and [facility] has several admissions for hyperkalemia. He presented to [facility] after [facility], and found to have hyperkalemia with bradycardia causing altered mental status. 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 Patient was admitted under the care of pulmonology in the ICU. Transferred to stepdown.  On the evening of 10/16/17 a code stroke was called. Neurology recommended MRI brain which has been ordered and is pending.  10/17/17: patient seen and examined at his bedside. He is alert but confused. States he feels tired post hemodialysis. Denies chest pain or dyspnea. Minimally interactive.  10/18/17: Hypotensive this am. Midodrine started 5 mg TID. reports severe testicular tenderness out of proportion to exam. Scrotal U/S ordered. More alert today but persistent weakness. Will obtain MRI today.  10/19/2017:  Reports persistent pain in his scrotum.  Ultrasound of scrotum with Doppler did not reveal torsion.  Will get urology consult.  Will be taken down for MRI brain today.  Has no other complaints.  10/20/2017: No acute events reported overnight.  Persistent scrotal pain.  On p.o. antibiotics and NSAIDs.  Bradycardic this morning with heart rate in the low 40s not on beta-blocker or calcium channel blocker.  Cardiology consulted.  Patient denies any chest pain dyspnea or palpitations.  No dizziness.  10/21/2017: No new complaints.  States his scrotum is feeling better.  Denies any chest pain dyspnea or palpitations.  No plan for pacemaker per cardiology.  On the day of discharge patient was hemodynamically stable.  He will need to continue physical therapy at home.  Patient refuses placement to inpatient rehab, skilled nursing facility for short-term rehab and requested to go home regardless of the risks associated.  Patient advised to practice fall precautions.  Hospital Course:  Principal Problem:   Bradycardia Active Problems:   ESRD (end stage renal disease) on dialysis (HCC)   Generalized weakness   Hyperkalemia  Acute metabolic encephalopathy, resolved -Suspect toxic metabolic encephalopathy possibly related to hyponatremia -Resolved -Fall precaution -MRI done  10/19/2017 revealed: 1. No acute intracranial abnormality. 2. Moderate chronic small vessel ischemic disease and cerebral atrophy. 3. 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  The patient has no recall of this. -scrotal U/S ordered with no sign of testicular torsion -no rash, vesicles, or purulence noted -Urology consulted recommended p.o. antibiotics, NSAIDs, ice at the scrotum and 2 weeks follow-up -Spoke with Dr. Louis Meckel, urology on the phone on 10/19/2017. Who will see him at the clinic in 2 weeks. -Continue p.o. doxycycline day 1 x 10 days -Continue NSAIDS for pain management, icing, tight fitting underwear, as recommended by urology  Hyperkalemia,  resolved -Potassium 3.8 from 3.5 from 6.1 from 7.5 from 7.3 -Nephrology following -Dialyzed 10/17/17 and 10/18/2017 -Kayexalate given  Hyponatremia, stable -Asymptomatic -Na+ 130 from 133 from 129 from 126 -In the setting of end-stage renal disease on dialysis -Nephrology following  Elevated troponin -Peaked at 0.13 -Trended down to 0.12 (10/14/2017)  Leukocytosis with no clear source of infection, resolved -WBC 10.3 from 14.1 from 6.4 -Repeat CBC in the morning -Chest x-ray right lower lobe infiltrates versus atelectasis as reviewed by myself -Pro-calcitonin 39.25 from 16.8 -T-max 99.1 last night -blood cultures x2 no growth l in 4 days -Treated empirically with IV vancomycin and IV Zosyn -No fevers or leukocytosis in the last 72 hours -DC IV antibiotics  End-stage renal disease on hemodialysis Monday Wednesday Friday -Continue hemodialysis -Had hemodialysis 10/17/2017, 10/18/2017, and  10/20/2017 -Nephrology following.   -Keep hemodialysis appointments  Hypomagnesemia -Magnesium 1.9 -nephrology following  GERD -Protonix p.o.  Ambulatory dysfunction, most likely secondary to old stroke versus others -Patient refuses inpatient rehab or skilled nursing facility placement -We will send home with home health physical therapy, home health, OT, RN, social work to assist in placement if the patient later decided to go to a facility for physical rehab. -Patient advised on fall precaution -Continue physical therapy at home to improve balance.   Procedures:  Hemodialysis 10/17/2017 10/18/17 and 10/20/17  Consultations:  Nephrology  Cardiology  Neurology  Case manager  Discharge Exam: BP (!) 109/54 (BP Location: Left Arm)   Pulse (!) 55   Temp 98.3 F (36.8 C) (Oral)   Resp 17   Ht 5\' 9"  (1.753 m)   Wt 65.6 kg (144 lb 10 oz)   SpO2 98%   BMI 21.36 kg/m   General: 72 year old Caucasian male disheveled man in bed in no apparent acute distress.  Alert and oriented x4.  Right eye blindness. Cardiovascular: Rate is somewhat slow in the mid 50s, regular rhythm.  No rubs or gallops. Respiratory: Clear to auscultation with no wheezes no rales  Discharge Instructions You were cared for by a hospitalist during your hospital stay. If you have any questions about your discharge medications or the care you received while you were in the hospital after you are discharged, you can call the unit and asked to speak with the hospitalist on call if the hospitalist that took care of you is not available. Once you are discharged, your primary care physician will handle  any further medical issues. Please note that NO REFILLS for any discharge medications will be authorized once you are discharged, as it is imperative that you return to your primary care physician (or establish a relationship with a primary care physician if you do not have one) for your aftercare needs so that they can  reassess your need for medications and monitor your lab values.   Allergies as of 10/21/2017      Reactions   Bactrim [sulfamethoxazole-trimethoprim] Other (See Comments)   States muscle weakness for 2 days - unable to stand "it like to have killed me"   Lisinopril Other (See Comments)   Weakness  renal function abnormalities   Metformin Anaphylaxis   GI Upset (intolerance) "it like to have killed me I couldn't get out of my chair for three days"      Medication List    STOP taking these medications   dorzolamide-timolol 22.3-6.8 MG/ML ophthalmic solution Commonly known as:  COSOPT   HYDROcodone-acetaminophen 5-325 MG tablet Commonly known as:  NORCO/VICODIN     TAKE these medications   aspirin 81 MG EC tablet Take 1 tablet (81 mg total) by mouth daily. Start taking on:  10/22/2017   atorvastatin 10 MG tablet Commonly known as:  LIPITOR Take 1 tablet (10 mg total) by mouth daily at 6 PM.   AURYXIA 1 GM 210 MG(Fe) tablet Generic drug:  ferric citrate Take 210-630 mg by mouth See admin instructions. Take three tablets (630 mg) by mouth three times daily with meals and one tablet (210 mg) with snacks   doxycycline 100 MG tablet Commonly known as:  VIBRA-TABS Take 1 tablet (100 mg total) by mouth 2 (two) times daily for 9 days.   gabapentin 300 MG capsule Commonly known as:  NEURONTIN Take 300 mg by mouth at bedtime.   hydrOXYzine 10 MG tablet Commonly known as:  ATARAX/VISTARIL Take 10 mg by mouth at bedtime as needed (insomnia).   insulin glargine 100 unit/mL Sopn Commonly known as:  LANTUS Inject into the skin daily.   insulin lispro 100 UNIT/ML injection Commonly known as:  HUMALOG Inject 0-15 Units into the skin 3 (three) times daily with meals. Per sliding scale   latanoprost 0.005 % ophthalmic solution Commonly known as:  XALATAN Place 1 drop into the left eye at bedtime.   loperamide 2 MG capsule Commonly known as:  IMODIUM Take 2 mg by mouth 4  (four) times daily.   Magnesium Oxide 400 (240 Mg) MG Tabs Take 800 mg by mouth 2 (two) times daily.   midodrine 5 MG tablet Commonly known as:  PROAMATINE Take 1 tablet (5 mg total) by mouth 3 (three) times daily with meals. What changed:    when to take this  additional instructions   multivitamin Tabs tablet Take 1 tablet by mouth at bedtime.   oxybutynin 5 MG tablet Commonly known as:  DITROPAN Take 5 mg by mouth every 8 (eight) hours as needed for bladder spasms.   pantoprazole 40 MG tablet Commonly known as:  PROTONIX Take 40 mg by mouth daily.   PRESCRIPTION MEDICATION   sevelamer carbonate 800 MG tablet Commonly known as:  RENVELA Take 2 tablets (1,600 mg total) by mouth 3 (three) times daily with meals.   sodium bicarbonate 650 MG tablet Take 1,300 mg by mouth 3 (three) times daily.   sodium polystyrene powder Commonly known as:  KAYEXALATE Take 15 g by mouth daily. What changed:    how much to take  when to take this  additional instructions   VELPHORO 500 MG chewable tablet Generic drug:  sucroferric oxyhydroxide Chew by mouth.      Allergies  Allergen Reactions  . Bactrim [Sulfamethoxazole-Trimethoprim] Other (See Comments)    States muscle weakness for 2 days - unable to stand "it like to have killed me"   . Lisinopril Other (See Comments)    Weakness  renal function abnormalities  . Metformin Anaphylaxis    GI Upset (intolerance) "it like to have killed me I couldn't get out of my chair for three days"   Follow-up Information    Ardis Hughs, MD. Schedule an appointment as soon as possible for a visit in 2 week(s).   Specialty:  Urology Contact information: [place] [address] [phone]        Parke Poisson, MD Follow up in 3 day(s).   Specialty:  Internal Medicine Contact information: [place] [address] [phone]        Croitoru, Dani Gobble, MD Follow up in 1  week(s).   Specialty:  Cardiology Contact information: [address] [place] [address] [phone]        Amie Portland, MD Follow up in 2 week(s).   Specialty:  Neurology Contact information: [place] [address] [phone]            The results of significant diagnostics from this hospitalization (including imaging, microbiology, ancillary and laboratory) are listed below for reference.    Significant Diagnostic Studies: Ct Head Wo Contrast  Result Date: 10/13/2017 CLINICAL DATA:  Minor head trauma, altered mental status after fall hitting forehead EXAM: CT HEAD WITHOUT CONTRAST TECHNIQUE: Contiguous axial images were obtained from the base of the skull through the vertex without intravenous contrast. COMPARISON:  CT brain scan of 07/12/2014 FINDINGS: Brain: The ventricular system remains prominent, as are the cortical sulci, indicative of diffuse atrophy. Mild small vessel ischemic changes again noted throughout the periventricular white matter. No hemorrhage, mass lesion, or acute infarction is seen. The fourth ventricle and basilar cisterns are unremarkable. Vascular: No vascular abnormality is seen on this unenhanced study. Skull: On bone window images, no acute calvarial abnormality is seen. Sinuses/Orbits: Sclerotic focus within the left ethmoid air cells may represent osteoma. Nodular areas within the maxillary sinuses are most consistent with either retention cysts or polyps. Other: None. IMPRESSION: 1. Stable atrophy and mild small vessel ischemic change. No acute intracranial abnormality. 2. Nodular soft tissue lesions within the maxillary sinus may represent retention cysts or polyps. Electronically Signed   By: Ivar Drape M.D.   On: 10/13/2017 16:41   Mr Brain Wo Contrast  Result Date: 10/19/2017 CLINICAL DATA:  Altered mental status with right-sided facial droop and right-sided weakness. EXAM: MRI HEAD WITHOUT CONTRAST  TECHNIQUE: Multiplanar, multiecho pulse sequences of the brain and surrounding structures were obtained without intravenous contrast. COMPARISON:  Head CT 10/16/2017 FINDINGS: Brain: There is no evidence of acute infarct, intracranial hemorrhage, mass, midline shift, or extra-axial fluid collection. There is moderate, central predominant cerebral atrophy. Patchy T2 hyperintensities in the subcortical and deep cerebral white matter are nonspecific but compatible with moderate chronic small vessel ischemic disease. 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 Punctate T2 hyperintensities in the basal ganglia bilaterally are attributed to perivascular spaces. Vascular: Loss of the normal flow void in the distal V3 and V4 segments of the right vertebral artery. Other major  intracranial vascular flow voids are preserved. Skull and upper cervical spine: Unremarkable bone marrow signal. Sinuses/Orbits: Postoperative changes to the globes. Small right globe. Minimal scattered mucosal thickening in the paranasal sinuses. Clear mastoid air cells. Other: None. IMPRESSION: 1. No acute intracranial abnormality. 2. Moderate chronic small vessel ischemic disease and cerebral atrophy. 3. 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 Electronically Signed   By: Logan Bores M.D.   On: 10/19/2017 11:01   Dg Chest Port 1 View  Result Date: 10/16/2017 CLINICAL DATA:  Altered level of consciousness EXAM: PORTABLE CHEST 1 VIEW COMPARISON:  10/13/2017, 01/22/2016 FINDINGS: Stent in the right upper extremity. Aortic vascular stent similar compared to prior. Airspace disease at the left base. No pleural effusion. Stable cardiomediastinal silhouette. No pneumothorax. IMPRESSION: 1. Airspace disease at the left lung base may reflect a pneumonia. 2. Otherwise no significant interval change compared to prior. Electronically Signed   By: Donavan Foil M.D.   On: 10/16/2017 20:34   Dg Chest Portable 1 View  Result  Date: 10/13/2017 CLINICAL DATA:  Bradycardia EXAM: PORTABLE CHEST 1 VIEW COMPARISON:  01/22/2016 FINDINGS: AP portable semi upright view of the chest. Borderline cardiomegaly is noted with aortic stent graft in place, unchanged in appearance. Mild vascular congestion is noted without acute pneumonic consolidation. No effusion or pneumothorax. A right upper extremity vascular stent is now noted. The dialysis catheter is no longer present. External cardiac monitoring leads and defibrillator paddles are noted. There is no acute osseous abnormality. IMPRESSION: Mild vascular congestion with borderline cardiomegaly is noted. Electronically Signed   By: Ashley Royalty M.D.   On: 10/13/2017 15:00   Ir Av Dialy Shunt Intro Needle/intracath Initial W/pta/img Right  Result Date: 10/17/2017 INDICATION: 72 year old with end-stage disease and right upper arm AV graft. Abnormal pressures during dialysis. Request for shuntogram. EXAM: RIGHT UPPER EXTREMITY SHUNTOGRAM BALLOON ANGIOPLASTY OF GRAFT AND OUTFLOW VEIN MEDICATIONS: Sedation medications ANESTHESIA/SEDATION: Versed 0.5 mg, fentanyl 50 mcg Moderate Sedation Time:  45 minutes The patient was continuously monitored during the procedure by the interventional radiology nurse under my direct supervision. FLUOROSCOPY TIME:  Fluoroscopy Time: 2 minutes 42 seconds (10.3 mGy). COMPLICATIONS: None immediate. PROCEDURE: Informed written consent was obtained from the patient after a thorough discussion of the procedural risks, benefits and alternatives. All questions were addressed. A timeout was performed prior to the initiation of the procedure. The right arm graft was accessed with an Angiocath. Diagnostic shuntogram images were obtained. The right upper arm was prepped and draped in sterile fashion. Maximal barrier sterile technique was utilized including caps, mask, sterile gowns, sterile gloves, sterile drape, hand hygiene and skin antiseptic. Skin was anesthetized with 1%  lidocaine around the Angiocath. Six [place] vascular sheath was placed over a Bentson wire. The areas of stenosis were initially angioplastied with a 7 mm x 40 mm Conquest balloon. The critical area of graft stenosis was inflated at 20 atmospheres for 2 minutes. Follow-up shuntogram images were obtained. The areas of intra stent stenosis were subsequently treated with a 8 mm x 40 mm Mustang balloon. Follow-up shuntogram images were obtained. Vascular sheath was removed with a pursestring suture. FINDINGS: Right upper arm graft is patent with pseudoaneurysm formations. There are overlapping stents presumably at the venous anastomosis and outflow vein. Central veins and arterial anastomosis are patent. There was a focal moderate to severe stenosis along the proximal aspect of the graft and just proximal to the pseudoaneurysm formations. In addition, there was mild intra stent stenosis measuring roughly  40%. The severe graft stenosis resolved after 2 minutes of inflation with a 7 mm balloon. Decreased intra stenosis following balloon dilatation with the 8 mm balloon. However, it was difficult to completely obliterate the intra stent stenosis due to the change in caliber of the stents. The more distal stent appears to be a flare stent and it was difficult to dilate this area without over dilating the more distal aspect. At the end of the procedure, the graft was widely patent with minimal intra stent narrowing. IMPRESSION: Successful balloon angioplasty of the focal stenosis involving the proximal aspect of the graft. Successful dilatation of intra stent narrowing. Difficult to completely obliterate the intra stent narrowing as described but no significant stenosis at the end of the procedure. ACCESS: This access remains amenable to future percutaneous interventions as clinically indicated. Electronically Signed   By: Markus Daft M.D.   On: 10/17/2017 09:45   US Scrotum W/doppler  Result Date: 10/18/2017 CLINICAL DATA:   Scrotal pain for 2 days. EXAM: SCROTAL ULTRASOUND DOPPLER ULTRASOUND OF THE TESTICLES TECHNIQUE: Complete ultrasound examination of the testicles, epididymis, and other scrotal structures was performed. Color and spectral Doppler ultrasound were also utilized to evaluate blood flow to the testicles. COMPARISON:  None. FINDINGS: Right testicle Measurements: 3.0 x 2.0 x 2.5 cm. No mass or microlithiasis visualized. Left testicle Measurements: 3.5 x 3.2 x 2.6 cm. No discrete mass. Heterogeneous echogenicity. No microlithiasis. Right epididymis: Normal in size. 5 mm epididymal head cyst. Otherwise unremarkable. Left epididymis:  Normal in size and appearance. Hydrocele:  None visualized. Varicocele:  Bilateral varicoceles. Pulsed Doppler interrogation of both testes demonstrates normal low resistance arterial and venous waveforms bilaterally. IMPRESSION: 1. Left testicle has heterogeneous echogenicity which is of unclear etiology. There is no evidence of torsion. There is no increased blood flow to suggest orchitis. There is no discrete mass. 2. Normal appearance of the right testicle. 3. Bilateral varicoceles.  No other abnormalities. Electronically Signed   By: Lajean Manes M.D.   On: 10/18/2017 20:23   Ct Head Code Stroke Wo Contrast  Result Date: 10/16/2017 CLINICAL DATA:  Code stroke. 72 y/o M; right facial droop and right-sided weakness. EXAM: CT HEAD WITHOUT CONTRAST TECHNIQUE: Contiguous axial images were obtained from the base of the skull through the vertex without intravenous contrast. COMPARISON:  10/13/2017 CT head. FINDINGS: Brain: No evidence of acute infarction, hemorrhage, hydrocephalus, extra-axial collection or mass lesion/mass effect. Small chronic lacunar infarct in right thalamus. Stable chronic microvascular ischemic changes and parenchymal volume loss of the brain. Vascular: Calcific atherosclerosis of carotid siphons and vertebral arteries. Contrast is present within the vascular system.  Skull: Normal. Negative for fracture or focal lesion. Sinuses/Orbits: Stable small right globe with increased density. Normal aeration of visualized paranasal sinuses and mastoid air cells. Other: None. ASPECTS [facility] Stroke Program Early CT Score) - Ganglionic level infarction (caudate, lentiform nuclei, internal capsule, insula, M1-M3 cortex): 7 - Supraganglionic infarction (M4-M6 cortex): 3 Total score (0-10 with 10 being normal): 10 IMPRESSION: 1. No acute intracranial abnormality identified. 2. ASPECTS is 10 3. Stable chronic microvascular ischemic changes and parenchymal volume loss of the brain. These results were communicated to Dr. Rory Percy at 7:44 pmon 3/7/2019by text page via the [facility] messaging system. Electronically Signed   By: Kristine Garbe M.D.   On: 10/16/2017 19:44    Microbiology: Recent Results (from the past 240 hour(s))  MRSA PCR Screening     Status: None   Collection Time: 10/13/17  5:03 PM  Result Value Ref Range Status   MRSA by PCR NEGATIVE NEGATIVE Final    Comment:        The GeneXpert MRSA Assay (FDA approved for NASAL specimens only), is one component of a comprehensive MRSA colonization surveillance program. It is not intended to diagnose MRSA infection nor to guide or monitor treatment for MRSA infections. Performed at [facility], [place] [address], [place], [place] [address]   Culture, blood (routine x 2)     Status: None (Preliminary result)   Collection Time: 10/17/17  5:18 PM  Result Value Ref Range Status   Specimen Description BLOOD LEFT ANTECUBITAL  Final   Special Requests   Final    BOTTLES DRAWN AEROBIC ONLY Blood Culture adequate volume   Culture   Final    NO GROWTH 4 DAYS Performed at [facility], [place] [address], [place], [place] [address]    Report Status PENDING  Incomplete  Culture, blood (routine x 2)     Status: None (Preliminary result)   Collection Time: 10/17/17  5:24 PM  Result Value Ref Range Status    Specimen Description BLOOD LEFT HAND  Final   Special Requests IN PEDIATRIC BOTTLE Blood Culture adequate volume  Final   Culture   Final    NO GROWTH 4 DAYS Performed at [facility], [place] [address], [place], [place] [address]    Report Status PENDING  Incomplete     Labs: Basic Metabolic Panel: Recent Labs  Lab 10/17/17 0746 10/17/17 1721 10/18/17 0234 10/19/17 0441 10/20/17 1219  NA 128* 131* 129* 133* 130*  K 7.5* 4.9 6.1* 3.5 3.8  CL 90* 88* 87* 95* 91*  CO2 19* 21* 23 26 24   GLUCOSE 194* 282* 231* 151* 224*  BUN 35* 26* 34* 21* 28*  CREATININE 9.54* 6.18* 7.02* 5.72* 9.01*  CALCIUM 9.7 9.0 8.5* 7.9* 8.0*  MG 1.9  --   --   --   --   PHOS 7.9* 8.4* 9.0* 4.3 4.2   Liver Function Tests: Recent Labs  Lab 10/16/17 2009  10/17/17 0746 10/17/17 1721 10/18/17 0234 10/19/17 0441 10/20/17 1219  AST 24  --   --   --   --   --   --   ALT 15*  --   --   --   --   --   --   ALKPHOS 76  --   --   --   --   --   --   BILITOT 3.3*  --   --   --   --   --   --   PROT 7.6  --   --   --   --   --   --   ALBUMIN 4.1   < > 3.7 3.3* 3.2* 2.4* 2.4*   < > = values in this interval not displayed.   No results for input(s): LIPASE, AMYLASE in the last 168 hours. Recent Labs  Lab 10/16/17 2009  AMMONIA 37*   CBC: Recent Labs  Lab 10/15/17 0553 10/16/17 2009 10/18/17 0236  WBC 6.4 14.1* 10.3  HGB 12.8* 16.5 15.5  HCT 41.6 49.8 46.6  MCV 96.5 94.1 93.6  PLT 76* 113* 131*   Cardiac Enzymes: No results for input(s): CKTOTAL, CKMB, CKMBINDEX, TROPONINI in the last 168 hours. BNP: BNP (last 3 results) No results for input(s): BNP in the last 8760 hours.  ProBNP (last 3 results) No results for input(s):  PROBNP in the last 8760 hours.  CBG: Recent Labs  Lab 10/20/17 0849 10/20/17 1654 10/20/17 2155 10/21/17 0628 10/21/17 1154  GLUCAP 233* 120* 177* 157* 159*       Signed:  Kayleen Memos, MD [facility] 10/21/2017, 1:39 PM

## 2017-10-21 NOTE — Care Management (Addendum)
Pt started on a new medication and the benefits check revealed:    S/W EBONY @ [facility] # [phone]    VELTASSA POCKET 8.4GR PO DAILY  COVER- YES  CO-PAY- $ 379.17    32% OF TOTAL DRUG COVERAGE  TIER- 5 DRUG  PRIOR APPROVAL- NO   DEDUCTIBLE : NOT MET / INITIAL COVERAGE PHASE   PREFERRED PHARMACY : [facility]   Dr Nevada Crane and Dr Moshe Cipro made aware. Pt states he can not afford this medication. CM called [facility] and asked for a tier exception for the medication. They will not have an answer for 2 days. Information to be faxed to CM.  [id number] (pending tier exception number)

## 2017-10-21 NOTE — Progress Notes (Signed)
Physical medicine rehabilitation consult requested chart reviewed. Patient ambulating extended distances. Occupational therapy has signed off. Patient will not need inpatient rehabilitation services. Defer formal rehabilitation consult at this time

## 2017-10-22 LAB — CULTURE, BLOOD (ROUTINE X 2)
Culture: NO GROWTH
Culture: NO GROWTH
SPECIAL REQUESTS: ADEQUATE
Special Requests: ADEQUATE

## 2017-12-10 ENCOUNTER — Other Ambulatory Visit: Payer: Self-pay | Admitting: Nephrology

## 2017-12-10 ENCOUNTER — Other Ambulatory Visit
Admission: RE | Admit: 2017-12-10 | Discharge: 2017-12-10 | Disposition: A | Discharge status: Home / Self Care | Payer: Medicare (Managed Care) | Source: Ambulatory Visit | Attending: Nephrology | Admitting: Nephrology

## 2017-12-10 LAB — POTASSIUM: Potassium: 7.3 mmol/L (ref 3.3–5.1)

## 2017-12-10 MED ORDER — SODIUM POLYSTYRENE SULFONATE PO POWD *I*
ORAL | 0 refills | Status: DC
Start: 2017-12-10 — End: 2018-01-09

## 2017-12-30 ENCOUNTER — Emergency Department: Admission: EM | Admit: 2017-12-30 | Payer: Medicare (Managed Care)

## 2018-01-01 ENCOUNTER — Other Ambulatory Visit
Admission: RE | Admit: 2018-01-01 | Discharge: 2018-01-01 | Disposition: A | Discharge status: Home / Self Care | Payer: Medicare (Managed Care) | Source: Ambulatory Visit | Attending: Nephrology | Admitting: Nephrology

## 2018-01-01 LAB — DATE/TIME NOT PROVIDED

## 2018-01-01 LAB — POTASSIUM: Potassium: 7.1 mmol/L (ref 3.3–5.1)

## 2018-01-03 ENCOUNTER — Other Ambulatory Visit
Admission: RE | Admit: 2018-01-03 | Discharge: 2018-01-03 | Disposition: A | Discharge status: Home / Self Care | Payer: Medicare (Managed Care) | Source: Ambulatory Visit | Attending: Nephrology | Admitting: Nephrology

## 2018-01-03 LAB — POTASSIUM: Potassium: 6.1 mmol/L — ABNORMAL HIGH (ref 3.3–5.1)

## 2018-01-09 ENCOUNTER — Encounter: Payer: Self-pay | Admitting: Gastroenterology

## 2018-01-09 ENCOUNTER — Other Ambulatory Visit: Payer: Self-pay | Admitting: Cardiology

## 2018-01-09 ENCOUNTER — Inpatient Hospital Stay
Admission: EM | Admit: 2018-01-09 | Discharge: 2018-01-31 | DRG: 640 | Disposition: A | Discharge status: Hospice | Payer: Medicare (Managed Care) | Source: Ambulatory Visit | Attending: Palliative Care | Admitting: Palliative Care

## 2018-01-09 DIAGNOSIS — H409 Unspecified glaucoma: Secondary | ICD-10-CM | POA: Diagnosis present

## 2018-01-09 DIAGNOSIS — N186 End stage renal disease: Secondary | ICD-10-CM | POA: Diagnosis present

## 2018-01-09 DIAGNOSIS — Z992 Dependence on renal dialysis: Secondary | ICD-10-CM

## 2018-01-09 DIAGNOSIS — K219 Gastro-esophageal reflux disease without esophagitis: Secondary | ICD-10-CM | POA: Diagnosis present

## 2018-01-09 DIAGNOSIS — R531 Weakness: Secondary | ICD-10-CM | POA: Diagnosis present

## 2018-01-09 DIAGNOSIS — I10 Essential (primary) hypertension: Secondary | ICD-10-CM | POA: Diagnosis present

## 2018-01-09 DIAGNOSIS — K2270 Barrett's esophagus without dysplasia: Secondary | ICD-10-CM | POA: Diagnosis present

## 2018-01-09 DIAGNOSIS — D631 Anemia in chronic kidney disease: Secondary | ICD-10-CM

## 2018-01-09 DIAGNOSIS — Z87891 Personal history of nicotine dependence: Secondary | ICD-10-CM

## 2018-01-09 DIAGNOSIS — N189 Chronic kidney disease, unspecified: Secondary | ICD-10-CM

## 2018-01-09 DIAGNOSIS — G4700 Insomnia, unspecified: Secondary | ICD-10-CM | POA: Diagnosis present

## 2018-01-09 DIAGNOSIS — E11649 Type 2 diabetes mellitus with hypoglycemia without coma: Secondary | ICD-10-CM | POA: Diagnosis present

## 2018-01-09 DIAGNOSIS — I2510 Atherosclerotic heart disease of native coronary artery without angina pectoris: Secondary | ICD-10-CM | POA: Diagnosis present

## 2018-01-09 DIAGNOSIS — E213 Hyperparathyroidism, unspecified: Secondary | ICD-10-CM | POA: Diagnosis present

## 2018-01-09 DIAGNOSIS — M25512 Pain in left shoulder: Secondary | ICD-10-CM | POA: Diagnosis present

## 2018-01-09 DIAGNOSIS — B0230 Zoster ocular disease, unspecified: Secondary | ICD-10-CM | POA: Diagnosis present

## 2018-01-09 DIAGNOSIS — K9413 Enterostomy malfunction: Secondary | ICD-10-CM | POA: Diagnosis present

## 2018-01-09 DIAGNOSIS — E873 Alkalosis: Secondary | ICD-10-CM | POA: Diagnosis present

## 2018-01-09 DIAGNOSIS — F329 Major depressive disorder, single episode, unspecified: Secondary | ICD-10-CM | POA: Diagnosis present

## 2018-01-09 DIAGNOSIS — R001 Bradycardia, unspecified: Secondary | ICD-10-CM | POA: Diagnosis present

## 2018-01-09 DIAGNOSIS — Z932 Ileostomy status: Secondary | ICD-10-CM

## 2018-01-09 DIAGNOSIS — E871 Hypo-osmolality and hyponatremia: Secondary | ICD-10-CM | POA: Diagnosis present

## 2018-01-09 DIAGNOSIS — R627 Adult failure to thrive: Secondary | ICD-10-CM | POA: Diagnosis present

## 2018-01-09 DIAGNOSIS — R51 Headache: Secondary | ICD-10-CM | POA: Diagnosis not present

## 2018-01-09 DIAGNOSIS — Z66 Do not resuscitate: Secondary | ICD-10-CM | POA: Diagnosis present

## 2018-01-09 DIAGNOSIS — Z515 Encounter for palliative care: Secondary | ICD-10-CM | POA: Diagnosis present

## 2018-01-09 DIAGNOSIS — R5383 Other fatigue: Secondary | ICD-10-CM

## 2018-01-09 DIAGNOSIS — I4891 Unspecified atrial fibrillation: Secondary | ICD-10-CM

## 2018-01-09 DIAGNOSIS — I213 ST elevation (STEMI) myocardial infarction of unspecified site: Secondary | ICD-10-CM | POA: Diagnosis present

## 2018-01-09 DIAGNOSIS — Z794 Long term (current) use of insulin: Secondary | ICD-10-CM

## 2018-01-09 DIAGNOSIS — E875 Hyperkalemia: Secondary | ICD-10-CM | POA: Diagnosis present

## 2018-01-09 DIAGNOSIS — R54 Age-related physical debility: Secondary | ICD-10-CM | POA: Diagnosis present

## 2018-01-09 DIAGNOSIS — E162 Hypoglycemia, unspecified: Secondary | ICD-10-CM

## 2018-01-09 DIAGNOSIS — R21 Rash and other nonspecific skin eruption: Secondary | ICD-10-CM | POA: Diagnosis not present

## 2018-01-09 DIAGNOSIS — I472 Ventricular tachycardia: Secondary | ICD-10-CM

## 2018-01-09 DIAGNOSIS — Z9115 Patient's noncompliance with renal dialysis: Secondary | ICD-10-CM

## 2018-01-09 DIAGNOSIS — I120 Hypertensive chronic kidney disease with stage 5 chronic kidney disease or end stage renal disease: Secondary | ICD-10-CM | POA: Diagnosis present

## 2018-01-09 DIAGNOSIS — E119 Type 2 diabetes mellitus without complications: Secondary | ICD-10-CM | POA: Diagnosis present

## 2018-01-09 DIAGNOSIS — E1122 Type 2 diabetes mellitus with diabetic chronic kidney disease: Secondary | ICD-10-CM | POA: Diagnosis present

## 2018-01-09 DIAGNOSIS — G9340 Encephalopathy, unspecified: Secondary | ICD-10-CM | POA: Diagnosis present

## 2018-01-09 DIAGNOSIS — H538 Other visual disturbances: Secondary | ICD-10-CM | POA: Diagnosis present

## 2018-01-09 DIAGNOSIS — T82858A Stenosis of vascular prosthetic devices, implants and grafts, initial encounter: Secondary | ICD-10-CM | POA: Diagnosis present

## 2018-01-09 LAB — PLASMA PROF 7 (ED ONLY)
Anion Gap,PL: 20 — ABNORMAL HIGH (ref 7–16)
CO2,Plasma: 18 mmol/L — ABNORMAL LOW (ref 20–28)
Chloride,Plasma: 92 mmol/L — ABNORMAL LOW (ref 96–108)
Creatinine: 18.36 mg/dL — ABNORMAL HIGH (ref 0.67–1.17)
GFR,Black: 3 * — AB
GFR,Caucasian: 2 * — AB
Glucose,Plasma: 185 mg/dL — ABNORMAL HIGH (ref 60–99)
Potassium,Plasma: 9.5 mmol/L (ref 3.4–4.7)
Sodium,Plasma: 130 mmol/L — ABNORMAL LOW (ref 133–145)
UN,Plasma: 94 mg/dL — ABNORMAL HIGH (ref 6–20)

## 2018-01-09 LAB — CBC AND DIFFERENTIAL
Baso # K/uL: 0 10*3/uL (ref 0.0–0.1)
Basophil %: 0.4 %
Eos # K/uL: 0.5 10*3/uL (ref 0.0–0.5)
Eosinophil %: 6.3 %
Hematocrit: 30 % — ABNORMAL LOW (ref 40–51)
Hemoglobin: 10 g/dL — ABNORMAL LOW (ref 13.7–17.5)
IMM Granulocytes #: 0 10*3/uL
IMM Granulocytes: 0.5 %
Lymph # K/uL: 1.5 10*3/uL (ref 1.3–3.6)
Lymphocyte %: 17.5 %
MCH: 31 pg/cell (ref 26–32)
MCHC: 33 g/dL (ref 32–37)
MCV: 93 fL — ABNORMAL HIGH (ref 79–92)
Mono # K/uL: 1.1 10*3/uL — ABNORMAL HIGH (ref 0.3–0.8)
Monocyte %: 13.1 %
Neut # K/uL: 5.2 10*3/uL (ref 1.8–5.4)
Nucl RBC # K/uL: 0 10*3/uL (ref 0.0–0.0)
Nucl RBC %: 0 /100 WBC (ref 0.0–0.2)
Platelets: 150 10*3/uL (ref 150–330)
RBC: 3.2 MIL/uL — ABNORMAL LOW (ref 4.6–6.1)
RDW: 15.5 % — ABNORMAL HIGH (ref 11.6–14.4)
Seg Neut %: 62.2 %
WBC: 8.4 10*3/uL (ref 4.2–9.1)

## 2018-01-09 LAB — POCT GLUCOSE
Glucose POCT: 32 mg/dL — CL (ref 60–99)
Glucose POCT: 75 mg/dL (ref 60–99)
Glucose POCT: 68 mg/dL (ref 60–99)
Glucose POCT: 98 mg/dL (ref 60–99)
Glucose POCT: 93 mg/dL (ref 60–99)

## 2018-01-09 LAB — BASIC METABOLIC PANEL
Anion Gap: 8 (ref 7–16)
Anion Gap: 12 (ref 7–16)
CO2: 34 mmol/L — ABNORMAL HIGH (ref 20–28)
CO2: 32 mmol/L — ABNORMAL HIGH (ref 20–28)
Calcium: 16.9 mg/dL (ref 8.6–10.2)
Calcium: 9.7 mg/dL (ref 8.6–10.2)
Chloride: 95 mmol/L — ABNORMAL LOW (ref 96–108)
Chloride: 92 mmol/L — ABNORMAL LOW (ref 96–108)
Creatinine: 7.99 mg/dL — ABNORMAL HIGH (ref 0.67–1.17)
Creatinine: 8.24 mg/dL — ABNORMAL HIGH (ref 0.67–1.17)
GFR,Black: 7 * — AB
GFR,Black: 7 * — AB
GFR,Caucasian: 6 * — AB
GFR,Caucasian: 6 * — AB
Glucose: 97 mg/dL (ref 60–99)
Glucose: 95 mg/dL (ref 60–99)
Lab: 27 mg/dL — ABNORMAL HIGH (ref 6–20)
Lab: 27 mg/dL — ABNORMAL HIGH (ref 6–20)
Potassium: 6 mmol/L — ABNORMAL HIGH (ref 3.3–5.1)
Potassium: 6.6 mmol/L (ref 3.3–5.1)
Sodium: 137 mmol/L (ref 133–145)
Sodium: 136 mmol/L (ref 133–145)

## 2018-01-09 LAB — TROPONIN T 3 HR W/ DELTA HIGH SENSITIVITY (IP/ED ONLY)
HS TROP % Change: 2 % (ref 0–20)
TROP T 0-3 HR DELTA High Sensitivity: 2 (ref 0–11)
TROP T 3 HR High Sensitivity: 86 ng/L — ABNORMAL HIGH (ref 0–22)

## 2018-01-09 LAB — POCT CREATININE
Creatinine, POCT: >15 mg/dL — ABNORMAL HIGH (ref 0.67–1.17)
GFR,Black POC: 3 *
GFR,Other POC: 3 *

## 2018-01-09 LAB — TROPONIN T 0 HR HIGH SENSITIVITY (IP/ED ONLY): TROP T 0 HR High Sensitivity: 84 ng/L — ABNORMAL HIGH (ref 0–22)

## 2018-01-09 LAB — HEPATITIS B PROF
HBV Core Ab: NEGATIVE
HBV S Ab Quant: 0.5 m[IU]/mL
HBV S Ab: NEGATIVE
HBV S Ag: NEGATIVE

## 2018-01-09 LAB — CALCIUM: Calcium: 9.9 mg/dL (ref 8.6–10.2)

## 2018-01-09 LAB — PHOSPHORUS: Phosphorus: 12.1 mg/dL (ref 2.7–4.5)

## 2018-01-09 LAB — MAGNESIUM: Magnesium: 2.5 mg/dL (ref 1.6–2.5)

## 2018-01-09 LAB — POTASSIUM,WB POCT: Potassium, POCT: 9 mmol/L (ref 3.4–4.7)

## 2018-01-09 MED ORDER — ALBUTEROL SULFATE (5 MG/ML) 0.5% NEBS SOLUTION *I*
INHALATION_SOLUTION | RESPIRATORY_TRACT | Status: DC
Start: 2018-01-09 — End: 2018-01-09
  Filled 2018-01-09: qty 0.5

## 2018-01-09 MED ORDER — ATROPINE SULFATE 0.1 MG/ML IJ SOLN *I*
INTRAMUSCULAR | Status: DC
Start: 2018-01-09 — End: 2018-01-09
  Filled 2018-01-09: qty 10

## 2018-01-09 MED ORDER — SODIUM BICARBONATE 8.4 % IV SOLN *I*
INTRAVENOUS | Status: DC
Start: 2018-01-09 — End: 2018-01-09
  Filled 2018-01-09: qty 100

## 2018-01-09 MED ORDER — CALCIUM CHLORIDE 10 % IV SOLN *I*
INTRAVENOUS | Status: AC
Start: 2018-01-09 — End: 2018-01-09
  Administered 2018-01-09: 14 meq via INTRAVENOUS
  Filled 2018-01-09: qty 10

## 2018-01-09 MED ORDER — GLUCOSE 40 % PO GEL *I*
15.0000 g | ORAL | Status: DC | PRN
Start: 2018-01-09 — End: 2018-01-11

## 2018-01-09 MED ORDER — DEXTROSE 50 % IV SOLN *I*
25.0000 g | INTRAVENOUS | Status: AC | PRN
Start: 2018-01-09 — End: 2018-01-09

## 2018-01-09 MED ORDER — HYDROCODONE-ACETAMINOPHEN 5-325 MG PO TABS *I*
1.0000 | ORAL_TABLET | Freq: Three times a day (TID) | ORAL | Status: DC | PRN
Start: 2018-01-09 — End: 2018-01-11
  Administered 2018-01-10: 1 via ORAL
  Filled 2018-01-09 (×2): qty 1

## 2018-01-09 MED ORDER — PANTOPRAZOLE SODIUM 40 MG PO TBEC *I*
40.0000 mg | DELAYED_RELEASE_TABLET | Freq: Every day | ORAL | Status: DC
Start: 2018-01-10 — End: 2018-01-28
  Administered 2018-01-10 – 2018-01-28 (×18): 40 mg via ORAL
  Filled 2018-01-09 (×21): qty 1

## 2018-01-09 MED ORDER — GLUCAGON HCL (RDNA) 1 MG IJ SOLR *WRAPPED*
1.0000 mg | INTRAMUSCULAR | Status: DC | PRN
Start: 2018-01-09 — End: 2018-01-09

## 2018-01-09 MED ORDER — SODIUM CHLORIDE 0.9 % IV SOLN WRAPPED *I*
5.0000 mL/h | Status: DC
Start: 2018-01-10 — End: 2018-01-11
  Administered 2018-01-10 (×13): 5 mL/h
  Administered 2018-01-10: 5 mL/h via INTRAVENOUS

## 2018-01-09 MED ORDER — GABAPENTIN 300 MG PO CAPSULE *I*
300.0000 mg | ORAL_CAPSULE | Freq: Every day | ORAL | Status: DC
Start: 2018-01-10 — End: 2018-01-09

## 2018-01-09 MED ORDER — DEXTROSE 50 % IV SOLN *I*
25.0000 g | INTRAVENOUS | Status: DC | PRN
Start: 2018-01-09 — End: 2018-01-09

## 2018-01-09 MED ORDER — INSULIN LISPRO (HUMAN) 100 UNIT/ML IJ/SC SOLN *WRAPPED*
0.0000 [IU] | Freq: Three times a day (TID) | SUBCUTANEOUS | Status: DC
Start: 2018-01-09 — End: 2018-01-17
  Administered 2018-01-10: 1 [IU] via SUBCUTANEOUS
  Administered 2018-01-11: 2 [IU] via SUBCUTANEOUS
  Administered 2018-01-11: 1 [IU] via SUBCUTANEOUS
  Administered 2018-01-12: 7 [IU] via SUBCUTANEOUS
  Administered 2018-01-13: 8 [IU] via SUBCUTANEOUS
  Administered 2018-01-14: 6 [IU] via SUBCUTANEOUS
  Administered 2018-01-14: 10 [IU] via SUBCUTANEOUS
  Administered 2018-01-15: 11 [IU] via SUBCUTANEOUS
  Administered 2018-01-15: 2 [IU] via SUBCUTANEOUS
  Administered 2018-01-16: 4 [IU] via SUBCUTANEOUS
  Administered 2018-01-16: 9 [IU] via SUBCUTANEOUS
  Administered 2018-01-17: 10 [IU] via SUBCUTANEOUS
  Administered 2018-01-17: 3 [IU] via SUBCUTANEOUS

## 2018-01-09 MED ORDER — INSULIN GLARGINE 100 UNIT/ML SC SOLN *WRAPPED*
10.0000 [IU] | Freq: Every evening | SUBCUTANEOUS | Status: DC
Start: 2018-01-09 — End: 2018-01-12
  Administered 2018-01-10 – 2018-01-11 (×2): 10 [IU] via SUBCUTANEOUS

## 2018-01-09 MED ORDER — INSULIN REGULAR HUMAN 100 UNIT/ML IJ SOLN *I*
INTRAMUSCULAR | Status: AC
Start: 2018-01-09 — End: 2018-01-09
  Administered 2018-01-09: 10 [IU] via INTRAVENOUS
  Filled 2018-01-09: qty 3

## 2018-01-09 MED ORDER — DEXTROSE 50 % IV SOLN *I*
INTRAVENOUS | Status: AC
Start: 2018-01-09 — End: 2018-01-09
  Administered 2018-01-09: 25 g via INTRAVENOUS
  Filled 2018-01-09: qty 50

## 2018-01-09 MED ORDER — NEPHRO-VITE 0.8 MG PO TABS *I*
1.0000 | ORAL_TABLET | Freq: Every day | ORAL | Status: DC
Start: 2018-01-10 — End: 2018-01-28
  Administered 2018-01-10 – 2018-01-28 (×18): 1 via ORAL
  Filled 2018-01-09 (×21): qty 1

## 2018-01-09 MED ORDER — CALCIUM GLUCONATE 9.4 MEQ (2,000 MG) IN NS 110 ML *I*
9.4000 meq | Freq: Once | INTRAVENOUS | Status: DC
Start: 2018-01-09 — End: 2018-01-09

## 2018-01-09 MED ORDER — DEXTROSE 50 % IV SOLN *I*
25.0000 g | INTRAVENOUS | Status: DC | PRN
Start: 2018-01-09 — End: 2018-01-11

## 2018-01-09 MED ORDER — GLUCAGON HCL (RDNA) 1 MG IJ SOLR *WRAPPED*
1.0000 mg | INTRAMUSCULAR | Status: DC | PRN
Start: 2018-01-09 — End: 2018-01-11

## 2018-01-09 MED ORDER — CALCIUM CHLORIDE 10 % IV SOLN *I*
14.0000 meq | Freq: Once | INTRAVENOUS | Status: AC
Start: 2018-01-09 — End: 2018-01-09

## 2018-01-09 MED ORDER — INSULIN GLARGINE 100 UNIT/ML SC SOLN *WRAPPED*
20.0000 [IU] | Freq: Every evening | SUBCUTANEOUS | Status: DC
Start: 2018-01-09 — End: 2018-01-09

## 2018-01-09 MED ORDER — INSULIN REGULAR HUMAN 100 UNIT/ML IJ SOLN *I*
10.0000 [IU] | Freq: Once | INTRAMUSCULAR | Status: AC
Start: 2018-01-09 — End: 2018-01-09

## 2018-01-09 MED ORDER — DEXTROSE 50 % IV SOLN *I*
25.0000 g | Freq: Once | INTRAVENOUS | Status: AC
Start: 2018-01-09 — End: 2018-01-09

## 2018-01-09 MED ORDER — SEVELAMER CARBONATE 800 MG PO TABS *I*
1600.0000 mg | ORAL_TABLET | Freq: Three times a day (TID) | ORAL | Status: DC
Start: 2018-01-09 — End: 2018-01-10
  Administered 2018-01-10: 1600 mg via ORAL
  Filled 2018-01-09 (×4): qty 2

## 2018-01-09 MED ORDER — GLUCOSE 40 % PO GEL *I*
15.0000 g | ORAL | Status: DC | PRN
Start: 2018-01-09 — End: 2018-01-09

## 2018-01-09 NOTE — Progress Notes (Signed)
Nephrology Consult Attending    Pt seen on hemodialysis to assess hemodynamic stability, ultrafiltration goal.      Presently dialyzing on F180. UF at 3L.  BP: (!) 126/48  Continue current dialysis prescription.    Plan for HD again tomorrow    Author: Henreitta Cea, MD  as of: 01/09/2018  at: 6:30 PM

## 2018-01-09 NOTE — ED Procedure Documentation (Addendum)
Procedures   Ultrasound - Procedure Guidance  Date/Time: 01/09/2018 12:00 PM  Performed by: Tad Moore  Authorized by: [employer]       Procedure details:     Indications: vascular access      Guidance: dynamic       Exam limitations: patient discomfort    Impression:     successful procedure guidance         Left femoral introducer placement under ultrasound guidance.   Images not saved due to acuity of condition.           Tad Moore, MD     Tad Moore, MD  Resident  01/09/18 2039    Ultrasound Procedure: I supervised the procedure, and was immediately available during the procedure.    Images: Images were reviewed and interpreted by me and I agree with the resident interpretation as documented.    Toy Care, MD as of 01/10/2018 at 1:25 PM         Toy Care, MD  01/10/18 1325

## 2018-01-09 NOTE — Progress Notes (Signed)
Report Given To   01/09/2018  Report given to [facility], RN, floor nurse from [phone]  Pt tolerated 4hrs of HD Tx, -.9L net fluid removal.   BP soft at times UFG lowered and 200 NSBtimes 3 given to keep SBP greater than 100.  Carver Fila, RN         Descriptive Sentence / Reason for Admission   Duration of Treatment (minutes): 741 minutes  Complications: see note above        Active Issues / Relevant Events   Review of medications administered  such as routine antibiotics, and heparin:none  Vital Signs: BP soft at times        To Do List  Medications still needing administration: none HD related  Dressing change due :Please remove 6 hours after Tx.       Anticipatory Guidance / Discharge Planning  Date/Time of Next Treatment: 01/10/2018. Please obtain daily weights to monitor fluid volume.

## 2018-01-09 NOTE — ED Provider Notes (Addendum)
History   No chief complaint on file.    Victor Flowers is a 72 y.o. male with PMH ESRD on HD who presents to the [facility] as a STEMI alert. Per EMS, [place] and missed his dialysis session Tuesday and is due for a session today. EMS was called for patient complaint of generalized weakness, left shoulder pain, and right jaw pain. Currently no CP but feels unwell. Cardiology attending at bedside for patient arrival.         History provided by:  EMS personnel and patient  History limited by:  Acuity of condition  Language interpreter used: No        Medical/Surgical/Family History     Past Medical History:   Diagnosis Date    Barrett's esophagus     BPH (benign prostatic hyperplasia)     ESRD needing dialysis     FAP (familial adenomatous polyposis)     HLD (hyperlipidemia)     HTN (hypertension)         Patient Active Problem List   Diagnosis Code    Encephalopathy acute G93.40    URI (upper respiratory infection) J06.9    Vision disturbance H53.9    ESRD (end stage renal disease) on dialysis N18.6, Z99.2    DM type 2 (diabetes mellitus, type 2) E11.9    Chronic pain G89.29            Past Surgical History:   Procedure Laterality Date    PR TEMPORAL ARTERY LIGATN OR BX N/A 03/28/2017    Procedure: TEMPORAL ARTERY BIOPSY;  Surgeon: Anselmo Rod, MD;  Location: [facility] MAIN OR;  Service: Ophthalmology     No family history on file.       Social History   Substance Use Topics    Smoking status: Former Smoker    Smokeless tobacco: Never Used    Alcohol use No     Living Situation     Questions Responses    Patient lives with     Homeless     Caregiver for other family member     External Services     Employment     Domestic Violence Risk                 Review of Systems   Review of Systems   Unable to perform ROS: Acuity of condition       Physical Exam     Triage Vitals      First Recorded  ,    .      Physical Exam   Constitutional: He is oriented to person, place, and time.  He appears well-developed.   Appears chronically ill and acutely toxic  Grey/pale appearing. Ill    HENT:   Head: Normocephalic and atraumatic.   Eyes: EOM are normal.   Neck: Neck supple.   Cardiovascular: Intact distal pulses.    Bradycardic to the high 40s-50s  Intermittently irregular   Pulmonary/Chest: Effort normal and breath sounds normal. No stridor.   Abdominal: Soft. He exhibits no distension. There is no tenderness.   Musculoskeletal: Normal range of motion. He exhibits no edema.   Neurological: He is oriented to person, place, and time. He exhibits normal muscle tone. Coordination normal.   Lethargic    Skin: Skin is warm and dry.   Psychiatric: He has a normal mood and affect.   Nursing note and vitals reviewed.      Medical Decision  Making        Initial Evaluation:  ED First Provider Contact     Date/Time Event User Comments    01/09/18 1113 ED First Provider Contact LOWY, REBECCA Initial Face to Face Provider Contact          Patient seen by me on arrival date of 01/09/2018.    Assessment:  72 y.o.male comes to the ED as a STEMI alert, however EKG on arrival with peaked T waves, loss of p waves and widening of QRS- especially in the setting of missed hemodialysis sessions, this is concerning for hyperkalemia and impending sine wave/arrest. Cardiology attending at beside, agrees no STEMI. Vascular access proved incredibly difficult- ultimately R EJ and L femoral cordis introducer placed with significant effort. Given 2 amp CaCl with immediate narrowing of his QRS. Repeat EKG with baseline RBBB morphology. Also given insulin/D50 and nephrology and MICU immediately consulted for emergent hemodialysis. iStat K is 9. Upon admission to the MICU for HD, K resulted from chemistry at 9.5.      Differential Diagnosis includes:  Hyperkalemia, electrolyte abnormalities, less likely ACS/PE/Dissection    Plan:   CBC, Chem, Mag, Phos, Ca, Trop, iStat K  EKGs, Telemetry  IV calcium chloride x 2,  Insulin/D50  Nephrology consult for emergent HD  MICU admission        Tad Moore, MD    Resident Attestation:    Patient seen by me on 01/09/2018.    History:  I reviewed this patient, reviewed the resident's note and agree with edits above.    Exam:  I examined this patient, reviewed the resident's note and agree with edits above.    Decision Making:  I discussed with the resident his/her documented decision making and agree with edits above.      Critical Care    There is a high probability of imminent or life threatening deterioration due to renal failure and metabolic crisis.    Acute interventions include IV meds (specify below), re-eval of pt's condition, order & review of lab studies, order & perform tx & interventions, initial hx & physical exam, eval pt's response to tx, documenting the case and discussions w/other provider.    IV medications given:  CaCl, D50/insulin    Exact time of critical care (exclusive of other billable procedures) 32 minutes.      Author:  Toy Care, MD       Tad Moore, MD  Resident  01/09/18 5809       Toy Care, MD  01/10/18 1328

## 2018-01-09 NOTE — ED Notes (Signed)
MI Alert Called  [facility]: pre-hospital notification of EKG reading STEMI in 2,3,v1v2v3. Pt is on dialysis, missed the last x2 treatments. Pt c/o L shoulder pain, hx of MI w/ stents.     STAT Page 1104 am  Text Page 1106 am

## 2018-01-09 NOTE — ED Notes (Signed)
Pt presents to the ED at a MI alert. PT states he is here visiting from [place] and missed the last 2 treatments of dialysis.  Cardiology and ED attending at bedside.  Pt stating his chest pain has resolved. Fistula in R arm.  A&Ox4.

## 2018-01-09 NOTE — ED Procedure Documentation (Addendum)
Procedures   Central line  Date/Time: 01/09/2018 11:18 PM  Performed by: Tad Moore  Authorized by: Toy Care     Consent:     Consent obtained:  Emergent situation    Consent given by:  Patient  Pre-procedure details:     Skin preparation:  ChloraPrep  Indications:     Indications:  Vascular access  Procedure details:     Location:  L femoral    Patient position:  Flat    Procedural supplies:  Cordis    Catheter size:  9 Fr    Landmarks identified: yes      Ultrasound guidance: yes      Ultrasound Images: Ultrasound utilized, but images not archived      Successful placement: yes      Guidewire removed intact: Yes    Post-procedure details:     Post-procedure:  Dressing applied and line sutured    Assessment:  Blood return through all ports    Complications:  None    Patient tolerance of procedure:  Tolerated well, no immediate complications          Tad Moore, MD     Tad Moore, MD  Resident  01/09/18 [phone]      I was present and participated during the entire procedure.    Toy Care, MD       Toy Care, MD  01/10/18 1324

## 2018-01-09 NOTE — Plan of Care (Signed)
Cognitive function     Cognitive function will be maintained or return to baseline Maintaining    Pt A&Ox3.    Pain/Comfort     Patient's pain or discomfort is manageable Maintaining        Psychosocial     Demonstrates ability to cope with illness Maintaining        Safety     Patient will remain free of falls Maintaining     Prevent any intentional injury Maintaining    Siderails up x4 to maintain safe environment.      Mobility     Patient's functional status is maintained or improved Progressing towards goal        Nutrition     Patient's nutritional status is maintained or improved Progressing towards goal    Remains NPO at this time. Will continue to reassess.

## 2018-01-09 NOTE — Consults (Addendum)
Nephrology Consult Note 01/09/2018 11:28 AM    Chief Complaint: Shoulder Pain    Reason for Consult: Provision of HD/ Hyperkalemia with EKG changes    History of Present Illness:  Victor Flowers is a 72 y.o. Male with a history of ESRD presumed secondary to drug toxicity from PPI on HD MWF? in [place], also with a history of CAD s/p PCI, HTN, IDDM2, FAP s/p subtotal colectomy/complete proctectomy (1610), prostate cancer s/p TURP (2015), bladder tumor s/p TURBT (2016), ruptured AAA s/p stent (2015), Barrett's esophagus who presented to the ED this morning as an MI alert for STEMI in the setting of left shoulder pain and missing the past 2 HD sessions. However, on arrival to the ED and review of EKG, he was found to have bradycardia to the 40s, BP 205/53, and EKG with peaked T waves in II, III, and V4-V6. POCT K 9.0 and serum K 9.5 wit CO2 18. He was given IV calcium chloride x 1 and IV regular insulin/dextrose.    Currently, patient reports right neck pain at site of EJ IV placement. Denies SOB, chest pain, abdominal pain. He states he is anuric. He doesn't seem to be certain when his last HD was but initially told me it was on Tuesday, 5/28 in [place]. Per chart Victor Flowers on 12/09/17), he has 2 sisters who live in [place], [place]. He thinks he came to [place] either yesterday or 5/29. He had POCT K 7.1 on 5/23 via [facility] (possibly in [facility] system?) and POCT K 6.1 on 01/03/18 (again possibly in [facility] system?).    Review of Systems:  Limited due to unreliable historian and acuity    Past Medical History:   Diagnosis Date    Barrett's esophagus     BPH (benign prostatic hyperplasia)     ESRD needing dialysis     FAP (familial adenomatous polyposis)     HLD (hyperlipidemia)     HTN (hypertension)        Past Surgical History:   Procedure Laterality Date    PR TEMPORAL ARTERY LIGATN OR BX N/A 03/28/2017    Procedure: TEMPORAL ARTERY BIOPSY;  Surgeon: Victor Rod, MD;   Location[facility];  Service: Ophthalmology       No family history on file.    Social History     Social History    Marital status: Divorced     Spouse name: N/A    Number of children: N/A    Years of education: N/A     Social History Main Topics    Smoking status: Former Smoker    Smokeless tobacco: Never Used    Alcohol use No    Drug use: No    Sexual activity: Not on file     Other Topics Concern    Not on file     Social History Narrative    No narrative on file       Allergies:   Allergies   Allergen Reactions    Metformin Diarrhea    Bactrim [Sulfamethoxazole-Trimethoprim] Other (See Comments)     weakness    Heparin Other (See Comments)     Thrombocytopenia. Unclear severity. Per chart review, had a positive SRA    Lisinopril Other (See Comments)     Weakness, renal function changes       Prior to Admission Medications:    (Not in a hospital admission)    Current Medications:  Current Facility-Administered Medications  Medication Dose Route Frequency    calcium gluconate in NS 9.4 mEq 100 mL  9.4 mEq Intravenous Once    atropine 0.1 mg/mL (1 mg/10 mL) injection        calcium CHLORIDE 10% (100 mg/mL) injection        sodium bicarbonate 8.4% (1 mEq/mL) injection        dextrose 50% (0.5 g/mL) injection        insulin regular (HUMULIN REGULAR, NOVOLIN REGULAR) 100 unit/mL injection         Current Outpatient Prescriptions   Medication    sodium polystyrene (KAYEXALATE) powder    oxyCODONE-acetaminophen (PERCOCET) 5-325 MG per tablet    bacitracin-polymyxin b (POLYSPORIN) ophthalmic ointment    B Complex-C-Folic Acid (RENAL-VITE) 0.8 MG TABS    dorzolamide-timolol (COSOPT) 22.3-6.8 MG/ML ophthalmic solution    HYDROcodone-acetaminophen (NORCO) 5-325 MG per tablet    insulin detemir (LEVEMIR) 100 UNIT/ML injection vial    insulin lispro 100 UNIT/ML injection vial    latanoprost (XALATAN) 0.005 % ophthalmic solution    lidocaine-prilocaine (EMLA) cream    multi-vitamin  (MULTIVITAMIN) per tablet         Physical Examination:  Vitals:   Vitals:    01/09/18 1330 01/09/18 1345 01/09/18 1400 01/09/18 1415   BP: 155/52 (!) 127/47 94/50 91/51    BP Location:       Pulse: 53 58 64 77   Resp: 13 10 10 11    Temp:       TempSrc:       SpO2: 100% 100% 99% 99%     Intake/Output last 3 shifts:No intake/output data recorded.    General: Alert/interactive. In right neck pain.  Eyes: Keeps eyes closed  HENT: MMM, oropharynx without erythema/exudate  CVS: Bradycardic but regular. S1.S2.  Pulm: Normal WOB, CTAB anterolaterally  Abd: No distension, normoactive bowel sounds, non-tender  Ext: WWP, trace LE edema  Neuro: Awake, alert, interactive, speech/language WNL, moving all extremities  Skin: No rashes or ecchymoses on visible skin  RUE AVG with bruit      Recent Labs  Lab 01/09/18  1135 01/09/18  1134 01/03/18  1217   Potassium  --   --  6.1*   Creatinine 18.36*  --   --    Calcium  --  9.9  --    Phosphorus  --  12.1*  --    Magnesium  --  2.5  --       No results for input(s): WBC, HGB, HCT, PLT in the last 168 hours.       No results found.           Assessment:    Victor Flowers is a 72 y.o. 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    ESRD on HD MWF? at [facility].  - Unclear where in [place] patient receives HD and what his schedule is but he reports receiving HD in [place] on 5/28 and then coming to [place]  - Given severe, life-threatening hyperkalemia to 9.5 with EKG changes/bradycardia, would recommend urgent transfer to MICU for emergent HD (as opposed to  HD in the HD unit) assuming this can  be done in an emergent fashion  - Will plan for emergent HD (ideally to start by 1PM) with the following parameters (2 hours on 2K, 2 hours on 1K):    F180 Duration 4 hrs Na 138 K 2/1 Ca 2.5 HCO3 35 Qb: 450 mL/min Qd: 1.5X Qb UF 2-3L as hemodynamics tolerates     - Given the degree of hyperkalemia, will also plan on an additional HD session tomorrow  - Nephrovite 1 tab daily  - Dose adjust meds to CrCl <10 mL/min    CKD-BMD:  - Ca acceptable  - Phos 12.1, should improve with HD  - Will need med rec when more stable to determine if on outpatient Phos binder    Anemia:  - H/H acceptable 10.0  - Will need to review outpatient packet for ESA    Electrolytes: Hyperkalemia  - K 9.5 as above  - Emergent 4 hour HD as above with 2 hours on 2K bath and 2 hours on 1K bath    Volume/Blood Pressure: HTN  - SBPs 200s  - Will attempt 2-3L UF as BP tolerates    Access:  - RUE AVG    Thank you for this consult. We will continue to follow along with you. Please page with questions/concerns.    Attending addendum to follow.    Rickinder Christen Butter, MD as of: 01/09/2018 at 11:28 AM  Nephrology Fellow    I saw and evaluated the patient. I agree with the resident's/fellow's findings and plan of care as documented above.  Urgent HD today for life-threatening hyperkalemia  Will plan HD again tomorrow    Henreitta Cea, MD

## 2018-01-09 NOTE — Progress Notes (Signed)
Report Given To  Deatra Ina, RN      Descriptive Sentence / Reason for Admission   Patient was on vacation here from [facility] where he lives. Pt missed dialysis treatments and presents with a K+ of >9. Needs emergent dialysis. Hx of ESRD, DM2      Active Issues / Relevant Events   NPN [id number] ICU 1200-1900: Pt admitted to MICU from ED for emergent dialysis. PT A&Ox3 but lethargic. Tolerating room air without ssx of respiratory distress. No PRNs. Pt received dialysis upon arrival from ED approx 1L removed during dialysis. BG 32. Pt reports feeling dizzy and hungry. After eating, patient reports feeling better. BG on recheck 75.  Dennard Nip, RN        To Do List  VS Q4H  BG ACHS        Anticipatory Guidance / Discharge Planning  Needs social work involvement--sisters dont think hes appropriate to go home alone    Ostomy consult to help with leakage issues--sisters requested

## 2018-01-09 NOTE — H&P (Signed)
MICU Admission Note     LOS: 0 days       CC: dyspnea    HPI/IntervalHx: Victor Flowers is a 72 y.o. male with PMH ESRD on HD, CAD, HTN, T2DM, prostate ca s/p TURP, FAP s/p subtotal colectomy who presented to the ED this AM as STEMI alert given L shoulder pain and ST elevations. This was found to be peaked T waves and QRS changes due to severe hyperkalemia of 9.5. He is visiting from [place] and missed 1-2 sessions of dialysis. At time of my visit he is quite agitated yelling that he does not want to answer any more questions. He does have a recent medication list thankfully. Per EMS records he was at his baseline until he developed generalized weakness and left shoulder pain this morning. His sister who he is visiting called EMS. In the ED BP was 205/53, bradycardia to 40s, K 9.5, phos 12.1, hgb 10, hsTrops 84->86. Given calcium IV x2. Seen by MICU and Nephrology at bedside and taken to ICU for urgent dialysis.     ROS:  Review of Systems:  Unable to complete due to acuity of condition    Family History: Noncontributory.      Medical History: As per HPI.    Objective Section:  Temp:  [36.1 C (97 F)-36.3 C (97.3 F)] 36.1 C (97 F)  Heart Rate:  [46-84] 74  Resp:  [10-22] 16  BP: (85-210)/(47-83) 132/55     Intake/Output Summary (Last 24 hours) at 01/09/18 1923  Last data filed at 01/09/18 1729   Gross per 24 hour   Intake             1000 ml   Output             1962 ml   Net             -962 ml     Vent Settings     Hemodynamics         Physical Exam:  GEN: resting in bed, angry   HENT: AT/NC, anicteric, mucous membranes moist without abnormalities  NECK: supple, mild bruising to side of neck  CVS: RRR, normal s1/s2, no m/r/g   PULM: clear to auscultation bilaterally with good inspiratory effort  ABD: bowel sounds present, soft, non tender, non distended   EXT: warm, dry, 2+ radial and dorsalis pedis pulses, no edema   NEURO: Awake, alert, and cooperative. Oriented x4. Speech is fluent, comprehension is  intact.      Lab Results:  I've reviewed all of the patients labs, I've recorded the pertinent labs as per HPI and assessment.     Micro:  I've reviewed the patients microbiology data, I've recorded the pertinent information as per HPI and assessment.    Imaging: none    Active Hospital Medications:    [START ON 01/10/2018] b complex-vitamin c-folic acid  1 tablet Oral Daily    [START ON 01/10/2018] pantoprazole  40 mg Oral Daily    sevelamer  1,600 mg Oral TID WC    insulin lispro  0-30 Units Subcutaneous TID WC    insulin glargine  10 Units Subcutaneous Nightly       HYDROcodone-acetaminophen, Nursing communication- Give 4 OZ of fruit juice for BG < 70 mg/dl **AND** dextrose **AND** dextrose **AND** glucagon **AND** POCT glucose     Assessment: Victor Flowers is a 72 y.o. male with PMH ESRD on HD, CAD, HTN, T2DM, prostate ca s/p TURP, FAP  s/p subtotal colectomy presenting with severe hyperkalemia in the setting of missed dialysis now undergoing urgent dialysis.       Plan/Recommendations:    Renal/E/F:    Hyperkalemia likely due to missed HD  - K 9.6  - Nephrology consulted, will get urgent HD now in ICU, repeat BMP afterwards  - continue home sevelamer and kayexalate on non-dialysis days  - telemetry    Pulm:   - no issues     CVS:   - EKG with bradycardia, peaked T waves, continue telemetry   - HDS    GI/Nutrition:   - low K, Low phos  - home PPI    ID:  - no issues     Heme:   - stable anemia of chronic disease likely, continue to monitor   - IPC for DVT ppx, h/o of HIT    Endo: T2DM  - Levemir 35 on hold due to hypoglycemia, continue q1h BG checks for now, resume long acting insulin once safe to do so   - ISS    Neuro/pain:   - gabapentin 300 daily, norco 5/325 PRN severe pain q8h    Integument:   - no issues     Should any home meds be restarted? no  Should any meds be d/ced? no    Are daily labs needed? yes    F: PO  E: daily (if post HD check improving)  N: low K low phos    Prophylaxis: DVT: IPC, h/o  possible HIT  Lines: PIV  Med Rec: completed      Disposition: MICU    CODE STATUS: Full Code    Author: Liliana Cline, MD, PGY-2 Internal Medicine  01/09/18  7:23 PM

## 2018-01-09 NOTE — Progress Notes (Signed)
4 Eyed in 4 Hours Skin Assessment        Victor Flowers is a 72 y.o. year old male, admitted/transferred to unit on 5/31 @ 1300    [x]  New Admission  []  Transfer: Unit    4 Eyed Skin Assessment:  Skin Breakdown Present: Yes []   No [x]    IF Yes - Suspected Pressure Injury: Yes []   No [x]    IF No- What type of breakdown suspected:         Location of Wound(s):  Description:           Interventions Put in Place:  []  Wound Consult Ordered  [x]  Hillrom bed  []  Two pillow turning and repositioning  []  Protective Allevyn Gentle dressing  []  30 degree Turning Wedge  []  Prevalon boot  []  Pillow heel offloading  []  Waffle chair cushion    Complete 4 Eyed Skin Assessment Completed with: Burna Mortimer, RN

## 2018-01-09 NOTE — ED Provider Progress Notes (Signed)
ED Provider Progress Note    Procedure: Emergency Medicine Training Ultrasound    Procedure performed by Gladstone Pih, MD   Date: 01/09/2018   Time: 11:30am.      Type  After consent was obtained from the patient, non-diagnostic, training ED ultrasound examinations: aorta and cardiac were performed          Gladstone Pih, MD, 01/09/2018, 1:53 PM     Gladstone Pih, MD  Resident  01/09/18 [phone]

## 2018-01-10 ENCOUNTER — Encounter: Payer: Self-pay | Admitting: Emergency Medicine

## 2018-01-10 DIAGNOSIS — I120 Hypertensive chronic kidney disease with stage 5 chronic kidney disease or end stage renal disease: Secondary | ICD-10-CM

## 2018-01-10 DIAGNOSIS — E873 Alkalosis: Secondary | ICD-10-CM

## 2018-01-10 LAB — RENAL FUNCTION PANEL
Albumin: 3.7 g/dL (ref 3.5–5.2)
Anion Gap: 12 (ref 7–16)
CO2: 33 mmol/L — ABNORMAL HIGH (ref 20–28)
Calcium: 9.3 mg/dL (ref 8.6–10.2)
Chloride: 92 mmol/L — ABNORMAL LOW (ref 96–108)
Creatinine: 8.7 mg/dL — ABNORMAL HIGH (ref 0.67–1.17)
GFR,Black: 6 * — AB
GFR,Caucasian: 5 * — AB
Glucose: 80 mg/dL (ref 60–99)
Lab: 29 mg/dL — ABNORMAL HIGH (ref 6–20)
Phosphorus: 7.4 mg/dL — ABNORMAL HIGH (ref 2.7–4.5)
Potassium: 6.4 mmol/L (ref 3.3–5.1)
Sodium: 137 mmol/L (ref 133–145)

## 2018-01-10 LAB — CBC AND DIFFERENTIAL
Baso # K/uL: 0 10*3/uL (ref 0.0–0.1)
Basophil %: 0.4 %
Eos # K/uL: 0.4 10*3/uL (ref 0.0–0.5)
Eosinophil %: 5 %
Hematocrit: 27 % — ABNORMAL LOW (ref 40–51)
Hemoglobin: 9.2 g/dL — ABNORMAL LOW (ref 13.7–17.5)
IMM Granulocytes #: 0.1 10*3/uL
IMM Granulocytes: 0.7 %
Lymph # K/uL: 0.8 10*3/uL — ABNORMAL LOW (ref 1.3–3.6)
Lymphocyte %: 11.3 %
MCH: 31 pg/cell (ref 26–32)
MCHC: 34 g/dL (ref 32–37)
MCV: 91 fL (ref 79–92)
Mono # K/uL: 1.1 10*3/uL — ABNORMAL HIGH (ref 0.3–0.8)
Monocyte %: 14.6 %
Neut # K/uL: 5 10*3/uL (ref 1.8–5.4)
Nucl RBC # K/uL: 0 10*3/uL (ref 0.0–0.0)
Nucl RBC %: 0 /100 WBC (ref 0.0–0.2)
Platelets: 136 10*3/uL — ABNORMAL LOW (ref 150–330)
RBC: 3 MIL/uL — ABNORMAL LOW (ref 4.6–6.1)
RDW: 15.5 % — ABNORMAL HIGH (ref 11.6–14.4)
Seg Neut %: 68 %
WBC: 7.3 10*3/uL (ref 4.2–9.1)

## 2018-01-10 LAB — POCT GLUCOSE
Glucose POCT: 83 mg/dL (ref 60–99)
Glucose POCT: 68 mg/dL (ref 60–99)
Glucose POCT: 95 mg/dL (ref 60–99)
Glucose POCT: 103 mg/dL — ABNORMAL HIGH (ref 60–99)
Glucose POCT: 91 mg/dL (ref 60–99)
Glucose POCT: 125 mg/dL — ABNORMAL HIGH (ref 60–99)
Glucose POCT: 155 mg/dL — ABNORMAL HIGH (ref 60–99)

## 2018-01-10 LAB — MAGNESIUM: Magnesium: 1.8 mg/dL (ref 1.6–2.5)

## 2018-01-10 MED ORDER — GABAPENTIN 300 MG PO CAPSULE *I*
300.0000 mg | ORAL_CAPSULE | Freq: Every day | ORAL | Status: DC
Start: 2018-01-10 — End: 2018-01-11
  Administered 2018-01-11: 300 mg via ORAL
  Filled 2018-01-10 (×3): qty 1

## 2018-01-10 MED ORDER — BENZOIN COMPOUND EX TINC *WRAPPED*
Status: AC
Start: 2018-01-10 — End: 2018-01-10
  Filled 2018-01-10: qty 0.6

## 2018-01-10 MED ORDER — SEVELAMER CARBONATE 0.8 GM PO PACK *I*
1.6000 g | PACK | Freq: Three times a day (TID) | ORAL | Status: DC
Start: 2018-01-10 — End: 2018-01-28
  Administered 2018-01-11 – 2018-01-28 (×38): 1.6 g via ORAL
  Filled 2018-01-10 (×61): qty 2

## 2018-01-10 MED ORDER — EPOETIN ALFA 3,000 UNIT/ML IJ SOLN *I*
5000.0000 [IU] | Freq: Once | INTRAMUSCULAR | Status: AC
Start: 2018-01-10 — End: 2018-01-11

## 2018-01-10 NOTE — Progress Notes (Signed)
Report Given To  [id number] RN      Descriptive Sentence / Reason for Admission   Victor Flowers   72 y.o. 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   Vacationing in [place] visiting two sisters. Missed HD x2 sessions. Received emergent HD, IV insulin/dextrose      Active Issues / Relevant Events   Nursing note: [id number] [facility] [id number]. Please see Doc flow sheet for VS, assessment detail, and nursing interventions. Patient AVSS. Patient tolerating room air. Patient denies pain. Reports weakness, and fatigue, with normal no other neuro changes. Pt sent to dialysis, will go directly to floor. Belongings and stoma equipment with pt, as well as all medications.  Cherene Julian, RN          To Do List  Call out      Anticipatory Guidance / Discharge Planning  Needs social work involvement--sisters dont think hes appropriate to go home alone    Ostomy consult to help with leakage issues--sisters requested

## 2018-01-10 NOTE — Procedures (Signed)
Procedure Report    L femoral introducer in L fem vein removed after suture removal.  Pressure held times 5 min with hemostasis achieved and dressing applied.  Nurses to recheck dressing prior to transfer to the floor.  Gennaro Africa, NP

## 2018-01-10 NOTE — Progress Notes (Signed)
Report Given To   01/10/18  stable tx; pt demanded to abruptly stop tx 33 min early; pt denied pain or discomfort but did appear suddenly agitated and anxious, crying out to stop; 1600 ml fluid removed net       Descriptive Sentence / Reason for Admission   Duration of Treatment (minutes): 762 minutes  Complications: pt suddenly anxious and agitated during last 33 min; pt demanded to stop tx         Active Issues / Relevant Events         Dialysis weight change (kg): 68.5 kg  Review of medications administered  such as routine meds, antibiotics, and heparin: unable to admin epogen d/t abrupt termination of tx   Vital Signs: VSS        To Do List  Medications still needing administration: epogen sq if possible   Dressing change due : remove avf dressing after 6 hrs       Anticipatory Guidance / Discharge Planning  Next tx tbd

## 2018-01-10 NOTE — Plan of Care (Signed)
Psychosocial     Demonstrates ability to cope with illness Goal not met    Pt increasingly anxious      Mobility     Patient's functional status is maintained or improved Progressing towards goal    Weaker than baseline      Cognitive function     Cognitive function will be maintained or return to baseline Maintaining        Nutrition     Patient's nutritional status is maintained or improved Maintaining        Pain/Comfort     Patient's pain or discomfort is manageable Maintaining        Safety     Patient will remain free of falls Maintaining     Prevent any intentional injury Maintaining

## 2018-01-10 NOTE — Progress Notes (Addendum)
Nephrology Attending    I have seen and evaluated Victor Flowers for ESRD needs.  Summary of recommendations are as follows:    ____________________________________________________________________________________________________  1) ESRD- has missed HD for quite some time (unclear how long).  Received HD urgently for hyperkalemia yesterday- still hyperkalemia, will plan another HD today    2) Hyperkalemia- due to missed HD.  Plan for 4 hour HD on 2 K bath.  Can treat K medical until then.    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  Can check iron studies.    4) Hyperphosphatemia- monitor while on HD and Renvela    5) He has a high bicarb- can check blood gas.  May be metabolic alkaosis- but unsure why he would have this after missing HD.  We can run a 30 HCO3.    6) BP has been labile, would hold BP meds with current blood pressure.    Scheduled Meds:   b complex-vitamin c-folic acid  1 tablet Oral Daily    pantoprazole  40 mg Oral Daily    sevelamer  1,600 mg Oral TID WC    insulin lispro  0-30 Units Subcutaneous TID WC    insulin glargine  10 Units Subcutaneous Nightly     Continuous Infusions:   sodium chloride 5 mL/hr (01/10/18 0659)     PRN Meds:.HYDROcodone-acetaminophen, Nursing communication- Give 4 OZ of fruit juice for BG < 70 mg/dl **AND** dextrose **AND** dextrose **AND** glucagon **AND** POCT glucose    _____________________________________________________________________________________________________  Tired  No CP, SOB, N/V  Blood pressure 116/59, pulse 85, temperature 36.2 C (97.2 F), temperature source Temporal, resp. rate 19, weight 70.1 kg (154 lb 8.7 oz), SpO2 97 %.  BP: (85-210)/(47-89)   Temp:  [36 C (96.8 F)-36.3 C (97.3 F)]   Temp src: Temporal (06/01 0400)  Heart Rate:  [46-88]   Resp:  [10-22]   SpO2:  [96 %-100 %]   Weight:  [70.1 kg (154 lb 8.7 oz)]     I/O last 3 completed shifts:  05/31 0700 - 06/01 0659  In: 1029.4 (14.7 mL/kg) [I.V.:1029.4 (0.6  mL/kg/hr)]  Out: 1963 (28 mL/kg) [Other:1962; Stool:1]  Net: -933.6  Weight: 70.1 kg   No intake/output data recorded.    Gen- Looks tired  Eyes- anicteric  Mucous membranes are dry  Cor- Regular rate and rhythm, no murmurs, rubs or gallops  Lungs- Clear to auscultation bilaterally, good respiratory effort  Abd- Soft, Non tender, non distended  Ext- No edema  Neuro- The patient is awake, alert and oriented.  Skin- The visible skin has no rashes.  AVG with good thrill      Recent Labs  Lab 01/10/18  0058 01/09/18  2239 01/09/18  2108  01/09/18  1134   Sodium 137 136 137  --   --    Potassium 6.4* 6.6* 6.0*  --   --    Chloride 92* 92* 95*  --   --    CO2 33* 32* 34*  --   --    UN 29* 27* 27*  --   --    Creatinine 8.70* 8.24* 7.99*  < >  --    Glucose 80 95 97  --   --    Albumin 3.7  --   --   --   --    Phosphorus 7.4*  --   --   --  12.1*   Calcium 9.3 9.7 16.9*  --  9.9   < > = values in this interval not displayed.     Recent Labs  Lab 01/10/18  0058 01/09/18  1134   WBC 7.3 8.4   Hemoglobin 9.2* 10.0*   Hematocrit 27* 30*   Platelets 136* 150          No results found.           Assessment:    Victor Flowers is a 72 y.o. 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    Plan as above.    Erich Montane, MD 01/10/2018 8:01 AM

## 2018-01-10 NOTE — Progress Notes (Signed)
Critical Care Attending Physician note    SYNOPSIS OF OUR ASSESSMENT AND PLANS:     Hyperkalemia due to missed dialysis sessions, improved after dialysis.   For repeat dialysis today.   Stable for transfer to floor.     If there are key historical elements or objective findings that I think deserve particular emphasis, I have recorded them below. A summary of key elements of our assessment and plans is listed above. Please refer to ICU Admission/Progress note for complete details of our mutually agreed upon findings, assessment, and recommendations.    History of current illness:    72 yo man with extensive PMH including ESRD, visiting family in [place], missed HD x 2 and presented with hyperkalemia and abnormal ECG.  He required urgent dialysis with improved labs and ECG.    Objective findings include:    A+Ox3.  No complaints.  Abd soft NT.  Extr warm.    _______________________________________________________________________________  This patient was evaluated on rounds with ICU Team, NP and/or PA. I personally examined the patient. All nursing documentation, laboratory data, test results, and radiographs were reviewed and interpreted by me. I have established the management plan for this patient's critical illness and have been immediately available to assist with patient care. I agree with the database, findings, and plan of care recorded in the note by the resident physician, NP and/or PA.

## 2018-01-10 NOTE — Progress Notes (Signed)
ICU Daily Progress Note for Inpatients   LOS: 1 day       Interval History:   Underwent urgent HD    Subjective:   Feeling weak and tired all over    Objective Section:  Temp:  [36 C (96.8 F)-36.3 C (97.3 F)] 36.2 C (97.2 F)  Heart Rate:  [46-88] 85  Resp:  [10-22] 19  BP: (85-210)/(47-89) 116/59     Intake/Output Summary (Last 24 hours) at 01/10/18 0738  Last data filed at 01/10/18 0659   Gross per 24 hour   Intake          1029.39 ml   Output             1963 ml   Net          -933.61 ml     Vent Settings     Hemodynamics         Physical Exam:  GEN: resting in bed in NAD  HENT: mucous membranes moist without abnormalities  CVS: RRR, normal s1/s2, no m/r/g   PULM: clear to auscultation bilaterally with good inspiratory effort  ABD: bowel sounds present, soft, non tender, non distended   EXT: warm, dry, 2+ radial and dorsalis pedis pulses, no edema   NEURO: Awake, alert, and cooperative. Oriented x4. Speech is fluent, comprehension is intact.      Lab Results:  I've reviewed all of the patients labs, I've recorded the pertinent labs below.    Recent Labs  Lab 01/10/18  0058 01/09/18  1134   WBC 7.3 8.4   Hemoglobin 9.2* 10.0*   Hematocrit 27* 30*   Platelets 136* 150      Recent Labs  Lab 01/10/18  0058 01/09/18  2239 01/09/18  2108 01/09/18  1134   Sodium 137 136 137  --    Potassium 6.4* 6.6* 6.0*  --    Chloride 92* 92* 95*  --    CO2 33* 32* 34*  --    Magnesium 1.8  --   --  2.5       No components found with this basename: BUN, CREATININE, LABGLOM, GLUCOSE, CALCIUM, PHOS  No results for input(s): INR, PTT in the last 168 hours.    No components found with this basename: PT  No results for input(s): AST, ALT in the last 168 hours.    No components found with this basename: PROT, LABALBU, BILITOT, BILIDIR, ALKPHOS       Micro:  I've reviewed the patients microbiology data, I've recorded the pertinent information below.    Imaging: none    Active Hospital Medications:    b complex-vitamin c-folic acid  1  tablet Oral Daily    pantoprazole  40 mg Oral Daily    sevelamer  1,600 mg Oral TID WC    insulin lispro  0-30 Units Subcutaneous TID WC    insulin glargine  10 Units Subcutaneous Nightly       sodium chloride 5 mL/hr (01/10/18 0659)      HYDROcodone-acetaminophen, Nursing communication- Give 4 OZ of fruit juice for BG < 70 mg/dl **AND** dextrose **AND** dextrose **AND** glucagon **AND** POCT glucose     Assessment: Victor Flowers is a 72 y.o. male with PMH ESRD on HD, CAD, HTN, T2DM, prostate ca s/p TURP, FAP s/p subtotal colectomy presenting with severe hyperkalemia in the setting of missed dialysis now s/p urgent dialysis.     Plan:    Renal/E/F:    Hyperkalemia due  to missed HD  - K 9.6->6.4, phos 12.1->7.4 after HD yesterday, repeat today  - Nephrology consulted, s/p urgent HD 5/31, repeat today  - continue home sevelamer. Continue kayexalate on non-dialysis days (start tomorrow)  - telemetry    Pulm:   - no issues     CVS:   - bradycardia and peaked T waves resolved after HD, continue tele  - remains HDS    GI/Nutrition:   - low K, Low phos  - home PPI    ID:  - no issues     Heme:   - stable anemia of chronic disease likely, continue to monitor   - IPC for DVT ppx, h/o of HIT    Endo: T2DM  - Levemir 35 on hold due to hypoglycemia, hypoglycemia resolved, holding insulin until BG improves, still <100  - ISS    Neuro/pain:   - resume home gabapentin 300 daily, norco 5/325 PRN severe pain q8h    Integument:   - no issues     Should any home meds be restarted? no  Should any meds be d/ced? no    Are daily labs needed? yes    F: PO  E: daily    N: low K low phos    Prophylaxis: DVT: IPC, h/o possible HIT  Lines: PIV  Med Rec: completed      Disposition: MICU    CODE STATUS: Full Code     GOALS:   Medication reconciliation:    Should any home meds be restarted? yes  Should any meds be d/ced? no    Are daily labs needed? yes    Author: Liliana Cline, MD  as of: 01/10/2018 at: 7:38  AM

## 2018-01-10 NOTE — Progress Notes (Signed)
Report Given To  Patsy Lager, RN  Cherene Julian, RN      Descriptive Sentence / Reason for Admission   Victor Flowers is a 72 y.o. 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   Vacationing in [place] visiting two sisters. Missed HD x2 sessions. Received emergent HD, IV insulin/dextrose      Active Issues / Relevant Events   Nursing Progress Note: 98119- MICU: pt afebrile, VSS. BG checks q1H due to hypoglycemia per MD order. At 2030 Bg=68, given 8oz Orange Juice, repeat BG @ 2100 = 98. Repeat BG = 93. MD orders to hold Lantus overnight at this time.   BG this am <70, given juice and resolved with repeat BG to 95 then 103. Okay to check BG q6 this morning.  Post HD BMP sent from Right Fem introducer- Calcium value = 16.9. MD orders to send repeat BMP and Ca=9.3.  Will continue to monitor and intervene as necessary. Please refer to doc flowsheets for full assessment, vital signs, and interventions. Call bell within reach.  Deatra Ina, RN        To Do List  VS Q2H  BG q6h  HD 6/1 am    UTD 6/1 @ 0700      Anticipatory Guidance / Discharge Planning  Needs social work involvement--sisters dont think hes appropriate to go home alone    Ostomy consult to help with leakage issues--sisters requested    UTD 6/1 @ 0700

## 2018-01-10 NOTE — Plan of Care (Signed)
Brief Medicine Accept Note    Interval History  AVIGDOR DOLLAR is a 72 y.o. male with PMHx ESRD on HD, CAD, HTN, T2DM, prostate ca s/p TURP, FAP s/p subtotal colectomy who was admitted to MICU yesterday for urgent dialysis for severe hyperkalemia of 9.5 after missing 1-2 dialysis sessions. He is s/p dialysis with improvement in labs and EKG.     Subjective  Patient denies all complaints at this time. No chest pain, palpitations, shortness of breath, abdominal pain, nausea, vomiting, weakness or numbness.     Physical Examination  BP 90/50 (BP Location: Left arm)    Pulse 74    Temp 37.1 C (98.8 F) (Temporal)    Resp 18    Wt 68.5 kg (151 lb 0.2 oz)    SpO2 96%    BMI 22.37 kg/m     General: No acute distress  Cardiovascular: Regular rate and rhythm. No murmurs, rubs or gallops  Pulmonary: Clear to ascultation bilaterally. No wheezes, rales or rhonchi.  Abdominal: Normoactive bowel sounds. Non-distended. Non-tender to palpation. No rebound, guarding or rigidity. Colostomy.   Extremities. No edema bilateral lower extremities  Neurologic: Awake and alert. Oriented x 3. Moves all four extremities.     Laboratory results reviewed; today's results significant for K 6.4 (prior to dialysis)    Assessment  TYRION GLAUDE is a 72 y.o. male with PMHx ESRD on HD, CAD, HTN, T2DM, prostate ca s/p TURP, FAP s/p subtotal colectomy who was admitted to MICU yesterday for urgent dialysis for severe hyperkalemia of 9.5 after missing 1-2 dialysis sessions. He is s/p dialysis with improvement in labs and EKG. Now stabilized and being transferred to [facility] for further care.    Brief synopsis of plans  Hyperkalemia  - Kayexalate on non-dialysis days  - Dialysis   - Nephrology following; appreciate assistance  - Telemetry    IDDM2  - holding home insulin due to hypoglycemia     Minette Brine, MD PGY-2  Internal Medicine

## 2018-01-10 NOTE — Progress Notes (Signed)
Patient is scheduled for HD Monday 01/12/18 in the am. Please weigh patient before 7 am and serve early breakfast (if indicated) if possible. Thank you.      Maleyah Evans, RN

## 2018-01-10 NOTE — Progress Notes (Signed)
Report Given To   01/10/18  stable tx; pt demanded to abruptly stop tx 33 min early; pt denied pain or discomfort but did appear suddenly agitated and anxious, crying out to stop; 1600 ml fluid removed net       Descriptive Sentence / Reason for Admission   Duration of Treatment (minutes): 384 minutes  Complications: pt suddenly anxious and agitated during last 33 min; pt demanded to stop tx         Active Issues / Relevant Events         Dialysis weight change (kg): 68.5 kg  Review of medications administered  such as routine meds, antibiotics, and heparin: unable to admin epogen d/t abrupt termination of tx   Vital Signs: VSS        To Do List  Medications still needing administration: epogen sq if possible   Dressing change due : remove avf dressing after 6 hrs       Anticipatory Guidance / Discharge Planning  Next tx tbd

## 2018-01-11 ENCOUNTER — Inpatient Hospital Stay: Payer: Medicare (Managed Care)

## 2018-01-11 DIAGNOSIS — M25512 Pain in left shoulder: Secondary | ICD-10-CM

## 2018-01-11 LAB — RENAL FUNCTION PANEL
Albumin: 4.1 g/dL (ref 3.5–5.2)
Anion Gap: 14 (ref 7–16)
CO2: 29 mmol/L — ABNORMAL HIGH (ref 20–28)
Calcium: 9.5 mg/dL (ref 8.6–10.2)
Chloride: 92 mmol/L — ABNORMAL LOW (ref 96–108)
Creatinine: 6.66 mg/dL — ABNORMAL HIGH (ref 0.67–1.17)
GFR,Black: 9 * — AB
GFR,Caucasian: 8 * — AB
Glucose: 112 mg/dL — ABNORMAL HIGH (ref 60–99)
Lab: 21 mg/dL — ABNORMAL HIGH (ref 6–20)
Phosphorus: 6.5 mg/dL — ABNORMAL HIGH (ref 2.7–4.5)
Potassium: 6.4 mmol/L (ref 3.3–5.1)
Sodium: 135 mmol/L (ref 133–145)

## 2018-01-11 LAB — CBC AND DIFFERENTIAL
Baso # K/uL: 0 10*3/uL (ref 0.0–0.1)
Basophil %: 0.4 %
Eos # K/uL: 0.1 10*3/uL (ref 0.0–0.5)
Eosinophil %: 1.7 %
Hematocrit: 30 % — ABNORMAL LOW (ref 40–51)
Hemoglobin: 9.9 g/dL — ABNORMAL LOW (ref 13.7–17.5)
IMM Granulocytes #: 0 10*3/uL
IMM Granulocytes: 0.4 %
Lymph # K/uL: 1 10*3/uL — ABNORMAL LOW (ref 1.3–3.6)
Lymphocyte %: 13 %
MCH: 31 pg/cell (ref 26–32)
MCHC: 33 g/dL (ref 32–37)
MCV: 93 fL — ABNORMAL HIGH (ref 79–92)
Mono # K/uL: 1.2 10*3/uL — ABNORMAL HIGH (ref 0.3–0.8)
Monocyte %: 15 %
Neut # K/uL: 5.5 10*3/uL — ABNORMAL HIGH (ref 1.8–5.4)
Nucl RBC # K/uL: 0 10*3/uL (ref 0.0–0.0)
Nucl RBC %: 0 /100 WBC (ref 0.0–0.2)
Platelets: 140 10*3/uL — ABNORMAL LOW (ref 150–330)
RBC: 3.2 MIL/uL — ABNORMAL LOW (ref 4.6–6.1)
RDW: 15.6 % — ABNORMAL HIGH (ref 11.6–14.4)
Seg Neut %: 69.5 %
WBC: 7.9 10*3/uL (ref 4.2–9.1)

## 2018-01-11 LAB — TSH: TSH: 0.46 u[IU]/mL (ref 0.27–4.20)

## 2018-01-11 LAB — POCT GLUCOSE
Glucose POCT: 111 mg/dL — ABNORMAL HIGH (ref 60–99)
Glucose POCT: 153 mg/dL — ABNORMAL HIGH (ref 60–99)
Glucose POCT: 177 mg/dL — ABNORMAL HIGH (ref 60–99)
Glucose POCT: 170 mg/dL — ABNORMAL HIGH (ref 60–99)

## 2018-01-11 LAB — MAGNESIUM: Magnesium: 2 mg/dL (ref 1.6–2.5)

## 2018-01-11 LAB — URIC ACID: Urate: 3.5 mg/dL — ABNORMAL LOW (ref 3.9–9.0)

## 2018-01-11 LAB — POTASSIUM: Potassium: 5.1 mmol/L (ref 3.3–5.1)

## 2018-01-11 MED ORDER — DEXTROSE 50 % IV SOLN *I*
25.0000 g | INTRAVENOUS | Status: DC | PRN
Start: 2018-01-11 — End: 2018-01-28
  Administered 2018-01-12: 25 g via INTRAVENOUS
  Filled 2018-01-11: qty 50

## 2018-01-11 MED ORDER — TIMOLOL MALEATE 0.25 % OP SOLN WRAPPED *I*
1.0000 [drp] | Freq: Two times a day (BID) | OPHTHALMIC | Status: DC
Start: 2018-01-11 — End: 2018-01-31
  Administered 2018-01-11 – 2018-01-31 (×39): 1 [drp] via OPHTHALMIC
  Filled 2018-01-11: qty 5

## 2018-01-11 MED ORDER — GLUCOSE 40 % PO GEL *I*
15.0000 g | ORAL | Status: DC | PRN
Start: 2018-01-11 — End: 2018-01-28

## 2018-01-11 MED ORDER — LATANOPROST 0.005 % OP SOLN *I*
1.0000 [drp] | Freq: Every evening | OPHTHALMIC | Status: DC
Start: 2018-01-11 — End: 2018-01-28
  Administered 2018-01-12 – 2018-01-27 (×17): 1 [drp] via OPHTHALMIC
  Filled 2018-01-11 (×3): qty 2.5

## 2018-01-11 MED ORDER — GLUCAGON HCL (RDNA) 1 MG IJ SOLR *WRAPPED*
1.0000 mg | INTRAMUSCULAR | Status: DC | PRN
Start: 2018-01-11 — End: 2018-01-28

## 2018-01-11 MED ORDER — DORZOLAMIDE HCL 2 % OP SOLN *I*
1.0000 [drp] | Freq: Two times a day (BID) | OPHTHALMIC | Status: DC
Start: 2018-01-11 — End: 2018-01-28
  Administered 2018-01-11 – 2018-01-28 (×33): 1 [drp] via OPHTHALMIC
  Filled 2018-01-11: qty 10

## 2018-01-11 NOTE — Progress Notes (Signed)
01/11/18 1000   [facility] Patient Class Review   Patient Class Review Inpatient   Patient Class Effective 01/09/2018  Cottrell Gentles RN   Utilization  Management  X [phone]  Page [phone]

## 2018-01-11 NOTE — Progress Notes (Signed)
Nephrology Attending    I have seen and evaluated Victor Flowers for ESRD needs.  Summary of recommendations are as follows:    ____________________________________________________________________________________________________  1) ESRD- Had HD Friday and Saturday, yet K remains high.  Unclear to me why this is.  We will plan on HD again today.    2) Hyperkalemia- low K diet.  We will plan HD on a 1K bath today.  Would plan for fistulagram tomorrow to check access to ensure proper function and rule this out as a high K.  I don't think it is intracellular in origin, as the phos is improved.    Can check urate as another intracellular compound, and if this is very high, might suggest cell break down, but there is no overt evidence of this.    3) Anemia- Epogen for anemia of ESRD.  Would check iron studies.    4) Hyperphosphatemia- Improving with HD and Renvela    5) He has a high bicarb-as previous, can check blood gas to see pH.  WE'll run against a 30 bicarb    6) BP has been labile, does not need BP meds now.  Scheduled Meds:   sevelamer  1.6 g Oral TID WC    gabapentin  300 mg Oral Daily    b complex-vitamin c-folic acid  1 tablet Oral Daily    pantoprazole  40 mg Oral Daily    insulin lispro  0-30 Units Subcutaneous TID WC    insulin glargine  10 Units Subcutaneous Nightly     Continuous Infusions:   sodium chloride Stopped (01/10/18 1206)     PRN Meds:.HYDROcodone-acetaminophen, Nursing communication- Give 4 OZ of fruit juice for BG < 70 mg/dl **AND** dextrose **AND** dextrose **AND** glucagon **AND** POCT glucose    _____________________________________________________________________________________________________  He is tired, has no CP, SOB, N/V  States his K does run high in [place]  Blood pressure 121/56, pulse 86, temperature 36.8 C (98.2 F), temperature source Temporal, resp. rate 18, weight 64.4 kg (141 lb 14.4 oz), SpO2 97 %.  BP: (90-144)/(50-73)   Temp:  [36.2 C (97.2 F)-37.1 C  (98.8 F)]   Temp src: Temporal (06/02 0717)  Heart Rate:  [74-99]   Resp:  [12-22]   SpO2:  [95 %-100 %]   Weight:  [64.4 kg (141 lb 14.4 oz)-70.1 kg (154 lb 8.7 oz)]     I/O last 3 completed shifts:  06/01 0700 - 06/02 0659  In: 675 (10.5 mL/kg) [P.O.:240; I.V.:435 (0.3 mL/kg/hr)]  Out: 2675 (41.6 mL/kg) [Other:2275; Stool:400]  Net: -2000  Weight: 64.4 kg   I/O this shift:  06/02 0700 - 06/02 1459  In: 150 (2.3 mL/kg) [P.O.:150]  Out: - (0 mL/kg)   Net: 150  Weight: 64.4 kg     Gen- Looks tired  Eyes- no icterus  Mucous membranes are dry  Cor- Regular rate and rhythm, no MRG  Lungs- Clear to auscultation bilaterally, good respiratory effort  Abd- Soft, Non tender, non distended  Ext- No edema  Neuro- The patient is awake, alert  Skin- The visible skin has no rashes.  AVG with good thrill      Recent Labs  Lab 01/11/18  0120 01/10/18  0058 01/09/18  2239  01/09/18  1134   Sodium 135 137 136  < >  --    Potassium 6.4* 6.4* 6.6*  < >  --    Chloride 92* 92* 92*  < >  --    CO2  29* 33* 32*  < >  --    UN 21* 29* 27*  < >  --    Creatinine 6.66* 8.70* 8.24*  < >  --    Glucose 112* 80 95  < >  --    Albumin 4.1 3.7  --   --   --    Phosphorus 6.5* 7.4*  --   --  12.1*   Calcium 9.5 9.3 9.7  < > 9.9   < > = values in this interval not displayed.     Recent Labs  Lab 01/11/18  0120 01/10/18  0058 01/09/18  1134   WBC 7.9 7.3 8.4   Hemoglobin 9.9* 9.2* 10.0*   Hematocrit 30* 27* 30*   Platelets 140* 136* 150          No results found.           Assessment:    Victor Flowers is a 72 y.o. 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  His K is down, but  still high, unclear why.    Plan as above.    Victor Montane, MD 01/11/2018 9:00 AM

## 2018-01-11 NOTE — Plan of Care (Addendum)
Psychosocial     Demonstrates ability to cope with illness Goal not met          Cognitive function     Cognitive function will be maintained or return to baseline Maintaining        Nutrition     Patient's nutritional status is maintained or improved Maintaining        Pain/Comfort     Patient's pain or discomfort is manageable Maintaining        Safety     Patient will remain free of falls Maintaining     Prevent any intentional injury Maintaining          Mobility     Patient's functional status is maintained or improved Progressing towards goal          Writer assumed care of patient at 2300. Patient was alert and oriented x3, on RA, VSS.     Overnight the patient removed his IV access that was placed in his neck, the patient did not have any blood loss from this. The patient remained stable.    Overnight labs were drawn and the patient was found to have a critical potassium as documented in the lab sheets. APP was notified.      Patient denies complaints overnight, is currently resting comfortably.     VSS. Will continue to monitor.     Theodoro Doing, RN

## 2018-01-11 NOTE — Progress Notes (Addendum)
Hospital Medicine Service Attending Progress Note    Significant Events/Subjective:   Seen on HD.  Denies SOB or pain during my interview, but per NP he reported L shoulder pain.    Sister Victor Flowers at bedside, provided additional history. Patient lives alone in [place] but has been staying with her for a prolonged visit.  He arranged to get HD in [place] [facility]), but flew back to [facility] and didn't get his HD for 5 days.  Victor Flowers states that his memory and organizational skills have declined over the past few years; he doesn't bathe/shower, and left his medications in [place] (a friend mailed them to him).  He's nonadherent to his dietary restrictions, though she tries to learn and follow them when she cooks.    Objective:     Physical Exam  Temp:  [36 C (96.8 F)-37.1 C (98.8 F)] 36 C (96.8 F)  Heart Rate:  [52-90] 69  Resp:  [11-22] 11  BP: (86-144)/(46-68) 107/52  64.4 kg (141 lb 15.6 oz)     Constitutional: laying in bed on HD in NAD  Neck: supple  Eyes: anicteric  CV: Reg, normal S1 S2  Resp: normal respiratory effort at rest; CTA bilaterally laterally  GI: soft NT ND + ostomy with stool  Neuro: wakens to voice but very drowsy, easily distracted, poor concentration  Psych: flat affect    Recent Lab Studies:  Personally reviewed and notable for:    Recent Labs  Lab 01/11/18  0120 01/10/18  0058 01/09/18  1134   WBC 7.9 7.3 8.4   Hemoglobin 9.9* 9.2* 10.0*   Hematocrit 30* 27* 30*   Platelets 140* 136* 150        Recent Labs  Lab 01/11/18  0120 01/10/18  0058 01/09/18  2239  01/09/18  1134   Sodium 135 137 136  < >  --    Potassium 6.4* 6.4* 6.6*  < >  --    Chloride 92* 92* 92*  < >  --    CO2 29* 33* 32*  < >  --    UN 21* 29* 27*  < >  --    Creatinine 6.66* 8.70* 8.24*  < >  --    Glucose 112* 80 95  < >  --    Calcium 9.5 9.3 9.7  < > 9.9   Albumin 4.1 3.7  --   --   --    Phosphorus 6.5* 7.4*  --   --  12.1*   < > = values in this interval not displayed.       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  Dialyzed multiple times, but still hyperkalemic (6.4). It's unclear why his potassium remains elevated.  Plans:   Severe hyperkalemia, ESKD  - recheck K after dialysis today  - low potassium diet  - nutrition education  - fistulogram tomorrow to ensure it is functioning appropriately  - appreciate nephrology input  - continue sevelamer    HTN- Per review of PCP notes from [facility], no CAD documented; HLP was diet controlled  - continue off meds    DM2  - continue glargine 10 units qhs  - monitor BGs    Possible cognitive decline with superimposed encephalopathy  - check Vitamin B12 and TSH (ordered)  - hold gapapentin  - PT and OT evaluations once more alert    Glaucoma L eye  - restart latanoprost and cosopt    Shoulder pain  - check x-ray    DVT PPx: SCDs (allergic to heparin)    Code Status: Full    Discharge Planning:   Est. Discharge Date (EDD): TBD    Discharge Criteria/Barriers to Discharge: encephalopathy improved, Potassium normalized  PT and/or OT Recommendations/Discharge to: Pending  Appointments Needed with: Nephrology     Case discussed with Lannie Fields NP  Sister updated at bedside    Victor Libra, MD on 01/11/2018 at 12:04 PM

## 2018-01-11 NOTE — Progress Notes (Addendum)
Hospital Medicine APP Daily Progress note    Patient seen & chart reviewed.  Underwent dialysis today which he tolerated well.  Patient was seen following dialysis and was too tired to communicate with me.  When name called, he only reported "What" and "yep" when asking him if he is doing well.  Per nursing, patient has been reporting left shoulder pain for 3 days.  Due to fatigue from dialysis, not able to assess this at this time and will need to try when patient is more alert and interactive.      Vital Signs:  Temp:  [36 C (96.8 F)-37.1 C (98.8 F)] 36 C (96.8 F)  Heart Rate:  [52-90] 70  Resp:  [11-22] 18  BP: (86-137)/(46-67) 129/54    Physical Exam:  General: Sleeping when entered room.  Opens eyes slightly when name called.  Does not want to interact when spoken to  Lungs: Clear throughout.  Respirations easy  Heart: RRR. S1S2 audible.  No R/G/M  Abd: Soft, non-distended, normoactive bowel sounds.  Remainder of exam limited due to patient sleeping  Ext: no peripheral edema noted      Labs: reviewed      Recent Labs  Lab 01/11/18  0120 01/10/18  0058 01/09/18  1134   WBC 7.9 7.3 8.4   Hemoglobin 9.9* 9.2* 10.0*   Hematocrit 30* 27* 30*   Platelets 140* 136* 150         Lab results: 01/11/18  0120 01/10/18  0058 01/09/18  2239   Sodium 135 137 136   Potassium 6.4* 6.4* 6.6*   Chloride 92* 92* 92*   CO2 29* 33* 32*   UN 21* 29* 27*   Creatinine 6.66* 8.70* 8.24*   GFR,Caucasian 8* 5* 6*   GFR,Black 9* 6* 7*   Glucose 112* 80 95   Calcium 9.5 9.3 9.7       No results for input(s): ALT, AST in the last 168 hours.    No components found with this basename: ALKPHOS, BILITOT, BILIDIR, PROT, LABALBU  No results for input(s): INR in the last 168 hours.      Recent Labs  Lab 01/11/18  1415 01/11/18  0957 01/10/18  2011 01/10/18  1154 01/10/18  0802 01/10/18  0625 01/10/18  0437 01/10/18  0330   Glucose POCT 153* 111* 155* 125* 91 103* 95 68       A/P:   Victor Flowers a 72 y.o.malewith PMH ESRD on HD, CAD,  HTN, T2DM, prostate ca s/p TURP, FAP s/p subtotal colectomy presenting with severe hyperkalemia in the setting of missed dialysis now s/p urgent dialysis.    # Hyperkalemia  - Patient received dialysis again today per nephrology  - Will check uric acid level per nephrology  - Check fistulogram tomorrow per nephrology    # Left Shoulder pain  - Unclear etiology and not able to complete full assessment due to patient's fatigue.  - Will check shoulder Xrays today however will need full assessment once patient more awake  - Will hold opioids at this time due to lethargy     DVT prophylaxis: SCDs as patient has a heparin allergy and is ESRD for Lovenox  Dispo: Will need to determine once medically ready as patient lives in [place] and there are concerns regarding his ability to care for himself.  Will need PT/OT consult once more awake in the next 1 - 2 days    Victor Flowers Peri Jefferson, NP  3:24 PM  01/11/2018    Prescriptions Prior to Admission   Medication Sig    sodium polystyrene (KAYEXALATE) 15 GM/60ML suspension Take 30 g by mouth three times a week   Take if directed by MD for elevated potassium.    gabapentin (NEURONTIN) 300 MG capsule Take 300 mg by mouth daily    hydrOXYzine HCl (ATARAX) 10 MG tablet Take 10 mg by mouth nightly as needed for Anxiety    pantoprazole (PROTONIX) 40 MG EC tablet Take 40 mg by mouth daily   Swallow whole. Do not crush, break, or chew.    sevelamer (RENVELA) 800 MG tablet Take 1,600 mg by mouth 3 times daily (with meals)   Swallow whole. Do not crush, break, or chew.    B Complex-C-Folic Acid (RENAL-VITE) 0.8 MG TABS Take 0.8 mg by mouth at bedtime    HYDROcodone-acetaminophen (NORCO) 5-325 MG per tablet Take 1 tablet by mouth every 8 hours as needed for Pain    insulin detemir (LEVEMIR) 100 UNIT/ML injection vial Inject 35 Units into the skin daily   Maximum  46  Units/day     dorzolamide-timolol (COSOPT) 22.3-6.8 MG/ML ophthalmic solution Place 1 drop into the left eye 2  times daily    insulin lispro 100 UNIT/ML injection vial Inject 12 Units into the skin 3 times daily (before meals)   Maximum  36  Units/day    latanoprost (XALATAN) 0.005 % ophthalmic solution Place 1 drop into the left eye nightly    lidocaine-prilocaine (EMLA) cream Apply topically three times a week   Apply to fistula area 30-35min prior to HD    multi-vitamin (MULTIVITAMIN) per tablet Take 1 tablet by mouth daily     Scheduled Meds:   sevelamer  1.6 g Oral TID WC    gabapentin  300 mg Oral Daily    b complex-vitamin c-folic acid  1 tablet Oral Daily    pantoprazole  40 mg Oral Daily    insulin lispro  0-30 Units Subcutaneous TID WC    insulin glargine  10 Units Subcutaneous Nightly

## 2018-01-11 NOTE — ED Notes (Signed)
Procedure: Emergency Medicine Training Ultrasound    Procedure performed by Nyoka Lint, MD   Date: 01/09/18       Type  After consent was obtained from the patient, non-diagnostic, training ED ultrasound examinations: aorta were performed       Nyoka Lint, MD  Resident  01/11/18 [phone]

## 2018-01-11 NOTE — Progress Notes (Signed)
Report Given To  01/11/18  Pt received 2 hrs of Hd tx due to high Potassium(K+ 6.4)the patient was dialyzed against 1K  bath.Tolerated Hd well.Net UF 0.2L      Descriptive Sentence / Reason for Admission   Duration of Treatment (minutes): 656 minutes  Complications: asymptomatic hypotension;venous chamber clotted#1,tx was restarted        Active Issues / Relevant Events         Dialysis weight change (kg): 68.5 kg  Review of medications administered  such as routine meds, antibiotics, and heparin: none  Vital Signs: see HD flow sheet      To Do List  Medications still needing administration: none related to HD   Dressing change due : Please remove dressing from the Right AVG approximately 6 hrs post Hd      Anticipatory Guidance / Discharge Planning  Date/Time of Next Treatment: 01/12/18.Please weight pt daily.Thank you.

## 2018-01-11 NOTE — Consults (Signed)
Attempted to see pt, in BR then going to dialysis. Staff report Corliss Parish pouch is covered with ostomy pouching system and is putting out stool.  Will attempt to see tomorrow for further assessment.    Gonzella Lex MS RN [facility]  Pager [phone]

## 2018-01-12 ENCOUNTER — Inpatient Hospital Stay: Payer: Medicare (Managed Care) | Admitting: Radiology

## 2018-01-12 DIAGNOSIS — K219 Gastro-esophageal reflux disease without esophagitis: Secondary | ICD-10-CM | POA: Insufficient documentation

## 2018-01-12 DIAGNOSIS — E785 Hyperlipidemia, unspecified: Secondary | ICD-10-CM | POA: Insufficient documentation

## 2018-01-12 DIAGNOSIS — R531 Weakness: Secondary | ICD-10-CM | POA: Diagnosis present

## 2018-01-12 DIAGNOSIS — I2510 Atherosclerotic heart disease of native coronary artery without angina pectoris: Secondary | ICD-10-CM | POA: Insufficient documentation

## 2018-01-12 LAB — TIBC
Iron: 43 ug/dL — ABNORMAL LOW (ref 45–170)
TIBC: 221 ug/dL — ABNORMAL LOW (ref 250–450)
Transferrin Saturation: 19 % — ABNORMAL LOW (ref 20–55)

## 2018-01-12 LAB — CBC AND DIFFERENTIAL
Baso # K/uL: 0 10*3/uL (ref 0.0–0.1)
Basophil %: 0.4 %
Eos # K/uL: 0.5 10*3/uL (ref 0.0–0.5)
Eosinophil %: 6 %
Hematocrit: 30 % — ABNORMAL LOW (ref 40–51)
Hemoglobin: 9.9 g/dL — ABNORMAL LOW (ref 13.7–17.5)
IMM Granulocytes #: 0 10*3/uL
IMM Granulocytes: 0.2 %
Lymph # K/uL: 1.4 10*3/uL (ref 1.3–3.6)
Lymphocyte %: 16.2 %
MCH: 31 pg/cell (ref 26–32)
MCHC: 33 g/dL (ref 32–37)
MCV: 94 fL — ABNORMAL HIGH (ref 79–92)
Mono # K/uL: 1 10*3/uL — ABNORMAL HIGH (ref 0.3–0.8)
Monocyte %: 11.8 %
Neut # K/uL: 5.5 10*3/uL — ABNORMAL HIGH (ref 1.8–5.4)
Nucl RBC # K/uL: 0 10*3/uL (ref 0.0–0.0)
Nucl RBC %: 0 /100 WBC (ref 0.0–0.2)
Platelets: 134 10*3/uL — ABNORMAL LOW (ref 150–330)
RBC: 3.2 MIL/uL — ABNORMAL LOW (ref 4.6–6.1)
RDW: 15.7 % — ABNORMAL HIGH (ref 11.6–14.4)
Seg Neut %: 65.4 %
WBC: 8.4 10*3/uL (ref 4.2–9.1)

## 2018-01-12 LAB — PROTIME-INR
INR: 1 (ref 0.9–1.1)
Protime: 11.2 s (ref 10.0–12.9)

## 2018-01-12 LAB — MAGNESIUM: Magnesium: 2.1 mg/dL (ref 1.6–2.5)

## 2018-01-12 LAB — BASIC METABOLIC PANEL
Anion Gap: 13 (ref 7–16)
CO2: 30 mmol/L — ABNORMAL HIGH (ref 20–28)
Calcium: 9.7 mg/dL (ref 8.6–10.2)
Chloride: 89 mmol/L — ABNORMAL LOW (ref 96–108)
Creatinine: 7.58 mg/dL — ABNORMAL HIGH (ref 0.67–1.17)
GFR,Black: 7 * — AB
GFR,Caucasian: 6 * — AB
Glucose: 92 mg/dL (ref 60–99)
Lab: 25 mg/dL — ABNORMAL HIGH (ref 6–20)
Potassium: 5.4 mmol/L — ABNORMAL HIGH (ref 3.3–5.1)
Sodium: 132 mmol/L — ABNORMAL LOW (ref 133–145)

## 2018-01-12 LAB — FERRITIN: Ferritin: 659 ng/mL — ABNORMAL HIGH (ref 20–250)

## 2018-01-12 LAB — TRANSFERRIN: Transferrin: 164 mg/dL — ABNORMAL LOW (ref 200–360)

## 2018-01-12 LAB — VITAMIN B12: Vitamin B12: 312 pg/mL (ref 232–1245)

## 2018-01-12 LAB — POCT GLUCOSE
Glucose POCT: 81 mg/dL (ref 60–99)
Glucose POCT: 177 mg/dL — ABNORMAL HIGH (ref 60–99)
Glucose POCT: 64 mg/dL (ref 60–99)
Glucose POCT: 273 mg/dL — ABNORMAL HIGH (ref 60–99)
Glucose POCT: 112 mg/dL — ABNORMAL HIGH (ref 60–99)

## 2018-01-12 LAB — LACTATE DEHYDROGENASE: LD: 145 U/L (ref 118–225)

## 2018-01-12 LAB — PHOSPHORUS: Phosphorus: 7.3 mg/dL — ABNORMAL HIGH (ref 2.7–4.5)

## 2018-01-12 MED ORDER — INSULIN GLARGINE 100 UNIT/ML SC SOLN *WRAPPED*
7.0000 [IU] | Freq: Every evening | SUBCUTANEOUS | Status: DC
Start: 2018-01-12 — End: 2018-01-12

## 2018-01-12 MED ORDER — MELATONIN 3 MG PO TABS *I*
3.0000 mg | ORAL_TABLET | Freq: Once | ORAL | Status: AC
Start: 2018-01-12 — End: 2018-01-12
  Administered 2018-01-12: 3 mg via ORAL
  Filled 2018-01-12: qty 1

## 2018-01-12 MED ORDER — INSULIN GLARGINE 100 UNIT/ML SC SOLN *WRAPPED*
7.0000 [IU] | Freq: Every evening | SUBCUTANEOUS | Status: DC
Start: 2018-01-13 — End: 2018-01-13

## 2018-01-12 MED ORDER — ACETAMINOPHEN 325 MG PO TABS *I*
650.0000 mg | ORAL_TABLET | Freq: Four times a day (QID) | ORAL | Status: DC | PRN
Start: 2018-01-12 — End: 2018-01-28
  Administered 2018-01-14 – 2018-01-27 (×17): 650 mg via ORAL
  Filled 2018-01-12 (×18): qty 2

## 2018-01-12 NOTE — Consults (Signed)
Ostomy Nurse Note        Stoma assessment:  Type:Kock pouch  Location: RLQ  Color: red  Size: unable to measure  Protrusion:  Flush- pt states it does stick out at times  Peristomal skin condition: wafer intact  Output: liquid stool    Ostomy products/equipment:  Hollister 2 3/4" skin barrier wafer  Hollister 2 3/4" pouch  Accessories: none    Pt has been independent in the care of his Corliss Parish pouch for many years. He is currently c/o pain and difficulty intubating, attempted to digitalize stoma pt c/o pain as nurses finger was advancing through the track.    Pt uses a 30 Fr. Catheter to intubate with-     Questions answered and patient verbalized understanding.    Recommendations:  Ordered 30 Fr catheters from hospital stores  Discussed with MA Terbos NP- referral to Colorectal surgery due to pt's c/o pain and difficulty intubating stoma.        Gonzella Lex MS RN [facility]  Pager [phone]

## 2018-01-12 NOTE — Progress Notes (Signed)
Patient seen & chart reviewed  Had hemodialysis this am  C/o feeling very weak and tired - has felt this way for a long time  Entered room when wOCN was there evaluating his Kock pouch- c/o pain with stimulation- has had pain for a while  No dizziness, no nausea  Vitals:    01/12/18 1205 01/12/18 1216 01/12/18 1219 01/12/18 1444   BP: (!) 90/46 (!) 79/36 (!) 118/48 114/56   BP Location:    Left arm   Pulse: 70 76 74 58   Resp: 14 15 16 16    Temp:   35.7 C (96.3 F) 36.3 C (97.3 F)   TempSrc:    Temporal   SpO2: 100% 100% 100% 99%   Weight:   64.5 kg (142 lb 3.2 oz)    Height:           Exam:  General: alert oriented x 3, slow response to questions asked  Lungs: clear  Heart: RRR  Abd: +BS soft + generalized tenderness all 4 quadrants- ostomy intact     Ext: no edema, right AVF + bruit and thrill      Labs: reviewed      Recent Labs  Lab 01/12/18  0537 01/11/18  0120 01/10/18  0058   WBC 8.4 7.9 7.3   Hemoglobin 9.9* 9.9* 9.2*   Hematocrit 30* 30* 27*   Platelets 134* 140* 136*         Lab results: 01/12/18  0537 01/11/18  2042 01/11/18  0120 01/10/18  0058   Sodium 132*  --  135 137   Potassium 5.4* 5.1 6.4* 6.4*   Chloride 89*  --  92* 92*   CO2 30*  --  29* 33*   UN 25*  --  21* 29*   Creatinine 7.58*  --  6.66* 8.70*   GFR,Caucasian 6*  --  8* 5*   GFR,Black 7*  --  9* 6*   Glucose 92  --  112* 80   Calcium 9.7  --  9.5 9.3         Recent Labs  Lab 01/12/18  0537   INR 1.0         Recent Labs  Lab 01/12/18  1324 01/12/18  1246 01/12/18  0956 01/11/18  2116 01/11/18  1918 01/11/18  1415 01/11/18  0957 01/10/18  2011   Glucose POCT 177* 34 81 170* 177* 153* 111* 155*       A/P: 72 year old male with PMH significant for CKD V on HD, FAP with ostomy [name] [phone]), type II DM (now well controlled), Barrett;s esophagus, HTN, prostate cancer with TURP presented with severe hyperkalemia- K 9.5 on admit with EKG changes- due to missed HD    CKD V/hyperkalemia: had HD today, d/w IR fellow earlier today- requested  [place] AVF prior to fistulogram- ordered- have postponed fistulogram until tomorrow as was at HD till early in afternoon delaying Korea- of note was using kayexelate prn as outpatient    Type II DM: per chart review on levemir at home 35 units- here ordered for 10 units lantus- and BG's significantly dropped overnight- will decrease to 7 units starting tomorrow as NPO after MN tonight- lispro SSI without NB  ( of note his HgA1c in March was only 5.6)    HTN: currently not on meds as outpatient    Cognitive decline: TSH, B12 WNL, seen by OT recommending SNF rehab- holding neurontin SW aware- ? Refer to [facility]  adults clinic as outpatient    Painful cannulation of Kock pouch: CRS consult called they will see him     Nutrition: low salt low potassium diet- as with low BG's hold diabetic restrictions    DVT prophylaxis: SCD's as allergic to heparin    Dispo: likely SNF rehab    Cornelia Copa, NP  4:26 PM  01/12/2018    Prescriptions Prior to Admission   Medication Sig    sodium polystyrene (KAYEXALATE) 15 GM/60ML suspension Take 30 g by mouth three times a week   Take if directed by MD for elevated potassium.    gabapentin (NEURONTIN) 300 MG capsule Take 300 mg by mouth daily    hydrOXYzine HCl (ATARAX) 10 MG tablet Take 10 mg by mouth nightly as needed for Anxiety    pantoprazole (PROTONIX) 40 MG EC tablet Take 40 mg by mouth daily   Swallow whole. Do not crush, break, or chew.    sevelamer (RENVELA) 800 MG tablet Take 1,600 mg by mouth 3 times daily (with meals)   Swallow whole. Do not crush, break, or chew.    B Complex-C-Folic Acid (RENAL-VITE) 0.8 MG TABS Take 0.8 mg by mouth at bedtime    HYDROcodone-acetaminophen (NORCO) 5-325 MG per tablet Take 1 tablet by mouth every 8 hours as needed for Pain    insulin detemir (LEVEMIR) 100 UNIT/ML injection vial Inject 35 Units into the skin daily   Maximum  46  Units/day     dorzolamide-timolol (COSOPT) 22.3-6.8 MG/ML ophthalmic solution Place 1 drop into the left eye 2  times daily    insulin lispro 100 UNIT/ML injection vial Inject 12 Units into the skin 3 times daily (before meals)   Maximum  36  Units/day    latanoprost (XALATAN) 0.005 % ophthalmic solution Place 1 drop into the left eye nightly    lidocaine-prilocaine (EMLA) cream Apply topically three times a week   Apply to fistula area 30-12min prior to HD    multi-vitamin (MULTIVITAMIN) per tablet Take 1 tablet by mouth daily     Scheduled Meds:   [START ON 01/13/2018] insulin glargine  7 Units Subcutaneous Nightly    dorzolamide  1 drop Left Eye 2 times per day    timolol  1 drop Left Eye 2 times per day    latanoprost  1 drop Left Eye Nightly    sevelamer  1.6 g Oral TID WC    b complex-vitamin c-folic acid  1 tablet Oral Daily    pantoprazole  40 mg Oral Daily    insulin lispro  0-30 Units Subcutaneous TID WC

## 2018-01-12 NOTE — Plan of Care (Signed)
Problem: Impaired ADL  Goal: Increase ADL Independence    Intervention: ADL Retraining      LTG:  Patient will perform UE dressing with setup, vc's, cg assistance in 2-7 days.        LTG:  Patient will perform LE dressing with setup, cg  assistance in 2-8days.      LTG:  Patient will perform toileting skills with min assistance in 2-8days.

## 2018-01-12 NOTE — Progress Notes (Signed)
Hospital Medicine Service Attending Progress Note    Significant Events/Subjective:   Patient complains that he feels weak.  He wants to know when he can eat again.    Objective:     Physical Exam  Temp:  [35.9 C (96.6 F)-37 C (98.6 F)] 35.9 C (96.6 F)  Heart Rate:  [52-86] 61  Resp:  [11-21] 21  BP: (86-137)/(44-61) 110/56  64.5 kg (142 lb 1.6 oz)    Constitutional: sitting up in bed, in no acute distress   HENT: anicteric, severe clouding of right eye  CV: RRR  Resp: normal work of breathing, on room air, lungs CTAB  GI: soft, non-tender  Skin/Lines: warm, dry, RUE AVF  Neuro: awake, alert    Recent Lab, Micro, and Imaging Studies   Personally reviewed and notable for:    K 5.4  CO2 30  Mag 2.1  Phos 7.3    Left shoulder x-rays: No acute fracture or dislocation involving the left shoulder. Endograft stent in the aortic arch and proximal descending thoracic aorta.    Current Inpatient Medications:   dorzolamide  1 drop Left Eye 2 times per day    timolol  1 drop Left Eye 2 times per day    latanoprost  1 drop Left Eye Nightly    sevelamer  1.6 g Oral TID WC    b complex-vitamin c-folic acid  1 tablet Oral Daily    pantoprazole  40 mg Oral Daily    insulin lispro  0-30 Units Subcutaneous TID WC    insulin glargine  10 Units Subcutaneous Nightly     Nursing communication- Give 4 OZ of fruit juice for BG < 70 mg/dl **AND** dextrose **AND** dextrose **AND** glucagon **AND** POCT glucose    Assessment:     72 y.o. male with a history of ESRD on HD, HTN, DM2, prostate ca s/p TURP, FAP s/p subtotal colectomy, GERD c/b Barrett esophagus who presented with severe hyperkalemia in the setting of missed hemodialysis.    Plans:     ESRD/hyperkalemia/hyperphosphatemia:  - Etiology of hyperkalemia appears missed HD and poor dietary adherence.  - K trending down with dietary restriction and multiple HD sessions.  - AV fistula ultrasound pending.  Possible fistulogram.  - Appreciate nephrology assistance.    HTN:  -  By history.  He does not appear to be on antihypertensive meds.  Patient with intermittent hypotension with HD.  Would not plan to start BP meds.    DM2:  - Having relatively low BGs despite significantly decreased insulin doses compared to home regimen.  - Decrease Lantus further to 7u nightly.  On 35u Levemir at home.  - Continue Humalog TID AC with 0u baseline.  On 12u baseline at home.  - Concern for excessively strict glycemic control at home likely causing hypoglycemia.  A1c in March (via [facility]) of 5.6.    Possible cognitive decline:  - B12 and TSH wnl.  - Holding gabapentin.  - PT/OT.    Glaucoma:  - Continue gtts.    GERD/Barrett esophagus:  - Continue PPI.    FAP s/p colectomy:  - Patient having pain with catheterization of Kock pouch.  - Appreciate WOCN assistance.  - CRS consult.    Left shoulder pain:  - Negative x-rays.    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 Discharge Date (EDD): 6/5   Discharge Criteria/Barriers to Discharge: stable on  HD, fistulogram, possible placement  PT and/or OT Recommendations/Discharge to: SNF  Appointments Needed with: TBD     Case discussed with APP, RN, SW.    Wynona Meals, MD on 01/12/2018 at 9:04 AM

## 2018-01-12 NOTE — Progress Notes (Deleted)
Report Given To  01/12/18  Whitney RN  Patient tolerated 3 hours 45 minutes of HD treatment, ended tx 15 minutes early d/t low  B/P Gross, NP aware. No fluid removal per Dr. Williams Che request.      Descriptive Sentence / Reason for Admission   Duration of Treatment (minutes): 076 minutes  Complications: Low B/P        Active Issues / Relevant Events   Dialysis weight change (kg): 0.1 kg  Review of medications administered  such as routine meds, antibiotics, and heparin: none  Vital Signs: see flow sheet        To Do List  Medications still needing administration: none dialysis  Dressing change due : Please remove dressing from the Right AVG approximately 6 hrs post HD treatment.      Anticipatory Guidance / Discharge Planning  Date/Time of Next Treatment: 01/14/18  Weigh patient daily in order to monitor fluid balance.

## 2018-01-12 NOTE — Progress Notes (Addendum)
Nephrology  01/12/2018 10:10 AM    Victor Flowers was seen on dialysis.    Interval Events:    No acute events overnight.  Remains hyperkalemic and hyperphosphatemic.  Patient reports eating well and likely dietary indiscretion.      Objective:  Blood pressure (!) 86/60, pulse 67, temperature 35.9 C (96.6 F), resp. rate 12, height 1.829 m (6'), weight 64.5 kg (142 lb 1.6 oz), SpO2 99 %.    KGU:RKYHC, alert, oriented ; lucid; NAD  Chest:  Clear with normal effort  CVS: reg; no rub; no edema; An AVF is presents and has needles in place  Ab: soft, nt, ostomy in place        Recent Labs  Lab 01/12/18  0537 01/11/18  2042 01/11/18  0120 01/10/18  0058   Sodium 132*  --  135 137   Potassium 5.4* 5.1 6.4* 6.4*   Chloride 89*  --  92* 92*   CO2 30*  --  29* 33*   UN 25*  --  21* 29*   Creatinine 7.58*  --  6.66* 8.70*   Glucose 92  --  112* 80   Calcium 9.7  --  9.5 9.3   Albumin  --   --  4.1 3.7   Phosphorus 7.3*  --  6.5* 7.4*        Recent Labs  Lab 01/12/18  0537 01/11/18  0120 01/10/18  0058   WBC 8.4 7.9 7.3   Hemoglobin 9.9* 9.9* 9.2*   Hematocrit 30* 30* 27*   Platelets 134* 140* 136*          No results found.         Assessment/Plan  Victor Flowers is a 72 y.o. Male  ESRD on HD admitted for hyperkalemia induced weakness.  Etiology of hyperkalemia may be related to dietary indiscretion.  Bleeding is also a concern, but given hgb stability seems less likely.    Plan:  1. Hemodialysis Note:   Presently dialyzing:     F180 Na 138 K 2 Ca 2.5 HCO3 35 Qb: 400 ml/min Qd: 1.5X Qb UF KWE today      EDW = TBD.   Low potassium, low sodium diet please   Dose all medications for creatinine clearance < 10 ml/min    Limit beverage intake to 1.5 L per day   Give Nephrovite or similar renal vitamin daily    2.  Anemia:   hgb acceptable.  Hold on futher     3.  Hyperparathyroidism:    Low phos diet.  Continue Renvela.    4.  Hypertension:   Blood pressure is controlled.  Continue current medication regimen.     5.  Access: AVG  appears to be working adequately.    6.  Hyperkalemia:   Will check LDH.     Low K diet.   Would have dietitian see patient.   Plan to check URR next treatment if patient remains hospitalized to ensure clearance is acceptable.    Author: Fernande Bras, MD  as of: 01/12/2018  at: 10:10 AM

## 2018-01-12 NOTE — Progress Notes (Signed)
[place] Right upper extremity arteriovenous graft with a severe stenosis 10cm beyond the anastomosis, near the origin of the stent grafts.   To have fistulogram tomorrow  Cornelia Copa, NP   2:82 PM  01/12/18

## 2018-01-12 NOTE — Progress Notes (Signed)
Writer attempted to meet with patient to complete psychosocial assessment however patient was sleeping soundly.  Writer will attempt to meet with patient at a later time.    Darlyn Chamber, [place]

## 2018-01-12 NOTE — Progress Notes (Signed)
Received PT order. Patient is currently off the unit for HD. Will try back later as able vs. tomorrow If PT services needed urgently, please page PT.    Natalina Wieting A. Ronney Lion, PT  Pager [phone]

## 2018-01-12 NOTE — Provider Consult (Addendum)
Colorectal Surgery Consult Note    Patient: Victor Flowers  LOS: 3 days  Attending: Dr. Tinnie Gens        CC: pain with [place] pouch intubation     HPI: Victor Flowers is a 72 y.o. 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 He was seen by [facility] and complained of pain with stoma intubation.     Patient reports abdominal cramping near the stoma. It is intermittent and relieved somewhat with passing flatus. He reports he uses a 33F catheter to intubate the stoma. He reports he sometimes meets resistance as the stoma as it has prolapsed a bit with time. He reports it is easiest to intubate while he is standing. He sometimes has pain while he is intubating the stoma and reports it is sometime difficult to find the correct tract. He is independent with the care. He reports that the stool has become a bit looser over time but it has been productive of air and stool. He reports that he does not pass anything from below.     Past Medical History:   Past Medical History:   Diagnosis Date    Barrett's esophagus     BPH (benign prostatic hyperplasia)     ESRD needing dialysis     FAP (familial adenomatous polyposis)     HLD (hyperlipidemia)     HTN (hypertension)        Past Surgical History:   Past Surgical History:   Procedure Laterality Date    PR TEMPORAL ARTERY LIGATN OR BX N/A 03/28/2017    Procedure: TEMPORAL ARTERY BIOPSY;  Surgeon: Anselmo Rod, MD;  Location: [facility] MAIN OR;  Service: Ophthalmology       Medications:  Current Facility-Administered Medications   Medication    [START ON 01/13/2018] insulin glargine (LANTUS) injection 7 Units    acetaminophen (TYLENOL) tablet 650 mg    dextrose (GLUTOSE) 40 % oral gel 15 g    And    dextrose 50% (0.5 g/mL) injection 25 g    And    glucagon (GLUCAGEN) injection 1 mg    dorzolamide (TRUSOPT) 2 % ophthalmic solution 1 drop    timolol (TIMOPTIC) 0.25 % ophthalmic solution 1 drop    latanoprost  (XALATAN) 0.005 % ophthalmic solution 1 drop    sevelamer (RENVELA) packet 1.6 g    b complex-vitamin c-folic acid (NEPHRO-VITE) tablet 1 tablet    pantoprazole (PROTONIX) EC tablet 40 mg    insulin lispro (HumaLOG,ADMELOG) injection 0-30 Units       Allergies:   Allergies   Allergen Reactions    Metformin Diarrhea    Bactrim [Sulfamethoxazole-Trimethoprim] Other (See Comments)     weakness    Heparin Other (See Comments)     Thrombocytopenia. Unclear severity. Per chart review, had a positive SRA    Lisinopril Other (See Comments)     Weakness, renal function changes       Family History:   family history is not on file.    Social History:   Social History   Substance Use Topics    Smoking status: Former Smoker    Smokeless tobacco: Never Used    Alcohol use No        Review of Systems: As above, otherwise 12 point review of systems negative    Physical Examination:  Vitals:  Temp:  [35.7 C (96.3 F)-37 C (98.6 F)] 36.3 C (  97.3 F)  Heart Rate:  [58-84] 58  Resp:  [11-21] 16  BP: (79-118)/(36-62) 114/56  Height: 182.9 cm (6') Weight: 70.1 kg (154 lb 8.7 oz)    General appearance: No apparent distress. Resting comfortably.   Neuro: Alert, oriented, no focal deficits  Head: Face symmetric. atraumatic   Heart: Regular rate and rhythm. Normal S1 and S2.   Lungs: Nonlabored respirations, on room air   Abdomen: Soft, nondistended, non tender, stoma pink and viable, productive of air and soft light brown stool, able to digitally intubate the stoma, no resistance at fascial edge though some tenderness with palpation of the fascia   Extremities: Atraumatic, wwp, no cyanosis, erythema, edema.   Vascular: Peripheral pulses present and equal bilaterally    Lab Results:    Recent Labs  Lab 01/12/18  0537 01/11/18  2042 01/11/18  0120 01/10/18  0058   Sodium 132*  --  135 137   Potassium 5.4* 5.1 6.4* 6.4*   Chloride 89*  --  92* 92*   CO2 30*  --  29* 33*   UN 25*  --  21* 29*   Creatinine 7.58*  --  6.66* 8.70*    Glucose 92  --  112* 80   Calcium 9.7  --  9.5 9.3   Magnesium 2.1  --  2.0 1.8       No components found with this basename: PHOS, PREALBUMIN   Recent Labs  Lab 01/12/18  0537   INR 1.0   Protime 11.2       No components found with this basename: APTT     Recent Labs  Lab 01/11/18  0120 01/10/18  0058   Albumin 4.1 3.7      Recent Labs  Lab 01/12/18  0537 01/11/18  0120 01/10/18  0058   WBC 8.4 7.9 7.3   Hemoglobin 9.9* 9.9* 9.2*   Hematocrit 30* 30* 27*   Platelets 134* 140* 136*        Imaging:   No results found.         ASSESSMENT  Victor Flowers is a 72 y.o. 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 Colorectal surgery consulted for pain with Kock pouch intubation.     PLAN     Continue routine stoma care and intubations   Will discuss with patient conversion to end ileostomy as this is the only definitive long term solution for kock pouch issues    Discussed with Dr. Tinnie Gens, attending on call     Please page the colorectal surgery resident on call with any questions.     Author: Sheryn Bison, MD as of: 01/12/2018  at: 4:33 PM   I saw and evaluated the patient. I agree with the resident's/fellow's findings and plan of care as documented above. Details of my evaluation are as follows: patient has had a Koch pouch for over 30 years. Unfortunately at this point it is not functioning very well. From the surgical perspective  The only option is to convert the pouch to  A permanent ileostomy. I  will be happy to meet with the patient and discuss it further in the office after he has been discharged as this will be completely elective.   Marland Kitchen     Norris Cross, MD

## 2018-01-12 NOTE — Plan of Care (Signed)
Cognitive function     Cognitive function will be maintained or return to baseline Maintaining        Nutrition     Patient's nutritional status is maintained or improved Maintaining        Pain/Comfort     Patient's pain or discomfort is manageable Maintaining        Safety     Patient will remain free of falls Maintaining     Prevent any intentional injury Maintaining          Mobility     Patient's functional status is maintained or improved Progressing towards goal        Psychosocial     Demonstrates ability to cope with illness Progressing towards goal           Writer assumed care of patient at 1900. Patient was alert and oriented x3.     The patient was independent with his ostomy changing.    Patient denies complaints overnight, is currently resting comfortably.     VSS. Will continue to monitor.     Theodoro Doing, RN

## 2018-01-12 NOTE — Consults (Signed)
Medical Nutrition Therapy - Initial Assessment    Admit Date: 01/09/2018    Reason for consult: Triggered by nursing risk screen assessment (MST=2) for unsure weight loss and diet teaching.    Patient Summary: 72 y/o male with PMH ESRD on HD, CAD, HTN, T2DM, subtotal colectomy (currently with colostomy bag). Presenting with severe hyperkalemia in setting of missed dialysis. Patient originally from [place] and missed HD while visiting sister in [place]. Sister at bedside reports that memory and organizational skills have declined and that patient is nonadherent to diet restrictions.     Past Medical History:   Diagnosis Date    Barrett's esophagus     BPH (benign prostatic hyperplasia)     ESRD needing dialysis     FAP (familial adenomatous polyposis)     HLD (hyperlipidemia)     HTN (hypertension)      Past Surgical History:   Procedure Laterality Date    PR TEMPORAL ARTERY LIGATN OR BX N/A 03/28/2017    Procedure: TEMPORAL ARTERY BIOPSY;  Surgeon: Anselmo Rod, MD;  Location: [facility] MAIN OR;  Service: Ophthalmology        Pertinent Social Hx: Lives alone in [place], but was staying with sister in [place] for a prolonged visit.     Pertinent Meds: reviewed on 6/3: Nephrovite, protonix, insulin s/s  Pertinent Labs: reviewed on 6/3: sodium 132 (L), potassium 5.4 (H), UN 25 (H), creatinine 7.58 (H), phos 7.3 (H)     Reviewed I/O's: on 6/2: 1000 mL UF reported output, 1x stool    Enteral or parenteral access: PO    Nutrition Hx: Met with patient this PM. Patient states that he has a good appetite and is eating 3 meals a day currently. He says he feels very hungry and wants more food than he is being given at the hospital. At home, he eats 3 meals a day. A typical day's meals are: breakfast of cereal and almond milk, snacks, lunch of sandwich with deli meat and some fruit, and dinner of pork chops and no vegetables unless cooked soft. He only eats softer foods due to his ileostomy. Patient says he avoids  potatoes, tomatoes, milk, and bananas due to the potassium and phosphorus. Patient denies nausea and vomiting, but states he has diarrhea. He also states his UBW of 160 - 170 lbs and that he has lost significant weight in the past month.     Food allergies: NKFA    Current diet: NPO  Supplements: None    Nutrition Focused Physical Exam:  Edema: None noted (per NP note 6/2)  Abdomen: +BS, +flatus (per RN shift assessment)  Skin: Bruising (per RN shift assessment)  Body fat stores: Mild orbital wasting.   Lean body mass stores: Mild temporalis and pectoralis wasting.     Anthropometrics:  Height: 182.9 cm (6')    Current Weight: 64.5 kg (142 lb 1.6 oz); 77% IBW; 91% UBW  UBW: 70.5 kg  Ideal Body Weight: 83.6 kg + 10% (based on ideal BMI of 25 for adults > 65 yrs)  BMI: Body mass index is 19.27 kg/m. Underweight. (based on a desirable BMI of 23-27 for persons >65 years of age)  Weight Hx: Patient weight fluctuating, may be due to fluid status, as noted by 8% weight loss from 6/1 to 6/3. Otherwise, patient weight appears stable from 03/28/17 to 6/1/1 at approx. 70 kg.  WEIGHTS Weight (metric) Weight (standard) Weight Method   01/12/2018 64.456 kg 142 lbs 2 oz Actual  01/12/2018 63.957 kg 141 lbs Estimated   01/11/2018 (No Data)  (No Data)  --   01/11/2018 64.4 kg  142 lbs  --   01/11/2018 64.365 kg 141 lbs 14 oz Actual   01/10/2018 68.5 kg 151 lbs --   01/10/2018 70.1 kg 154 lbs 9 oz --   01/10/2018 70.1 kg 154 lbs 9 oz --   03/28/2017 69.6 kg 153 lbs 7 oz Actual     Estimated Nutrient Needs: (Based on 64.5 kg CBW)    1950 - 2300 kcal/day (30-35 kcal/kg)   80 - 100 g protein/day (1.2-1.5 g/kg)    1950 - 2300 mL fluid/day (1 mL/kcal) or per team    Nutrition Assessment and Diagnosis:   Altered nutrition related labs related to ESRD and non-adherence with dietary restrictions and as evidenced by sodium 132 (L), potassium 5.4 (H), UN 25 (H), creatinine 7.58 (H), phos 7.3 (H)    Chart, meds, labs reviewed. Patient weight fluctuating, may  be due to fluid status, as noted by 8% weight loss from 6/1 to 6/3. Otherwise, patient weight appears stable from 03/28/17 to 01/10/18 at approx. 70 kg. Met with patient this PM for assessment and diet education. Patient reports having a good appetite and feeling hungry. At home, he has been eating 3 meals a day of food, such as cereal, almond milk, deli meat sandwiches, and soft fruits and vegetables. Per flowsheets, at the hospital, patient is consuming 100% of meals. Patient reported wanting more food than he is being served (he is currently NPO). He expressed wanting to try nutrition supplement drinks, like Nepro.     Also met with patient for diet education on renal diet (low sodium, potassium, and phosphorus diet). Provided patient with the following handouts: Low Sodium, Potassium, Phosphorus Diet, Sodium Content of Foods List, Potassium Content of Foods List, and Phosphorus Content of Foods List. Patient expressed that he does not eat certain foods, like potatoes, tomatoes, bananas, and dairy due to these restrictions, but writer went over other food sources of electrolytes that patient may not be aware of. Patient expressed understanding this.    Malnutrition Status: Pt does not meet criteria for malnutrition at this time.      Nutrition Intervention:   1. Continue on low potassium, low phosphorus and diabetic diet once no longer NPO.   2. Consider Nepro (nutrition supplement drink) daily.  3. Continue Nephrovite.  4. Please monitor:    I/Os   % meal intake - RN assistance appreciated   Weights at least weekly    Nutrition Monitoring/Evaluation:   1. Will monitor diet tolerance and intake, nutrition-related labs, weight trend, BM pattern, supplement acceptance.   2. Nutrition to follow up per high nutrition risk protocol.    Huel Coventry, Dietetic Intern

## 2018-01-12 NOTE — Progress Notes (Signed)
Report Given To  01/12/18  Whitney RN  Patient tolerated 3 hours 45 minutes of HD treatment, ended tx 15 minutes early d/t low  B/P Gross, NP aware. No fluid removal per Dr. Williams Che request.      Descriptive Sentence / Reason for Admission   Duration of Treatment (minutes): 921 minutes  Complications: Low B/P        Active Issues / Relevant Events   Dialysis weight change (kg): 0.1 kg  Review of medications administered  such as routine meds, antibiotics, and heparin: none  Vital Signs: see flow sheet        To Do List  Medications still needing administration: none dialysis  Dressing change due : Please remove dressing from the Right AVG approximately 6 hrs post HD treatment.      Anticipatory Guidance / Discharge Planning  Date/Time of Next Treatment: 01/14/18  Weigh patient daily in order to monitor fluid balance.
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
## 2018-01-13 ENCOUNTER — Inpatient Hospital Stay: Payer: Medicare (Managed Care)

## 2018-01-13 ENCOUNTER — Encounter: Payer: Self-pay | Admitting: Radiology

## 2018-01-13 DIAGNOSIS — E1122 Type 2 diabetes mellitus with diabetic chronic kidney disease: Secondary | ICD-10-CM

## 2018-01-13 DIAGNOSIS — Z992 Dependence on renal dialysis: Secondary | ICD-10-CM

## 2018-01-13 DIAGNOSIS — Z794 Long term (current) use of insulin: Secondary | ICD-10-CM

## 2018-01-13 DIAGNOSIS — E875 Hyperkalemia: Principal | ICD-10-CM

## 2018-01-13 DIAGNOSIS — D126 Benign neoplasm of colon, unspecified: Secondary | ICD-10-CM

## 2018-01-13 DIAGNOSIS — T82858A Stenosis of vascular prosthetic devices, implants and grafts, initial encounter: Secondary | ICD-10-CM | POA: Diagnosis present

## 2018-01-13 DIAGNOSIS — K22719 Barrett's esophagus with dysplasia, unspecified: Secondary | ICD-10-CM

## 2018-01-13 DIAGNOSIS — N186 End stage renal disease: Secondary | ICD-10-CM

## 2018-01-13 HISTORY — PX: IR ANGIOGRAPHY AV DIALYSIS SHUNT: IMG18000

## 2018-01-13 LAB — BASIC METABOLIC PANEL
Anion Gap: 14 (ref 7–16)
CO2: 30 mmol/L — ABNORMAL HIGH (ref 20–28)
Calcium: 9.3 mg/dL (ref 8.6–10.2)
Chloride: 89 mmol/L — ABNORMAL LOW (ref 96–108)
Creatinine: 5.84 mg/dL — ABNORMAL HIGH (ref 0.67–1.17)
GFR,Black: 10 * — AB
GFR,Caucasian: 9 * — AB
Glucose: 201 mg/dL — ABNORMAL HIGH (ref 60–99)
Lab: 19 mg/dL (ref 6–20)
Potassium: 5.1 mmol/L (ref 3.3–5.1)
Sodium: 133 mmol/L (ref 133–145)

## 2018-01-13 LAB — POCT GLUCOSE
Glucose POCT: 121 mg/dL — ABNORMAL HIGH (ref 60–99)
Glucose POCT: 134 mg/dL — ABNORMAL HIGH (ref 60–99)
Glucose POCT: 142 mg/dL — ABNORMAL HIGH (ref 60–99)
Glucose POCT: 151 mg/dL — ABNORMAL HIGH (ref 60–99)
Glucose POCT: 300 mg/dL — ABNORMAL HIGH (ref 60–99)

## 2018-01-13 MED ORDER — INSULIN GLARGINE 100 UNIT/ML SC SOLN *WRAPPED*
4.0000 [IU] | Freq: Every evening | SUBCUTANEOUS | Status: DC
Start: 2018-01-13 — End: 2018-01-27
  Administered 2018-01-13 – 2018-01-26 (×13): 4 [IU] via SUBCUTANEOUS

## 2018-01-13 MED ORDER — MELATONIN 3 MG PO TABS *I*
3.0000 mg | ORAL_TABLET | Freq: Every evening | ORAL | Status: DC | PRN
Start: 2018-01-13 — End: 2018-01-31
  Administered 2018-01-14 – 2018-01-27 (×8): 3 mg via ORAL
  Filled 2018-01-13 (×9): qty 1

## 2018-01-13 MED ORDER — MIDAZOLAM HCL 1 MG/ML IJ SOLN *I* WRAPPED
INTRAMUSCULAR | Status: AC | PRN
Start: 2018-01-13 — End: 2018-01-13
  Administered 2018-01-13: 1 mg via INTRAVENOUS

## 2018-01-13 MED ORDER — FENTANYL CITRATE 50 MCG/ML IJ SOLN *WRAPPED*
INTRAMUSCULAR | Status: AC | PRN
Start: 2018-01-13 — End: 2018-01-13
  Administered 2018-01-13 (×2): 50 ug via INTRAVENOUS

## 2018-01-13 MED ORDER — MIDAZOLAM HCL 1 MG/ML IJ SOLN *I* WRAPPED
INTRAMUSCULAR | Status: AC
Start: 2018-01-13 — End: 2018-01-13
  Filled 2018-01-13: qty 2

## 2018-01-13 MED ORDER — FENTANYL CITRATE 50 MCG/ML IJ SOLN *WRAPPED*
INTRAMUSCULAR | Status: AC
Start: 2018-01-13 — End: 2018-01-13
  Filled 2018-01-13: qty 2

## 2018-01-13 NOTE — Progress Notes (Signed)
[employer] Nursing Procedure Note    DANNON NGUYENTHI  [id number]    Procedure: Fistulagram        Status: Completed    Patient tolerated procedure well     Specimen Collection: no    Sponge count: N/A    Fluid Removed:No  Procedure Dressing Site located:Right Arm  Dressing Type:sterile gauze/tegaderm  Biopatch:no  Dressing status:Clean, dry and intact   Hematoma:Not evident  Medication received:Versed 1mg  and Fentanyl 133mcg  Cardiovascular:   Peripheral Pulses: N/A      Fistula: Thrill and bruit present    Neuro Assessment:N/A    Implant patient information given to patient or parent/guardian:N/A    Report given to:Unit Nurse. Unit 612 Name Whitney RN      Last Filed Vitals    01/13/18 1308   BP: 131/51   Pulse: 68   Resp: 17   Temp:    SpO2: 100%

## 2018-01-13 NOTE — Progress Notes (Signed)
Hospital Medicine Service Attending Progress Note    Significant Events/Subjective:   Patient reports that he feels well today.  He reports fistulagram went swimmingly.  He denies dyspnea.    Objective:     Physical Exam  Temp:  [35.7 C (96.3 F)-36.4 C (97.5 F)] 36.4 C (97.5 F)  Heart Rate:  [54-83] 54  Resp:  [11-21] 18  BP: (79-126)/(36-67) 126/64  63.5 kg (140 lb 1.6 oz)    Constitutional: sitting up in bed, in no acute distress   HENT: anicteric, severe clouding of right eye  CV: RRR  Resp: normal work of breathing, on room air, lungs CTAB  GI: soft, non-tender  Skin/Lines: warm, dry, RUE AVF  Neuro: awake, alert    Recent Lab, Micro, and Imaging Studies   Personally reviewed and notable for:    K 5.1  CO2 30    RUE AV fistula ultrasound: Right upper extremity arteriovenous graft with a severe stenosis 10cm beyond the anastomosis, near the origin of the stent grafts.     Current Inpatient Medications:   insulin glargine  7 Units Subcutaneous Nightly    dorzolamide  1 drop Left Eye 2 times per day    timolol  1 drop Left Eye 2 times per day    latanoprost  1 drop Left Eye Nightly    sevelamer  1.6 g Oral TID WC    b complex-vitamin c-folic acid  1 tablet Oral Daily    pantoprazole  40 mg Oral Daily    insulin lispro  0-30 Units Subcutaneous TID WC     acetaminophen, Nursing communication- Give 4 OZ of fruit juice for BG < 70 mg/dl **AND** dextrose **AND** dextrose **AND** glucagon **AND** POCT glucose    Assessment:     72 y.o. male with a history of ESRD on HD, HTN, DM2, prostate ca s/p TURP, FAP s/p subtotal colectomy, GERD c/b Barrett esophagus who presented with severe hyperkalemia in the setting of missed hemodialysis.    Plans:     ESRD/hyperkalemia/hyperphosphatemia:  - Etiology of hyperkalemia appears missed HD and poor dietary adherence.  - K trending down with dietary restriction and multiple HD sessions.  - AV fistula ultrasound demonstrated area of severe stenosis.  Fistulagram today  with angioplasty.  - Appreciate nephrology assistance.    HTN:  - By history.  He does not appear to be on antihypertensive meds.  Patient with intermittent hypotension with HD.  Would not plan to start BP meds.    DM2:  - Having relatively low BGs despite significantly decreased insulin doses compared to home regimen.  - Continue Lantus 7u nightly.  On 35u Levemir at home.  - Continue Humalog TID AC with 0u baseline.  On 12u baseline at home.  - Concern for excessively strict glycemic control at home likely causing hypoglycemia.  A1c in March (via Care Everywhere) of 5.6.    Possible cognitive decline:  - B12 and TSH wnl.  - Holding gabapentin.  - PT/OT.    Glaucoma:  - Continue gtts.    GERD/Barrett esophagus:  - Continue PPI.    FAP s/p colectomy:  - Patient having pain with catheterization of Kock pouch.  CRS advising possible conversion to end ileostomy which patient is considering.  - Appreciate CRS, WOCN assistance.    Left shoulder pain:  - Negative x-rays.    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 Discharge  Date (EDD): 6/6  Discharge Criteria/Barriers to Discharge: tolerating HD, possible placement  PT and/or OT Recommendations/Discharge to: possibly SNF, PT eval pending  Appointments Needed with: TBD     Case discussed with APP, RN, SW.    Wynona Meals, MD on 01/13/2018 at 7:53 AM

## 2018-01-13 NOTE — Progress Notes (Signed)
Interventional Radiology Pre-Procedure Handoff/Checklist    NPO: [x] YES [] NO [] N/A     Tube feed Stopped: [] YES [] NO [x] N/A     Anticoagulants:none    Telemetry: [] YES [x] NO [] N/A     Transport mode: Biomedical scientist  Number of Transporters needed: 1    Gown: yes      IV access:   Peripheral IV 01/11/18 0050 Left;Medial Wrist (Active)   Phlebitis Scale Grade 0 01/12/2018  7:00 PM   Infiltration Scale Grade 0 01/12/2018  7:00 PM   Line Status Flushed;Saline locked 01/12/2018  7:00 PM   Dressing Type Transparent 01/12/2018  7:00 PM   Dressing Status Clean, dry and intact 01/12/2018  7:00 PM       Respiratory: Room Air    Consentable:yes    Alert and Oriented to person, place and time?: yes    Code Status:Full     Does patient wear an insulin pump? [] YES [x] NO [] N/A     Precautions:none    Allergies:   Allergies   Allergen Reactions    Metformin Diarrhea    Bactrim [Sulfamethoxazole-Trimethoprim] Other (See Comments)     weakness    Heparin Other (See Comments)     Thrombocytopenia. Unclear severity. Per chart review, had a positive SRA    Lisinopril Other (See Comments)     Weakness, renal function changes       Eliane Decree, RN Received Handoff Report from [facility] for IR procedure 7:58 AM

## 2018-01-13 NOTE — Interval H&P Note (Signed)
UPDATES TO PATIENT'S CONDITION on the DAY OF SURGERY/PROCEDURE    I. Updates to Patient's Condition (to be completed by a provider privileged to complete a H&P, following reassessment of the patient by the provider):    Day of Surgery/Procedure Update:  History  History reviewed and no change    Physical  Physical exam updated and no change    Pre-Procedure Assessment  Airway Visibility: Posterior pharynx  Lungs: Clear  Heart sounds: Rate: 80 bpm  Heart sounds rhythm: Regular Rate Rhythm  ASA Physical status rating: Class III: Severe systemic disease       II. Procedure Readiness   I have reviewed the patient's H&P and updated condition. By completing and signing this form, I attest that this patient is ready for surgery/procedure.    III. Attestation   I have reviewed the updated information regarding the patient's condition and it is appropriate to proceed with the planned surgery/procedure.    Alcide Clever, MD as of 12:52 PM 01/13/2018

## 2018-01-13 NOTE — H&P (View-Only) (Signed)
Colorectal Surgery Consult Note    Patient: WINTER TREFZ  LOS: 3 days  Attending: Dr. Tinnie Gens        CC: pain with [place] pouch intubation     HPI: KAMIN NIBLACK is a 72 y.o. 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 He was seen by [facility] and complained of pain with stoma intubation.     Patient reports abdominal cramping near the stoma. It is intermittent and relieved somewhat with passing flatus. He reports he uses a 59F catheter to intubate the stoma. He reports he sometimes meets resistance as the stoma as it has prolapsed a bit with time. He reports it is easiest to intubate while he is standing. He sometimes has pain while he is intubating the stoma and reports it is sometime difficult to find the correct tract. He is independent with the care. He reports that the stool has become a bit looser over time but it has been productive of air and stool. He reports that he does not pass anything from below.     Past Medical History:   Past Medical History:   Diagnosis Date    Barrett's esophagus     BPH (benign prostatic hyperplasia)     ESRD needing dialysis     FAP (familial adenomatous polyposis)     HLD (hyperlipidemia)     HTN (hypertension)        Past Surgical History:   Past Surgical History:   Procedure Laterality Date    PR TEMPORAL ARTERY LIGATN OR BX N/A 03/28/2017    Procedure: TEMPORAL ARTERY BIOPSY;  Surgeon: Anselmo Rod, MD;  Location: [facility] MAIN OR;  Service: Ophthalmology       Medications:  Current Facility-Administered Medications   Medication    [START ON 01/13/2018] insulin glargine (LANTUS) injection 7 Units    acetaminophen (TYLENOL) tablet 650 mg    dextrose (GLUTOSE) 40 % oral gel 15 g    And    dextrose 50% (0.5 g/mL) injection 25 g    And    glucagon (GLUCAGEN) injection 1 mg    dorzolamide (TRUSOPT) 2 % ophthalmic solution 1 drop    timolol (TIMOPTIC) 0.25 % ophthalmic solution 1 drop    latanoprost  (XALATAN) 0.005 % ophthalmic solution 1 drop    sevelamer (RENVELA) packet 1.6 g    b complex-vitamin c-folic acid (NEPHRO-VITE) tablet 1 tablet    pantoprazole (PROTONIX) EC tablet 40 mg    insulin lispro (HumaLOG,ADMELOG) injection 0-30 Units       Allergies:   Allergies   Allergen Reactions    Metformin Diarrhea    Bactrim [Sulfamethoxazole-Trimethoprim] Other (See Comments)     weakness    Heparin Other (See Comments)     Thrombocytopenia. Unclear severity. Per chart review, had a positive SRA    Lisinopril Other (See Comments)     Weakness, renal function changes       Family History:   family history is not on file.    Social History:   Social History   Substance Use Topics    Smoking status: Former Smoker    Smokeless tobacco: Never Used    Alcohol use No        Review of Systems: As above, otherwise 12 point review of systems negative    Physical Examination:  Vitals:  Temp:  [35.7 C (96.3 F)-37 C (98.6 F)] 36.3 C (  97.3 F)  Heart Rate:  [58-84] 58  Resp:  [11-21] 16  BP: (79-118)/(36-62) 114/56  Height: 182.9 cm (6') Weight: 70.1 kg (154 lb 8.7 oz)    General appearance: No apparent distress. Resting comfortably.   Neuro: Alert, oriented, no focal deficits  Head: Face symmetric. atraumatic   Heart: Regular rate and rhythm. Normal S1 and S2.   Lungs: Nonlabored respirations, on room air   Abdomen: Soft, nondistended, non tender, stoma pink and viable, productive of air and soft light brown stool, able to digitally intubate the stoma, no resistance at fascial edge though some tenderness with palpation of the fascia   Extremities: Atraumatic, wwp, no cyanosis, erythema, edema.   Vascular: Peripheral pulses present and equal bilaterally    Lab Results:    Recent Labs  Lab 01/12/18  0537 01/11/18  2042 01/11/18  0120 01/10/18  0058   Sodium 132*  --  135 137   Potassium 5.4* 5.1 6.4* 6.4*   Chloride 89*  --  92* 92*   CO2 30*  --  29* 33*   UN 25*  --  21* 29*   Creatinine 7.58*  --  6.66* 8.70*    Glucose 92  --  112* 80   Calcium 9.7  --  9.5 9.3   Magnesium 2.1  --  2.0 1.8       No components found with this basename: PHOS, PREALBUMIN   Recent Labs  Lab 01/12/18  0537   INR 1.0   Protime 11.2       No components found with this basename: APTT     Recent Labs  Lab 01/11/18  0120 01/10/18  0058   Albumin 4.1 3.7      Recent Labs  Lab 01/12/18  0537 01/11/18  0120 01/10/18  0058   WBC 8.4 7.9 7.3   Hemoglobin 9.9* 9.9* 9.2*   Hematocrit 30* 30* 27*   Platelets 134* 140* 136*        Imaging:   No results found.         ASSESSMENT  DOMNICK CHERVENAK is a 72 y.o. 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 Colorectal surgery consulted for pain with Kock pouch intubation.     PLAN     Continue routine stoma care and intubations   Will discuss with patient conversion to end ileostomy as this is the only definitive long term solution for kock pouch issues    Discussed with Dr. Tinnie Gens, attending on call     Please page the colorectal surgery resident on call with any questions.     Author: Sheryn Bison, MD as of: 01/12/2018  at: 4:33 PM

## 2018-01-13 NOTE — Progress Notes (Signed)
Patient currently off the unit for a fistulogram with conscious sedation in IR. Unable to complete PT eval. Will try again tomorrow.     Haylo Fake A. Ronney Lion, PT  Pager [phone]

## 2018-01-13 NOTE — Continuity of Care (Signed)
PATIENT MEDICATION HISTORY      A Pharmacy Technician [facility] [phone]) reviewed patient's prior to admission (PTA) medications.      Their eRec PTA med list has been updated and is listed below       The following changes may affect their current inpatient stay:      Discrepancies Inpatient Regimen: not ordered inpatient    Home Regimen: Atorvastatin 10mg  po daily - added to home med list       Inpatient Regimen: not ordered inpatient    Home Regimen:     Sodium Bicarbonate 1300mg  po TID  Magnesium Oxide 800mg  po daily    - both added to home med list       Other Discrepancies:  - Levemir is 30 units sq daily (not 35 units as originally listed)  - Humalog is 4-6 units with meals (not 12 units as originally listed)  - Removed Hydroxyzine 10mg  po nightly prn anxiety, per pt never used this at home, no fill history noted     Sources of information when verifying home medications   Patient interview, [facility], pharmacies   What pharmacies does the patient use?    Which one is their primary pharmacy?   [place]  [facility]    [facility] while he's here   Does the patient have medication insurance?   Yes   Who manages the patient's meds?  (Patient, family, friends, caretaker, etc)    If not the patient then who (name and contact #)? Patient         Prior to Admission Medications     Prescriptions Last Dose Informant Patient Reported? Taking?    sodium bicarbonate 650 MG tablet   Yes Yes    Take 1,300 mg by mouth 3 times daily    atorvastatin (LIPITOR) 10 MG tablet   Yes Yes    Take 10 mg by mouth daily    Note (01/13/2018): [facility]): Added     magnesium oxide (MAG-OX) 400 (241.3 MG) MG tablet   Yes Yes    Take 800 mg by mouth daily    sodium polystyrene (KAYEXALATE) 15 GM/60ML suspension Past Week  Yes Yes    Take 30 g by mouth three times a week   Take if directed by MD for elevated potassium.    gabapentin (NEURONTIN) 300 MG  capsule 01/09/2018  Yes Yes    Take 300 mg by mouth daily    pantoprazole (PROTONIX) 40 MG EC tablet 01/09/2018  Yes Yes    Take 40 mg by mouth daily   Swallow whole. Do not crush, break, or chew.    sevelamer (RENVELA) 800 MG tablet 01/09/2018  Yes Yes    Take 1,600 mg by mouth 3 times daily (with meals)   Swallow whole. Do not crush, break, or chew.    Note (01/13/2018): [facility]): Patient states still taking, pharmacy last filled on 08/16/17 for a 30-day supply     B Complex-C-Folic Acid (RENAL-VITE) 0.8 MG TABS 01/09/2018  Yes Yes    Take 0.8 mg by mouth at bedtime    dorzolamide-timolol (COSOPT) 22.3-6.8 MG/ML ophthalmic solution   Yes Yes    Place 1 drop into the left eye 2 times daily    HYDROcodone-acetaminophen (NORCO) 5-325 MG per tablet 01/09/2018  Yes Yes    Take 1 tablet by mouth every 8 hours as needed for Pain  insulin detemir (LEVEMIR) 100 UNIT/ML injection vial 01/08/2018  Yes Yes    Inject 30 Units into the skin daily   Maximum  46  Units/day     insulin lispro 100 UNIT/ML injection vial   Yes Yes    Inject 4-6 Units into the skin 3 times daily (before meals)   Maximum 18  Units/day    latanoprost (XALATAN) 0.005 % ophthalmic solution   Yes Yes    Place 1 drop into the left eye nightly    multi-vitamin (MULTIVITAMIN) per tablet   Yes Yes    Take 1 tablet by mouth daily          Haskell Flirt PharmD, [place] Pharmacist  [phone]  [place]

## 2018-01-13 NOTE — Comprehensive Assessment (Signed)
Adult Social Work Initial Assessment    Patient is a 72 year old male presenting with dyspnea with a history of ESRD.  SW met with patient and spoke with patient's sister Vaughan Basta with patient's consent  to complete the psychosocial assessment.  Patient had undergone a procedure earlier in the day and was experiencing difficulty answering questions during the evaluation.  Patient currently resides alone in [place], he was in [place] ([address], [place]) visiting his sister since the end of April and missed some dialysis sessions. Per patient in [place] he is independent at baseline and does not utilize any equipment and continues to drive.  Patient drove from [place] to [place] and his intention is to return home in [place] once medically ready to be discharged from the hospital.    While in [place] he has been receiving dialysis at the [place] location.  Writer contacted [place] at the clinic [phone]) and she advised that the patient no longer has an available chair at their location.  Patient will need a new referral.    However, patient's sister stated that patient is unable to complete ADLs independently including bathing, meal preparation and also is unable to manage medications or maintain compliance with diabetic diet.   Patient's sister advised that patient is not able to return to her in [place] as she has other family coming for the summer and does not have the space for the patient.      SW will follow until time of discharge to assist as needed.     Demographics:  Religious Beliefs: [employer] of Residence: Other (Comment)  Marital status: Divorced  Ethnicity/Race: Caucasian  Primary Language: English  Primary Care Taker of?: No one    Risk Factors:  Risk Factors: Adjustment to Dx/Injury/Illness, Questionable ability for self care or safety    Advance Directive:  *Has patient (or family) completed any of the following? (select all that apply): None  completed  *Would they like to discuss any issues related to MOLST, DNR Order, HCP, Living Will, or POA?: Yes  Consult with medical provider arranged: No (provider is the one completing the section)  *Health Care Directive teaching done: Yes    Personal Contacts/Support System:  PIN: [place]  Spokesperson Name: Donalynn Furlong  Relationship to Patient : Sister  Phone Number(s): [phone]  Has Spokesperson been notified of admission?: Yes  Method of Notification: In Person  Alternate Spokesperson?: Yes  Spokesperson Alternate: [name]  Relationship to Patient : Sister  Phone Number(s): [phone]  Emergency Contact other than spokesperson?: No  Is spokesperson the patient's caregiver after discharge?: No  Do you have a caregiver after discharge?: No (Patient does not wish to designate a caregiver at this time.)  Additional Contacts/Supports: Yes              Family Member Name: Lanetta Inch (Daughter)  Family Member Number: [phone]       Living Situation:  Lives With: Alone  Primary Care Taker of?: No one    Psychosocial:  Person assessed: Patient, Family  Coping Status: With some difficulty  Current Goal of Care:  (dual plan)    Baseline ADL functioning:  Transfers: Independent  Ambulation: Independent  Assistive Device: none  Bathing/Grooming: Independent  Meal Prep: With assistance  Able to feed self?: Yes  Household maintenance/chores: With assistance  Able to drive?: Yes    Dialysis:  Dialysis Status: HD  Where: [facility]  When: TTS  Transport: patient  drives     Income Information:  Vocational: Retired  Insurance underwriter Information: [employer], Medicare  Prescription Coverage: and has  Served in [place] military: Yes  Service Connected: No  Is patient OPWDD connected? : No  Is the patient presumed eligible for OPWDD services?: No      Darlyn Chamber, LMSW   4:04 PM on 01/13/2018

## 2018-01-13 NOTE — Progress Notes (Signed)
Dialysis progress note 01/13/2018 3:18 PM    Subjective:  No acute events overnight. Patient seen and examined in room. Laying in bed. No complaints voiced. Scheduled for fistulagram today with IR for RUE AVG severe stenosis.     Objective:  Vitals:    01/13/18 1319 01/13/18 1327 01/13/18 1334 01/13/18 1506   BP: 118/50 (!) 114/48 (!) 114/49 125/60   BP Location:    Left arm   Pulse: 65 66 56 88   Resp: 14 14 14 16    Temp:    36.4 C (97.5 F)   TempSrc:    Temporal   SpO2: 98% 96% 100% 98%   Weight:       Height:         Intake/Output last 3 shifts:No intake/output data recorded.    QQV:ZDGLO in NAD  Eyes: anicteric sclerae  Mouth: Moist and pink   CVS: RRR, no rub, no gallop,no murmur   Lungs: Clear anterolaterally   Abd: Soft non tender non distended, + BS   Ext: Edema none   RUE AVF + thrill and bruit noted   Neuro: Awake Alert       Recent Labs  Lab 01/13/18  0313 01/12/18  0537 01/11/18  2042 01/11/18  0120 01/10/18  0058   Sodium 133 132*  --  135 137   Potassium 5.1 5.4* 5.1 6.4* 6.4*   Chloride 89* 89*  --  92* 92*   CO2 30* 30*  --  29* 33*   UN 19 25*  --  21* 29*   Creatinine 5.84* 7.58*  --  6.66* 8.70*   Glucose 201* 92  --  112* 80   Calcium 9.3 9.7  --  9.5 9.3   Albumin  --   --   --  4.1 3.7   Phosphorus  --  7.3*  --  6.5* 7.4*     No results found for: PTH, VID25   Recent Labs  Lab 01/12/18  0537 01/11/18  0120 01/10/18  0058   WBC 8.4 7.9 7.3   Hemoglobin 9.9* 9.9* 9.2*   Hematocrit 30* 30* 27*   Platelets 134* 140* 136*         Component Value Date/Time    FE 43 (L) 01/12/2018 0537    IBC 221 (L) 01/12/2018 0537    FESAT 19 (L) 01/12/2018 0537    FER 659 (H) 01/12/2018 0537        Ir Angiography Av Dialysis Shunt    Result Date: 01/13/2018  Fistulogram demonstrating interval areas of stenoses, as described above. Most of them were successfully angioplastied, however there is a focal area of focal extrinsic compression, which correlates with a palpable hard abnormality is near the distal dialysis  site, which may represent scar tissue. [facility] submits this DICOM format image data and final report to the [facility], an independent secure electronic health information exchange, on a reciprocally searchable basis (with patient authorization) for a minimum of 12 months after exam date.    US Doppler Hemodialysis Graft/fistula    Result Date: 01/12/2018  Right upper extremity arteriovenous graft with a severe stenosis 10cm beyond the anastomosis, near the origin of the stent grafts. This interpretation is based on The Society for Vascular Surgery: Clinical practice guidelines for the surgical placement and maintenance of arteriovenous hemodialysis access. 2008 Findings discussed with NP Terboss  by Dr. Alessandra Bevels at 17:31 01/12/2018. END OF IMPRESSION.  Assessment/Plan  RAKESH DUTKO is a 72 y.o. Male   ESRD on HD admitted for hyperkalemia induced weakness.  Etiology of hyperkalemia may be related to dietary indiscretion.  Bleeding is also a concern, but given hgb stability seems less likely.    Plan:  1. Hemodialysis Note:   - HD tomorrow   - Follow CBC   - Dose adjust medications for CrCl< 10   - Low potassium diet please   - Nephrovite     2. Anemia: Goal hemoglobin 10-11 g/dl   Hgb as above  Hold on ESA for now   Follow CBC     3. CKD-MBD:      Calcium as above   Continue Sevelamer   Low phos diet     4. Hypertension:   BP as above. Well controlled continue home meds.     5. Access:  - AVF   - Fistula gram performed today for severe stenosis to the anastomosis. - Successful fistulagram      Thank you for allowing [place] to participate in the care of this patient  Please call with questions, attending recommendations to follow.     Author: Eugenio Hoes, NP  as of: 01/13/2018  at: 3:18 PM   Pager [phone]

## 2018-01-13 NOTE — Invasive Procedure Plan of Care (Signed)
Invasive Procedure Plan of Care (Consent Form 419):   Condition(s) Addressed: Narrowed or blocked blood vessels/Aneurysm   Performing Provider: Alcide Clever, and Vascular and Interventional Radiology Attendings, Fellows, Residents and Advanced Practice Providers     Side:    Procedure: Image guided evaluation of blood vessels. Vessel dilation, stenting, blocking off of vessels and dissolving blood clots as needed   Special Equipment:    Planned Anesthesia: Pending   Benefits: Assessment of fistula patency, therapy if necessary   Risks: Bleeding, infection, shock, stroke, allergic reaction to contrast dye, injury to blood vessels and nerves, and in very rare circumstances death.   Alternatives: Not to perform the procedure   Expected Length of Stay: 0 day(s)     I, or a designated member of my surgical team, have discussed the planned procedure, including the potential for any transfusion of blood products or receipt of tissue as necessary, expected benefits, the potential complications and risks and possible alternatives and their benefits and risks with the patient or the patient's surrogate. In my opinion, the patent or the patient's surrogate understands the proposed procedure, its risks, benefits, and alternatives.    Electronically signed by Alcide Clever, MD at 12:01 PM     Patient Consent:  I hereby give my consent and authorize Alcide Clever, and Vascular and Interventional Radiology Attendings, Fellows, Residents and Advanced Practice Providers    (The list of possible assistants, all of whom are privileged to provide surgical services at the hospital, is available)  To treat the following: Narrowed or blocked blood vessels/Aneurysm  Procedure includes: Image guided evaluation of blood vessels. Vessel dilation, stenting, blocking off of vessels and dissolving blood clots as needed  Laterality:   1 The care provider has explained my condition to me, the benefits of having the above treatment procedure,  and alternate ways of treating my condition. I understand that no guarantees have been made to me about the result of the treatment. The alternatives to this procedure include: Not to perform the procedure   2 The care provider has discussed with me the reasonably foreseeable risks of the treatment and that there may be undesirable results. The risks that are specifically related to this procedure include: Bleeding, infection, shock, stroke, allergic reaction to contrast dye, injury to blood vessels and nerves, and in very rare circumstances death.   3 I understand that during the treatment a condition may be discovered which was not known before the treatment started. Therefore, I authorize the care provider to perform any additional or different treatment which is thought necessary and available.   4 Any tissue, parts, or substances removed during the procedure may be retained or disposed of in accordance with customary scientific, educational and clinical practice.   5 Vendor information if appropriate:    6 If blood products are needed, I would agree:    7 If tissue products are needed, I would agree:    8 Blood/Tissue use limitations and/or exclusions:       I have carefully read and fully understand this informed consent form, and have had sufficient opportunity to discuss my condition and the above procedure(s) with the care provider and his/her associates, and all of my questions have been answered to my satisfaction. I understand that my surgeon/provider performing the procedure may not be physically present in the operating/procedure room the entire time that I am there. My surgeon/provider has answered my questions regarding this and how it may relate to my surgery/procedure. I agree  to the Plan of Care as outlined above.                       Patient Signature   (or Parent/Legal Guardian if pt is unable to sign or is a minor)  Date/Time     Electronic Signatures will display at the bottom of the consent  form.

## 2018-01-13 NOTE — Plan of Care (Signed)
Cognitive function     Cognitive function will be maintained or return to baseline Maintaining        Mobility     Patient's functional status is maintained or improved Maintaining        Nutrition     Patient's nutritional status is maintained or improved Maintaining        Pain/Comfort     Patient's pain or discomfort is manageable Maintaining        Psychosocial     Demonstrates ability to cope with illness Maintaining        Safety     Patient will remain free of falls Maintaining     Prevent any intentional injury Maintaining          Assumed pt care at 1900. Pt NPO at [place] for AV shunt procedure. Sisters came in to visit pt in evening time. Pt requested to have BG taken at midnight and was 134. Pt resting in bed. Will continue to monitor.    Melvenia Beam, RN

## 2018-01-13 NOTE — Invasive Procedure Plan of Care (Signed)
Invasive Procedure Plan of Care (Consent Form 419):   Condition(s) Addressed: Need for moderate sedation during procedure/minor surgery.   Performing Provider: Alcide Clever   Side: Not applicable    Procedure: Establishment of moderate sedation.   Special Equipment: None   Planned Anesthesia:    Benefits: State of altered consciousness created by administration of intravenous medications (medications in an IV).  Patients under moderate sedation will feel sleepy/relaxed, but will respond to commands and may have memory of the procedure.  The purpose of moderate sedation is to make you feel comfortable during your procedure, and can make your procedure easier to perform.     Risks: Excessive sedation, which may require special procedures to keep you safe, including placement of a breathing tube.  Nausea, vomiting, problems with heart rate or blood pressure, breathing difficilties, very rarely death. Occasionally, vivid dreams or agitation requiring additional medication.   Alternatives: Not undergoing procedure, undergoing procedure without sedation.   Expected Length of Stay:  day(s)     I, or a designated member of my surgical team, have discussed the planned procedure, including the potential for any transfusion of blood products or receipt of tissue as necessary, expected benefits, the potential complications and risks and possible alternatives and their benefits and risks with the patient or the patient's surrogate. In my opinion, the patent or the patient's surrogate understands the proposed procedure, its risks, benefits, and alternatives.    Electronically signed by Alcide Clever, MD at 12:03 PM     Patient Consent:  I hereby give my consent and authorize Alcide Clever  (The list of possible assistants, all of whom are privileged to provide surgical services at the hospital, is available)  To treat the following: Need for moderate sedation during procedure/minor surgery.  Procedure includes: Establishment  of moderate sedation.  Laterality: Not applicable  1 The care provider has explained my condition to me, the benefits of having the above treatment procedure, and alternate ways of treating my condition. I understand that no guarantees have been made to me about the result of the treatment. The alternatives to this procedure include: Not undergoing procedure, undergoing procedure without sedation.   2 The care provider has discussed with me the reasonably foreseeable risks of the treatment and that there may be undesirable results. The risks that are specifically related to this procedure include: Excessive sedation, which may require special procedures to keep you safe, including placement of a breathing tube.  Nausea, vomiting, problems with heart rate or blood pressure, breathing difficilties, very rarely death. Occasionally, vivid dreams or agitation requiring additional medication.   3 I understand that during the treatment a condition may be discovered which was not known before the treatment started. Therefore, I authorize the care provider to perform any additional or different treatment which is thought necessary and available.   4 Any tissue, parts, or substances removed during the procedure may be retained or disposed of in accordance with customary scientific, educational and clinical practice.   5 Vendor information if appropriate: If a vendor representative is expected to be present during my procedure, it has been explained to me that the vendor representative works for (manufacturer of the device to be used) and that his/her role includes . I consent to the vendor representative's presence and involvement as described. If circumstances change and a decision is made during my procedure that a vendor representative's presence is needed, I will be notified of the above after my procedure is completed.  Equipment:  None   6 If blood products are needed, I would agree:    7 If tissue products are needed, I  would agree:    8 Blood/Tissue use limitations and/or exclusions:       I have carefully read and fully understand this informed consent form, and have had sufficient opportunity to discuss my condition and the above procedure(s) with the care provider and his/her associates, and all of my questions have been answered to my satisfaction. I understand that my surgeon/provider performing the procedure may not be physically present in the operating/procedure room the entire time that I am there. My surgeon/provider has answered my questions regarding this and how it may relate to my surgery/procedure. I agree to the Plan of Care as outlined above.                       Patient Signature   (or Parent/Legal Guardian if pt is unable to sign or is a minor)  Date/Time     Electronic Signatures will display at the bottom of the consent form.

## 2018-01-13 NOTE — Progress Notes (Signed)
HMD PA Note: Patient off floor for fistulogram- there is still one tight place that is likely scar tissue. DIet resumed . Currently he is sleeping, I deferred exam at this time as has been examined by multiple providers already today and no acute issues.   RE: DM---A1c tightly controlled-will  DEC lantus to 4 units for now for  Now and let him ride between 120-130. BG last eve 112 and doesn't need to be that tight of  control.  BP 125/60 (BP Location: Left arm)    Pulse 88    Temp 36.4 C (97.5 F) (Temporal)    Resp 16    Ht 1.829 m (6')    Wt 63.5 kg (140 lb 1.6 oz)    SpO2 98%    BMI 19.00 kg/m     Recent Results (from the past 24 hour(s))   POCT glucose    Collection Time: 01/12/18  6:57 PM   Result Value Ref Range    Glucose POCT 273 (H) 60 - 99 mg/dL   POCT glucose    Collection Time: 01/12/18  9:24 PM   Result Value Ref Range    Glucose POCT 112 (H) 60 - 99 mg/dL   POCT glucose    Collection Time: 01/13/18 12:11 AM   Result Value Ref Range    Glucose POCT 134 (H) 60 - 99 mg/dL   Basic metabolic panel    Collection Time: 01/13/18  3:13 AM   Result Value Ref Range    Glucose 201 (H) 60 - 99 mg/dL    Sodium 133 133 - 145 mmol/L    Potassium 5.1 3.3 - 5.1 mmol/L    Chloride 89 (L) 96 - 108 mmol/L    CO2 30 (H) 20 - 28 mmol/L    Anion Gap 14 7 - 16    UN 19 6 - 20 mg/dL    Creatinine 5.84 (H) 0.67 - 1.17 mg/dL    GFR,Caucasian 9 (!) *    GFR,Black 10 (!) *    Calcium 9.3 8.6 - 10.2 mg/dL   POCT glucose    Collection Time: 01/13/18  8:58 AM   Result Value Ref Range    Glucose POCT 151 (H) 60 - 99 mg/dL   POCT glucose    Collection Time: 01/13/18  1:52 PM   Result Value Ref Range    Glucose POCT 121 (H) 60 - 99 mg/dL     Active Hospital Problems    Diagnosis Date Noted    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 Most of them were successfully angioplastied, however there is  a focal area of focal extrinsic compression, which correlates with a palpable hard abnormality is near the distal dialysis site which may represent scar tissue.            Generalized weakness 01/12/2018     Priority: High    ESRD (end stage renal disease) on dialysis 03/27/2017     Priority: Medium     Chronic    DM type 2 (diabetes mellitus, type 2) 03/27/2017     Priority: Medium     Chronic    Hyperkalemia 01/09/2018     Priority: Low    HTN (hypertension) 08/30/2011     Chronic     Overview:   Well controlled off medication s/p weight loss    Last  Assessment & Plan:   B/P 120's/40's, MAP 70's-80's day of discharge    Plan:  No previous BP home medications  Monitor B/P        Resolved Hospital Problems    Diagnosis Date Noted Date Resolved   No resolved problems to display.     Plan:  1. dm2 : decreased lantus to 4 units, no change to SSI  2. ESRD: plan per renal  AVF: post plasty with residual scar tissue in place  3. Hyperkalemia- low K diet, would continue daily BMP  Can treat with valtessa,  Insulin + dextrose  Rest of plan per detailed attending note  Coral Spikes, [place]  17:58

## 2018-01-14 DIAGNOSIS — K9413 Enterostomy malfunction: Secondary | ICD-10-CM | POA: Diagnosis present

## 2018-01-14 LAB — BASIC METABOLIC PANEL
Anion Gap: 18 — ABNORMAL HIGH (ref 7–16)
CO2: 26 mmol/L (ref 20–28)
Calcium: 9.3 mg/dL (ref 8.6–10.2)
Chloride: 85 mmol/L — ABNORMAL LOW (ref 96–108)
Creatinine: 9.71 mg/dL — ABNORMAL HIGH (ref 0.67–1.17)
GFR,Black: 6 * — AB
GFR,Caucasian: 5 * — AB
Glucose: 298 mg/dL — ABNORMAL HIGH (ref 60–99)
Lab: 42 mg/dL — ABNORMAL HIGH (ref 6–20)
Potassium: 5.9 mmol/L — ABNORMAL HIGH (ref 3.3–5.1)
Sodium: 129 mmol/L — ABNORMAL LOW (ref 133–145)

## 2018-01-14 LAB — CBC
Hematocrit: 30 % — ABNORMAL LOW (ref 40–51)
Hemoglobin: 9.8 g/dL — ABNORMAL LOW (ref 13.7–17.5)
MCH: 31 pg/cell (ref 26–32)
MCHC: 33 g/dL (ref 32–37)
MCV: 92 fL (ref 79–92)
Platelets: 149 10*3/uL — ABNORMAL LOW (ref 150–330)
RBC: 3.2 MIL/uL — ABNORMAL LOW (ref 4.6–6.1)
RDW: 15.1 % — ABNORMAL HIGH (ref 11.6–14.4)
WBC: 8 10*3/uL (ref 4.2–9.1)

## 2018-01-14 LAB — UREA NITROGEN, POST DIALYSIS: UN post Dialysis: 13 mg/dL (ref 6–20)

## 2018-01-14 LAB — POCT GLUCOSE
Glucose POCT: 245 mg/dL — ABNORMAL HIGH (ref 60–99)
Glucose POCT: 199 mg/dL — ABNORMAL HIGH (ref 60–99)
Glucose POCT: 339 mg/dL — ABNORMAL HIGH (ref 60–99)

## 2018-01-14 LAB — UREA NITROGEN, PRE DIALYSIS: UN Pre Dialysis: 47 mg/dL — ABNORMAL HIGH (ref 6–20)

## 2018-01-14 MED ORDER — SODIUM CHLORIDE 0.9 % 100 ML IV SOLN *I*
5.0000 mg | Freq: Once | INTRAVENOUS | Status: DC
Start: 2018-01-14 — End: 2018-01-14

## 2018-01-14 NOTE — Plan of Care (Signed)
Cognitive function     Cognitive function will be maintained or return to baseline Maintaining        Mobility     Patient's functional status is maintained or improved Maintaining        Nutrition     Patient's nutritional status is maintained or improved Maintaining        Pain/Comfort     Patient's pain or discomfort is manageable Maintaining        Psychosocial     Demonstrates ability to cope with illness Maintaining        Safety     Patient will remain free of falls Maintaining     Prevent any intentional injury Maintaining          Assumed care at 1900. Pt A&Ox3, VSS w no complaints of pain. No acute events noted overnight. Pt currently resting comfortably in bed. Will continue to monitor. Olegario Shearer, RN

## 2018-01-14 NOTE — Progress Notes (Signed)
Nephrology  01/14/2018 2:00 PM    Victor Flowers was seen on dialysis.    Interval Events:    Had fistulogram yesterday with plasty.  Residual stenosis near the anastomosis persists due to scar tissue.  In addition, patient is considering abdominal surgery for creation of a permanent ostomy.      Objective:  Blood pressure 98/78, pulse 55, temperature 36.6 C (97.9 F), resp. rate 16, height 1.829 m (6'), weight 64.7 kg (142 lb 10.2 oz), SpO2 100 %.    NID:POEUM, alert, oriented ; lucid; NAD  Eyes:  Conj pale; gaze conjugate; no scleral  iceterus  ENT:  OP clear with MMM  Chest:  Clear with normal effort  CVS: reg; no rub; no edema; An AVF is presents and has needles in place; AVF is a bit high pitched, but continuous.  Ab: soft, nt, ostomy in place  Skin:  No rashes or ulcers on visible skin  Neuro:  Alert, oriented, but mild lethargy, memory appears to be intact, affect is flat        Recent Labs  Lab 01/14/18  0447 01/13/18  0313 01/12/18  0537  01/11/18  0120 01/10/18  0058   Sodium 129* 133 132*  --  135 137   Potassium 5.9* 5.1 5.4*  < > 6.4* 6.4*   Chloride 85* 89* 89*  --  92* 92*   CO2 26 30* 30*  --  29* 33*   UN 42* 19 25*  --  21* 29*   Creatinine 9.71* 5.84* 7.58*  --  6.66* 8.70*   Glucose 298* 201* 92  --  112* 80   Calcium 9.3 9.3 9.7  --  9.5 9.3   Albumin  --   --   --   --  4.1 3.7   Phosphorus  --   --  7.3*  --  6.5* 7.4*   < > = values in this interval not displayed.     Recent Labs  Lab 01/14/18  0447 01/12/18  0537 01/11/18  0120   WBC 8.0 8.4 7.9   Hemoglobin 9.8* 9.9* 9.9*   Hematocrit 30* 30* 30*   Platelets 149* 134* 140*          Ir Angiography Av Dialysis Shunt    Result Date: 01/13/2018  Fistulogram demonstrating interval areas of stenoses, as described above. Most of them were successfully angioplastied, however there is a focal area of focal extrinsic compression, which correlates with a palpable hard abnormality is near the distal dialysis site, which may represent scar tissue. UR  Imaging submits this DICOM format image data and final report to the [facility], an independent secure electronic health information exchange, on a reciprocally searchable basis (with patient authorization) for a minimum of 12 months after exam date.    US Doppler Hemodialysis Graft/fistula    Result Date: 01/12/2018  Right upper extremity arteriovenous graft with a severe stenosis 10cm beyond the anastomosis, near the origin of the stent grafts. This interpretation is based on The Society for Vascular Surgery: Clinical practice guidelines for the surgical placement and maintenance of arteriovenous hemodialysis access. 2008 Findings discussed with NP Terboss  by Dr. Alessandra Bevels at 17:31 01/12/2018. END OF IMPRESSION.           Assessment/Plan  Victor Flowers is a 72 y.o. Male  ESRD on HD admitted for hyperkalemia induced weakness.  Etiology of hyperkalemia may be related to dietary indiscretion.      Plan:  1. Hemodialysis Note:   Presently dialyzing:     F180 Na 138 K 1 Ca 2.5 HCO3 35 Qb: 400 ml/min Qd: 2 X Qb UF 2 today      EDW = 63 kg.   Low potassium, low sodium diet please   Dose all medications for creatinine clearance < 10 ml/min    Limit beverage intake to 1.5 L per day   Give Nephrovite or similar renal vitamin daily    2.  Anemia:   hgb acceptable.  Hold on additional ESA.    3.  Hyperparathyroidism:    Low phos diet.  Continue Renvela.  Re-check phos please.    4.  Hypertension:   Blood pressure is controlled.  Continue current medication regimen.     5.  Access: Continues to have high negative arterial pressures limiting BFR ~ 350.  This should be sufficient to provide adequate treatments.      6.  Hyperkalemia:   Low K diet.   URR pending.     Will use 1 K bath given reduced BFR and DFR=800.    Author: Fernande Bras, MD  as of: 01/14/2018  at: 2:00 PM

## 2018-01-14 NOTE — Progress Notes (Signed)
Writer attempted to meet with patient to complete SNF packet for rehab and discuss outpatient dialysis however patient was off the unit.  SW will continue to follow.    Darlyn Chamber, [place]

## 2018-01-14 NOTE — Progress Notes (Signed)
Report Given To  01/14/2018  Pt had stable 3 hr 45 min HD tx. Net fluid removal 2L.        Descriptive Sentence / Reason for Admission   Duration of Treatment (minutes): 400 minutes  Complications: none        Active Issues / Relevant Events   Dialysis weight change (kg): -2 kg  Review of medications administered  such as routine meds, antibiotics, and heparin: none  Vital Signs: see flow sheet        To Do List  Medications still needing administration: none dialysis  Dressing change due : Please remove dressing from the Right AVG approximately 6 hrs post HD treatment.      Anticipatory Guidance / Discharge Planning  Date/Time of Next Treatment: 01/16/2018  Weigh patient daily in order to monitor fluid balance.  Bonnita Newby, RN

## 2018-01-14 NOTE — Plan of Care (Signed)
Problem: Impaired Bed Mobility  Goal: LTG - IMPROVE BED MOBILITY  Patient will perform bed mobility without rails and the head of bed flat with No assist (Independently)     Time frame: 5-7 days    Problem: Impaired Transfers  Goal: LTG - IMPROVE TRANSFERS  Patient will complete Sit to stand transfers using least restrictive assistive device with No assist (Independently)     Time frame: 5-7 days    Problem: Impaired Ambulation  Goal: LTG - IMPROVE AMBULATION  Patient will ambulate 150-299 feet using least restrictive assistive device with No assist (Independently)    Time frame: 5-7 days    Problem: Impaired Stair Navigation  Goal: LTG - IMPROVE STAIR NAVIGATION  Patient will navigate 10 steps with 1  rail(s) and Modified independence     Time frame: 5-7 days

## 2018-01-14 NOTE — Progress Notes (Signed)
Hospital Medicine Service Attending Progress Note    Significant Events/Subjective:   Patient reports that he feels weak today.  He denies dyspnea, nausea.    Objective:     Physical Exam  Temp:  [35.7 C (96.3 F)-36.6 C (97.9 F)] 35.7 C (96.3 F)  Heart Rate:  [53-88] 55  Resp:  [12-24] 18  BP: (114-149)/(48-68) 149/68  64.7 kg (142 lb 11.2 oz)    Constitutional: sitting up in bed, in no acute distress   HENT: anicteric, severe clouding of right eye  CV: RRR  Resp: normal work of breathing, on room air, lungs CTAB  GI: soft, non-tender  Skin/Lines: warm, dry, RUE AVF  Neuro: awake, alert    Recent Lab, Micro, and Imaging Studies   Personally reviewed and notable for:    K 5.9  CO2 26  Na 129    Fistulogram: Fistulogram demonstrating interval areas of stenoses, as described above. Most of them were successfully angioplastied, however there is a focal area of focal extrinsic compression, which correlates with a palpable hard abnormality is near   the distal dialysis site, which may represent scar tissue.    Current Inpatient Medications:   insulin glargine  4 Units Subcutaneous Nightly    dorzolamide  1 drop Left Eye 2 times per day    timolol  1 drop Left Eye 2 times per day    latanoprost  1 drop Left Eye Nightly    sevelamer  1.6 g Oral TID WC    b complex-vitamin c-folic acid  1 tablet Oral Daily    pantoprazole  40 mg Oral Daily    insulin lispro  0-30 Units Subcutaneous TID WC     melatonin, acetaminophen, Nursing communication- Give 4 OZ of fruit juice for BG < 70 mg/dl **AND** dextrose **AND** dextrose **AND** glucagon **AND** POCT glucose    Assessment:     72 y.o. male with a history of ESRD on HD, HTN, DM2, prostate ca s/p TURP, FAP s/p subtotal colectomy, GERD c/b Barrett esophagus who presented with severe hyperkalemia in the setting of missed hemodialysis.    Plans:     ESRD/hyperkalemia/hyperphosphatemia:  - Etiology of hyperkalemia appears missed HD and poor dietary adherence.  - K  improved but still elevated with dietary restriction and multiple HD sessions.  - Fistulagram yesterday with mostly successful angioplasty although unable to open area with extrinsic compression.  - Continue low K diet.  - Appreciate nephrology assistance.    HTN:  - By history.  He does not appear to be on antihypertensive meds.  Patient with intermittent hypotension with HD.  Would not plan to start BP meds.    DM2:  - Having relatively low BGs despite significantly decreased insulin doses compared to home regimen.  - Continue Lantus 4u nightly.  On 35u Levemir at home.  - Continue Humalog TID AC with 0u baseline.  On 12u baseline at home.  - Concern for excessively strict glycemic control at home likely causing hypoglycemia.  A1c in March (via [facility] Every[facility]) of 5.6.    Possible cognitive decline:  - B12 and TSH wnl.  - Holding gabapentin.  - PT/OT.    Glaucoma:  - Continue gtts.    GERD/Barrett esophagus:  - Continue PPI.    FAP s/p colectomy:  - Patient having pain with catheterization of Kock pouch.  CRS advising possible conversion to end ileostomy which patient is considering.  - Appreciate CRS, WOCN assistance.    DVT PPx: SCDs (  hx of HIT)    Code Status: Full Code    Discharge Planning:   Est. Discharge Date (EDD): 6/6  Discharge Criteria/Barriers to Discharge: HD, placement  PT and/or OT Recommendations/Discharge to: SNF  Appointments Needed with: TBD     Case discussed with APP, RN, SW, CC, PT.    Wynona Meals, MD on 01/14/2018 at 8:41 AM

## 2018-01-14 NOTE — Progress Notes (Signed)
Physical Therapy Initial Evaluation:     Victor Flowers is currently functioning below his baseline and may require SNF rehab before returning to he prior living arrangement unless he is able to demonstrate full independence. he will likely return to his prior level of functioning with a short term rehab stay. However, if discharge directly to home is desired, Victor Flowers will need 24 hour assistance with all mobility and require Home PT     PT Discharge Equipment Recommended: To be determined if returning to their prior living environment today.    History of Present Admission:     72 y.o. male with a history of ESRD on HD, HTN, DM2, prostate ca s/p TURP, FAP s/p subtotal colectomy, GERD c/b Barrett esophagus who presented with severe hyperkalemia in the setting of missed hemodialysis.    Past Medical History:   Diagnosis Date    Barrett's esophagus     BPH (benign prostatic hyperplasia)     ESRD needing dialysis     FAP (familial adenomatous polyposis)     HLD (hyperlipidemia)     HTN (hypertension)         Past Surgical History:   Procedure Laterality Date    IR ANGIOGRAPHY AV DIALYSIS SHUNT N/A 01/13/2018    IR ANGIOGRAPHY AV DIALYSIS SHUNT 01/13/2018 Alcide Clever, MD [facility]/ANG    PR TEMPORAL ARTERY LIGATN OR BX N/A 03/28/2017    Procedure: TEMPORAL ARTERY BIOPSY;  Surgeon: Anselmo Rod, MD;  Location: [facility] MAIN OR;  Service: Ophthalmology       Personal factors affecting treatment/recovery:   Lives alone   Poor health literacy    Comorbidities affecting treatment/recovery:   End stage renal disease on hemodialysis (ESRD)    Clinical presentation:   stable    Patient complexity:     low level as indicated by above stability of condition, personal factors, environmental factors and comorbidities in addition to their impairments found on physical exam.     01/14/18 1100   Prior Living    Prior Living Situation Reported by patient   Lives With Family   Type of [place]   # Steps to [place] 2   # Rails to [place] in Home None   Additional Comments Patient reports he is visiting from [place] and his family is trying to get him to move here permenently   Prior Function Level   Prior Function Level Reported by patient   Walking Independent   Walking assistive devices used None   Additional Comments Patient has been driving   [place] Number   Visit Number [facility]) / Treatment Day ([place]) 1   Visit Details [facility])   Visit Type [facility]) Evaluation   Reason for visit [facility]) General   Precautions/Observations   Precautions used Yes   LDA Observation None   Fall Precautions General falls precautions   Other patient cooperative   Pain Assessment   *Is the patient currently in pain? Yes   Pain (Before,During, After) Therapy Before;During;After   0-10 Scale 8   Pain Location Shoulder;Arm   Pain Orientation Left   Pain Descriptors Dull;Sore   Pain Intervention(s) Repositioned;Refer to nursing for pain management;Heat applied  (declined cold)   Additional comments Patient reports left upper arm pain started yesterday and is even bothering him at rest. A quick exam seems to point towards RTC muscular issue (? impingement)  Vision    Current Vision No visual deficits   Cognition   Cognition Tested   Arousal/Alertness Appropriate responses to stimuli   Orientation A&Ox3   Following Commands Follows simple commands without difficulty   Additional Comments Patient did seem to have some poor insight into his medical issues and need to be compliant with all aspects of care. He did not think missing dialysis or not having all his meds with him was a concern   UE Assessment   UE Assessment Full range RUE AROM;Impaired LUE AROM;Impaired LUE PROM   LUE (degrees)   Shoulder Flex  0-170 120  (PROM- only 80 with AROM due to pain)   Shoulder ABduction 0-140 90  (passive)   Shoulder Int Rotation  0-70 left hip   Shoulder Ext Rotation  0-90 50  (PROM)   LUE Strength   Shoulder Flex  2/5  (limited by pain)   Shoulder ABduction 2/5   (limited by pain)   Shoulder Int Rotation 3/5      Shoulder Ext Rotation 3-/5     Elbow Flex 4/5     Elbow Ext 4/5     Grip Strength 4/5   LE Assessment   LE Assessment Full AROM RLE;Full AROM LLE   Sensation   Sensation No apparent deficit   Additional Comments Suspect some neuropathy but no formal testing performed, LT intact   Bed Mobility   Bed mobility Tested   Supine to Sit Modified independent (device);Side rails up (#);Head of bed elevated   Sit to Supine Verbal cues;Head of bed flat   Additional comments Patient unable to use LUE to assist due to pain.    Transfers   Transfers Tested   Sit to [employer] guard;Verbal cues   Stand to sit Contact guard;Verbal cues   Additional comments Patient stood from bedside with initial steadying. Patient reports, "what do you exect when you just woke me up"   Mobility   Mobility Tested   Gait Pattern Decreased cadence;Unsteady;Lateral sway   Ambulation Assist 1 person assist;Contact guard   Ambulation Distance (Feet) 120'   Ambulation Assistive Device None   Additional comments Patient was generally unsteady but no overt LOB. CG assist provided due to trunk sway   Therapeutic Exercises   Additional comments Patient instructed in L UE Shoulder ER using orange theraband in short arc of motion   Balance   Balance Tested   Sitting - Static Independent    Sitting - Dynamic Supported   Standing - Static Verbal cues;Unsupported   Standing - Dynamic Contact guard;Supported;Verbal cues   PT AM-PAC Mobility   Turning over in bed? 1   Sitting down on and standing up from a chair with arms? 1   Moving from lying on back to sitting on the side of the bed? 1   Moving to and from a bed to a chair? 3   Need to walk in hospital room? 3   Climbing 3 - 5 steps with a railing? 1   Total Raw Score 10   Assessment   Brief Assessment Appropriate for skilled therapy   Problem List Impaired UE strength;Impaired UE ROM;Poor  safety/judgement;Impaired balance;Impaired functional status;Pain contributing to impairment   Patient / Family Goal "go home"   Additional Comments Patient is currently needing some assist with mobility but hope that balance and strength improve as his potassium is in a better range. SW indicates sister is not wanting him to return home with her so will  need to determine where he will go at d/c.    Plan/Recommendation   Treatment Interventions Restorative PT;Assess functional mobility;Pt/Family education;AROM;Bed mobility training;Transfers training;Balance training;Gait training;Strengthening;D/C planning;Will work to minimize pain while promoting mobility whenever possible   PT Frequency 2-4x/wk   Mobility Recommendations 1 assist for ambulation in hallway   Discharge Recommendations [place]  (unless regains full independence)   PT Discharge Equipment Recommended To be determined   Assessment/Recommendations Reviewed With: Care coordinator;Nursing;Patient;Social Worker;Physician;Midlevel provider   Next PT Visit work on balanceand ambulation. Stairs before d/c   Time Calculation   PT Timed Codes 0   PT Untimed Codes 24   PT Unbilled Time 0   PT Total Treatment 24   Plan and Onset date   Plan of Care Date 01/14/18   Onset Date 01/09/18   Treatment Start Date 01/14/18   Morey Hummingbird A. Ronney Lion, PT  Pager [phone]

## 2018-01-14 NOTE — Progress Notes (Signed)
HMD PA Note: chart meds reviewed.  Saw patient at 17:52 he was dozing, easily awakens. Tells me he isn't good as during dialysis his ostomy came apart "and I soiled myself". Also tells me he walked to bathroom earlier (by himself ) and felt weak and dizzy. Currently is not dizzy but complains of neck pain inferior to right occiput. Tells me he isn't doing well. BG stable and  1. Appreciate CRS input on plan for surgery ( on hold while he thinks about it) and he will follow up 7/5 with them outpatient.  2. Hyperkalemia: getting HD today, consider starting [place]    BP 119/63    Pulse 55    Temp 36.6 C (97.9 F)    Resp 16    Ht 1.829 m (6')    Wt 64.7 kg (142 lb 10.2 oz)    SpO2 99%    BMI 19.35 kg/m    BP 104/66    Pulse 55    Temp 36.6 C (97.9 F)    Resp 16    Ht 1.829 m (6')    Wt 62.7 kg (138 lb 3.7 oz)    SpO2 100%    BMI 18.75 kg/m     Exam: Gen appears fatigued and looks like he is not feeling good. Odor of vomit in the room. Asking for new gown.  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 Dizziness: eval for orthostatic in am, BP is stable now, encourage PO  2. Weakness: at high fall risk : low bed, ordered bed chair alarm as in B Bed and may have gotten up by himself without  calling  Fall prec  3. Ileostomy dysfunction of Kock pouch: he is thinking about further surgery, plan on hold right now, outpatient CRS follow  Up made  Patient manages own ostomy   4. Hyperkalemia: recheck BMP in am, had HD today so should be stable now  5. ESRD on HD, AVF stenosis post plasty   PLAN PER RENAL  6. DM2:no change to current regimen , lowest BG is 199, stable   Rest of plan per attending note  Discussed plan w attending earlier, updated RN  Victor Flowers, Victor Flowers, [name]  17:36

## 2018-01-14 NOTE — Progress Notes (Signed)
Brief Colorectal Surgery Note:     Patient with Victor Flowers pouch that is currently not functioning well. From the surgical standpoint the only option is to convert the pouch to a permanent ileostomy. Discussed this with patient. He needs more time to think about this and will follow up in clinic to have a more detailed discussion. An appointment was made with Dr. Tinnie Gens on 02/13/18. Please page the colorectal surgery service with questions or concerns.     Elmer Sow, MD  10:17 AM

## 2018-01-15 ENCOUNTER — Other Ambulatory Visit: Payer: Self-pay | Admitting: Cardiology

## 2018-01-15 DIAGNOSIS — R9431 Abnormal electrocardiogram [ECG] [EKG]: Secondary | ICD-10-CM

## 2018-01-15 LAB — BASIC METABOLIC PANEL
Anion Gap: 17 — ABNORMAL HIGH (ref 7–16)
Anion Gap: 16 (ref 7–16)
CO2: 28 mmol/L (ref 20–28)
CO2: 26 mmol/L (ref 20–28)
Calcium: 9.9 mg/dL (ref 8.6–10.2)
Calcium: 9.9 mg/dL (ref 8.6–10.2)
Chloride: 85 mmol/L — ABNORMAL LOW (ref 96–108)
Chloride: 84 mmol/L — ABNORMAL LOW (ref 96–108)
Creatinine: 6.82 mg/dL — ABNORMAL HIGH (ref 0.67–1.17)
Creatinine: 7.65 mg/dL — ABNORMAL HIGH (ref 0.67–1.17)
GFR,Black: 8 * — AB
GFR,Black: 7 * — AB
GFR,Caucasian: 7 * — AB
GFR,Caucasian: 6 * — AB
Glucose: 225 mg/dL — ABNORMAL HIGH (ref 60–99)
Glucose: 352 mg/dL — ABNORMAL HIGH (ref 60–99)
Lab: 31 mg/dL — ABNORMAL HIGH (ref 6–20)
Lab: 39 mg/dL — ABNORMAL HIGH (ref 6–20)
Potassium: 5.8 mmol/L — ABNORMAL HIGH (ref 3.3–5.1)
Potassium: 5.3 mmol/L — ABNORMAL HIGH (ref 3.3–5.1)
Sodium: 130 mmol/L — ABNORMAL LOW (ref 133–145)
Sodium: 126 mmol/L — ABNORMAL LOW (ref 133–145)

## 2018-01-15 LAB — PHOSPHORUS: Phosphorus: 5.5 mg/dL — ABNORMAL HIGH (ref 2.7–4.5)

## 2018-01-15 LAB — POCT GLUCOSE
Glucose POCT: 316 mg/dL — ABNORMAL HIGH (ref 60–99)
Glucose POCT: 163 mg/dL — ABNORMAL HIGH (ref 60–99)
Glucose POCT: 356 mg/dL — ABNORMAL HIGH (ref 60–99)
Glucose POCT: 115 mg/dL — ABNORMAL HIGH (ref 60–99)
Glucose POCT: 151 mg/dL — ABNORMAL HIGH (ref 60–99)

## 2018-01-15 MED ORDER — DEXTROSE 50 % IV SOLN *I*
25.0000 g | Freq: Once | INTRAVENOUS | Status: AC
Start: 2018-01-15 — End: 2018-01-15
  Administered 2018-01-15: 25 g via INTRAVENOUS
  Filled 2018-01-15: qty 50

## 2018-01-15 MED ORDER — INSULIN REGULAR HUMAN 100 UNIT/ML IJ SOLN *I*
6.0000 [IU] | Freq: Once | INTRAMUSCULAR | Status: AC
Start: 2018-01-15 — End: 2018-01-15
  Administered 2018-01-15: 6 [IU] via INTRAVENOUS

## 2018-01-15 MED ORDER — DEXTROSE 50 % IV SOLN *I*
25.0000 g | INTRAVENOUS | Status: AC | PRN
Start: 2018-01-15 — End: 2018-01-15

## 2018-01-15 MED ORDER — CALCIUM GLUCONATE 9.4 MEQ (2,000 MG) IN NS 110 ML *I*
9.4000 meq | Freq: Once | INTRAVENOUS | Status: AC
Start: 2018-01-15 — End: 2018-01-15
  Administered 2018-01-15: 9.4 meq via INTRAVENOUS
  Filled 2018-01-15: qty 100

## 2018-01-15 NOTE — Progress Notes (Signed)
Dialysis progress note 01/15/2018 10:16 AM    Subjective:  No acute events overnight. Patient seen and examined in room. Sitting on edge of bed. Denies SOB or chest pain. Noted to have K+ 5.8. He was treated with Dextrose/Insulin and Calcium Gluconate this morning.     Objective:  Vitals:    01/14/18 1555 01/14/18 1820 01/14/18 2235 01/15/18 0644   BP: 104/66 93/50 106/56 129/58   BP Location:  Left arm Left arm Left arm   Pulse:  64 72 77   Resp: 16 18 18 18    Temp: 36.6 C (97.9 F) 36.6 C (97.9 F) 36.5 C (97.7 F) 36.4 C (97.5 F)   TempSrc:  Temporal Temporal Temporal   SpO2: 100% 90% 98% 96%   Weight:       Height:         Intake/Output last 3 shifts:I/O last 3 completed shifts:  06/05 0700 - 06/06 0659  In: 400 (6.4 mL/kg) [I.V.:400 (0.3 mL/kg/hr)]  Out: 2400 (38.3 mL/kg) [Other:2400]  Net: -2000  Weight: 62.7 kg     IRS:WNIOE sitting in bed in NAD  Eyes: anicteric sclerae  Mouth: Moist and pink   CVS: RRR, no rub, no gallop, no murmur   Lungs: Clear bilaterally   Abd: Soft non tender non distended, + BS. Ostomy in place with appliance   Ext: Edema none   RUE AVF with + thrill and bruit   Neuro: Awake Alert       Recent Labs  Lab 01/15/18  0445 01/14/18  0447 01/13/18  0313 01/12/18  0537  01/11/18  0120 01/10/18  0058   Sodium 130* 129* 133 132*  --  135 137   Potassium 5.8* 5.9* 5.1 5.4*  < > 6.4* 6.4*   Chloride 85* 85* 89* 89*  --  92* 92*   CO2 28 26 30* 30*  --  29* 33*   UN 31* 42* 19 25*  --  21* 29*   Creatinine 6.82* 9.71* 5.84* 7.58*  --  6.66* 8.70*   Glucose 225* 298* 201* 92  --  112* 80   Calcium 9.9 9.3 9.3 9.7  --  9.5 9.3   Albumin  --   --   --   --   --  4.1 3.7   Phosphorus 5.5*  --   --  7.3*  --  6.5* 7.4*   < > = values in this interval not displayed.  No results found for: PTH, VID25   Recent Labs  Lab 01/14/18  0447 01/12/18  0537 01/11/18  0120   WBC 8.0 8.4 7.9   Hemoglobin 9.8* 9.9* 9.9*   Hematocrit 30* 30* 30*   Platelets 149* 134* 140*         Component Value Date/Time    FE  43 (L) 01/12/2018 0537    IBC 221 (L) 01/12/2018 0537    FESAT 19 (L) 01/12/2018 0537    FER 659 (H) 01/12/2018 0537        Ir Angiography Av Dialysis Shunt    Result Date: 01/13/2018  Fistulogram demonstrating interval areas of stenoses, as described above. Most of them were successfully angioplastied, however there is a focal area of focal extrinsic compression, which correlates with a palpable hard abnormality is near the distal dialysis site, which may represent scar tissue. [facility] submits this DICOM format image data and final report to the [facility], an independent secure electronic health information exchange, on a reciprocally searchable  basis (with patient authorization) for a minimum of 12 months after exam date.    US Doppler Hemodialysis Graft/fistula    Result Date: 01/12/2018  Right upper extremity arteriovenous graft with a severe stenosis 10cm beyond the anastomosis, near the origin of the stent grafts. This interpretation is based on The Society for Vascular Surgery: Clinical practice guidelines for the surgical placement and maintenance of arteriovenous hemodialysis access. 2008 Findings discussed with NP Terboss  by Dr. Alessandra Bevels at 17:31 01/12/2018. END OF IMPRESSION.           Assessment/Plan  Victor Flowers is a 72 y.o. Male ESRD on HD admitted for hyperkalemia induced weakness.  Etiology of hyperkalemia may be related to dietary indiscretion.      Plan:  1. Hemodialysis Note:   - Hyperkalemia - medically treated. Repeat K+ at 1 pm if > 5.5 will dialyze for 2 hours   - Follow chemistry   - Dose adjust medications for CrCl< 10   - Nephrovite      2. Anemia: Goal hemoglobin 10-11 g/dl   Hgb as above, slightly below goal range   No ESA at this time. If Hgb continues to fall will start ESA   Follow CBC     3. CKD-MBD:      Calcium as above, acceptable   Phosphorus sl elevated. Continue Sevelamer. Low phos diet please   Monitor serum phosphate levels     4. Volume/Blood Pressure:   BP as above,  controlled   Continue current medications     5. Access:   - AVF   - Fistual gram performed on 01/11/2018    Thank you for allowing [place] to participate in the care of this patient  Please call with questions, attending recommendations to follow.     Author: Eugenio Hoes, NP  as of: 01/15/2018  at: 10:16 AM   Pager [phone]

## 2018-01-15 NOTE — Progress Notes (Addendum)
HMD APP Cross Cover Note    Notified by RN that pt's K+ 5.8 this am. Dextrose/Insulin, Calcium Gluconate, telemetry and EKG ordered. Repeat   BMP at 1100. Pt seen. Pt states that he feels week. He denies CP, palpitations or SOB. NAD. Lungs: CTA bilaterally. Heart: RRR +murmur.     Paticia Stack, NP at 6:12 AM on 01/15/2018

## 2018-01-15 NOTE — Continuity of Care (Signed)
[place] of Quality and Surveillance for Nursing Homes and ICFs/MR  Patient Name  Victor Flowers MR Number  [id number] Account Number   Birthdate  [id number]    [facility]                Patient Review Instrument  (HC-PRI)               RUG II:  CB5     I. ADMINISTRATIVE DATA     1. Operating Certificate Number  [id number] 2. Social Security Number  [id number]   3. Official Name of Hospital Completing this Review  [facility]    4A. Patient Name  Victor Flowers 10. Sex  male   4B. [place] of Residence  [place] 11A. Date of Hospital Admission or Initial Agency Visit  01/09/2018   5. Date of PRI Completion  January 15, 2018 11B. Date of Alternate level of Care Status in Hospital (if applicable)    6. Account Number   12. Medicaid Number     7. Hospital Room Number  [id number] [id number] 13. Medicare Number     8. Name of Unit/Division/Building  [phone] 14. Primary [place]   9. Date of Birth  [id number] 34. Reason for [facility] Completion  1 - RHCF application from hospital     II. MEDICAL EVENTS     16. Decubitus Level:        Location:  No reddened skin or breakdown     17. MEDICAL CONDITIONS During the past week:  1=Yes  2=No 18. MEDICAL TREATMENTS 1=Yes  2=No   A. Comatose No A. Tracheostomy Care/Suct No         B. Dehydration No B. Suctioning/General No         C. Internal Bleeding No C. Oxygen (Daily) No              D. Stasis Ulcer No D Respiratory Care (Daily) No         E. Terminally Ill No E. Nasal Gastric Feeding No         F. Contractures No F. Parenteral Feeding No         G. Diabetes Mellitus Yes G. Wound Care Yes (Ostomy care; Generalized echymosis/bruising)         H. Urinary Tract Infection No H. Chemotherapy No         I. HIV Infection Symptomatic No I. Transfusion No         J. Accident No J. Dialysis Yes (HD (RAVF) MWF)         K. Ventilator Dependent No K. Bowel and Bladder Rehab No           L. Catheter (Indwelling or External) No               M. Physical Restraints  (Daytime) No             Adapted from [id number]    Version: 002F  Review Type: Update review  01/15/2018  2 of [place] of Quality and Surveillance for Nursing Homes and ICFs/MR  Patient Name  Victor Flowers MR Number  [id number] Account Number   [place]  [id number]    [facility]                Patient Review Instrument  (HC-PRI)  III. Activities of Daily Living     19. Eating 1-Feeds self without supervision or physical assistance.  May use adaptive equipment.    20. Mobility 3-Walks with constant one-to-one supervision and/or constant physical assistance.    21. Transfer 3-Requires one person to provide constant guidance, steadiness and/or physical assistance.  Patient may participate in transfer.    94. Toileting 3-Continent of bowel and bladder.  Requires constant supervision and/or physical assistance with major/all parts of the task, including appliances (i.e., colostomy, ilieostomy, urinary catheter). (Colostomy)      IV. BEHAVIORS     23. Verbal Disruption No known history   24. Physical Agression No known history   25. Disruptive, Infantile or Socially Inappropriate Behavior No known history   26. Hallucinations No     V. SPECIALIZED SERVICES     27A. Physical Therapy  27B. Occupational Therapy    Level: Does not receive. (Evaluation 01/14/18) Level: Does not receive. (Evaluation 01/12/18)   Actual days/week:    Actual days/week:      Actual hours/week:    Actual hours/week:        28. Number of Physician Visits - 0     VI. DIAGNOSIS     29. Primary Problem: Generalized Weakness     VII. PLAN OF CARE SUMMARY     30. Primary Diagnosis: Encounter Diagnosis   Name Primary?    Hyperkalemia Yes            PMH: See below         PSH:  has a past surgical history that includes pr temporal artery ligatn or bx (N/A, 03/28/2017) and IR angiography AV dialysis shunt (N/A, 01/13/2018).   31A. Rehabilitation Potential: Appropriate for  skilled therapy            Comment:    31B. Current therapy care plan: Treatment Interventions: Restorative PT, Assess functional mobility, Pt/Family education, AROM, Bed mobility training, Transfers training, Balance training, Gait training, Strengthening, D/C planning, Will work to minimize pain while promoting mobility whenever possible  PT Frequency: 2-4x/wk  Mobility Recommendations: 1 assist for ambulation in hallway  Discharge Recommendations: [place] (unless regains full independence)  PT Discharge Equipment Recommended: To be determined  Assessment/Recommendations Reviewed With:: Care coordinator, Nursing, Patient, Social Worker, Physician, [place] provider  Next PT Visit: work on balanceand ambulation. Stairs before d/c.   OT Frequency: 3-5x/wk  Patient Will Benefit From: ADL retraining, Functional transfer training, UE strengthening/ROM, Endurance training, Cognitive re-education, Patient/Family training, Equipment eval/education, Gross motor activities, IADL training, Community re-entry, Exercises.   OT Discharge Recommendations: [facility]  Additional Comments: Will recommend SNF rehab to increase ADL, IADL, functional mobility skills, cognition, strength and endurance, LUE movment pending x-ray recs. Patient appears agreeable, and if possible to return to sister's home to fully recover. .            Comment:    32. Medications: See below   33A. Allergies: Metformin; Bactrim [sulfamethoxazole-trimethoprim]; Heparin; and Lisinopril   33B: Treatments: eRecord   33C: Abnormal Labs: See below   33D: Precautions:  Falls           Comment:    33E: Pacemaker  None   2F: Diet: Dietary nutrition supplements adult:  Diet low potassium          34. Race/Ethnic Group White or Caucasian [1]     69. Certification    I have personally observed/interviewed this patient and completed this H/C PRI.  No - Staff/Chart           I certify that the information contained herein is a  true abstract of this patient's condition and medical record. Yes   Certified Assessor: Lucy Antigua, RN   Identification Number: [id number]             Adapted from [id number]    Version: 002F  Review Type: Update review  01/15/2018  2 of 1    PMH:    Past Medical History:   Diagnosis Date    Barrett's esophagus     BPH (benign prostatic hyperplasia)     ESRD needing dialysis     FAP (familial adenomatous polyposis)     HLD (hyperlipidemia)     HTN (hypertension)        Medications:    Current Facility-Administered Medications   Medication Dose Route Frequency    melatonin tablet 3 mg  3 mg Oral QHS PRN    insulin glargine (LANTUS) injection 4 Units  4 Units Subcutaneous Nightly    acetaminophen (TYLENOL) tablet 650 mg  650 mg Oral Q6H PRN    dextrose (GLUTOSE) 40 % oral gel 15 g  15 g Oral PRN    And    dextrose 50% (0.5 g/mL) injection 25 g  25 g Intravenous PRN    And    glucagon (GLUCAGEN) injection 1 mg  1 mg Intramuscular PRN    dorzolamide (TRUSOPT) 2 % ophthalmic solution 1 drop  1 drop Left Eye 2 times per day    timolol (TIMOPTIC) 0.25 % ophthalmic solution 1 drop  1 drop Left Eye 2 times per day    latanoprost (XALATAN) 0.005 % ophthalmic solution 1 drop  1 drop Left Eye Nightly    sevelamer (RENVELA) packet 1.6 g  1.6 g Oral TID WC    b complex-vitamin c-folic acid (NEPHRO-VITE) tablet 1 tablet  1 tablet Oral Daily    pantoprazole (PROTONIX) EC tablet 40 mg  40 mg Oral Daily    insulin lispro (HumaLOG,ADMELOG) injection 0-30 Units  0-30 Units Subcutaneous TID WC       Abnormal Labs:    Recent Results (from the past 72 hour(s))   POCT glucose    Collection Time: 01/12/18 12:46 PM   Result Value Ref Range    Glucose POCT 64 60 - 99 mg/dL   POCT glucose    Collection Time: 01/12/18  1:24 PM   Result Value Ref Range    Glucose POCT 177 (H) 60 - 99 mg/dL   POCT glucose    Collection Time: 01/12/18  6:57 PM   Result Value Ref Range    Glucose POCT 273 (H) 60 - 99 mg/dL   POCT glucose     Collection Time: 01/12/18  9:24 PM   Result Value Ref Range    Glucose POCT 112 (H) 60 - 99 mg/dL   POCT glucose    Collection Time: 01/13/18 12:11 AM   Result Value Ref Range    Glucose POCT 134 (H) 60 - 99 mg/dL   Basic metabolic panel    Collection Time: 01/13/18  3:13 AM   Result Value Ref Range    Glucose 201 (H) 60 - 99 mg/dL    Sodium 133 133 - 145 mmol/L    Potassium 5.1 3.3 - 5.1 mmol/L    Chloride 89 (L) 96 - 108 mmol/L    CO2 30 (H) 20 - 28 mmol/L    Anion Gap 14 7 -  16    UN 19 6 - 20 mg/dL    Creatinine 5.84 (H) 0.67 - 1.17 mg/dL    GFR,Caucasian 9 (!) *    GFR,Black 10 (!) *    Calcium 9.3 8.6 - 10.2 mg/dL   POCT glucose    Collection Time: 01/13/18  8:58 AM   Result Value Ref Range    Glucose POCT 151 (H) 60 - 99 mg/dL   POCT glucose    Collection Time: 01/13/18  1:52 PM   Result Value Ref Range    Glucose POCT 121 (H) 60 - 99 mg/dL   POCT glucose    Collection Time: 01/13/18  6:50 PM   Result Value Ref Range    Glucose POCT 300 (H) 60 - 99 mg/dL   POCT glucose    Collection Time: 01/13/18  9:31 PM   Result Value Ref Range    Glucose POCT 142 (H) 60 - 99 mg/dL   Basic metabolic panel    Collection Time: 01/14/18  4:47 AM   Result Value Ref Range    Glucose 298 (H) 60 - 99 mg/dL    Sodium 129 (L) 133 - 145 mmol/L    Potassium 5.9 (H) 3.3 - 5.1 mmol/L    Chloride 85 (L) 96 - 108 mmol/L    CO2 26 20 - 28 mmol/L    Anion Gap 18 (H) 7 - 16    UN 42 (H) 6 - 20 mg/dL    Creatinine 9.71 (H) 0.67 - 1.17 mg/dL    GFR,Caucasian 5 (!) *    GFR,Black 6 (!) *    Calcium 9.3 8.6 - 10.2 mg/dL   CBC    Collection Time: 01/14/18  4:47 AM   Result Value Ref Range    WBC 8.0 4.2 - 9.1 THOU/uL    RBC 3.2 (L) 4.6 - 6.1 MIL/uL    Hemoglobin 9.8 (L) 13.7 - 17.5 g/dL    Hematocrit 30 (L) 40 - 51 %    MCV 92 79 - 92 fL    MCH 31 26 - 32 pg/cell    MCHC 33 32 - 37 g/dL    RDW 15.1 (H) 11.6 - 14.4 %    Platelets 149 (L) 150 - 330 THOU/uL   POCT glucose    Collection Time: 01/14/18  9:21 AM   Result Value Ref Range    Glucose  POCT 245 (H) 60 - 99 mg/dL   Urea nitrogen pre dialysis    Collection Time: 01/14/18 11:43 AM   Result Value Ref Range    UN Pre Dialysis 47 (H) 6 - 20 mg/dL   Urea nitrogen post dialysis    Collection Time: 01/14/18  4:08 PM   Result Value Ref Range    UN post Dialysis 13 6 - 20 mg/dL   POCT glucose    Collection Time: 01/14/18  4:35 PM   Result Value Ref Range    Glucose POCT 199 (H) 60 - 99 mg/dL   POCT glucose    Collection Time: 01/14/18  7:49 PM   Result Value Ref Range    Glucose POCT 339 (H) 60 - 99 mg/dL   Basic metabolic panel    Collection Time: 01/15/18  4:45 AM   Result Value Ref Range    Glucose 225 (H) 60 - 99 mg/dL    Sodium 130 (L) 133 - 145 mmol/L    Potassium 5.8 (H) 3.3 - 5.1 mmol/L    Chloride 85 (L)  96 - 108 mmol/L    CO2 28 20 - 28 mmol/L    Anion Gap 17 (H) 7 - 16    UN 31 (H) 6 - 20 mg/dL    Creatinine 6.82 (H) 0.67 - 1.17 mg/dL    GFR,Caucasian 7 (!) *    GFR,Black 8 (!) *    Calcium 9.9 8.6 - 10.2 mg/dL   Phosphorus    Collection Time: 01/15/18  4:45 AM   Result Value Ref Range    Phosphorus 5.5 (H) 2.7 - 4.5 mg/dL   POCT glucose    Collection Time: 01/15/18  6:58 AM   Result Value Ref Range    Glucose POCT 316 (H) 60 - 99 mg/dL   POCT glucose    Collection Time: 01/15/18 10:12 AM   Result Value Ref Range    Glucose POCT 163 (H) 60 - 99 mg/dL

## 2018-01-15 NOTE — Progress Notes (Signed)
01/15/18 1147   Mobility   Head of Bed Elevated  Self regulated   Repositioned Turns self   Activity Ambulate in hall;Ambulate in room;Chair   Level of Assistance Standby assist, set-up cues, supervision of patient - no hands on   Range of Motion AROM;All extremities   Heels Elevated No   Transport Method Ambulatory   Assistive Device None   Distance Ambulated (ft) 300 ft   Ambulation Response Tolerated well

## 2018-01-15 NOTE — Progress Notes (Signed)
SW met with patient to discuss discharge plan and recommendation from PT for SNF rehab.  Patient stated that he does not want to go to rehab if he can't drive himself to and from dialysis. Writer explained that is not able to occur and patient does not want to be restricted.  Writer explained that he can pay privately for transportation to and from dialysis.  Patient stated that he needs to think about it and would like writer to speak with his daughter to see if he can stay with her at discharge.  Patient is aware that he can't return to sister's home in [place].     SW left a message for patient's daughter, Marzetta Board.  Awaiting a return call.    SW will continue to follow.    Darlyn Chamber, [place]

## 2018-01-15 NOTE — Progress Notes (Signed)
Patient is scheduled for hemodialysis 1st shift tomorrow morning. Transporters will arrive for patient between 0700-0800. Please obtain weight prior to pick up, ensure the patient is ready for transport, and administer any pre-dialysis medications. Please consult pharmacy if you are unsure of medications that should be held prior to HD.     Please weigh patient daily for accurate fluid monitoring.    Thank you.    Lemon Sternberg, RN

## 2018-01-15 NOTE — Consults (Signed)
Medical Nutrition Therapy - Brief Note - Diet Education     Met with patient this morning to review low potassium diet and potassium content of foods patient is currently eating. Provided patient with the following handouts: Potassium Content of Foods, Higher Potassium Fruits, Higher Potassium Vegetables.     Went through each of the higher potassium fruits and vegetables. The only fruit on the list that the patient has been eating regularly is peaches. When he eats peaches, he eats multiple servings at a time. He states he wants to eliminate peaches from his diet. Per review of the nutrition menu ordering software, writer confirms that patient is ordering higher potassium foods, such as peaches, chocolate chip cookies, mashed potatoes. Writer discussed these foods are high in potassium and patient states he is aware of this and is confused why he is able to order them off the menu still. Writer explained the way the menu restrictions works is that a person is able to order all types foods, but can only order them until the potassium level on the order reaches a certain point. However, writer explain that since his potassium lab is high, he should avoid high potassium foods regardless of being able to order them off the menu.     Writer assisted patient with ordering lunch and dinner meals to control potassium, sodium, and phosphorus (Patient Menu Assistant was present in the room). Also reviewed with patient that he can ask for the potassium content of meals that he orders from the Patient Menu Assistants.     Please re-consult as needed for additional diet education.     Huel Coventry, Dietetic Intern

## 2018-01-15 NOTE — Progress Notes (Signed)
Patient seen, examined, d/w attg/NSG & chart reviewed.  Please see attg note for full details.  BMP shows improved K, will try to remind sister to limit her food she bings to low K and Renal feels no need for HD today.  Diet changed to diabetic, low K and low phos.  Blood sugars continue to fluctuate.    Vitals:    01/14/18 2235 01/15/18 0644 01/15/18 1130 01/15/18 1522   BP: 106/56 129/58 107/52 147/63   BP Location: Left arm Left arm Left arm Left arm   Pulse: 72 77 66 69   Resp: 18 18 16 18    Temp: 36.5 C (97.7 F) 36.4 C (97.5 F) 35.9 C (96.6 F) 36.1 C (97 F)   TempSrc: Temporal Temporal Temporal Temporal   SpO2: 98% 96% 100% 98%   Weight:       Height:         Labs: reviewed      Recent Labs  Lab 01/14/18  0447 01/12/18  0537 01/11/18  0120   WBC 8.0 8.4 7.9   Hemoglobin 9.8* 9.9* 9.9*   Hematocrit 30* 30* 30*   Platelets 149* 134* 140*         Lab results: 01/15/18  1339 01/15/18  0445 01/14/18  0447   Sodium 126* 130* 129*   Potassium 5.3* 5.8* 5.9*   Chloride 84* 85* 85*   CO2 26 28 26    UN 39* 31* 42*   Creatinine 7.65* 6.82* 9.71*   GFR,Caucasian 6* 7* 5*   GFR,Black 7* 8* 6*   Glucose 352* 225* 298*   Calcium 9.9 9.9 9.3       Recent Labs  Lab 01/12/18  0537   INR 1.0     No results for input(s): AST, ALT, HTBIL, TB, DB in the last 72 hours.    No components found with this basename: ALKPHOS      Recent Labs  Lab 01/15/18  1418 01/15/18  1012 01/15/18  0658 01/14/18  1949 01/14/18  1635 01/14/18  0921 01/13/18  2131 01/13/18  1850   Glucose POCT 356* 163* 316* 339* 199* 245* 142* 300*     Pump Back, [place]  4:04 PM  01/15/2018    Prescriptions Prior to Admission   Medication Sig    sodium bicarbonate 650 MG tablet Take 1,300 mg by mouth 3 times daily    atorvastatin (LIPITOR) 10 MG tablet Take 10 mg by mouth daily    magnesium oxide (MAG-OX) 400 (241.3 MG) MG tablet Take 800 mg by mouth daily    sodium polystyrene (KAYEXALATE) 15 GM/60ML suspension Take 30 g by mouth three times a week   Take if  directed by MD for elevated potassium.    gabapentin (NEURONTIN) 300 MG capsule Take 300 mg by mouth daily    pantoprazole (PROTONIX) 40 MG EC tablet Take 40 mg by mouth daily   Swallow whole. Do not crush, break, or chew.    sevelamer (RENVELA) 800 MG tablet Take 1,600 mg by mouth 3 times daily (with meals)   Swallow whole. Do not crush, break, or chew.    B Complex-C-Folic Acid (RENAL-VITE) 0.8 MG TABS Take 0.8 mg by mouth at bedtime    dorzolamide-timolol (COSOPT) 22.3-6.8 MG/ML ophthalmic solution Place 1 drop into the left eye 2 times daily    HYDROcodone-acetaminophen (NORCO) 5-325 MG per tablet Take 1 tablet by mouth every 8 hours as needed for Pain    insulin detemir (LEVEMIR)  100 UNIT/ML injection vial Inject 30 Units into the skin daily   Maximum  46  Units/day     insulin lispro 100 UNIT/ML injection vial Inject 4-6 Units into the skin 3 times daily (before meals)   Maximum 18  Units/day    latanoprost (XALATAN) 0.005 % ophthalmic solution Place 1 drop into the left eye nightly    multi-vitamin (MULTIVITAMIN) per tablet Take 1 tablet by mouth daily     Scheduled Meds:   insulin glargine  4 Units Subcutaneous Nightly    dorzolamide  1 drop Left Eye 2 times per day    timolol  1 drop Left Eye 2 times per day    latanoprost  1 drop Left Eye Nightly    sevelamer  1.6 g Oral TID WC    b complex-vitamin c-folic acid  1 tablet Oral Daily    pantoprazole  40 mg Oral Daily    insulin lispro  0-30 Units Subcutaneous TID WC

## 2018-01-15 NOTE — Progress Notes (Signed)
Hospital Medicine Service Attending Progress Note    Significant Events/Subjective:   Patient reports that he continues to feel weak.  However, he is otherwise without complaint.  He reports currently being unwilling for discharge to SNF for rehab.    Objective:     Physical Exam  Temp:  [36.4 C (97.5 F)-36.6 C (97.9 F)] 36.4 C (97.5 F)  Heart Rate:  [64-77] 77  Resp:  [16-18] 18  BP: (92-129)/(47-90) 129/58  62.7 kg (138 lb 3.7 oz)    Constitutional: sitting up in bed, in no acute distress   HENT: anicteric, severe clouding of right eye  CV: RRR  Resp: normal work of breathing, on room air, lungs CTAB  GI: soft, non-tender  Skin/Lines: warm, dry, RUE AVF  Neuro: awake, alert    Recent Lab, Micro, and Imaging Studies   Personally reviewed and notable for:    K 5.8  CO2 28  Na 130  Phos 5.5    URR 72%    BGs 100s-300s    Telemetry: sinus rhythm (my read)    Current Inpatient Medications:   insulin glargine  4 Units Subcutaneous Nightly    dorzolamide  1 drop Left Eye 2 times per day    timolol  1 drop Left Eye 2 times per day    latanoprost  1 drop Left Eye Nightly    sevelamer  1.6 g Oral TID WC    b complex-vitamin c-folic acid  1 tablet Oral Daily    pantoprazole  40 mg Oral Daily    insulin lispro  0-30 Units Subcutaneous TID WC     dextrose, melatonin, acetaminophen, Nursing communication- Give 4 OZ of fruit juice for BG < 70 mg/dl **AND** dextrose **AND** dextrose **AND** glucagon **AND** POCT glucose    Assessment:     72 y.o. male with a history of ESRD on HD, HTN, DM2, prostate ca s/p TURP, FAP s/p subtotal colectomy, GERD c/b Barrett esophagus who presented with severe hyperkalemia in the setting of missed hemodialysis.    Plans:     ESRD/hyperkalemia/hyperphosphatemia:  - Etiology of hyperkalemia appears missed HD and poor dietary adherence.  - K remains elevated despite HD yesterday.  - URR 72% appears adequate.  - Fistulagram with mostly successful angioplasty although unable to open area  with extrinsic compression.  - Continue low K diet.  Requested nutrition to evaluate patient's eating habits including snacks brought by family given recurrent hyperkalemia.  - Appreciate nephrology assistance.    HTN:  - By history.  He does not appear to be on antihypertensive meds.  Patient with intermittent hypotension with HD.  Would not plan to start BP meds.    DM2:  - BGs now trending up.  Unclear if this represents increased PO intake.  - Continue Lantus 4u nightly.  On 35u Levemir at home.  - Continue Humalog TID AC with 0u baseline.  On 12u baseline at home.  - Consider increasing insulin doses if BGs consistently elevated.  - Concern for excessively strict glycemic control at home likely causing hypoglycemia.  A1c in March (via Care Everywhere) of 5.6.    Possible cognitive decline:  - B12 and TSH wnl.  - Holding gabapentin.  - PT/OT.    Glaucoma:  - Continue gtts.    GERD/Barrett esophagus:  - Continue PPI.    FAP s/p colectomy:  - Patient having pain with catheterization of Kock pouch.  CRS advising possible conversion to end ileostomy which patient is considering.  -  Appreciate CRS, WOCN assistance.    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 Discharge Date (EDD): 6/10  Discharge Criteria/Barriers to Discharge: hyperkalemia, possible placement  PT and/or OT Recommendations/Discharge to: SNF  Appointments Needed with: TBD     Case discussed with APP, RN, SW, CC, RD.    Wynona Meals, MD on 01/15/2018 at 7:50 AM

## 2018-01-15 NOTE — Progress Notes (Signed)
Hyperkalemia (mild) explained by shifting due to hyperglycemia.  No need to dialyze today.  Plan next HD tomorrow.

## 2018-01-15 NOTE — Plan of Care (Signed)
Cognitive function     Cognitive function will be maintained or return to baseline Maintaining        Mobility     Patient's functional status is maintained or improved Maintaining        Nutrition     Patient's nutritional status is maintained or improved Maintaining        Pain/Comfort     Patient's pain or discomfort is manageable Maintaining        Psychosocial     Demonstrates ability to cope with illness Maintaining        Safety     Patient will remain free of falls Maintaining     Prevent any intentional injury Maintaining            Assumed care at 1900. Pt reported a HA that was unrelieved w/ tylenol. Notified provider Paticia Stack who said that there wasn't much we could give him d/t his condition. Writer gave melatonin and pt claimed his HA went away. Writer helped pt changed his colostomy bag. Will continue to monitor.    Thom Chimes, RN

## 2018-01-16 LAB — BASIC METABOLIC PANEL
Anion Gap: 17 — ABNORMAL HIGH (ref 7–16)
CO2: 27 mmol/L (ref 20–28)
Calcium: 10 mg/dL (ref 8.6–10.2)
Chloride: 83 mmol/L — ABNORMAL LOW (ref 96–108)
Creatinine: 10.46 mg/dL — ABNORMAL HIGH (ref 0.67–1.17)
GFR,Black: 5 * — AB
GFR,Caucasian: 4 * — AB
Glucose: 184 mg/dL — ABNORMAL HIGH (ref 60–99)
Lab: 52 mg/dL — ABNORMAL HIGH (ref 6–20)
Potassium: 5.9 mmol/L — ABNORMAL HIGH (ref 3.3–5.1)
Sodium: 127 mmol/L — ABNORMAL LOW (ref 133–145)

## 2018-01-16 LAB — POCT GLUCOSE
Glucose POCT: 130 mg/dL — ABNORMAL HIGH (ref 60–99)
Glucose POCT: 203 mg/dL — ABNORMAL HIGH (ref 60–99)
Glucose POCT: 303 mg/dL — ABNORMAL HIGH (ref 60–99)
Glucose POCT: 145 mg/dL — ABNORMAL HIGH (ref 60–99)

## 2018-01-16 MED ORDER — HYDROCODONE-ACETAMINOPHEN 5-325 MG PO TABS *I*
1.0000 | ORAL_TABLET | Freq: Once | ORAL | Status: AC
Start: 2018-01-16 — End: 2018-01-16
  Administered 2018-01-16: 1 via ORAL
  Filled 2018-01-16: qty 1

## 2018-01-16 NOTE — Progress Notes (Addendum)
Patient seen & chart reviewed  Seen while on HD  States "I feel tired"  Tells me "going to live with my daughter"    Vitals:    01/16/18 1226 01/16/18 1228 01/16/18 1230 01/16/18 1231   BP: (!) 65/35 (!) 88/36 (!) 74/37 (!) 98/42   Pulse:       Resp:       Temp:       TempSrc:       SpO2: 99% 99% 100% 98%   Weight:       Height:           Exam:  General: frail, alert oriented x 3   Lungs: clear  Heart: RRR  Abd: +BS soft ostomy appliance intact no stool in bag  Ext: no edema RAVF accessed with fistula needles as seen on HD      Labs: reviewed      Recent Labs  Lab 01/14/18  0447 01/12/18  0537 01/11/18  0120   WBC 8.0 8.4 7.9   Hemoglobin 9.8* 9.9* 9.9*   Hematocrit 30* 30* 30*   Platelets 149* 134* 140*         Lab results: 01/16/18  0820 01/15/18  1339 01/15/18  0445   Sodium 127* 126* 130*   Potassium 5.9* 5.3* 5.8*   Chloride 83* 84* 85*   CO2 27 26 28    UN 52* 39* 31*   Creatinine 10.46* 7.65* 6.82*   GFR,Caucasian 4* 6* 7*   GFR,Black 5* 7* 8*   Glucose 184* 352* 225*   Calcium 10.0 9.9 9.9         Recent Labs  Lab 01/16/18  1024 01/15/18  2029 01/15/18  1842 01/15/18  1418 01/15/18  1012 01/15/18  0658 01/14/18  1949 01/14/18  1635   Glucose POCT 130* 151* 115* 356* 163* 316* 339* 199*       A/P: 72 year old male with PMH significant for 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, FAP with ostomy [name] [phone]), type II DM (now well controlled), Barrett;s esophagus, HTN, prostate cancer with TURP presented with severe hyperkalemia- K 9.5 on admit with EKG changes- due to missed HD    CKD V/hyperkalemia:  of note was using kayexelate prn as outpatient, likely element of dietary indiscretion- family is bringing him food, HD plan to be determined- regarding which days he will attend    Type II DM: per chart review on levemir at home 35 units- here ordered for 4 units lantus- SSI with no nutritional dose    HTN: currently not on meds as outpatient    Cognitive decline: TSH, B12 WNL, seen by OT recommending SNF rehab- holding neurontin  SW aware- ? Refer to [facility] clinic as outpatient    Painful cannulation of Kock pouch: CRS consulted he will think about surgery in future-   per nursing has ben moving bowels    DVT prophylaxis: SCD's as allergic to heparin    Dispo: likely SNF rehab but patient states he can live with his daughter- have asked Acute care coordinator to reach out to daughter to discuss      Cornelia Copa, NP  29:52 PM  01/16/2018    Prescriptions Prior to Admission   Medication Sig    sodium bicarbonate 650 MG tablet Take 1,300 mg by mouth 3 times daily    atorvastatin (LIPITOR) 10 MG tablet Take 10 mg by mouth daily    magnesium oxide (MAG-OX) 400 (241.3 MG) MG tablet Take 800  mg by mouth daily    sodium polystyrene (KAYEXALATE) 15 GM/60ML suspension Take 30 g by mouth three times a week   Take if directed by MD for elevated potassium.    gabapentin (NEURONTIN) 300 MG capsule Take 300 mg by mouth daily    pantoprazole (PROTONIX) 40 MG EC tablet Take 40 mg by mouth daily   Swallow whole. Do not crush, break, or chew.    sevelamer (RENVELA) 800 MG tablet Take 1,600 mg by mouth 3 times daily (with meals)   Swallow whole. Do not crush, break, or chew.    B Complex-C-Folic Acid (RENAL-VITE) 0.8 MG TABS Take 0.8 mg by mouth at bedtime    dorzolamide-timolol (COSOPT) 22.3-6.8 MG/ML ophthalmic solution Place 1 drop into the left eye 2 times daily    HYDROcodone-acetaminophen (NORCO) 5-325 MG per tablet Take 1 tablet by mouth every 8 hours as needed for Pain    insulin detemir (LEVEMIR) 100 UNIT/ML injection vial Inject 30 Units into the skin daily   Maximum  46  Units/day     insulin lispro 100 UNIT/ML injection vial Inject 4-6 Units into the skin 3 times daily (before meals)   Maximum 18  Units/day    latanoprost (XALATAN) 0.005 % ophthalmic solution Place 1 drop into the left eye nightly    multi-vitamin (MULTIVITAMIN) per tablet Take 1 tablet by mouth daily     Scheduled Meds:   insulin glargine  4 Units  Subcutaneous Nightly    dorzolamide  1 drop Left Eye 2 times per day    timolol  1 drop Left Eye 2 times per day    latanoprost  1 drop Left Eye Nightly    sevelamer  1.6 g Oral TID WC    b complex-vitamin c-folic acid  1 tablet Oral Daily    pantoprazole  40 mg Oral Daily    insulin lispro  0-30 Units Subcutaneous TID WC

## 2018-01-16 NOTE — Progress Notes (Addendum)
Writer spoke to daughter [name] [phone] to inquire if patint could live with her in the community? Writer had a nice conversation with Marzetta Board over the telephone. Marzetta Board has (2) homes, one being empty which her father can live in which she plans on. It is a (2) level home with a 2nd floor set up. It is (3) miles from her home and her mother lives (4) miles from her home. Parents are divorced. Patient's (2) sisters, ex-wife and daughter Marzetta Board all will assist patient when discharged to home and check on patient daily. Patient will have all amenities at the home.     Marzetta Board works for the [employer] and will be finished for the year 6/25. She does have small children at home but she will then be able to assist her father more at home at that point. She has his car and keys to him. Family will not let patient drive the car. Marzetta Board will be speaking with PCP about patient not driving going forward.     Stacy inquired with Probation officer about ostomy supplies. Currently patient orders online. Writer will speak to patient regarding where he orders from currently and potentially obtaining a vendor for supplies.     Please contact me with any questions or concerns. Thank you!     Lucy Antigua, RN   Acute Care Coordinator/Home Care Liaison    Phone [phone]  Pager [phone]

## 2018-01-16 NOTE — Progress Notes (Signed)
01/16/18 1000   [facility] Patient Class Review   Patient Class Review Inpatient   Patient Class effective 01/09/18  Connye Burkitt RN  Utilization Management  X [phone]

## 2018-01-16 NOTE — Progress Notes (Signed)
Report Given To   01/16/2018 Patient had a dialysis treatment today with soft and at times low b/p . Removed 200 cc of fluid Danny Lawless, LPN       Descriptive Sentence / Reason for Admission   Duration of Treatment (minutes): 093 minutes  Complications: soft and low blood pressure         Active Issues / Relevant Events            Review of medications administered  such as routine meds, antibiotics, and heparin: no medications given   Vital Signs: stable with a soft and low b/p         To Do List  Medications still needing administration: none related to dialysis   Dressing change due : remove dressing from fistula later today about 6 hours post dialysis       Anticipatory Guidance / Discharge Planning  Date/Time of Next Treatment: Monday June10

## 2018-01-16 NOTE — Progress Notes (Signed)
Hospital Medicine Service Attending Progress Note    Significant Events/Subjective:   Patient reports that he feels well today.  He reports tolerating dialysis well.    Objective:     Physical Exam  Temp:  [35.9 C (96.6 F)-37 C (98.6 F)] 37 C (98.6 F)  Heart Rate:  [66-84] 68  Resp:  [16-18] 16  BP: (107-147)/(52-66) 125/66  64.5 kg (142 lb 3.2 oz)    Constitutional: sitting up in bed, in no acute distress   HENT: anicteric  CV: RRR  Resp: normal work of breathing, on room air, lungs CTAB  Skin/Lines: warm, dry, RUE AVF  Neuro: awake, alert    Recent Lab, Micro, and Imaging Studies   Personally reviewed and notable for:    K 5.9  Na 127    BGs 100s-300s    Current Inpatient Medications:   insulin glargine  4 Units Subcutaneous Nightly    dorzolamide  1 drop Left Eye 2 times per day    timolol  1 drop Left Eye 2 times per day    latanoprost  1 drop Left Eye Nightly    sevelamer  1.6 g Oral TID WC    b complex-vitamin c-folic acid  1 tablet Oral Daily    pantoprazole  40 mg Oral Daily    insulin lispro  0-30 Units Subcutaneous TID WC     melatonin, acetaminophen, Nursing communication- Give 4 OZ of fruit juice for BG < 70 mg/dl **AND** dextrose **AND** dextrose **AND** glucagon **AND** POCT glucose    Assessment:     72 y.o. male with a history of ESRD on HD, HTN, DM2, prostate ca s/p TURP, FAP s/p subtotal colectomy, GERD c/b Barrett esophagus who presented with severe hyperkalemia in the setting of missed hemodialysis.    Plans:     ESRD/hyperkalemia/hyperphosphatemia:  - Etiology of hyperkalemia appears missed HD and poor dietary adherence.  - K remains elevated.  - URR 72% appears adequate.  - Fistulagram with mostly successful angioplasty although unable to open area with extrinsic compression.  - Continue low K diet.  Limit K intake from patient's snacks which he eats in addition to food from kitchen.  - May need to consider Veltassa if K remains high despite adequate dialysis and dietary  education/counseling.  - Appreciate nephrology assistance.    HTN:  - By history.  He does not appear to be on antihypertensive meds.  Patient with intermittent hypotension with HD.  Would not plan to start BP meds.    DM2:  - BGs extremely variable.  - Continue Lantus 4u nightly.  On 35u Levemir at home.  - Continue Humalog TID AC with 0u baseline.  On 12u baseline at home.  - Consider increasing insulin doses if BGs consistently elevated.  - Concern for excessively strict glycemic control at home likely causing hypoglycemia.  A1c in Marchof 5.6.    Possible cognitive decline:  - B12 and TSH wnl.  - Holding gabapentin.  - PT/OT.    Glaucoma:  - Continue gtts.    GERD/Barrett esophagus:  - Continue PPI.    FAP s/p colectomy:  - Patient having pain with catheterization of Kock pouch.  CRS advising possible conversion to end ileostomy which patient is considering.  - Appreciate CRS, WOCN assistance.    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 Discharge Date (EDD): 6/10  Discharge Criteria/Barriers to Discharge: hyperkalemia, possible placement,  local HD slot  PT and/or OT Recommendations/Discharge to: SNF  Appointments Needed with: TBD     Case discussed with APP, RN, SW, CC.    Wynona Meals, MD on 01/16/2018 at 7:50 AM

## 2018-01-16 NOTE — Progress Notes (Signed)
Nephrology  01/16/2018 8:35 AM    Victor Flowers was seen on dialysis.    Interval Events:    No adverse events overnight.      Objective:  Blood pressure 131/68, pulse 68, temperature 36 C (96.8 F), resp. rate 16, height 1.829 m (6'), weight 64.5 kg (142 lb 3.2 oz), SpO2 98 %.    RKY:HCWCB, alert, oriented ; lucid; NAD  Eyes:  Conj pale; gaze conjugate; no scleral  iceterus  ENT:  OP clear with MMM  Chest:  Clear with normal effort  CVS: reg; no rub; no edema; An AVF is presents and has needles in place; AVF is a bit high pitched, but continuous.  Ab: soft, nt, ostomy in place  Skin:  No rashes or ulcers on visible skin  Neuro:  Alert, oriented, but mild lethargy, memory appears to be intact, affect is flat        Recent Labs  Lab 01/15/18  1339 01/15/18  0445 01/14/18  0447  01/12/18  0537  01/11/18  0120 01/10/18  0058   Sodium 126* 130* 129*  < > 132*  --  135 137   Potassium 5.3* 5.8* 5.9*  < > 5.4*  < > 6.4* 6.4*   Chloride 84* 85* 85*  < > 89*  --  92* 92*   CO2 26 28 26   < > 30*  --  29* 33*   UN 39* 31* 42*  < > 25*  --  21* 29*   Creatinine 7.65* 6.82* 9.71*  < > 7.58*  --  6.66* 8.70*   Glucose 352* 225* 298*  < > 92  --  112* 80   Calcium 9.9 9.9 9.3  < > 9.7  --  9.5 9.3   Albumin  --   --   --   --   --   --  4.1 3.7   Phosphorus  --  5.5*  --   --  7.3*  --  6.5* 7.4*   < > = values in this interval not displayed.     Recent Labs  Lab 01/14/18  0447 01/12/18  0537 01/11/18  0120   WBC 8.0 8.4 7.9   Hemoglobin 9.8* 9.9* 9.9*   Hematocrit 30* 30* 30*   Platelets 149* 134* 140*          Ir Angiography Av Dialysis Shunt    Result Date: 01/13/2018  Fistulogram demonstrating interval areas of stenoses, as described above. Most of them were successfully angioplastied, however there is a focal area of focal extrinsic compression, which correlates with a palpable hard abnormality is near the distal dialysis site, which may represent scar tissue. [facility] submits this DICOM format image data and final report to  the [facility], an independent secure electronic health information exchange, on a reciprocally searchable basis (with patient authorization) for a minimum of 12 months after exam date.           Assessment/Plan  Victor Flowers is a 72 y.o. Male  ESRD on HD admitted for hyperkalemia induced weakness.  Etiology of hyperkalemia may be related to dietary indiscretion.  Seems improved.   Labs pending today.    Plan:  1. Hemodialysis Note:   Presently dialyzing:     F180 Na 138 K 1 Ca 2.5 HCO3 35 Qb: 400 ml/min Qd: 2 X Qb UF 2 today      EDW = 63 kg.   Low potassium, low sodium diet please  Dose all medications for creatinine clearance < 10 ml/min    Limit beverage intake to 1.5 L per day   Give Nephrovite or similar renal vitamin daily    2.  Anemia:   hgb acceptable.  Hold on additional ESA.    3.  Hyperparathyroidism:    Low phos diet.  Continue Renvela.  Phos level pending.    4.  Hypertension:   Blood pressure is controlled.  Continue current medication regimen.     5.  Access: AVF working well today.      6.  Hyperkalemia:   Await labs.    Author: Fernande Bras, MD  as of: 01/16/2018  at: 8:35 AM

## 2018-01-16 NOTE — Plan of Care (Addendum)
Cognitive function     Cognitive function will be maintained or return to baseline Maintaining        Mobility     Patient's functional status is maintained or improved Maintaining        Nutrition     Patient's nutritional status is maintained or improved Maintaining        Pain/Comfort     Patient's pain or discomfort is manageable Maintaining        Psychosocial     Demonstrates ability to cope with illness Maintaining        Safety     Patient will remain free of falls Maintaining     Prevent any intentional injury Maintaining            Assumed care of pt at 1900. Pt had no complaints overnight. Writer gave pt melatonin with a positive effect. Has been running NSR on tele. Will continue to monitor.    @0500  pt calls writer in room demanding I take off his tele monitor so he can change his ostomy pouch. When writer explained he needed to keep it on pt proceeded to yell saying he doesn't have to do anything I tell him and he can do whatever he wants. Pt then ripped off monitor. Notified Berline Chough, NP to let her know tele is off. She asked to encourage to put back on later.     Thom Chimes, RN

## 2018-01-17 DIAGNOSIS — N2889 Other specified disorders of kidney and ureter: Secondary | ICD-10-CM

## 2018-01-17 DIAGNOSIS — I151 Hypertension secondary to other renal disorders: Secondary | ICD-10-CM

## 2018-01-17 LAB — BASIC METABOLIC PANEL
Anion Gap: 14 (ref 7–16)
CO2: 31 mmol/L — ABNORMAL HIGH (ref 20–28)
Calcium: 9.7 mg/dL (ref 8.6–10.2)
Chloride: 86 mmol/L — ABNORMAL LOW (ref 96–108)
Creatinine: 7.49 mg/dL — ABNORMAL HIGH (ref 0.67–1.17)
GFR,Black: 8 * — AB
GFR,Caucasian: 7 * — AB
Glucose: 210 mg/dL — ABNORMAL HIGH (ref 60–99)
Lab: 29 mg/dL — ABNORMAL HIGH (ref 6–20)
Potassium: 5 mmol/L (ref 3.3–5.1)
Sodium: 131 mmol/L — ABNORMAL LOW (ref 133–145)

## 2018-01-17 LAB — POCT GLUCOSE
Glucose POCT: 321 mg/dL — ABNORMAL HIGH (ref 60–99)
Glucose POCT: 191 mg/dL — ABNORMAL HIGH (ref 60–99)
Glucose POCT: 217 mg/dL — ABNORMAL HIGH (ref 60–99)
Glucose POCT: 182 mg/dL — ABNORMAL HIGH (ref 60–99)

## 2018-01-17 MED ORDER — PATIROMER SORBITEX CALCIUM 8.4 G PO PACK *I*
8.4000 g | PACK | Freq: Every day | ORAL | Status: DC
Start: 2018-01-17 — End: 2018-01-20
  Administered 2018-01-17 – 2018-01-20 (×4): 8.4 g via ORAL
  Filled 2018-01-17 (×4): qty 1

## 2018-01-17 MED ORDER — INSULIN LISPRO (HUMAN) 100 UNIT/ML IJ/SC SOLN *WRAPPED*
0.0000 [IU] | Freq: Three times a day (TID) | SUBCUTANEOUS | Status: DC
Start: 2018-01-17 — End: 2018-01-18
  Administered 2018-01-17: 6 [IU] via SUBCUTANEOUS
  Administered 2018-01-18: 5 [IU] via SUBCUTANEOUS

## 2018-01-17 NOTE — Plan of Care (Signed)
Cognitive function     Cognitive function will be maintained or return to baseline Maintaining        Mobility     Patient's functional status is maintained or improved Maintaining        Nutrition     Patient's nutritional status is maintained or improved Maintaining        Pain/Comfort     Patient's pain or discomfort is manageable Maintaining        Psychosocial     Demonstrates ability to cope with illness Maintaining        Safety     Patient will remain free of falls Maintaining     Prevent any intentional injury Maintaining          Assumed care at 1900. Pt has been restless throughout night even after melatonin given. Writer tried to ask pt if he would turn off his light and TV because it could help him sleep, but pt refuses. Pt has also had complaints of HA. Tylenol given. Will continue to monitor.    Thom Chimes, RN

## 2018-01-17 NOTE — Progress Notes (Signed)
Patient is scheduled for hemodialysis 1st shift Monday morning. Transporters will arrive for patient between 0700-0800. Please obtain weight prior to pick up. Also, please facilitate early patient breakfast (if applicable) and administer any pre-dialysis medications. Please consult pharmacy if you are unsure of medications that should be held prior to HD.      Please weigh patient daily for accurate fluid monitoring.     Thank you.  Jaime Grizzell, RN

## 2018-01-17 NOTE — Consults (Signed)
Nephrology Attending Progress Note: 01/17/2018 1:33 PM  _________________________________________________________________________________  Overall Impression and Recommendations:    72 y.o. male currently recovering from hyperkalemia due to multiple missed dialysis treatments in the setting of ESRD on HD (MWF), hypertension, diabetes, prostate cancer, subtotal colectomy, GERD     ESRD - Next dialysis Monday   Hyperkalemia - K is 5 today.  - suggest patiromir 8.4 gm daily for control of hyperkalemia  - continue low potassium diet   Anemia - On epogen    Please see below for supporting details  _________________________________________________________________________________    Details and Data  Following for hyperkalemia and provision of hemodialysis.  Patient seen and examined, records reviewed including medications, labs, imaging, and progress notes.  I have also discussed relevant management issues with the Nephrology Consult Team and the team caring for the patient.    Events:   Dialyzed yesterday, K=5.0 today    Exam:  On exam, the patient is alert and interactive.  Vitals: BP 105/52 (BP Location: Left arm)    Pulse 61    Temp 36.6 C (97.9 F) (Temporal)    Resp 17    Ht 1.829 m (6')    Wt 64.5 kg (142 lb 3.2 oz)    SpO2 99%    BMI 19.29 kg/m  .      Relevant Renal Data:    I/O last 3 completed shifts:  06/07 0700 - 06/08 0659  In: 1350 (20.9 mL/kg) [I.V.:1350 (0.9 mL/kg/hr)]  Out: 1634 (25.3 mL/kg) [Other:1634]  Net: -284  Weight: 64.5 kg        Recent Labs  Lab 01/17/18  0353 01/16/18  0820 01/15/18  1339  01/12/18  0537  01/11/18  0120   Sodium 131* 127* 126*  < > 132*  --  135   Potassium 5.0 5.9* 5.3*  < > 5.4*  < > 6.4*   Chloride 86* 83* 84*  < > 89*  --  92*   CO2 31* 27 26  < > 30*  --  29*   UN 29* 52* 39*  < > 25*  --  21*   Creatinine 7.49* 10.46* 7.65*  < > 7.58*  --  6.66*   Albumin  --   --   --   --   --   --  4.1   LD  --   --   --   --  145  --   --    < > = values in this interval not  displayed.    No components found with this basename:  GFRC,  GFRB,  GLU, PROT, BILITOT, ALKPHOS     Recent Labs  Lab 01/17/18  0353 01/16/18  0820 01/15/18  1339 01/15/18  0445  01/12/18  0537 01/11/18  0120   Calcium 9.7 10.0 9.9 9.9  < > 9.7 9.5   Albumin  --   --   --   --   --   --  4.1   Phosphorus  --   --   --  5.5*  --  7.3* 6.5*   Magnesium  --   --   --   --   --  2.1 2.0   < > = values in this interval not displayed.     Recent Labs  Lab 01/14/18  0447 01/12/18  0537 01/11/18  0120   WBC 8.0 8.4 7.9   Hemoglobin 9.8* 9.9* 9.9*   Hematocrit 30* 30* 30*  Platelets 149* 134* 140*   INR  --  1.0  --    Iron  --  43*  --        No components found with this basename: NEUTOPHILPCT, MONOPCT,  EOSPCT, APTT         Plan:  See above for succinct recommendations    Author: Ollen Bowl, MD,PHD  as of: 01/17/2018  at: 1:33 PM

## 2018-01-17 NOTE — Progress Notes (Signed)
Hospital Medicine Service Attending Progress Note    Significant Events/Subjective:   Pt. Seen & examined. Hoping he can be d/ced Monday w/ his DTR.     Objective:     Physical Exam  Temp:  [36 Victor (96.8 F)-37 Victor (98.6 F)] 37 Victor (98.6 F)  Heart Rate:  [67-72] 72  Resp:  [16-18] 18  BP: (64-126)/(35-59) 126/58       Constitutional: awake, alert  CV: RRR  Resp: CTA B/L  Skin/Lines: warm, dry, RUE AVF  Neuro: awake, alert  abd-+ ostomy    Recent Lab, Micro, and Imaging Studies   K-5  BGs 145-321    Current Inpatient Medications:   insulin glargine  4 Units Subcutaneous Nightly    dorzolamide  1 drop Left Eye 2 times per day    timolol  1 drop Left Eye 2 times per day    latanoprost  1 drop Left Eye Nightly    sevelamer  1.6 g Oral TID WC    b complex-vitamin Victor-folic acid  1 tablet Oral Daily    pantoprazole  40 mg Oral Daily    insulin lispro  0-30 Units Subcutaneous TID WC     melatonin, acetaminophen, Nursing communication- Give 4 OZ of fruit juice for BG < 70 mg/dl **AND** dextrose **AND** dextrose **AND** glucagon **AND** POCT glucose    Assessment:     72 y.o. male with a history of ESRD on HD, HTN, DM2, prostate ca s/p TURP, FAP s/p subtotal colectomy, GERD Victor/b Barrett esophagus who presented with severe hyperkalemia in the setting of missed hemodialysis.    Plans:     ESRD/hyperkalemia/hyperphosphatemia:  Still w/ K of 5 today-d/w Renal, ? Need for veltassa   -- Fistulagram with mostly successful angioplasty although unable to open area with extrinsic compression.  - Continue low K diet.   - Appreciate nephrology assistance.    DM2:  - BGs extremely variable.  - Continue Lantus 4u nightly.  On 35u Levemir at home.  - would start 2 units baseline w/ meals      Possible cognitive decline:  - B12 and TSH wnl.  - Holding gabapentin.      Glaucoma:  - Continue gtts.    GERD/Barrett esophagus:  - Continue PPI.    FAP s/p colectomy:  - Patient having pain with catheterization of Kock pouch.  CRS advising  possible conversion to end ileostomy which patient is considering.  - Appreciate CRS, WOCN assistance.    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 Discharge Date (EDD): 6/10  Discharge Criteria/Barriers to Discharge: hyperkalemia, possible placement, local HD slot  PT and/or OT Recommendations/Discharge to: SNF  Appointments Needed with: TBD     Seen & D/W Victor Burgess Amor, MD on 01/17/2018 at 11:05 AM

## 2018-01-17 NOTE — Progress Notes (Signed)
Pt seen and examined. See attending note -Dr Kyra Manges for full details. Chart, labs, and medications reviewed.  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, FAP with ostomy [name] [phone]), type II DM (now well controlled), Barrett;s esophagus, HTN, prostate cancer with TURP presented with severe hyperkalemia- K 9.5 on admit with EKG changes- due to missed HD    ESRD/hyperkalemia:  of note was using kayexelate prn as outpatient, likely element of dietary indiscretion- family is bringing him food, HD plan to be determined- regarding which days he will attend  ?does he have oupt slot   K 5 today started veltessa     Type II DM: per chart review on levemir at home 35 units- here ordered for 4 units lantus- SSI with 2 units  nutritional dose    Cognitive decline: TSH, B12 WNL, seen by OT recommending SNF rehab- holding neurontin SW aware- ? Refer to [facility] as outpatient    Painful cannulation of Kock pouch: CRS consulted he will think about surgery in future-       DVT prophylaxis: SCD's as allergic to heparin    Dispo: likely SNF rehab but patient states he can live with his daughter- have asked Acute care coordinator to reach out to daughter to discuss    AM labs: [place], NP

## 2018-01-18 LAB — BASIC METABOLIC PANEL
Anion Gap: 16 (ref 7–16)
CO2: 31 mmol/L — ABNORMAL HIGH (ref 20–28)
Calcium: 10.1 mg/dL (ref 8.6–10.2)
Chloride: 82 mmol/L — ABNORMAL LOW (ref 96–108)
Creatinine: 10.16 mg/dL — ABNORMAL HIGH (ref 0.67–1.17)
GFR,Black: 5 * — AB
GFR,Caucasian: 5 * — AB
Glucose: 240 mg/dL — ABNORMAL HIGH (ref 60–99)
Lab: 51 mg/dL — ABNORMAL HIGH (ref 6–20)
Potassium: 4.9 mmol/L (ref 3.3–5.1)
Sodium: 129 mmol/L — ABNORMAL LOW (ref 133–145)

## 2018-01-18 LAB — POCT GLUCOSE
Glucose POCT: 200 mg/dL — ABNORMAL HIGH (ref 60–99)
Glucose POCT: 218 mg/dL — ABNORMAL HIGH (ref 60–99)
Glucose POCT: 149 mg/dL — ABNORMAL HIGH (ref 60–99)
Glucose POCT: 188 mg/dL — ABNORMAL HIGH (ref 60–99)

## 2018-01-18 MED ORDER — ACETAMINOPHEN 325 MG PO TABS *I*
325.0000 mg | ORAL_TABLET | Freq: Once | ORAL | Status: AC
Start: 2018-01-18 — End: 2018-01-18
  Administered 2018-01-18: 325 mg via ORAL
  Filled 2018-01-18: qty 1

## 2018-01-18 MED ORDER — INSULIN LISPRO (HUMAN) 100 UNIT/ML IJ/SC SOLN *WRAPPED*
0.0000 [IU] | Freq: Three times a day (TID) | SUBCUTANEOUS | Status: DC
Start: 2018-01-18 — End: 2018-01-18

## 2018-01-18 MED ORDER — INSULIN LISPRO (HUMAN) 100 UNIT/ML IJ/SC SOLN *WRAPPED*
0.0000 [IU] | Freq: Three times a day (TID) | SUBCUTANEOUS | Status: DC
Start: 2018-01-18 — End: 2018-01-26
  Administered 2018-01-18: 5 [IU] via SUBCUTANEOUS
  Administered 2018-01-18: 8 [IU] via SUBCUTANEOUS
  Administered 2018-01-19 (×3): 4 [IU] via SUBCUTANEOUS
  Administered 2018-01-20 (×2): 5 [IU] via SUBCUTANEOUS
  Administered 2018-01-20: 7 [IU] via SUBCUTANEOUS
  Administered 2018-01-21 (×2): 4 [IU] via SUBCUTANEOUS
  Administered 2018-01-21: 6 [IU] via SUBCUTANEOUS
  Administered 2018-01-22 (×2): 5 [IU] via SUBCUTANEOUS
  Administered 2018-01-22 – 2018-01-23 (×3): 4 [IU] via SUBCUTANEOUS
  Administered 2018-01-24: 8 [IU] via SUBCUTANEOUS
  Administered 2018-01-24: 4 [IU] via SUBCUTANEOUS
  Administered 2018-01-25 (×2): 5 [IU] via SUBCUTANEOUS

## 2018-01-18 MED ORDER — CALCIUM CARBONATE ANTACID 500 MG PO CHEW *I*
500.0000 mg | CHEWABLE_TABLET | Freq: Once | ORAL | Status: AC
Start: 2018-01-18 — End: 2018-01-18
  Administered 2018-01-18: 500 mg via ORAL
  Filled 2018-01-18: qty 1

## 2018-01-18 NOTE — Consults (Signed)
Nephrology Attending Progress Note: 01/18/2018 12:06 PM  _________________________________________________________________________________  Overall Impression and Recommendations:    72 y.o. male currently recovering from hyperkalemia due to multiple missed dialysis treatments in the setting of ESRD on HD (MWF), hypertension, diabetes, prostate cancer, subtotal colectomy, GERD     ESRD - Next dialysis Monday   Hyperkalemia - K is 5 today.  - continue patiromir 8.4 gm daily for control of hyperkalemia  - continue low potassium diet   Anemia - On epogen    Please see below for supporting details  _________________________________________________________________________________    Details and Data  Following for hyperkalemia and provision of hemodialysis.  Patient seen and examined, records reviewed including medications, labs, imaging, and progress notes.  I have also discussed relevant management issues with the Nephrology Consult Team and the team caring for the patient.    Events:   Dialyzed Friday, K=5.0 today, wants to go home    Exam:  On exam, the patient is alert and interactive.  Vitals: BP 115/56 (BP Location: Left arm)    Pulse 78    Temp 37.4 C (99.3 F) (Temporal)    Resp 18    Ht 1.829 m (6')    Wt 64.5 kg (142 lb 3.2 oz)    SpO2 95%    BMI 19.29 kg/m  .  Lungs clear, extremities with trace-1+ edema.    Relevant Renal Data:    No intake/output data recorded.       Recent Labs  Lab 01/18/18  0107 01/17/18  0353 01/16/18  0820  01/12/18  0537   Sodium 129* 131* 127*  < > 132*   Potassium 4.9 5.0 5.9*  < > 5.4*   Chloride 82* 86* 83*  < > 89*   CO2 31* 31* 27  < > 30*   UN 51* 29* 52*  < > 25*   Creatinine 10.16* 7.49* 10.46*  < > 7.58*   LD  --   --   --   --  145   < > = values in this interval not displayed.    No components found with this basename:  GFRC,  GFRB,  GLU, PROT, BILITOT, ALKPHOS     Recent Labs  Lab 01/18/18  0107 01/17/18  0353 01/16/18  0820  01/15/18  0445  01/12/18  0537   Calcium  10.1 9.7 10.0  < > 9.9  < > 9.7   Phosphorus  --   --   --   --  5.5*  --  7.3*   Magnesium  --   --   --   --   --   --  2.1   < > = values in this interval not displayed.     Recent Labs  Lab 01/14/18  0447 01/12/18  0537   WBC 8.0 8.4   Hemoglobin 9.8* 9.9*   Hematocrit 30* 30*   Platelets 149* 134*   INR  --  1.0   Iron  --  43*       No components found with this basename: NEUTOPHILPCT, MONOPCT,  EOSPCT, APTT         Plan:  See above for succinct recommendations    Author: Ollen Bowl, MD,PHD  as of: 01/18/2018  at: 12:06 PM

## 2018-01-18 NOTE — Progress Notes (Signed)
PT Treatment Note    Victor Flowers demonstrates adequate mobility to return to his prior living arrangement. No further acute PT needed. Discharge Recommendations: (P) No further PT needed after discharge after discharge from the hospital.     PT Discharge Equipment Recommended: (P) None     01/18/18 1500   PT Tracking   PT TRACKING PT Discontinue   Visit Number   Visit Number [facility]) / Treatment Day ([place]) 0   Visit Details [facility])   Visit Type [facility]) Follow Up   Reason for visit [facility]) General   Precautions/Observations   Precautions used Yes   LDA Observation None   Fall Precautions General falls precautions   Other Patient off to the 4th floor lounge area independently  earlier today   Pain Assessment   *Is the patient currently in pain? Denies   Additional comments Reports shoulder pain has much improved   Cognition   Cognition Tested   Arousal/Alertness Appropriate responses to stimuli   Orientation A&Ox3   Following Commands Follows simple commands without difficulty   LUE (degrees)   Shoulder Flex  0-170 150   Shoulder ABduction 0-140 100   Shoulder Int Rotation  0-70 waist   Shoulder Ext Rotation  0-90 75   LUE Strength   Shoulder Flex 3/5   Shoulder ABduction 3/5     Shoulder Int Rotation 3/5      Shoulder Ext Rotation 3/5     Elbow Flex 4/5     Elbow Ext 4/5     Grip Strength 4/5   Bed Mobility   Bed mobility Tested   Supine to Sit Independent   Sit to Supine Independent   Additional comments Moving from flat bed   Transfers   Transfers Tested   Sit to Stand Independent   Stand to sit Independent   Additional comments Standing and moving within room independently   Mobility   Mobility Tested   Gait Pattern Decreased cadence   Ambulation Assist Independent   Ambulation Distance (Feet) 240'   Ambulation Assistive Device None  (trial with cane but no change in balance)   Additional comments Patient moving quite well. Reports having a "bum right leg" from preovious hip surgery, but felt cane did not offer him any  better support and preferred no device   Balance   Balance Tested   Sitting - Static Independent    Sitting - Dynamic Independent   Standing - Static Independent   Standing - Dynamic Independent   PT AM-PAC Mobility   Turning over in bed? 4   Sitting down on and standing up from a chair with arms? 4   Moving from lying on back to sitting on the side of the bed? 4   Moving to and from a bed to a chair? 4   Need to walk in hospital room? 4   Climbing 3 - 5 steps with a railing? 3  (clinical judgement)   Total Raw Score 23   Assessment   Brief Assessment Patient demonstrates adequate mobility skills to return home   Additional Comments Patient is ambulating independently around unit and down to patient/family lounge area on 4th floor   Plan/Recommendation   Treatment Interventions Assess functional mobility;Pt/Family education;D/C planning   PT Frequency none further   Mobility Recommendations ambulate ad lib   Discharge Recommendations No further PT needed after discharge   PT Discharge Equipment Recommended None   Assessment/Recommendations Reviewed With: Nursing;Patient;Physician;Midlevel provider   Time Calculation  PT Timed Codes 23  (Gait x 14 min/ FT x 9 min)   PT Untimed Codes 0   PT Unbilled Time 0   PT Total Treatment 23   Plan and Onset date   Plan of Care Date 01/18/18   Onset Date 01/09/18   Treatment Start Date 01/14/18   Morey Hummingbird A. Ronney Lion, PT  Pager [phone]

## 2018-01-18 NOTE — Progress Notes (Signed)
Pt seen and examined. See attending note -Dr Kyra Manges for full details. Chart, labs, and medications reviewed.  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, FAP with ostomy Victor Flowers [phone]), type II DM (now well controlled), Barrett;s esophagus, HTN, prostate cancer with TURP presented with severe hyperkalemia- K 9.5 on admit with EKG changes- due to missed HD    ESRD/hyperkalemia:  of note was using kayexelate prn as outpatient, likely element of dietary indiscretion- family is bringing him food, HD plan to be determined- regarding which days he will attend  ?does he have oupt slot SW to address in am   veltessa -K improved     Type II DM: per chart review on levemir at home 35 units- here ordered for 4 units lantus- SSI with 2 units  nutritional dose    Cognitive decline: TSH, B12 WNL, seen by OT recommending SNF rehab- will have OT/PT resee pt as he is ambulating independently   holding neurontin SW aware- ? Refer to [facility] clinic as outpatient    Painful cannulation of Kock pouch: CRS consulted he will think about surgery in future-       DVT prophylaxis: SCD's as allergic to heparin    Dispo: likely SNF rehab but patient states he can live with his daughter- have asked Acute care coordinator to reach out to daughter to discuss    AM labs: [place], NP

## 2018-01-18 NOTE — Progress Notes (Signed)
Hospital Medicine Service Attending Progress Note    Significant Events/Subjective:   Pt. Seen & examined.    Wants to go home.      Objective:     Physical Exam  Temp:  [37 C (98.6 F)-37.4 C (99.3 F)] 37.4 C (99.3 F)  Heart Rate:  [65-78] 78  Resp:  [18] 18  BP: (115-138)/(56-62) 115/56       Constitutional: awake, alert  CV: RRR  Resp: CTA B/L  Skin/Lines: warm, dry, RUE AVF  Neuro: awake, alert  abd-+ ostomy    Recent Lab, Micro, and Imaging Studies   K-5  BGs 145-321    Current Inpatient Medications:   insulin lispro  0-30 Units Subcutaneous TID WC    patiromer  8.4 g Oral Daily    insulin glargine  4 Units Subcutaneous Nightly    dorzolamide  1 drop Left Eye 2 times per day    timolol  1 drop Left Eye 2 times per day    latanoprost  1 drop Left Eye Nightly    sevelamer  1.6 g Oral TID WC    b complex-vitamin c-folic acid  1 tablet Oral Daily    pantoprazole  40 mg Oral Daily     melatonin, acetaminophen, Nursing communication- Give 4 OZ of fruit juice for BG < 70 mg/dl **AND** dextrose **AND** dextrose **AND** glucagon **AND** POCT glucose    Assessment:     72 y.o. male with a history of ESRD on HD, HTN, DM2, prostate ca s/p TURP, FAP s/p subtotal colectomy, GERD c/b Barrett esophagus who presented with severe hyperkalemia in the setting of missed hemodialysis.    Plans:     ESRD/hyperkalemia/hyperphosphatemia  veltassa started per renal, K-4.9 today  HD tomorrow  -- Fistulagram with mostly successful angioplasty although unable to open area with extrinsic compression.  - Continue low K diet.   - Appreciate nephrology assistance.    DM2:  - BGs extremely variable.  - Continue Lantus 4u nightly.  On 35u Levemir at home.  -would increase baseline to 4 units w/ meals      Possible cognitive decline:  - B12 and TSH wnl.  - Holding gabapentin.      Glaucoma:  - Continue gtts.    GERD/Barrett esophagus:  - Continue PPI.    FAP s/p colectomy:  - Patient having pain with catheterization of Kock pouch.   CRS advising possible conversion to end ileostomy which patient is considering.  - Appreciate CRS, WOCN assistance.    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 Discharge Date (EDD): 6/10  Discharge Criteria/Barriers to Discharge: needs local HD slot if not going back to [place]  PT and/or OT Recommendations/Discharge to: cleared from a PT perspective.   Would consider asking OT to re-eval. Tomorrow as he has improved since admission  Appointments Needed with: TBD      D/W C Burgess Amor, MD on 01/18/2018 at 12:17 PM

## 2018-01-18 NOTE — Plan of Care (Signed)
Impaired Ambulation    • LTG - IMPROVE AMBULATION Adequate for discharge        Impaired Bed Mobility    • LTG - IMPROVE BED MOBILITY Adequate for discharge        Impaired Stair Navigation    • LTG - IMPROVE STAIR NAVIGATION Adequate for discharge        Impaired Transfers    • LTG - IMPROVE TRANSFERS Adequate for discharge

## 2018-01-19 ENCOUNTER — Other Ambulatory Visit: Payer: Self-pay | Admitting: Cardiology

## 2018-01-19 LAB — CBC AND DIFFERENTIAL
Baso # K/uL: 0 10*3/uL (ref 0.0–0.1)
Basophil %: 0.3 %
Eos # K/uL: 0.5 10*3/uL (ref 0.0–0.5)
Eosinophil %: 7.4 %
Hematocrit: 28 % — ABNORMAL LOW (ref 40–51)
Hemoglobin: 9.3 g/dL — ABNORMAL LOW (ref 13.7–17.5)
IMM Granulocytes #: 0.1 10*3/uL
IMM Granulocytes: 1.6 %
Lymph # K/uL: 1.1 10*3/uL — ABNORMAL LOW (ref 1.3–3.6)
Lymphocyte %: 16.9 %
MCH: 31 pg/cell (ref 26–32)
MCHC: 34 g/dL (ref 32–37)
MCV: 91 fL (ref 79–92)
Mono # K/uL: 0.8 10*3/uL (ref 0.3–0.8)
Monocyte %: 12.8 %
Neut # K/uL: 3.9 10*3/uL (ref 1.8–5.4)
Nucl RBC # K/uL: 0 10*3/uL (ref 0.0–0.0)
Nucl RBC %: 0 /100 WBC (ref 0.0–0.2)
Platelets: 125 10*3/uL — ABNORMAL LOW (ref 150–330)
RBC: 3.1 MIL/uL — ABNORMAL LOW (ref 4.6–6.1)
RDW: 15 % — ABNORMAL HIGH (ref 11.6–14.4)
Seg Neut %: 61 %
WBC: 6.4 10*3/uL (ref 4.2–9.1)

## 2018-01-19 LAB — BASIC METABOLIC PANEL
Anion Gap: 19 — ABNORMAL HIGH (ref 7–16)
CO2: 24 mmol/L (ref 20–28)
Calcium: 10.1 mg/dL (ref 8.6–10.2)
Chloride: 86 mmol/L — ABNORMAL LOW (ref 96–108)
Creatinine: 13.18 mg/dL — ABNORMAL HIGH (ref 0.67–1.17)
GFR,Black: 4 * — AB
GFR,Caucasian: 3 * — AB
Glucose: 163 mg/dL — ABNORMAL HIGH (ref 60–99)
Lab: 69 mg/dL — ABNORMAL HIGH (ref 6–20)
Potassium: 5.7 mmol/L — ABNORMAL HIGH (ref 3.3–5.1)
Sodium: 129 mmol/L — ABNORMAL LOW (ref 133–145)

## 2018-01-19 LAB — POCT GLUCOSE
Glucose POCT: 184 mg/dL — ABNORMAL HIGH (ref 60–99)
Glucose POCT: 126 mg/dL — ABNORMAL HIGH (ref 60–99)
Glucose POCT: 97 mg/dL (ref 60–99)
Glucose POCT: 103 mg/dL — ABNORMAL HIGH (ref 60–99)
Glucose POCT: 68 mg/dL (ref 60–99)
Glucose POCT: 72 mg/dL (ref 60–99)

## 2018-01-19 LAB — CRP: CRP: 3 mg/L (ref 0–10)

## 2018-01-19 LAB — EKG 12-LEAD
P: 62 deg
PR: 216 ms
QRS: -78 deg
QRSD: 138 ms
QT: 420 ms
QTc: 463 ms
Rate: 73 {beats}/min
T: 224 deg

## 2018-01-19 LAB — SEDIMENTATION RATE, AUTOMATED: Sedimentation Rate: 45 mm/hr — ABNORMAL HIGH (ref 0–20)

## 2018-01-19 MED ORDER — AMITRIPTYLINE HCL 25 MG PO TABS *I*
25.0000 mg | ORAL_TABLET | Freq: Once | ORAL | Status: AC
Start: 2018-01-19 — End: 2018-01-19
  Administered 2018-01-19: 25 mg via ORAL
  Filled 2018-01-19: qty 1

## 2018-01-19 MED ORDER — KETOROLAC TROMETHAMINE 30 MG/ML IJ SOLN *I*
15.0000 mg | Freq: Once | INTRAMUSCULAR | Status: AC
Start: 2018-01-19 — End: 2018-01-19
  Administered 2018-01-19: 15 mg via INTRAVENOUS
  Filled 2018-01-19: qty 1

## 2018-01-19 MED ORDER — INSULIN REGULAR HUMAN 100 UNIT/ML IJ SOLN *I*
0.1000 [IU]/kg | Freq: Once | INTRAMUSCULAR | Status: AC
Start: 2018-01-19 — End: 2018-01-19
  Administered 2018-01-19: 7.8 [IU] via INTRAVENOUS

## 2018-01-19 MED ORDER — ACETAMINOPHEN 325 MG PO TABS *I*
325.0000 mg | ORAL_TABLET | Freq: Once | ORAL | Status: AC
Start: 2018-01-19 — End: 2018-01-19
  Administered 2018-01-19: 325 mg via ORAL
  Filled 2018-01-19: qty 1

## 2018-01-19 MED ORDER — TRAZODONE HCL 50 MG PO TABS *I*
25.0000 mg | ORAL_TABLET | Freq: Every evening | ORAL | Status: DC
Start: 2018-01-19 — End: 2018-01-31
  Administered 2018-01-19 – 2018-01-29 (×11): 25 mg via ORAL
  Filled 2018-01-19 (×13): qty 1

## 2018-01-19 MED ORDER — DEXTROSE 50 % IV SOLN *I*
25.0000 g | Freq: Once | INTRAVENOUS | Status: AC
Start: 2018-01-19 — End: 2018-01-19
  Administered 2018-01-19: 25 g via INTRAVENOUS
  Filled 2018-01-19: qty 50

## 2018-01-19 MED ORDER — DEXTROSE 50 % IV SOLN *I*
25.0000 g | INTRAVENOUS | Status: AC | PRN
Start: 2018-01-19 — End: 2018-01-19

## 2018-01-19 MED ORDER — OXYCODONE HCL 5 MG PO TABS *I*
2.5000 mg | ORAL_TABLET | Freq: Once | ORAL | Status: AC
Start: 2018-01-19 — End: 2018-01-19
  Administered 2018-01-19: 2.5 mg via ORAL
  Filled 2018-01-19: qty 1

## 2018-01-19 NOTE — Progress Notes (Addendum)
HMD APP Cross Cover Note:    Paged by nursing, unrelieved L temporal headache     Subjective:  Patient reports L sided headache located in the back of his eye and L temporal region  Reports vague blurred vision, which has happened intermittently in the past   Denies N/V     Objective  Vitals:    01/18/18 1432   BP: 132/78   Pulse: 76   Resp: 18   Temp: 36.6 C (97.9 F)   Weight:    Height:      Gen: No signs of acute distress but appears uncomfortable  Ocular exam: left eye pupil responsive to light, round and reactive, EOMI, R eye with cataract?     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 Now with L temporal headache     Headache tension? Low concern for temporal arteritis, low concern for shingles   Hx of severe L eye glaucoma, continue current eye gtts  Give Acetaminophen '325mg'$  PO x1  No tenderness noted on exam over eye, L temporal region, forehead and scalp  No erythema noted or blisters  No suspicious findings consistent with shingles  Low concern for temporal arteritis, though if pain does not improve, consider Opthalmology consult in AM   Discussed plan with bedside RN    Harland Dingwall, NP at 12:11 AM on 01/19/2018    ADDENDUM:  - Patient still with L temporal headache  - Ordered Oxycdone 2.'5mg'$  PO x1  - Check ESR, and CRP with AM labs   - Plan of care discussed with bedside RN     Harland Dingwall, NP at 1:39 AM on 01/19/2018

## 2018-01-19 NOTE — Progress Notes (Signed)
Occupational Therapy    Attempted to see pt today for OT tx however pt very lethargic and unable to maintain eyes open. .  Pt states "It's snoozie time."  RN reports pt had dialysis today.  Pt agreeable to OT coming back tomorrow morning if time allows.  Would be better for patient to participate in functional tasks during times when he is not lethargic.      Tannya Gonet V. Landry Mellow, OTR/L  Please contact the OT pager [phone]) for any questions/concerns and/or update requests.

## 2018-01-19 NOTE — Progress Notes (Addendum)
Patient seen & chart reviewed  Pt evaluated at HD today   He complaining of left sided headache consistent with stress headaches he has had in past he admits to not sleeping at night and feeling anxious with everything going on hospitalization move etc   Headache is 10/10 radiates throughout left side of head no N/V photophobia       Reviewed notes form last night re headache   Vitals:    01/19/18 1130 01/19/18 1200 01/19/18 1218 01/19/18 1230   BP: 91/51 108/58 112/50 121/51   BP Location:       Pulse: 77 68 71 61   Resp: 17 12 17 12    Temp:   36.5 C (97.7 F)    TempSrc:       SpO2: 100% 100% 99% 100%   Weight:    63.5 kg (139 lb 15.9 oz)   Height:           Exam:  General: alert and oriented x3 very flat affect poor eye contact   Head NT palpation left pupil equal right pinpoint   Lungs: clear respirations easy   Heart: reg s1s2   Abd: soft NT   Ostomy bag leaking light brown stool   Ext: RUE fistula being used for HD   no edema   MAE well       Labs: reviewed      Recent Labs  Lab 01/19/18  0157 01/14/18  0447   WBC 6.4 8.0   Hemoglobin 9.3* 9.8*   Hematocrit 28* 30*   Platelets 125* 149*         Lab results: 01/19/18  0157 01/18/18  0107 01/17/18  0353   Sodium 129* 129* 131*   Potassium 5.7* 4.9 5.0   Chloride 86* 82* 86*   CO2 24 31* 31*   UN 69* 51* 29*   Creatinine 13.18* 10.16* 7.49*   GFR,Caucasian 3* 5* 7*   GFR,Black 4* 5* 8*   Glucose 163* 240* 210*   Calcium 10.1 10.1 9.7       No results for input(s): ALT, AST in the last 168 hours.    No components found with this basename: ALKPHOS, BILITOT, BILIDIR, PROT, LABALBU  No results for input(s): INR in the last 168 hours.      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, FAP with ostomy Victor Flowers [phone]), type II DM (now well  controlled), Barrett;s esophagus, HTN, prostate cancer with TURP presented with severe hyperkalemia- K 9.5 on admit with EKG changes- due to missed HD    Headache: hx exam and labs not consistent with TA   Hx exam more consistent with stress headache and ?depression/insominia  playing a role   toradol 15 mg x1 now  trazodone 25 at HS (QTC 439 on recent EKG)   Will avoid opiates ad they are not indicated in the management of headaches     ESRD/hyperkalemia: of note was using kayexelate prn as outpatient, likely element of dietary indiscretion- family is bringing him food, K elevated today but should be improved after HD  He does not have outpt HD slot social worker sending packet   veltessa  Type II DM: per chart review on levemir at home 35 units- here ordered for 4units lantus- SSI with 2 units  nutritional dose    Cognitive decline: TSH, B12 WNL, seen by OT recommending SNF rehab- will have OT/PT resee pt as he is ambulating independently   holding neurontin SW aware- ? Refer to [facility] adults clinic as outpatient    Painful cannulation of Kock pouch: CRS consulted he will think about surgery in future-       DVT prophylaxis: SCD's as allergic to heparin    Dispo: cleared PT awaiting OT to see   Needs HD slot and ?home plan         Victor Baltazar, NP  1:30 PM  01/19/2018      Scheduled Meds:   insulin lispro  0-30 Units Subcutaneous TID WC    patiromer  8.4 g Oral Daily    insulin glargine  4 Units Subcutaneous Nightly    dorzolamide  1 drop Left Eye 2 times per day    timolol  1 drop Left Eye 2 times per day    latanoprost  1 drop Left Eye Nightly    sevelamer  1.6 g Oral TID WC    b complex-vitamin c-folic acid  1 tablet Oral Daily    pantoprazole  40 mg Oral Daily

## 2018-01-19 NOTE — Continuity of Care (Signed)
Acute Care Coordinator / Discharge Planning:    Per provider notes 01/19/2018:  72 y.o. male with a history of ESRD on HD, HTN, DM2, prostate ca s/p TURP, FAP s/p subtotal colectomy, GERD c/b Barrett esophagus who presented with severe hyperkalemia in the setting of missed hemodialysis.    ACC discharge planning:   Discussed discharge during morning rounds with interdisciplinary team, patient is not medically ready for discharge. Patient has cleared PT and is working with OT. If he clears OT he will be a home plan. SW working on a slot for HD. He was a [employer] prior to admission. Writer to speak with daughter for more discharge planning. Anticipate medical readiness Tuesday.     I will continue to follow patient progress for discharge planning. Please contact me with any questions or concerns. Thank you!     Lucy Antigua, RN   Acute Care Coordinator/Home Care Liaison    Phone [phone]  Pager [phone]

## 2018-01-19 NOTE — Progress Notes (Signed)
Report Given To  01/19/18  Pt tolerated 4 hours HD, as ordered. Pt had c/o 10/10 headache over his left eye during tx. Tylenol given, as ordered without reported relief from pt. Medicine covering provider notified. Hypotensive during tx, requiring additional NS boluses to maintain BP >90. Net removal: 1L.       Descriptive Sentence / Reason for Admission   Duration of tx: 094 minutes  Complications: Hypotension, c/o headache        Active Issues / Relevant Events        Review of medications administered  such as routine meds, antibiotics, and heparin: Tylenol   Vital Signs: Hypotensive, see flowsheet for details        To Do List  Medications still needing administration: None r/t HD     Dressing change due : Please remove drsg to AVG 6 hours post tx    Daily weights      Anticipatory Guidance / Discharge Planning  Next HD: Wednesday June 12, per MWF schedule    Edilia Bo, RN

## 2018-01-19 NOTE — Progress Notes (Signed)
Hospital Medicine Service Attending Progress Note    Significant Events/Subjective:   Patient had left sided headache overnight.    Patient denies dyspnea.  He reports tolerating Veltassa.  He is trying to limit K intake.    Objective:     Physical Exam  Temp:  [36.3 C (97.3 F)-36.6 C (97.9 F)] 36.3 C (97.3 F)  Heart Rate:  [59-76] 59  Resp:  [18-20] 20  BP: (132-158)/(69-78) 158/69  64.5 kg (142 lb 1.6 oz)    Constitutional: sitting up in bed, in no acute distress   HENT: anicteric  CV: RRR  Resp: normal work of breathing, on room air, lungs CTAB  Skin/Lines: warm, dry, RUE AVF  Neuro: awake, alert    Recent Lab, Micro, and Imaging Studies   Personally reviewed and notable for:    K 5.7  Na 129    BGs 100s-200s    Hgb 9.3    Telemetry: sinus rhythm with occasional PACs, HR 50s-60s (my read)    Current Inpatient Medications:   insulin lispro  0-30 Units Subcutaneous TID WC    patiromer  8.4 g Oral Daily    insulin glargine  4 Units Subcutaneous Nightly    dorzolamide  1 drop Left Eye 2 times per day    timolol  1 drop Left Eye 2 times per day    latanoprost  1 drop Left Eye Nightly    sevelamer  1.6 g Oral TID WC    b complex-vitamin c-folic acid  1 tablet Oral Daily    pantoprazole  40 mg Oral Daily     melatonin, acetaminophen, Nursing communication- Give 4 OZ of fruit juice for BG < 70 mg/dl **AND** dextrose **AND** dextrose **AND** glucagon **AND** POCT glucose    Assessment:     72 y.o. male with a history of ESRD on HD, HTN, DM2, prostate ca s/p TURP, FAP s/p subtotal colectomy, GERD c/b Barrett esophagus who presented with severe hyperkalemia in the setting of missed hemodialysis.    Plans:     ESRD/hyperkalemia:  - Etiology of hyperkalemia appears missed HD and poor dietary adherence.  - K elevated today despite addition of Veltassa.  - URR 72% last week appears adequate.  - Fistulagram with mostly successful angioplasty although unable to open area with extrinsic compression.  - Continue  low K diet.  Limit K intake from patient's snacks which he eats in addition to food from kitchen.  - [place] nephrology assistance.    HTN:  - By history.  He does not appear to be on antihypertensive meds.  Patient with intermittent hypotension with HD.  Would not plan to start BP meds.    DM2:  - BGs extremely variable.  - Continue Lantus 4u nightly.  On 35u Levemir at home.  - Continue Humalog TID AC with 4u baseline.  On 12u baseline at home.  - Concern for excessively strict glycemic control at home likely causing hypoglycemia.  A1c in March of 5.6.    Possible cognitive decline:  - B12 and TSH wnl.  - Holding gabapentin.  - PT/OT.    Glaucoma:  - Continue gtts.    GERD/Barrett esophagus:  - Continue PPI.    FAP s/p colectomy:  - Patient having pain with catheterization of Kock pouch.  CRS advising possible conversion to end ileostomy which patient is considering.  - Appreciate CRS, WOCN assistance.    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 Discharge Date (EDD): 6/11  Discharge Criteria/Barriers to Discharge: hyperkalemia, possible placement, local HD slot  PT and/or OT Recommendations/Discharge to: home with 24hr supervision/assist  Appointments Needed with: TBD     Case discussed with APP, RN, SW, CC.    Wynona Meals, MD on 01/19/2018 at 7:41 AM

## 2018-01-19 NOTE — Discharge Instructions (Signed)
Your diagnosis was: Severe hyperkalemia in the setting of missed dialysis treatments.    What was done: You had urgent dialysis treatment to help correct the severely elevated potassium      What do I need to do: Follow your diet.  Try to adhere to your dialysis schedule and not miss further treatments.        Recommended diet: low potassium, low sodium, renal diet    Recommended activity: activity as tolerated    Wound Care: {wound care:18263}    Diabetes management:check Fingerstick blood glucoses before meals and bedtime- keep a record for your Dr. And take to your appointment  [place] Insulin Regimen:27712}    Oxygen:    Medication changes to highlight:     Labs:       If you experience any of these symptoms 24 hours or more after discharge:Uncontrolled pain, Chest pain, Shortness of breath, Fever of 101 F. or greater, Chills, Poor appetite, Poor urinary output, Vomiting, Nausea, Diarrhea, Blood in stool or Weakness  Call Dr. Marland Kitchen      If you experience any of these symptoms within the first 24 hours after discharge call Dr.  Marland Kitchen at [phone]      Smoking    Smoking can increase your chances of developing chronic health problems or worsen conditions you already have.  If you smoke you should quit. Smoking cessation information has been given to you for your review to help you quit.  Medications to help you quit are available.  Ask your doctor if you would like to receive these medications.       Medications  Your doctor has prescribed medications to improve or manage your condition.  You should take them as prescribed by your doctor.  Ask your doctor for any questions regarding these medications.          Diet  A healthy diet is important to help you stay well.  Some health conditions require you to be on a special diet.  You should monitor your fluid intake and limit the amount of sodium including table salt.  This will help you avoid fluid retention which can cause shortness of breath or swelling of the feet  and ankles.  Reading food labels is helpful when you are on a special diet.  Follow instructions from your doctor for any other special dietary requirement.    Exercise/Activity  Activity and exercise are important to your well being.  While you are in the hospital your activity may be restricted.  As your condition improves your activity level will be increased.  Most patients will be able to gradually resume activity as before.  You should follow your doctors activity recommendations      Daily Weight  You will need to weigh yourself every day at the same time on the same scale, preferably after you empty your bladder.  If you have an increase of 3 pounds in 2 days or 5 pounds in 1 week you should contact your physician.  A daily written log of your weights is a good way to keep track.  You should follow your doctors recommendations on monitoring your weight.     What to do if your condition changes?  Once you leave the hospital you should contact your doctors office to make a follow up appointment.  If at any time you have any questions or concerns or your condition gets worse, contact your physician.  If you can not reach your physician or  you develop life threatening symptoms such as trouble breathing or chest pain you should go to the closest Emergency Department.

## 2018-01-19 NOTE — Progress Notes (Signed)
DIALYSIS NOTE:    Seen and examined during HD.      Vitals:    01/19/18 1230   BP: 121/51   Pulse: 61   Resp: 12   Temp:    Weight: 63.5 kg (139 lb 15.9 oz)   Height:      General: Alert, pleasant  Chest: CTABL, normal excursion  CVS: S1 + S2 , could not hear any murmur  Legs: no edema, no rash  Neuro: Moving all limbs, alert    Labs: reviewed    @Hemoglobin @  .  A/P:    ESRD patients (MWF) presented with hyperkalemia after missing HD and now with headache.    Volume: EDW is 63 kg. UF as tolerted  BP: in 120's  Anemia: Hgb is 9.3 g/dL, On EPO  MBD: Please obtain PO4 with the next set of labs. Continue Renvela  Access: Left ram AVF s/p angioplasty during this admission   Continue Patiromer for now. Will need to obtain URR on Wednesday,  Low K diet  Avoid daily blood draws if possible  Renal dose meds      Victor Flowers, MBBS, FASN  Department of Medicine  Divisions of Nephrology and Palliative Care

## 2018-01-19 NOTE — Plan of Care (Addendum)
Cognitive function     Cognitive function will be maintained or return to baseline Maintaining        Mobility     Patient's functional status is maintained or improved Maintaining        Nutrition     Patient's nutritional status is maintained or improved Maintaining        Pain/Comfort     Patient's pain or discomfort is manageable Maintaining        Psychosocial     Demonstrates ability to cope with illness Maintaining        Safety     Patient will remain free of falls Maintaining     Prevent any intentional injury Maintaining        Writer assume care at 1900, patient c/o pain behind the left eye& left temple. PRN tylenol given without positive effect. Provider notified and saw patient. 1x 325mg  tylenol given without positive effect. Continues to cry out loud from pain. 1x 2.5mg  oxy with positive effect. Patient continues to yell out of pain when awake. 1x amitriptyline given with minimal effect. K+ came back 5.7, put patient on tele, EKG done, Dextrose & insulin given. BG 184. Patient insist on having his tele remove when he is in the bathroom. Yells "god damn it" & "why me" multiple times overnight. Will continue to monitor.

## 2018-01-19 NOTE — Progress Notes (Signed)
SW spoke with [facility] outpatient HD to see if the patient had outpatient HD spot. SW was informed that they patient was listed to receive 10 HD sessions. She explained if the patient needs to continued HD he will have to go through [facility] to receive additional HD slots.     Jeralene Peters, [place]

## 2018-01-20 LAB — BASIC METABOLIC PANEL
Anion Gap: 17 — ABNORMAL HIGH (ref 7–16)
CO2: 29 mmol/L — ABNORMAL HIGH (ref 20–28)
Calcium: 9.7 mg/dL (ref 8.6–10.2)
Chloride: 85 mmol/L — ABNORMAL LOW (ref 96–108)
Creatinine: 8.63 mg/dL — ABNORMAL HIGH (ref 0.67–1.17)
GFR,Black: 6 * — AB
GFR,Caucasian: 6 * — AB
Glucose: 111 mg/dL — ABNORMAL HIGH (ref 60–99)
Lab: 29 mg/dL — ABNORMAL HIGH (ref 6–20)
Potassium: 5 mmol/L (ref 3.3–5.1)
Sodium: 131 mmol/L — ABNORMAL LOW (ref 133–145)

## 2018-01-20 LAB — POCT GLUCOSE
Glucose POCT: 159 mg/dL — ABNORMAL HIGH (ref 60–99)
Glucose POCT: 191 mg/dL — ABNORMAL HIGH (ref 60–99)
Glucose POCT: 144 mg/dL — ABNORMAL HIGH (ref 60–99)

## 2018-01-20 MED ORDER — PATIROMER SORBITEX CALCIUM 8.4 G PO PACK *I*
8.4000 g | PACK | Freq: Once | ORAL | Status: AC
Start: 2018-01-20 — End: 2018-01-20
  Administered 2018-01-20: 8.4 g via ORAL
  Filled 2018-01-20: qty 1

## 2018-01-20 MED ORDER — PATIROMER SORBITEX CALCIUM 16.8 G PO PACK *I*
16.8000 g | PACK | Freq: Every day | ORAL | Status: DC
Start: 2018-01-21 — End: 2018-01-28
  Administered 2018-01-21 – 2018-01-27 (×5): 16.8 g via ORAL
  Filled 2018-01-20 (×8): qty 1

## 2018-01-20 NOTE — Continuity of Care (Addendum)
Acute Care Coordinator / Discharge Planning:    Per provider notes 01/19/2018:  72 y.o. male with a history of ESRD on HD, HTN, DM2, prostate ca s/p TURP, FAP s/p subtotal colectomy, GERD c/b Barrett esophagus who presented with severe hyperkalemia in the setting of missed hemodialysis.    ACC discharge planning:   Discussed discharge during morning rounds with interdisciplinary team, patient is not medically ready for discharge. Patient has cleared PT and is working with OT. If he clears OT he will be a home plan. SW working on a slot for HD. He was a [facility] prior to admission. Writer to speak with daughter for more discharge planning. Anticipate medical readiness Tuesday.     Addendum 6/11 - Writer spoke to both patient and daughter [name] [phone] regarding discharge planning. Writer had little interaction with patient. He was agreeable to home care services with [facility]. Patient will receive CHN for medication management. Writer attempted to determine who the vendor for patient's ostomy was, no information was obtained. Patient lay in bed with eyes closed stating he could not remember the information.     Writer did speak at length with daughter Marzetta Board. She also wanted home care services for her father. Marzetta Board stated that her father does not take care of himself. He does not wash up or shower, he has not showered in months. 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 Marzetta Board does have the keys and her fathers car. She states today that it is her fathers car and if he wants it back she can't say no. She is trying to have her father stay here in [place]. Patient does not have a PCP here, agreeable to new provider at [facility]. Family will work together to provide transportation to medical appts and HD, after 6/25 Marzetta Board will provide most of the transportation d/t she will be off work for the summer.     Address/phone/PCP: all verified - patient will stay at new address [place], [place] [address]  Home Set Up/Lives With: Patient will stay alone in home which is (3) minutes down the road from his daughter Marzetta Board and (2) minutes down the road from [place]  CG/Supports/ADL's: Chales Abrahams and two sisters will check in on patient at least daily and clean up after him and assist with meals and grocery store        Medication Management: Patient manages; CHN to assist with medication management          Pharmacy: [facility] [address]  Transportation: Marzetta Board will provide transportation at discharge         Keys/Clothing:  Marzetta Board will bring clothes and keys for home          Home Care: [facility]  PT/OT recommendations:  Home PT and OT                    Plan/Dispo: patient to be d/c when medically stable.     I will continue to follow patient progress for discharge planning. Please call with any questions or concerns. Thank you!    Lucy Antigua, RN   Acute Care Coordinator/Home Care Liaison    Phone [phone]  Pager [phone]

## 2018-01-20 NOTE — Progress Notes (Addendum)
Occupational Therapy      Pt seen for 2nd visit today.  Found ambulating w/ cane around unit w/ his nurse.  Appeared more alert however pt still w/ complaint of being in a "stupor".  States this is not his usual state of alertness.  Set pt up w/ "fake" medications and pill box as pt stated he uses pill box at baseline.  Attempted to assess pt's ability to independently set up however pt again w/ report of feeling sleepy and was therefore deferred.  Challenged pt to demonstrate ability to operate his cell phone and challenged pt to simulate number he would dial in the case of an emergency, however pt called his sister, [name] instead, which was concerning therapist.  When home alone, would expect pt to dial "911".  Therapist able to then speak with his sister at lengths in regards to his baseline.  Sister reports pt has been unable to safely manage medications (left his medications home in [place] when traveling to [place]). Reports pt does not bathe.  States pt eats out frequently and does not control his DM well.  Also reports pt has much difficulty w/ his managing his finances.  States his checks bounces frequently.  Reports these things causes a lot of stress for the family.  Per chart, daughter now has pt's keys and pt will not be able to drive.    Unable to come to a definitive recommendation at this time d/t pt's level of alertness each visit today . However, based on this therapist time with patient today and conversation w/ pt's sister regarding his baseline, pt may likely benefit from 24 hour sup/assist upon return home vs. ALF w/ assistance for all iadls (med management, meals, finances, transportation to HD/md appts) and intermittent check-ins to assist w/ initiating adl tasks.   **May benefit from additional OT treatments to make a determination when pt is more alert.  From speaking to sister, it appears that pt has had difficulty at baseline w/ safely caring for self.  Although today pt can physically  complete basic level self care tasks without vcs for safety e.g. Toileting, grooming, clothing, pt requires much initiative to complete each task.     Timed Calculations:  Timed Codes: 13 adls, 22 min therapeutic act   Untimed Codes: 0  Unbilled Time: 0  Total Time:  35 mins        Angeliyah Kirkey V. Landry Mellow, OTR/L  Please contact the OT pager [phone]) for any questions/concerns and/or update requests.

## 2018-01-20 NOTE — Progress Notes (Signed)
Home Health Assessment    Completed by: Clarice Pole, RN  For [name] RN  Phone:   [phone]    Referred by: Margot Ables RN ACC on [phone]      Source of Information: Medical record      Home Health indicators present: Pt will need follow up at home      Barriers to discharge to be addressed:  Will assess      Plan: Writer reviewed chart and [facility] from [place] will follow up with pt    On Pts facesheet there isn't a PCP listed that is local. Pt will need an MD willing to sign home care orders. [place] [place] Rn Aware.  Writer aware pt will be staying locally: [facility].  [place] will continue to follow hospital course and arrange appropriate services to assist with a safe discharge plan.  Please call with any questions. Thank You.  Clarice Pole RN for [name] RN  After Hours [phone] option [phone]  Weekends and [place] [phone]  [place]

## 2018-01-20 NOTE — Progress Notes (Signed)
Occupational Therapy Progress Note     01/20/18 0900   Visit Details [facility])   Visit Type [facility]) Follow Up   Reason for visit [facility]) Cog/Safety   OT Tracking   OT Tracking OT Assigned   OT Last Visit   Visit (#) of Five 2   Precautions   Precautions used Yes   LDA Observation IV lines  (colostomy)   Pain Assessment   *Is the patient currently in pain? No   Additional Comments No report of pain during session.    ADL Assessment   Grooming Independent   Toileting Independent   Additional Comments Pt lying in bed this am.  Pt presenting w/ lethargy.  Strong encouragement provided to participate as pt lacks initiative to begin any task. Encouraged pt to complete sponge bath EOB however pt refused. Pt eventually sat EOB after ~ 15 mins.  Pt with need for colostomy bag to be emptied.  Therapist encouraged pt to to empty.  Pt w/ regular socks on and d/t slick flooring, therapist advised pt to don non-skid socks however pt waved his hand and proceeded to ambulate into the bathroom.  Demonstrated ability to empty colostomy bag w/ independence.  Stood at sink to wash hands w/ independence.  Encouraged pt to brush teeth and shave however pt refused stating "Why don't you let me do my own thing".     Bed Mobility   Supine to Sit Independent   Sit to Supine Independent   Functional Transfers   Sit to Stand Independent   Stand to Sit Independent   Balance   Sitting - Static Independent    Sitting - Dynamic Independent   Standing - Static Independent   Standing - Dynamic Independent   Cognition   Cognition Deficit noted   Level of Alertness Lethargic   Orientation Oriented to person; Disoriented to place- reports being in NC currently.  Oriented to yr   Attention  Impaired focal-decreased attention to SLUMS assessment likely lethargy.  Will need to re-administer during a time pt is more alert.   Memory Decreased short term memory-decreased recall from short story of SLUMS assessment.   Following Commands One step simple   Initiation  Verbal cues to initiate tasks;Physical cues to initiate tasks-required multiple vcs/tactile cues to sit EOB.  Pt lacks initiative and requires strong encouragement to participate in adl tasks.  Pt reports feeling "sleepy".   Additional Comments SLUMS administered this am and scored 9/30.  Low score could possibly be d/t report of sleepiness.  Will attempt assessment again hopefully later today if pt is more alert.  During test pt stated "What do you think I am dumb or something."   Assessment   Assessment Patient making progress, continue with plan of care   Recommendations   Additional Comments Will attempt to see pt later today with hopes that pt will be be more alert.  Plan to provide recommendation at that time.    Multidisciplinary Communication   Multidisciplinary Communication RN, pt, CC, MD, PCT   Timed Calculations:  Timed Codes:  20 min therapeutic act, 12 min adl  Untimed Codes: 0  Unbilled Time: 15 mins  Total Time:  47 mins    Jorel Gravlin V. Landry Mellow, OTR/L  Please contact the OT pager [phone]) for any questions/concerns and/or update requests.

## 2018-01-20 NOTE — Progress Notes (Signed)
Dialysis progress note 01/20/2018 12:46 PM    Subjective:  No acute events overnight. Patient seen and examined in room. Laying bed. Voices no complaints.   Objective:  Vitals:    01/19/18 1230 01/19/18 1441 01/19/18 2252 01/20/18 0657   BP: 121/51 131/55 123/56 103/50   BP Location:  Left arm Left arm Left arm   Pulse: 61 60 73 81   Resp: 12 16 16 16    Temp:  36.5 C (97.7 F) 37.3 C (99.1 F) 37.8 C (100 F)   TempSrc:  Temporal Temporal Temporal   SpO2: 100% 99% 98% 95%   Weight: 63.5 kg (139 lb 15.9 oz)      Height:         Intake/Output last 3 shifts:I/O last 3 completed shifts:  06/10 0700 - 06/11 0659  In: 1350 (21.3 mL/kg) [P.O.:550; I.V.:800 (0.5 mL/kg/hr)]  Out: 2455 (38.7 mL/kg) [Other:1780; Stool:675]  Net: -1105  Weight: 63.5 kg     GUY:QIHKV and in NAD   Eyes: anicteric sclerae  Mouth: Moist and pink   CVS: RRR, no rub, no gallop  Lungs: Clear anterolaterally   Abd: Soft non tender non distended, + BS   Ext: Edema none   RUE AVF + thrill and bruit noted   Neuro: Awake Alert       Recent Labs  Lab 01/20/18  0559 01/19/18  0157 01/18/18  0107  01/15/18  0445   Sodium 131* 129* 129*  < > 130*   Potassium 5.0 5.7* 4.9  < > 5.8*   Chloride 85* 86* 82*  < > 85*   CO2 29* 24 31*  < > 28   UN 29* 69* 51*  < > 31*   Creatinine 8.63* 13.18* 10.16*  < > 6.82*   Glucose 111* 163* 240*  < > 225*   Calcium 9.7 10.1 10.1  < > 9.9   Phosphorus  --   --   --   --  5.5*   < > = values in this interval not displayed.  No results found for: PTH, VID25   Recent Labs  Lab 01/19/18  0157 01/14/18  0447   WBC 6.4 8.0   Hemoglobin 9.3* 9.8*   Hematocrit 28* 30*   Platelets 125* 149*         Component Value Date/Time    FE 43 (L) 01/12/2018 0537    IBC 221 (L) 01/12/2018 0537    FESAT 19 (L) 01/12/2018 0537    FER 659 (H) 01/12/2018 0537        No results found.         Assessment/Plan  Victor Flowers is a 72 y.o. Male ESRD on HD admitted for hyperkalemia induced weakness.  Etiology of hyperkalemia may be related to dietary  indiscretion. Improved. Awaiting outpatient HD slot and possible SNF.     Plan:  1. Hemodialysis Note:   - HD tomorrow   - Follow chemistry   - Dose adjust medications for CrCl< 10   - Nephrovite   - Patiromer - Check URR on Wednesday - Ordered      2. Anemia: Goal hemoglobin 10-11 g/dl   Hgb as above, slightly below goal range   Start EPo 5000 units with HD  Follow CBC     3. CKD-MBD:      Calcium as above, acceptable   Continue Sevelamer. Low phos diet please   Monitor serum phosphate levels     4. Volume/Blood Pressure:  BP as above, controlled   Continue current medications     5. Access:   - AVF   - Fistual gram performed on 01/11/2018- HD tomorrow     Thank you for allowing [place] to participate in the care of this patient  Please call with questions, attending recommendations to follow.     Author: Eugenio Hoes, NP  as of: 01/20/2018  at: 12:46 PM   Pager [phone]

## 2018-01-20 NOTE — Plan of Care (Signed)
Pt remained lethargic this shift; he didn't eat dinner and hs BG check 68. Writer gave pt 2 cups juice. Recheck bg 72. Pt also to receive 4 units lantus at this time App paged for clarification of order, and lantus held tonight. Pt remains oriented; " I didn't eat my dinner". Denies any pain. He was incontinent of ostomy stooling. Writer assisted him to be cleaned up.  Cognitive function     Cognitive function will be maintained or return to baseline Maintaining        Nutrition     Patient's nutritional status is maintained or improved Maintaining        Pain/Comfort     Patient's pain or discomfort is manageable Maintaining        Psychosocial     Demonstrates ability to cope with illness Maintaining        Safety     Patient will remain free of falls Maintaining     Prevent any intentional injury Maintaining          Mobility     Patient's functional status is maintained or improved Progressing towards goal

## 2018-01-20 NOTE — Progress Notes (Signed)
Writer aware that patient will likely need a local outpatient HD slot.  Writer faxed new referral to [facility] Patient [facility]missions (Fax [phone]).  Writer requesting patient be placed at [facility] where he received treatment in the past.      SW will continue to follow for outpatient HD.    Darlyn Chamber, [place]

## 2018-01-20 NOTE — Progress Notes (Addendum)
Patient seen & chart reviewed  Per pt slept ok last night   Denies headache today   Agreeable to OOB today   Vitals:    01/19/18 1230 01/19/18 1441 01/19/18 2252 01/20/18 0657   BP: 121/51 131/55 123/56 103/50   BP Location:  Left arm Left arm Left arm   Pulse: 61 60 73 81   Resp: 12 16 16 16    Temp:  36.5 C (97.7 F) 37.3 C (99.1 F) 37.8 C (100 F)   TempSrc:  Temporal Temporal Temporal   SpO2: 100% 99% 98% 95%   Weight: 63.5 kg (139 lb 15.9 oz)      Height:           Exam:  General: alert and oriented x3 very flat affect poor eye contact   Lungs: clear respirations easy   Heart: reg s1s2   Abd: soft NT   Ostomy bag  light brown stool   Ext: RUE fistula good bruit   no edema         Labs: reviewed      Recent Labs  Lab 01/19/18  0157 01/14/18  0447   WBC 6.4 8.0   Hemoglobin 9.3* 9.8*   Hematocrit 28* 30*   Platelets 125* 149*         Lab results: 01/20/18  0559 01/19/18  0157 01/18/18  0107   Sodium 131* 129* 129*   Potassium 5.0 5.7* 4.9   Chloride 85* 86* 82*   CO2 29* 24 31*   UN 29* 69* 51*   Creatinine 8.63* 13.18* 10.16*   GFR,Caucasian 6* 3* 5*   GFR,Black 6* 4* 5*   Glucose 111* 163* 240*   Calcium 9.7 10.1 10.1       No results for input(s): ALT, AST in the last 168 hours.    No components found with this basename: ALKPHOS, BILITOT, BILIDIR, PROT, LABALBU  No results for input(s): INR in the last 168 hours.      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, FAP with ostomy [name] [phone]), type II DM (now well controlled), Barrett;s esophagus, HTN, prostate cancer with TURP presented with severe hyperkalemia- K 9.5 on admit with EKG changes- due to missed HD    Insomnia: Please support sleep wake cycle up OOB during day   Trazodone 25 at hs (per pt this helped)     Hyperkalemia:K  5 this am (HD yesterday)   ?increase veltessa dose will discuss with nephrology  Will give 8.4 now and increase to 16.8 daily   Am BMP     Headache: hx exam and labs not consistent with TA  -resolved today   Will avoid opiates ad they are not indicated in the management of headaches     ESRD of note was using kayexelate prn as outpatient,   He does not have outpt HD slot social worker sending packet   veltessa   URR (recirculation labs) with next HD Ms Gross ordered and will follow up     Type II DM: overall at goal one low last night (68) will follow )   per chart review on levemir at home 35 units-  here ordered for 4units lantus- SSI with 4 units  nutritional dose    Cognitive decline: TSH, B12 WNL, seen by OT recommending SNF rehab- will have OT resee pt as he is ambulating independently   holding neurontin SW aware- ? Refer to [facility] clinic as outpatient    Painful cannulation of Kock pouch: CRS consulted he will think about surgery in future-     Depression: ?have [facility] see  Also would try and do interventions and evaluations in the afternoon as he is more awake and agreeable then    DVT prophylaxis: SCD's as allergic to heparin    Dispo: cleared PT awaiting OT to see   Needs HD slot and ?home plan SW to speak with daughter   Graciela Husbands, NP  2:53 PM  01/20/2018      Scheduled Meds:   traZODone  25 mg Oral Nightly    insulin lispro  0-30 Units Subcutaneous TID WC    patiromer  8.4 g Oral Daily    insulin glargine  4 Units Subcutaneous Nightly    dorzolamide  1 drop Left Eye 2 times per day    timolol  1 drop Left Eye 2 times per day    latanoprost  1 drop Left Eye Nightly    sevelamer  1.6 g Oral TID WC    b complex-vitamin c-folic acid  1 tablet Oral Daily    pantoprazole  40 mg Oral Daily

## 2018-01-20 NOTE — Progress Notes (Signed)
Hospital Medicine Service Attending Progress Note    Significant Events/Subjective:   Patient reports that he feels like he is "in a stupor" today.  He reports poor sleep last night.  He denies pain and reports headache has resolved.    Objective:     Physical Exam  Temp:  [36.5 C (97.7 F)-37.8 C (100 F)] 37.8 C (100 F)  Heart Rate:  [57-81] 81  Resp:  [11-21] 16  BP: (64-132)/(38-101) 103/50  63.5 kg (139 lb 15.9 oz)    Constitutional: sitting up in bed, in no acute distress   HENT: anicteric  CV: RRR  Resp: normal work of breathing, on room air, lungs CTAB  Skin/Lines: warm, dry, RUE AVF  Neuro: awake, alert    Recent Lab, Micro, and Imaging Studies   Personally reviewed and notable for:    K 5  Na 131    BGs 68-126    Current Inpatient Medications:   traZODone  25 mg Oral Nightly    insulin lispro  0-30 Units Subcutaneous TID WC    patiromer  8.4 g Oral Daily    insulin glargine  4 Units Subcutaneous Nightly    dorzolamide  1 drop Left Eye 2 times per day    timolol  1 drop Left Eye 2 times per day    latanoprost  1 drop Left Eye Nightly    sevelamer  1.6 g Oral TID WC    b complex-vitamin c-folic acid  1 tablet Oral Daily    pantoprazole  40 mg Oral Daily     melatonin, acetaminophen, Nursing communication- Give 4 OZ of fruit juice for BG < 70 mg/dl **AND** dextrose **AND** dextrose **AND** glucagon **AND** POCT glucose    Assessment:     72 y.o. male with a history of ESRD on HD, HTN, DM2, prostate ca s/p TURP, FAP s/p subtotal colectomy, GERD c/b Barrett esophagus who presented with severe hyperkalemia in the setting of missed hemodialysis.    Plans:     ESRD/hyperkalemia:  - Etiology of hyperkalemia appears missed HD and poor dietary adherence.  - K only down to 5 today despite HD session yesterday.  - URR 72% last week appears adequate.  - Fistulagram with mostly successful angioplasty although unable to open area with extrinsic compression.  - Continue low K diet.  Limit K intake from  patient's snacks which he eats in addition to food from kitchen.  - Continue Veltassa, increasing dose.   - Appreciate nephrology assistance.    HTN:  - By history.  He does not appear to be on antihypertensive meds.  Patient with intermittent hypotension with HD.  Would not plan to start BP meds.    DM2:  - BGs extremely variable due to variable PO intake.  - Continue Lantus 4u nightly.  On 35u Levemir at home.  - Continue Humalog TID AC with 4u baseline.  On 12u baseline at home.  - Concern for excessively strict glycemic control at home likely causing hypoglycemia.  A1c in March of 5.6.    Possible cognitive decline:  - B12 and TSH wnl.  - Holding gabapentin.  - PT/OT.    Glaucoma:  - Continue gtts.    GERD/Barrett esophagus:  - Continue PPI.    FAP s/p colectomy:  - Patient having pain with catheterization of Kock pouch.  CRS advising possible conversion to end ileostomy which patient is considering.  - Appreciate CRS, WOCN assistance.    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 Discharge Date (EDD): medically ready for discharge pending HD slot, safe discharge plan  Discharge Criteria/Barriers to Discharge: possible placement vs home with 24hr assistance, local HD slot  PT and/or OT Recommendations/Discharge to: home with 24hr supervision/assist  Appointments Needed with: TBD     Case discussed with APP, RN, SW, CC, OT.    Wynona Meals, MD on 01/20/2018 at 8:28 AM

## 2018-01-21 LAB — UREA NITROGEN, PRE DIALYSIS: UN Pre Dialysis: 54 mg/dL — ABNORMAL HIGH (ref 6–20)

## 2018-01-21 LAB — POCT GLUCOSE
Glucose POCT: 129 mg/dL — ABNORMAL HIGH (ref 60–99)
Glucose POCT: 166 mg/dL — ABNORMAL HIGH (ref 60–99)
Glucose POCT: 139 mg/dL — ABNORMAL HIGH (ref 60–99)

## 2018-01-21 LAB — UREA NITROGEN, POST DIALYSIS: UN post Dialysis: 13 mg/dL (ref 6–20)

## 2018-01-21 MED ORDER — BACITRACIN 500 UNIT/GM EX OINT *WRAPPED*
TOPICAL_OINTMENT | Freq: Two times a day (BID) | CUTANEOUS | Status: DC
Start: 2018-01-21 — End: 2018-01-31
  Filled 2018-01-21 (×2): qty 28

## 2018-01-21 MED ORDER — EPOETIN ALFA 3,000 UNIT/ML IJ SOLN *I*
5000.0000 [IU] | INTRAMUSCULAR | Status: DC
Start: 2018-01-21 — End: 2018-01-28
  Administered 2018-01-21 – 2018-01-28 (×4): 5000 [IU] via INTRAVENOUS
  Filled 2018-01-21 (×8): qty 1

## 2018-01-21 NOTE — Progress Notes (Signed)
DIALYSIS NOTE:    Seen and examined during HD.      Vitals:    01/21/18 1530   BP: 125/53   Pulse:    Resp:    Temp:    Weight:    Height:      General: Alert, pleasant  Chest: CTABL, normal excursion  CVS: S1 + S2 , could not hear any murmur  Legs: no edema, no rash  Neuro: Moving all limbs, alert    Labs: reviewed  .  A/P:    ESRD patients (MWF) presented with hyperkalemia after missing HD and now with headache.    Volume: EDW is 63 kg. UF as tolerted  BP: in 120's  Anemia: Hgb is 9.3 g/dL, On EPO  MBD: Please obtain PO4 with the next set of labs. Continue Renvela  Access: Left ram AVF s/p angioplasty during this admission   Continue Patiromer for now.  URR to be done today.  Low K diet  Avoid daily blood draws if possible  Renal dose meds      Victor Flowers, MBBS, FASN  Department of Medicine  Divisions of Nephrology and Palliative Care

## 2018-01-21 NOTE — Progress Notes (Addendum)
Addendum-4:10pm  Writer spoke with Rayburn Ma at [facility] Patient Admissions regarding outpatient dialysis slot.  Request submitted to [facility] location and awaiting confirmation from the clinic that they are able to accept the patient.  Rayburn Ma stated he will follow up with writer on June 13th with an update.    Writer received call from patient's sister, Dearion Huot requesting an update regarding discharge plan.  Writer advised that patient awaiting confirmed outpatient dialysis slot and will then discharge to daughter's address in [place] with home care services.      Writer advised that once dialysis confirmed will contact family with discharge date.    Darlyn Chamber, [place]

## 2018-01-21 NOTE — Progress Notes (Addendum)
Patient seen & chart reviewed  Per pt did not sleep last night -did not use trazodone   He is very clear he would not go to SNF and feels that he will do fine at home with his family including his daughter   Denies headache today   Agreeable to [place] today   Per nursing complaining of with rash to forehead   Per pt his forehead has been very itchy   Vitals:    01/21/18 0713 01/21/18 0757 01/21/18 1401 01/21/18 1419   BP:  127/57 (!) 117/47 121/51   BP Location:  Left arm     Pulse:  71 70    Resp:  16 19    Temp:  36.9 C (98.4 F) 36.2 C (97.2 F)    TempSrc:  Temporal     SpO2:  100% 98% 99%   Weight: 62.4 kg (137 lb 9.6 oz)  62.4 kg (137 lb 9.1 oz)    Height:           Exam:  General: alert and oriented x3 very flat affect poor eye contact   Head: circular approx 6 cm hyperpigmented lesion to forehead with irregular borders there is some abraded skin in the center   Eyes: left eye glaucoma right eye PERRLA   Lungs: clear respirations easy   Heart: reg s1s2   Abd: soft NT   Ostomy bag  light brown stool   Ext: RUE fistula good bruit   no edema         Labs: reviewed      Recent Labs  Lab 01/19/18  0157   WBC 6.4   Hemoglobin 9.3*   Hematocrit 28*   Platelets 125*         Lab results: 01/20/18  0559 01/19/18  0157 01/18/18  0107   Sodium 131* 129* 129*   Potassium 5.0 5.7* 4.9   Chloride 85* 86* 82*   CO2 29* 24 31*   UN 29* 69* 51*   Creatinine 8.63* 13.18* 10.16*   GFR,Caucasian 6* 3* 5*   GFR,Black 6* 4* 5*   Glucose 111* 163* 240*   Calcium 9.7 10.1 10.1       No results for input(s): ALT, AST in the last 168 hours.    No components found with this basename: ALKPHOS, BILITOT, BILIDIR, PROT, LABALBU  No results for input(s): INR in the last 168 hours.      Recent Labs  Lab 01/21/18  1345 01/21/18  0939 01/20/18  1848 01/20/18  1346 01/20/18  0957 01/19/18  2248 01/19/18  2227 01/19/18  1851   Glucose POCT 166* 129* 144* 191* 159* 72 86 103*       A/P:   72 year old male with PMH significant for CKD V on HD, FAP  with ostomy Victor Flowers [id number]), type II DM (now well controlled), Barrett;s esophagus, HTN, prostate cancer with TURP presented with severe hyperkalemia- K 9.5 on admit with EKG changes- due to missed HD    Skin lesion: (contact dermatitis seborrhoic keratosis ?underlying melanoma) will treat with some bacitracin and follow consider derm for bx if not improving     Insomnia: Please support sleep wake cycle up OOB during day   Trazodone 25 at hs (reviewed with pt he has prn trazodone -this worked the night before)      Hyperkalemia:K  pending today    veltessa 16.8 daily   Am BMP ordered for some reason BMP not drawn at HD  today     Headache: hx exam and labs not consistent with TA  -resolved today   Will avoid opiates ad they are not indicated in the management of headaches     ESRD:  He does not have outpt HD slot social worker sending packet   URR (recirculation labs) with next HD Ms Gross ordered and will follow up today     Type II DM: improved  per chart review on levemir at home 35 units- here ordered for 4units lantus- SSI with 4 units  nutritional dose    Cognitive decline: TSH, B12 WNL, seen by OT recommending SNF rehab- will have OT resee pt as he is ambulating independently   holding neurontin SW aware- ? Refer to [facility]er adults clinic as outpatient    Painful cannulation of Kock pouch: CRS consulted he will think about surgery in future-     Depression: ?have [place] see  Also would try and do interventions and evaluations in the afternoon as he is more awake and agreeable then      DVT prophylaxis: SCD's as allergic to heparin    Dispo: cleared PT  OT recommend 24 hour supervision pt not agreeable -daughter is setting up at home , has a lot of family that can help (will not be there at all times-per daughter he can do what he needs to but chooses not to)   Needs HD slot  Needs PCP   Graciela Husbands, NP  2:46 PM  01/21/2018      Scheduled Meds:   epoetin alfa  5,000 Units Intravenous Once per day  on Mon Wed Fri    bacitracin   Topical 2 times per day    patiromer  16.8 g Oral Daily    traZODone  25 mg Oral Nightly    insulin lispro  0-30 Units Subcutaneous TID WC    insulin glargine  4 Units Subcutaneous Nightly    dorzolamide  1 drop Left Eye 2 times per day    timolol  1 drop Left Eye 2 times per day    latanoprost  1 drop Left Eye Nightly    sevelamer  1.6 g Oral TID WC    b complex-vitamin c-folic acid  1 tablet Oral Daily    pantoprazole  40 mg Oral Daily

## 2018-01-21 NOTE — Progress Notes (Signed)
Report Given To  01/21/18  Pt tol. 3'07" of  HD. Net fluid removed: 0.8L      Descriptive Sentence / Reason for Admission   Duration of tx: 187 mins.   Complications: Hypotension. Ufg lowered and NS bolus given w/ good effect. Pt request to end tx w/ 53 mins left AMA. Elfredia Nevins, MD aware.         Active Issues / Relevant Events            Review of medications administered  such as routine meds, antibiotics, and heparin: Epogen.   Vital Signs: See flow sheet.         To Do List  Medications still needing administration: None HD related.   Dressing change due : 6 hrs post tx.       Anticipatory Guidance / Discharge Planning  Date of next tx: TBD by Nephrologist.   Heide Guile, RN

## 2018-01-21 NOTE — Progress Notes (Signed)
Hospital Medicine Service Attending Progress Note    Significant Events/Subjective:   Patient complains that he feels sleepy all the time.  He admits to not sleeping well last night.  He denies dyspnea, pain.  He reports eating and drinking well.  He adamantly refuses any consideration of SNF placement.    Objective:     Physical Exam  Temp:  [36.8 C (98.2 F)-37.1 C (98.8 F)] 36.8 C (98.2 F)  Heart Rate:  [67-72] 72  Resp:  [16] 16  BP: (115-120)/(54-56) 120/56  62.4 kg (137 lb 9.6 oz)    Constitutional: sitting up in bed, sleepy but in no acute distress   HENT: anicteric  CV: RRR  Resp: normal work of breathing, on room air, lungs CTAB  Skin/Lines: warm, dry, RUE AVF, small area of ecchymosis on superior forehead  Neuro: awake, alert    Recent Lab, Micro, and Imaging Studies   Personally reviewed and notable for:    BGs 100s    Current Inpatient Medications:   patiromer  16.8 g Oral Daily    traZODone  25 mg Oral Nightly    insulin lispro  0-30 Units Subcutaneous TID WC    insulin glargine  4 Units Subcutaneous Nightly    dorzolamide  1 drop Left Eye 2 times per day    timolol  1 drop Left Eye 2 times per day    latanoprost  1 drop Left Eye Nightly    sevelamer  1.6 g Oral TID WC    b complex-vitamin c-folic acid  1 tablet Oral Daily    pantoprazole  40 mg Oral Daily     melatonin, acetaminophen, Nursing communication- Give 4 OZ of fruit juice for BG < 70 mg/dl **AND** dextrose **AND** dextrose **AND** glucagon **AND** POCT glucose    Assessment:     72 y.o. male with a history of ESRD on HD, HTN, DM2, prostate ca s/p TURP, FAP s/p subtotal colectomy, GERD c/b Barrett esophagus who presented with severe hyperkalemia in the setting of missed hemodialysis.    Plans:     ESRD/hyperkalemia:  - Etiology of hyperkalemia appears missed HD and poor dietary adherence.  - URR 72% last week appears adequate.  Will recheck with HD today.  - Fistulagram with mostly successful angioplasty although unable to  open area with extrinsic compression.  - Continue low K diet.  Limit K intake from patient's snacks which he eats in addition to food from kitchen.  - Continue Veltassa.  - Appreciate nephrology assistance.    HTN:  - By history.  He does not appear to be on antihypertensive meds.  Patient with intermittent hypotension with HD.  Would not plan to start BP meds.    DM2:  - BGs extremely variable due to variable PO intake.  - Continue Lantus 4u nightly.  On 35u Levemir at home.  - Continue Humalog TID AC with 4u baseline.  On 12u baseline at home.  - Concern for excessively strict glycemic control at home likely causing hypoglycemia.  A1c in March of 5.6.    Possible cognitive decline:  - B12 and TSH wnl.  - Holding gabapentin.  - PT/OT.    Glaucoma:  - Continue gtts.    GERD/Barrett esophagus:  - Continue PPI.    FAP s/p colectomy:  - Patient having pain with catheterization of Kock pouch.  CRS advising possible conversion to end ileostomy which patient is considering.  - Appreciate CRS, WOCN assistance.    DVT PPx:  SCDs (hx of HIT)    Code Status: Full Code    Discharge Planning:   Est. Discharge Date (EDD): medically ready for discharge pending HD slot, safe discharge plan  Discharge Criteria/Barriers to Discharge: possible placement (patient refuses) vs home with 24hr assistance (does not appear available/possible), local HD slot  PT and/or OT Recommendations/Discharge to: home with 24hr supervision/assist vs SNF although patient refusing these options  Appointments Needed with: TBD     Case discussed with APP, RN, SW, CC.    Wynona Meals, MD on 01/21/2018 at 7:50 AM

## 2018-01-22 LAB — POCT GLUCOSE
Glucose POCT: 159 mg/dL — ABNORMAL HIGH (ref 60–99)
Glucose POCT: 153 mg/dL — ABNORMAL HIGH (ref 60–99)
Glucose POCT: 117 mg/dL — ABNORMAL HIGH (ref 60–99)
Glucose POCT: 155 mg/dL — ABNORMAL HIGH (ref 60–99)

## 2018-01-22 NOTE — Consults (Signed)
Ordered correct pouches and wafers for pt.  Discussed with staff nurse. Pt. Independent in care.      Gonzella Lex MS RN [facility]  Pager [phone]

## 2018-01-22 NOTE — Progress Notes (Signed)
Writer spoke to pts daughter Victor Flowers yesterday 6/12 afternoon in regards to home care. She said she was so glad that someone would be coming in everyday. Writer told her that Home care is short term and intermittent, that the 1st week the nurse maybe there 2-3 times a week and its not for the whole day. Writer told her the first visit would be about 1 1/2 hrs and subsequent visits would be about 30 minutes. Victor Flowers said, " well then who is going to give him his meds every morning". She stated she cant be there during the day as she has 2 children to get ready for school and once they leave she goes to work for the whole day. Writer mentioned pts ex-wife and sibling who are local and Victor Flowers said there is animosity between her Dad and his ex-wife, she will go there to check on him but they don't get along. She said his siblings maybe able to check on him but not everyday. Writer asked her How her Dad is with washing up, cooking, taking care of himself ect. She said "look I don't even really know my Dad he moved away a long time ago and I have only seen him a handful of times".  Victor Flowers stated that his house in [place] is like a house on the hoarders show and she doesn't think her Dad has actually had a shower in 5 years, she said he might just wash up some at the sink but he's not very hygenic, also that he makes poor food choices. Daughter expressed concerns over her Dad going to the house in [place] by himself. Writer told pts daughter she needs to express her concerns to the providers on her Dads floor. Daughter would like to talk to the Social worker today 6/13 after 1pm. Writer informed [place] of above.    Clarice Pole RN for [name] RN [phone]  After Hours [phone] option 4  Weekends and [place] [phone]  [place]

## 2018-01-22 NOTE — Progress Notes (Signed)
Dialysis progress note 01/22/2018 10:57 AM    Subjective:  No acute events overnight. Patient seen and examined in room. Sleeping. Did not wake for examine.     Objective:  Vitals:    01/21/18 1737 01/21/18 1742 01/21/18 1837 01/22/18 0800   BP: 110/57 (!) 124/48 138/63    BP Location:   Left arm    Pulse:   87    Resp:  19 18 16    Temp:  36.2 C (97.2 F) 37.6 C (99.7 F)    TempSrc:   Temporal    SpO2: 100% 100% 98%    Weight:       Height:         Intake/Output last 3 shifts:I/O last 3 completed shifts:  06/12 0700 - 06/13 0659  In: 600 (9.6 mL/kg) [I.V.:600 (0.4 mL/kg/hr)]  Out: 1429 (22.9 mL/kg) [Other:1429]  Net: -829  Weight: 62.4 kg     Gen: Sleeping   Eyes: anicteric sclerae  Mouth: Moist and pink   CVS: RRR, no rub, no gallop  Lungs: Clear bilaterally   Abd: Soft non tender non distended  Ext: Edema none   AVF       Recent Labs  Lab 01/20/18  0559 01/19/18  0157 01/18/18  0107   Sodium 131* 129* 129*   Potassium 5.0 5.7* 4.9   Chloride 85* 86* 82*   CO2 29* 24 31*   UN 29* 69* 51*   Creatinine 8.63* 13.18* 10.16*   Glucose 111* 163* 240*   Calcium 9.7 10.1 10.1     No results found for: PTH, VID25   Recent Labs  Lab 01/19/18  0157   WBC 6.4   Hemoglobin 9.3*   Hematocrit 28*   Platelets 125*         Component Value Date/Time    FE 43 (L) 01/12/2018 0537    IBC 221 (L) 01/12/2018 0537    FESAT 19 (L) 01/12/2018 0537    FER 659 (H) 01/12/2018 0537        No results found.         Assessment/Plan  Victor Flowers is a 72 y.o. Male ESRD on HD admitted for hyperkalemia induced weakness. Etiology of hyperkalemia may be related to dietary indiscretion. Improved. Awaiting outpatient HD slot and possible SNF.     Plan:  1. Hemodialysis Note:   - HD tomorrow   - Follow chemistry   - Dose adjust medications for CrCl<10   - Nephrovite   - Patiromer   - URR was done on Wednesday - Clearance is adequate     2. Anemia:Goal hemoglobin 10-11 g/dl   Hgb as above, slightly below goal range   EPO 5000 units with HD  Follow  CBC     3. CKD-MBD:   Calcium as above, acceptable   Continue Sevelamer.   Low phos diet please   Please check serum phosphate level with next blood draw     4. Volume/Blood Pressure:  BP as above, controlled   Continue current medications     5. Access:   - AVF   - Fistual gram performed on 01/11/2018    Thank you for allowing [place] to participate in the care of this patient  Please call with questions, attending recommendations to follow.     Author: Eugenio Hoes, NP  as of: 01/22/2018  at: 10:57 AM   Pager [phone]

## 2018-01-22 NOTE — Consults (Signed)
Medical Nutrition Therapy - Follow Up    Admit Date: 01/09/2018    Patient Summary: per chart, 72 y.o. male with a history of ESRD on HD, HTN, DM2, prostate ca s/p TURP, FAP s/p subtotal colectomy, GERD c/b Barrett esophagus who presented with severe hyperkalemia in the setting of missed hemodialysis.    Pertinent Meds: reviewed   Scheduled Meds:   epoetin alfa  5,000 Units Intravenous Once per day on Mon Wed Fri    patiromer  16.8 g Oral Daily    insulin lispro  0-30 Units Subcutaneous TID WC    insulin glargine  4 Units Subcutaneous Nightly    sevelamer  1.6 g Oral TID WC    b complex-vitamin c-folic acid  1 tablet Oral Daily    pantoprazole  40 mg Oral Daily     Pertinent Labs: reviewed.      Reviewed I/O's: Colostomy output 1075 mL 6/11; 250 mL 6/13 (so far)     Enteral or parenteral access: PIV    Food allergies: NKFA    Current diet: diabetic, low K+, low PO4  Supplements: Nepro TID    Nutrition Focused Physical Exam:    Edema: none noted per RN shift assessment  Abdomen: +BS; stool device; +flatus per RN shift assessment  Skin: Bruising; redness around buttocks  Body fat stores: deferred, receiving nursing care  Lean body mass stores: deferred, receiving nursing care      Anthropometrics:  Height: 182.9 cm (6')    Current Weight: 62.4 kg (137 lbs 9 oz) as of 01/21/18 ; 75% IBW  Ideal Body Weight: 83.6 kg + 10% (based on ideal BMI = 25 for persons age > 65 years)   BMI: 18.7 kg/(m^2) underweight (based on BMI 23-22 > 63 years of age)  Weight Hx: overall weight loss of 1.96 kg (3%) in 10 days noted     WEIGHTS Weight (metric) Weight (standard) Weight Method   01/21/2018 62.4 kg 137 lbs 9 oz --   01/21/2018 62.415 kg 137 lbs 10 oz --   01/19/2018 63.5 kg 140 lbs --   01/19/2018 64.5 kg 142 lbs 3 oz --   01/19/2018 64.456 kg 142 lbs 2 oz --   01/16/2018 64.5 kg 142 lbs 3 oz --   01/16/2018 64.5 kg 142 lbs 3 oz --   01/14/2018 62.7 kg 138 lbs 4 oz --   01/14/2018 64.7 kg 142 lbs 10 oz --   01/14/2018 64.728 kg 142 lbs 11 oz  Actual   01/11/2018 64.365 kg 141 lbs 14 oz Actual   01/10/2018 70.1 kg 154 lbs 9 oz --   03/28/2017 69.6 kg 153 lbs 7 oz Actual     Estimated Nutrient Needs: (Based on CBW kg)    1900 - 2200 kcal/day (30 - 35 kcal/kg)   75 - 90 g protein/day (1.2 - 1.4 g/kg)    Fluid: urine output plus 500 to 1000 mL per day OR per team    Nutrition Assessment and Diagnosis:   Chart reviewed.  Notable 6/11: Na(131) is low and  K+(5) is WNL. As of 6/13: BG(159) is within acceptable limits.  In setting of HD, pt with diabetic, low K+, low PO4 diet order and with insulin regimen, Veltassa, sevelamer and nephro-vite.  Per flowsheet, pt with PO intake of 50% of breakfast and 100% of lunch 6/11.     Briefly visited with pt this AM.  Pt receiving nursing care for ostomy.  Pt report not drinking the  Nepro and stating it is too sweet. Discussed decreasing to once day.    Malnutrition Status: Insufficient data to diagnosis malnutrition at this time. Pt at high nutrition risk relate to overall weight loss of 1.96 kg (3%) in last 10 days (6/2- 6/12)     Nutrition Intervention:   1. Continue diabetic, low K+, low PO4 diet order   2. Can decrease Nepro to once daily  3. Recommend a trial of Prosource 30 mL once daily  4. Continue Nephro-vite  5. Veltassa and sevelamer per Nephrology  6. Continue Monitoring:   - % of meals consumed in flowsheet and appreciate RN assistance.    - weight at least weekly    Nutrition Monitoring/Evaluation:   1. Will monitor diet tolerance and intake, nutrition-related labs, weight trend, BM pattern, and supplement acceptance.   2. Will follow up per high nutrition risk protocol.    Tevion Laforge A. Ned Clines PhD, [place]  Pager # [phone]

## 2018-01-22 NOTE — Continuity of Care (Signed)
Acute Care Coordinator / Discharge Planning:    Per provider notes 01/19/2018:  72 y.o. male with a history of ESRD on HD, HTN, DM2, prostate ca s/p TURP, FAP s/p subtotal colectomy, GERD c/b Barrett esophagus who presented with severe hyperkalemia in the setting of missed hemodialysis.    ACC discharge planning:   Discussed discharge during morning rounds with interdisciplinary team, patient is not medically ready for discharge. Patient has cleared PT and is working with OT. If he clears OT he will be a home plan. SW working on a slot for HD. He was a [facility] [facility] prior to admission. Writer to speak with daughter for more discharge planning. Anticipate medical readiness Tuesday.     Addendum 6/11 - Writer spoke to both patient and daughter [name] [phone] regarding discharge planning. Writer had little interaction with patient. He was agreeable to home care services with [facility]. Patient will receive CHN for medication management. Writer attempted to determine who the vendor for patient's ostomy was, no information was obtained. Patient lay in bed with eyes closed stating he could not remember the information.     Writer did speak at length with daughter Marzetta Board. She also wanted home care services for her father. Marzetta Board stated that her father does not take care of himself. He does not wash up or shower, he has not showered in months. 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 Marzetta Board does have the keys and her fathers car. She states today that it is her fathers car and if he wants it back she can't say no. She is trying to have her father stay here in [place]. Patient does not have a PCP here, agreeable to new provider at [facility]. Family will work together to provide transportation to medical appts and HD, after 6/25 Marzetta Board will provide most of the transportation d/t she will be off work for the summer.     Addendum 6/13 - Writer spoke to [facility] at day end Wednesday regarding telephone conversation  she had with patient's daughter [name]. [facility] informed Probation officer that daughter had stated CHN would be coming to patient home daily, giving him his medications daily. [place] instructed Stacy that CHN would not be coming to home daily to check on him. Marzetta Board stated she does not even really know her father and has only seen him a handful of times. Daughter has asked to speak with SW. Patient may be appropriate for HLOC.    Address/phone/PCP: all verified - patient will stay at new address [place], [place] [address]  Home Set Up/Lives With: Patient will stay alone in home which is (3) minutes down the road from his daughter Marzetta Board and (2) minutes down the road from [place]  CG/Supports/ADL's: Chales Abrahams and two sisters will check in on patient at least daily and clean up after him and assist with meals and grocery store        Medication Management: Patient manages; CHN to assist with medication management          Pharmacy: [facility]  Transportation: Marzetta Board will provide transportation at discharge         Keys/Clothing:  Marzetta Board will bring clothes and keys for home          [facility] Care: [facility]  PT/OT recommendations:  Home PT and OT  Plan/Dispo: patient to be d/c when medically stable.     I will continue to follow patient progress for discharge planning. Please call with any questions or concerns. Thank you!    Lucy Antigua, RN   Acute Care Coordinator/Home Care Liaison    Phone [phone]  Pager [phone]

## 2018-01-22 NOTE — Progress Notes (Signed)
Hospital Medicine Service Attending Progress Note    Significant Events/Subjective:   Patient reports feeling tired today.  He says that he does not feel like doing anything.  He reiterates his desire to not go to SNF, etc on discharge.  He reports being able to care for himself with the assistance of his family.  When I explained the concerns that I and the rest of his team have about this plan, he maintains the same plan.  However, he is unable or unwilling to explain our concerns or explain how he will be able to care for himself.    Objective:     Physical Exam  Temp:  [36.2 C (97.2 F)-37.6 C (99.7 F)] 37.6 C (99.7 F)  Heart Rate:  [70-87] 87  Resp:  [16-19] 16  BP: (65-153)/(40-123) 138/63  62.4 kg (137 lb 9.1 oz)    Constitutional: sitting up in bed, sleepy but in no acute distress   HENT: anicteric  CV: RRR  Resp: normal work of breathing, on room air, lungs CTAB  GI: soft, non-tender, ostomy with significant leakage around bag  Skin/Lines: warm, dry, RUE AVF, small area of ecchymosis on superior forehead  Neuro: awake, alert    Recent Lab, Micro, and Imaging Studies   Personally reviewed and notable for:    BGs 100s    Current Inpatient Medications:   epoetin alfa  5,000 Units Intravenous Once per day on Mon Wed Fri    bacitracin   Topical 2 times per day    patiromer  16.8 g Oral Daily    traZODone  25 mg Oral Nightly    insulin lispro  0-30 Units Subcutaneous TID WC    insulin glargine  4 Units Subcutaneous Nightly    dorzolamide  1 drop Left Eye 2 times per day    timolol  1 drop Left Eye 2 times per day    latanoprost  1 drop Left Eye Nightly    sevelamer  1.6 g Oral TID WC    b complex-vitamin c-folic acid  1 tablet Oral Daily    pantoprazole  40 mg Oral Daily     melatonin, acetaminophen, Nursing communication- Give 4 OZ of fruit juice for BG < 70 mg/dl **AND** dextrose **AND** dextrose **AND** glucagon **AND** POCT glucose    Assessment:     72 y.o. male with a history of ESRD on  HD, HTN, DM2, prostate ca s/p TURP, FAP s/p subtotal colectomy, GERD c/b Barrett esophagus who presented with severe hyperkalemia in the setting of missed hemodialysis.    Plans:     ESRD/hyperkalemia:  - Etiology of hyperkalemia appears missed HD and poor dietary adherence.  - URR 72% last week.  Repeat URR yesterday 76%.  - Fistulagram with mostly successful angioplasty although unable to open area with extrinsic compression.  - Continue low K diet.  Limit K intake from patient's snacks which he eats in addition to food from kitchen.  - Continue Veltassa.  - Appreciate nephrology assistance.    HTN:  - By history.  He does not appear to be on antihypertensive meds.  Patient with intermittent hypotension with HD.  Would not plan to start BP meds.    DM2:  - BGs extremely variable due to variable PO intake.  - Continue Lantus 4u nightly.  On 35u Levemir at home.  - Continue Humalog TID AC with 4u baseline.  On 12u baseline at home.  - Concern for excessively strict glycemic control at home  likely causing hypoglycemia.  A1c in March of 5.6.    Possible cognitive decline:  - B12 and TSH wnl.  - Holding gabapentin.  - PT/OT recommending 24 hour supervision/assistance.  Patient's family is unable to provide.  Patient is refusing placement.  However, his capacity to make this decision is unclear as he does not really engage in discussion.  - [facility] consult for possible depression and dispo capacity.    Glaucoma:  - Continue gtts.    GERD/Barrett esophagus:  - Continue PPI.    FAP s/p colectomy:  - Patient having pain with catheterization of Kock pouch.  CRS advising possible conversion to end ileostomy which patient is considering.  - Appreciate CRS, WOCN assistance.    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 Discharge Date (EDD): medically ready for discharge pending HD slot, safe discharge plan  Discharge Criteria/Barriers to Discharge: possible placement (patient refuses but  with unclear capacity) vs home with 24hr assistance (does not appear available/possible), local HD slot  PT and/or OT Recommendations/Discharge to: home with 24hr supervision/assist vs SNF although patient refusing these options  Appointments Needed with: TBD     Case discussed with APP, RN, SW, CC, PRIME team.    Wynona Meals, MD on 01/22/2018 at 8:20 AM

## 2018-01-22 NOTE — Progress Notes (Signed)
Patient is scheduled for hemodialysis 1st shift tomorrow morning. Transporters will arrive for patient between 0700-0800. Please obtain weight prior to pick up, ensure the patient is ready for transport, and administer any pre-dialysis medications. Please consult pharmacy if you are unsure of medications that should be held prior to HD.     Please weigh patient daily for accurate fluid monitoring.    Thank you.    Myrtle Haller, RN

## 2018-01-22 NOTE — Progress Notes (Signed)
Patient seen & chart reviewed  Is tired- would not engage in conversation   Told him his daughter is unable to take him to her house as she does not have support for 24 hour a day care -which he currently needs.  He said "I don't care I am not going to a nursing home- now get me a glass of water and tylenol"   Vitals:    01/21/18 1737 01/21/18 1742 01/21/18 1837 01/22/18 0800   BP: 110/57 (!) 124/48 138/63    BP Location:   Left arm    Pulse:   87    Resp:  19 18 16    Temp:  36.2 C (97.2 F) 37.6 C (99.7 F)    TempSrc:   Temporal    SpO2: 100% 100% 98%    Weight:       Height:           Exam:  General: when arrived he was lying on side- spoke with eyes closed the entire time I was with him  Lungs: clear  Heart: RRR  Abd: +BS soft NT- ostomy with soft stool in bag  Ext: no edema. RUE AVF + bruit and thrill        Recent Labs  Lab 01/22/18  1005 01/21/18  1851 01/21/18  1345 01/21/18  0939 01/20/18  1848 01/20/18  1346 01/20/18  0957 01/19/18  2248   Glucose POCT 159* 139* 166* 129* 144* 191* 159* 18       A/P:  72 year old male with PMH significant for CKD V on HD, FAP with ostomy [name] [phone]), type II DM (now well controlled), Barrett;s esophagus, HTN, prostate cancer with TURP presented with severe hyperkalemia- K 9.5 on admit with EKG changes- due to missed HD- now awaiting safe disposition    CKD V hyperkalemia: on valtessa, next HD tomorrow, SW working on HD slot for discharge    Type II DM: stable on 4 units lantus and 4 unit nutritional baseline    ? Depression: have asked [facility] psychiatry to see him    DVT prophylaxis: SCD's  Dispo: daughter unable to provide 24 hour service, he likely has capacity to make discharge decisions- needs outpatient HD slot    Rui Wordell, NP  0:98 PM  01/22/2018    Prescriptions Prior to Admission   Medication Sig    sodium bicarbonate 650 MG tablet Take 1,300 mg by mouth 3 times daily    atorvastatin (LIPITOR) 10 MG tablet Take 10 mg by mouth daily    magnesium  oxide (MAG-OX) 400 (241.3 MG) MG tablet Take 800 mg by mouth daily    sodium polystyrene (KAYEXALATE) 15 GM/60ML suspension Take 30 g by mouth three times a week   Take if directed by MD for elevated potassium.    gabapentin (NEURONTIN) 300 MG capsule Take 300 mg by mouth daily    pantoprazole (PROTONIX) 40 MG EC tablet Take 40 mg by mouth daily   Swallow whole. Do not crush, break, or chew.    sevelamer (RENVELA) 800 MG tablet Take 1,600 mg by mouth 3 times daily (with meals)   Swallow whole. Do not crush, break, or chew.    B Complex-C-Folic Acid (RENAL-VITE) 0.8 MG TABS Take 0.8 mg by mouth at bedtime    dorzolamide-timolol (COSOPT) 22.3-6.8 MG/ML ophthalmic solution Place 1 drop into the left eye 2 times daily    HYDROcodone-acetaminophen (NORCO) 5-325 MG per tablet Take 1 tablet by mouth every 8  hours as needed for Pain    insulin detemir (LEVEMIR) 100 UNIT/ML injection vial Inject 30 Units into the skin daily   Maximum  46  Units/day     insulin lispro 100 UNIT/ML injection vial Inject 4-6 Units into the skin 3 times daily (before meals)   Maximum 18  Units/day    latanoprost (XALATAN) 0.005 % ophthalmic solution Place 1 drop into the left eye nightly    multi-vitamin (MULTIVITAMIN) per tablet Take 1 tablet by mouth daily     Scheduled Meds:   epoetin alfa  5,000 Units Intravenous Once per day on Mon Wed Fri    bacitracin   Topical 2 times per day    patiromer  16.8 g Oral Daily    traZODone  25 mg Oral Nightly    insulin lispro  0-30 Units Subcutaneous TID WC    insulin glargine  4 Units Subcutaneous Nightly    dorzolamide  1 drop Left Eye 2 times per day    timolol  1 drop Left Eye 2 times per day    latanoprost  1 drop Left Eye Nightly    sevelamer  1.6 g Oral TID WC    b complex-vitamin c-folic acid  1 tablet Oral Daily    pantoprazole  40 mg Oral Daily

## 2018-01-23 DIAGNOSIS — G9340 Encephalopathy, unspecified: Secondary | ICD-10-CM

## 2018-01-23 DIAGNOSIS — F329 Major depressive disorder, single episode, unspecified: Secondary | ICD-10-CM

## 2018-01-23 LAB — BASIC METABOLIC PANEL
Anion Gap: 16 (ref 7–16)
CO2: 30 mmol/L — ABNORMAL HIGH (ref 20–28)
Calcium: 9.2 mg/dL (ref 8.6–10.2)
Chloride: 82 mmol/L — ABNORMAL LOW (ref 96–108)
Creatinine: 11.01 mg/dL — ABNORMAL HIGH (ref 0.67–1.17)
GFR,Black: 5 * — AB
GFR,Caucasian: 4 * — AB
Glucose: 143 mg/dL — ABNORMAL HIGH (ref 60–99)
Lab: 46 mg/dL — ABNORMAL HIGH (ref 6–20)
Potassium: 4.6 mmol/L (ref 3.3–5.1)
Sodium: 128 mmol/L — ABNORMAL LOW (ref 133–145)

## 2018-01-23 LAB — EKG 12-LEAD
P: 0 deg
P: 0 deg
PR: 223 ms
PR: 233 ms
QRS: -68 deg
QRS: -73 deg
QRSD: 146 ms
QRSD: 150 ms
QT: 408 ms
QT: 412 ms
QTc: 425 ms
QTc: 439 ms
Rate: 65 {beats}/min
Rate: 68 {beats}/min
T: 180 deg
T: 189 deg

## 2018-01-23 LAB — CBC
Hematocrit: 26 % — ABNORMAL LOW (ref 40–51)
Hemoglobin: 8.9 g/dL — ABNORMAL LOW (ref 13.7–17.5)
MCH: 30 pg/cell (ref 26–32)
MCHC: 34 g/dL (ref 32–37)
MCV: 89 fL (ref 79–92)
Platelets: 122 10*3/uL — ABNORMAL LOW (ref 150–330)
RBC: 2.9 MIL/uL — ABNORMAL LOW (ref 4.6–6.1)
RDW: 15 % — ABNORMAL HIGH (ref 11.6–14.4)
WBC: 6.2 10*3/uL (ref 4.2–9.1)

## 2018-01-23 LAB — PHOSPHORUS: Phosphorus: 5.7 mg/dL — ABNORMAL HIGH (ref 2.7–4.5)

## 2018-01-23 LAB — POCT GLUCOSE
Glucose POCT: 144 mg/dL — ABNORMAL HIGH (ref 60–99)
Glucose POCT: 139 mg/dL — ABNORMAL HIGH (ref 60–99)
Glucose POCT: 108 mg/dL — ABNORMAL HIGH (ref 60–99)
Glucose POCT: 96 mg/dL (ref 60–99)

## 2018-01-23 MED ORDER — VALACYCLOVIR HCL 500 MG PO TABS *I*
500.0000 mg | ORAL_TABLET | Freq: Every day | ORAL | Status: DC
Start: 2018-01-23 — End: 2018-01-31
  Administered 2018-01-23 – 2018-01-31 (×8): 500 mg via ORAL
  Filled 2018-01-23 (×10): qty 1

## 2018-01-23 MED ORDER — MODAFINIL 100 MG PO TABS *I*
100.0000 mg | ORAL_TABLET | Freq: Every day | ORAL | Status: DC
Start: 2018-01-23 — End: 2018-01-26
  Administered 2018-01-23 – 2018-01-26 (×4): 100 mg via ORAL
  Filled 2018-01-23 (×4): qty 1

## 2018-01-23 MED ORDER — VITAMIN B-12 1000 MCG PO TABS *I*
1000.0000 ug | ORAL_TABLET | Freq: Every day | ORAL | Status: DC
Start: 2018-01-23 — End: 2018-01-28
  Administered 2018-01-23 – 2018-01-28 (×6): 1000 ug via ORAL
  Filled 2018-01-23 (×6): qty 1

## 2018-01-23 MED ORDER — ERYTHROMYCIN 5 MG/GM OP OINT *I*
TOPICAL_OINTMENT | Freq: Three times a day (TID) | OPHTHALMIC | Status: DC
Start: 2018-01-23 — End: 2018-01-31
  Filled 2018-01-23 (×4): qty 1

## 2018-01-23 NOTE — Progress Notes (Signed)
01/23/18    Patient arrived in unit for a 4 hour HD treatment. Accessed R AVG without difficulty; BFR of 400-450 throughout Tx. Labs were drawn.Goal was to remove 1-2L, keeping SBP>90. Goal was met.     Patient tolerated 4 hours of HD Tx with 1L net fluid removal.     Claris Pong, RN       Descriptive Sentence / Reason for Admission   825 minutes  Complications: None dialysis related      Active Issues / Relevant Events   Review of medications administered such as routine meds, antibiotics, and heparin: Epogen  Vital Signs: See flow sheet.         To Do List  Medications still needing administration: None dialysis related    Dressing change due: Please monitor AVG dressing for bleeding and remove bandage by tomorrow morning to avoid prolonged pressure on site, thank you.       Anticipatory Guidance / Discharge Planning  Date/Time of Next Treatment: Expected Monday, 01/26/18 per MWF HD Tx schedule    Please obtain an accurate weight daily in order to monitor fluid balance, thank you

## 2018-01-23 NOTE — Consults (Signed)
Brief Ophthalmology note:    Assessed external and cornea but patient not cooperative.     V1 Zoster with classic lesions (crusted over) with conjunctival involvement without corneal involvement yet.   Blurry vision. Without photophobia.     Patient not cooperative with exam and refusing treatment as discussed with attending when first consulted. Psych was consulted and patient appeared disengaged, depressed v encephalopathic. Discussed with nursing, will attempt again at the plan below tonight. Ophthalmology will reattempt full exam in AM.      Cool compresses BID to left eye but warm compresses to the skin lesions of the forehead and periorbita.   Erythromycin ointment to eye BID and to surrounding lesions of the eyelids and forehead.   Valtrex PO 1000mg  TID but understand attending is discussing dosing with renal/pharmacy given kidney function.   Pain management per primary team.   I will see patient in AM to reattempt full exam.     Marye Round, MD   PGY-2 Ophthalmology  [facility]  Pager [phone]

## 2018-01-23 NOTE — Progress Notes (Signed)
Hospital Medicine Service Attending Progress Note    Significant Events/Subjective:   Patient reports feeling sleepy today.  He complains of left forehead itching and blurry vision.  He denies pain on his forehead or eye.    Objective:     Physical Exam  Temp:  [36.1 C (97 F)-37 C (98.6 F)] 36.1 C (97 F)  Heart Rate:  [62-69] 64  Resp:  [16-18] 18  BP: (87-130)/(49-66) 117/54  60.4 kg (133 lb 2.5 oz)    Constitutional: sitting up in bed, sleepy but in no acute distress   HENT: anicteric  CV: RRR  Resp: normal work of breathing, on room air, lungs CTAB  GI: soft, non-tender, ostomy with significant leakage around bag  Skin/Lines: warm, dry, RUE AVF, small area of ecchymosis on superior forehead, herpetiform rash along left forehead and left side of nose although does not appear vesicular and is mostly crusted, erythematous, mildly swollen superior eyelid of left eye, limited evaluation of left eye due to poor patient cooperation but globe does not appear erythematous, scant purulent discharge appreciated  Neuro: awake, alert    Recent Lab, Micro, and Imaging Studies   Personally reviewed and notable for:    K 4.6    BGs 100s    Hgb 8.9, stable    Current Inpatient Medications:   epoetin alfa  5,000 Units Intravenous Once per day on Mon Wed Fri    bacitracin   Topical 2 times per day    patiromer  16.8 g Oral Daily    traZODone  25 mg Oral Nightly    insulin lispro  0-30 Units Subcutaneous TID WC    insulin glargine  4 Units Subcutaneous Nightly    dorzolamide  1 drop Left Eye 2 times per day    timolol  1 drop Left Eye 2 times per day    latanoprost  1 drop Left Eye Nightly    sevelamer  1.6 g Oral TID WC    b complex-vitamin c-folic acid  1 tablet Oral Daily    pantoprazole  40 mg Oral Daily     melatonin, acetaminophen, Nursing communication- Give 4 OZ of fruit juice for BG < 70 mg/dl **AND** dextrose **AND** dextrose **AND** glucagon **AND** POCT glucose    Assessment:     72 y.o. male with a  history of ESRD on HD, HTN, DM2, prostate ca s/p TURP, FAP s/p subtotal colectomy, GERD c/b Barrett esophagus who presented with severe hyperkalemia in the setting of missed hemodialysis.    Plans:     ESRD/hyperkalemia:  - Etiology of hyperkalemia appears missed HD and poor dietary adherence.  - URR 72% last week.  Repeat URR 6/12 76%.  - Fistulagram with mostly successful angioplasty although unable to open area with extrinsic compression.  - Continue low K diet.  Limit K intake from patient's snacks which he eats in addition to food from kitchen.  - Continue Veltassa.  - Appreciate nephrology assistance.    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  - Recent headache/eye pain likely represented prodromal phase.  - Start empiric valacyclovir.  - Discussed with ophthalmology who will evaluate patient, likely this evening or tonight.  They agreed with starting empiric antiviral treatment and recommended topical erythromycin ointment.    HTN:  - By history.  He does not appear to be on antihypertensive meds.  Patient with intermittent hypotension with HD.  Would  not plan to start BP meds.    DM2:  - BGs extremely variable due to variable PO intake.  - Continue Lantus 4u nightly.  On 35u Levemir at home.  - Continue Humalog TID AC with 4u baseline.  On 12u baseline at home.  - Concern for excessively strict glycemic control at home likely causing hypoglycemia.  A1c in March of 5.6.    Possible cognitive decline:  - B12 and TSH wnl.  - Holding gabapentin.  - PT/OT recommending 24 hour supervision/assistance.  Patient's family is unable to provide.  Patient is refusing placement.  However, his capacity to make this decision is unclear as he does not really engage in discussion.  - [facility] consult.    Glaucoma:  - Continue gtts.    GERD/Barrett esophagus:  - Continue PPI.    FAP s/p colectomy:  - Patient having pain with  catheterization of Kock pouch.  CRS advising possible conversion to end ileostomy which patient is considering.  - Appreciate CRS, WOCN assistance.    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 Discharge Date (EDD): likely medically ready ~6/17  Discharge Criteria/Barriers to Discharge: herpes zoster, possible placement (patient refuses but with unclear capacity) vs home with 24hr assistance (does not appear available/possible), local HD slot  PT and/or OT Recommendations/Discharge to: home with 24hr supervision/assist vs SNF although patient refusing these options  Appointments Needed with: TBD     Case discussed with APP, RN, SW, CC, PRIME team, ophthalmology resident.    >35 minutes were spent in patient care with >50% in counseling/coordination of care    Wynona Meals, MD on 01/23/2018 at 7:56 AM

## 2018-01-23 NOTE — Progress Notes (Signed)
DIALYSIS NOTE:    Seen and examined during HD.      Vitals:    01/23/18 0830   BP: 124/53   Pulse:    Resp:    Temp:    Weight:    Height:      General: Alert, pleasant  Chest: CTABL, normal excursion  CVS: S1 + S2 , could not hear any murmur  Legs: no edema, no rash  Neuro: Moving all limbs, alert    Labs: reviewed  .  A/P:    ESRD patients (MWF) presented with hyperkalemia after missing HD and now with headache.    Volume: EDW is 63 kg. UF as tolerted  BP: in 120's  Anemia: Hgb is 9.3 g/dL, On EPO  MBD: P Continue Renvela  Access: Left ram AVF s/p angioplasty during this admission   Continue Patiromer for now.  URR may not be accurate as HD was stopped early.  Water restriction to 1 L as he is becoming hyponatremic.  Low K diet  Avoid daily blood draws if possible  Renal dose meds      Victor Flowers, MBBS, FASN  Department of Medicine  Divisions of Nephrology and Palliative Care

## 2018-01-23 NOTE — Progress Notes (Addendum)
Patient seen & chart reviewed  Seen after HD  C/o some itching on forehead denies visual issues  C/o feeling tired  Vitals:    01/23/18 1145 01/23/18 1147 01/23/18 1200 01/23/18 1239   BP: (!) 130/28 (!) 134/41 (!) 106/45 122/58   BP Location:    Left arm   Pulse:  79  82   Resp:  16  16   Temp:  35.9 C (96.6 F)  37.1 C (98.8 F)   TempSrc:    Temporal   SpO2: 100% 100% 100% 96%   Weight:  64.7 kg (142 lb 10.2 oz)     Height:           Exam:  General: very distant, limited discussion  Noted rash erythematous papules above left eye forehead- pupil reacted to light but he refused to participate in exam, some crusting (but he is very disheveled) left eyelid with erythema  Lungs: clear  Heart: RRR  Abd: +BS soft NT, colostomy intact- no stool  Ext: no edema- RUE AVF CDI      Labs: reviewed      Recent Labs  Lab 01/23/18  0737 01/19/18  0157   WBC 6.2 6.4   Hemoglobin 8.9* 9.3*   Hematocrit 26* 28*   Platelets 122* 125*         Lab results: 01/23/18  0737 01/20/18  0559 01/19/18  0157   Sodium 128* 131* 129*   Potassium 4.6 5.0 5.7*   Chloride 82* 85* 86*   CO2 30* 29* 24   UN 46* 29* 69*   Creatinine 11.01* 8.63* 13.18*   GFR,Caucasian 4* 6* 3*   GFR,Black 5* 6* 4*   Glucose 143* 111* 163*   Calcium 9.2 9.7 10.1         Recent Labs  Lab 01/23/18  0745 01/22/18  2136 01/22/18  1857 01/22/18  1423 01/22/18  1005 01/21/18  1851 01/21/18  1345 01/21/18  0939   Glucose POCT 144* 155* 117* 153* 159* 55* 28* 37*     71 year old male with PMH significant for 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, FAP with ostomy [name] [phone]), type II DM (now well controlled), Barrett;s esophagus, HTN, prostate cancer with TURP presented with severe hyperkalemia- K 9.5 on admit with EKG changes- due to missed HD- now awaiting safe disposition- today noted possible herpes zoster ophthalmicus left eye and forehad     herpes zoster ophthalmicus: isolation- Dr Kerin Perna called opthalmology they will see him, added valacyclovir renally dosed, contact and airborne  isolation per nursing, erythromycin in eye    CKD V hyperkalemia: on valtessa, next HD tomorrow, SW working on HD slot for discharge    Type II DM: stable on 4 units lantus and 4 unit nutritional baseline    ? Depression: seen by psychiatry ordered methanoic acid level, LFT's, ammonia, syphilis screen and will d/w patient HIV testing before we order, ordered cyanocobalamin oral for B12 deficiency, added modafinil       DVT prophylaxis: SCD's  Dispo: daughter unable to provide 24 hour service, he likely has capacity to make discharge decisions- needs outpatient HD slot      Deneice Wack, NP  4:78 PM  01/23/2018    Prescriptions Prior to Admission   Medication Sig    sodium bicarbonate 650 MG tablet Take 1,300 mg by mouth 3 times daily    atorvastatin (LIPITOR) 10 MG tablet Take 10 mg by mouth daily    magnesium oxide (MAG-OX) 400 (  241.3 MG) MG tablet Take 800 mg by mouth daily    sodium polystyrene (KAYEXALATE) 15 GM/60ML suspension Take 30 g by mouth three times a week   Take if directed by MD for elevated potassium.    gabapentin (NEURONTIN) 300 MG capsule Take 300 mg by mouth daily    pantoprazole (PROTONIX) 40 MG EC tablet Take 40 mg by mouth daily   Swallow whole. Do not crush, break, or chew.    sevelamer (RENVELA) 800 MG tablet Take 1,600 mg by mouth 3 times daily (with meals)   Swallow whole. Do not crush, break, or chew.    B Complex-C-Folic Acid (RENAL-VITE) 0.8 MG TABS Take 0.8 mg by mouth at bedtime    dorzolamide-timolol (COSOPT) 22.3-6.8 MG/ML ophthalmic solution Place 1 drop into the left eye 2 times daily    HYDROcodone-acetaminophen (NORCO) 5-325 MG per tablet Take 1 tablet by mouth every 8 hours as needed for Pain    insulin detemir (LEVEMIR) 100 UNIT/ML injection vial Inject 30 Units into the skin daily   Maximum  46  Units/day     insulin lispro 100 UNIT/ML injection vial Inject 4-6 Units into the skin 3 times daily (before meals)   Maximum 18  Units/day    latanoprost  (XALATAN) 0.005 % ophthalmic solution Place 1 drop into the left eye nightly    multi-vitamin (MULTIVITAMIN) per tablet Take 1 tablet by mouth daily     Scheduled Meds:   valACYclovir  500 mg Oral Daily    erythromycin   Left Eye 3 times per day    modafinil  100 mg Oral Daily    cyanocobalamin  1,000 mcg Oral Daily    epoetin alfa  5,000 Units Intravenous Once per day on Mon Wed Fri    bacitracin   Topical 2 times per day    patiromer  16.8 g Oral Daily    traZODone  25 mg Oral Nightly    insulin lispro  0-30 Units Subcutaneous TID WC    insulin glargine  4 Units Subcutaneous Nightly    dorzolamide  1 drop Left Eye 2 times per day    timolol  1 drop Left Eye 2 times per day    latanoprost  1 drop Left Eye Nightly    sevelamer  1.6 g Oral TID WC    b complex-vitamin c-folic acid  1 tablet Oral Daily    pantoprazole  40 mg Oral Daily

## 2018-01-23 NOTE — Provider Consult (Signed)
Inpatient Psychiatric Consultation Note   Initial Consult Note     Consult Requested by: Dr. Whitman    Consult Question: depression    Admitting Diagnosis: ROMI, hyperkalemia, HTN    Chief Complaint: "I'm surviving."    Patient information was obtained from patient, medical record and treatment team. Notably, pt was seen by a member of the psychiatry service roughly 1 yr ago (03/25/17) when he was visiting his daughter Victor Flowers. At that time, he had shown up unannounced with the apparent goal of staying in [place] with daughter. See Victor Flowers's note from 03/25/17.    History/Exam limitations: marked psychomotor slowing & very limited verbal engagement    History of Present Illness:  Pt is a 72 y/o WM with ESRD on HD, CAD, HTN, DM2, prostate CA s/p TURP, FAP s/p subtotal colectomy and reported h/o depression (per daughter) who was admitted to [facility] as a STEMI alert on 01/09/18 (15d ago). He is a resident of [place] but was in town visiting family. He has been no more than tenuously involved in care, often disengaged on interview, and appears to require HLOC. Psychiatry is being consulted to evaluate for depression.    I evaluated Victor Flowers while he was receiving dialysis. He responded consistently to questions but his replies generally took 5-15 seconds and were mostly no more than a few words in length. He didn't provide any information spontaneously, and he did not provide narrative responses to open-ended questions. He is gross oriented to place, knows he's been in the hospital for "weeks" (he selected this out of several options) but was inconsistent regarding how long he had been on HD (at one point he said "years" and then later suggested that he had just started HD on admission [perhaps he meant that he had 'restarted' it as he had missed HD prior to admission]).    He describes things as "shitty." He reports general malaise/lethargy, poor sleep, back pain due to arthritis "in my back and into my neck,"  and emotional frustration. He also alludes to be lonely in the hospital and says that his family has not come to visit him--family includes sister, daughter, and grandchildren. He admits he would like to get better, feel better, and "go back home." However, he is unable to have a meaningful conversation on where home is, what concerns there might be regarding his ability to provide self-care, and the fact that he doesn't have a current HD slot in [place].    Social/Developmental History:  From [place]. Would like to live in the  area.    Mental Status Exam  Mental Status Exam  Appearance: Disheveled  Relationship to Interviewer:  (tenuously cooperative; long latency of responses)  Psychomotor Activity: Decreased  Abnormal Movements: None  Speech :  (slowed; prolonged latency)  Language: Fluent  Mood:  ("surviving")  Affect:  (blunted; occasionally irritable)  Thought Process: Ruminative (concrete; generally goal-directed)  Thought Content: No homicidal ideation, No delusions, No obsessions/compulsions  Perceptions/Associations : No hallucinations  Sensorium: Drowsy (grossly oriented)  Cognition:  (impaired memory & attention)  Insight : Poor  Judgement: Fair    Psychiatric History: (attempted to obtain but pt not forthcoming)  Current Psych Care: no  Past Psych Care: may have had (unclear)  Previous physical/sexual abuse: unknown  Previous psychiatric hospitalizations: none known  Previous suicide attempts: no  Substance use history and treatment:  unknown  Family psychiatric history:  unknown    Past Medical History:   Diagnosis Date   •   Barrett's esophagus    • BPH (benign prostatic hyperplasia)    • ESRD needing dialysis    • FAP (familial adenomatous polyposis)    • HLD (hyperlipidemia)    • HTN (hypertension)      Past Surgical History:   Procedure Laterality Date   • IR ANGIOGRAPHY AV DIALYSIS SHUNT N/A 01/13/2018    IR ANGIOGRAPHY AV DIALYSIS SHUNT 01/13/2018 Butani, Devang, MD [facility] IMG IR/ANG   • PR  TEMPORAL ARTERY LIGATN OR BX N/A 03/28/2017    Procedure: TEMPORAL ARTERY BIOPSY;  Surgeon: Gonzalez, Mithra, MD;  Location: [facility] MAIN OR;  Service: Ophthalmology     History reviewed. No pertinent family history.  Social History     Social History   • Marital status: Divorced     Spouse name: N/A   • Number of children: N/A   • Years of education: N/A     Social History Main Topics   • Smoking status: Former Smoker   • Smokeless tobacco: Never Used   • Alcohol use No   • Drug use: No   • Sexual activity: Not Asked     Other Topics Concern   • None     Social History Narrative   • None       Allergies:   Allergies   Allergen Reactions   • Metformin Diarrhea   • Bactrim [Sulfamethoxazole-Trimethoprim] Other (See Comments)     weakness   • Heparin Other (See Comments)     Thrombocytopenia. Unclear severity. Per chart review, had a positive SRA   • Lisinopril Other (See Comments)     Weakness, renal function changes       Medications:  Prescriptions Prior to Admission   Medication Sig   • sodium bicarbonate 650 MG tablet Take 1,300 mg by mouth 3 times daily   • atorvastatin (LIPITOR) 10 MG tablet Take 10 mg by mouth daily   • magnesium oxide (MAG-OX) 400 (241.3 MG) MG tablet Take 800 mg by mouth daily   • sodium polystyrene (KAYEXALATE) 15 GM/60ML suspension Take 30 g by mouth three times a week   Take if directed by MD for elevated potassium.   • gabapentin (NEURONTIN) 300 MG capsule Take 300 mg by mouth daily   • pantoprazole (PROTONIX) 40 MG EC tablet Take 40 mg by mouth daily   Swallow whole. Do not crush, break, or chew.   • sevelamer (RENVELA) 800 MG tablet Take 1,600 mg by mouth 3 times daily (with meals)   Swallow whole. Do not crush, break, or chew.   • B Complex-C-Folic Acid (RENAL-VITE) 0.8 MG TABS Take 0.8 mg by mouth at bedtime   • dorzolamide-timolol (COSOPT) 22.3-6.8 MG/ML ophthalmic solution Place 1 drop into the left eye 2 times daily   • HYDROcodone-acetaminophen (NORCO) 5-325 MG per tablet Take 1  tablet by mouth every 8 hours as needed for Pain   • insulin detemir (LEVEMIR) 100 UNIT/ML injection vial Inject 30 Units into the skin daily   Maximum  46  Units/day    • insulin lispro 100 UNIT/ML injection vial Inject 4-6 Units into the skin 3 times daily (before meals)   Maximum 18  Units/day   • latanoprost (XALATAN) 0.005 % ophthalmic solution Place 1 drop into the left eye nightly   • multi-vitamin (MULTIVITAMIN) per tablet Take 1 tablet by mouth daily     Current Facility-Administered Medications   Medication Dose Route Frequency   • epoetin alfa (EPOGEN,PROCRIT)   injection 5,000 Units  5,000 Units Intravenous Once per day on Mon Wed Fri   • bacitracin ointment   Topical 2 times per day   • patiromer (VELTASSA) 16.8 g packet 16.8 g  16.8 g Oral Daily   • traZODone (DESYREL) tablet 25 mg  25 mg Oral Nightly   • insulin lispro (HumaLOG,ADMELOG) injection 0-30 Units  0-30 Units Subcutaneous TID WC   • melatonin tablet 3 mg  3 mg Oral QHS PRN   • insulin glargine (LANTUS) injection 4 Units  4 Units Subcutaneous Nightly   • acetaminophen (TYLENOL) tablet 650 mg  650 mg Oral Q6H PRN   • dextrose (GLUTOSE) 40 % oral gel 15 g  15 g Oral PRN    And   • dextrose 50% (0.5 g/mL) injection 25 g  25 g Intravenous PRN    And   • glucagon (GLUCAGEN) injection 1 mg  1 mg Intramuscular PRN   • dorzolamide (TRUSOPT) 2 % ophthalmic solution 1 drop  1 drop Left Eye 2 times per day   • timolol (TIMOPTIC) 0.25 % ophthalmic solution 1 drop  1 drop Left Eye 2 times per day   • latanoprost (XALATAN) 0.005 % ophthalmic solution 1 drop  1 drop Left Eye Nightly   • sevelamer (RENVELA) packet 1.6 g  1.6 g Oral TID WC   • b complex-vitamin c-folic acid (NEPHRO-VITE) tablet 1 tablet  1 tablet Oral Daily   • pantoprazole (PROTONIX) EC tablet 40 mg  40 mg Oral Daily       Review of Systems:  Expectations for ROS: Pertinent: 1    Extended: 2-9     Complete: 10+  Review of Systems   Constitutional: Positive for activity change, chills and  fatigue. Negative for appetite change.   Skin: Positive for rash.        Forehead itching   Psychiatric/Behavioral: Positive for decreased concentration, dysphoric mood and sleep disturbance.   All other systems reviewed and are negative.    Labs   All labs in the last 24 hours:   Recent Results (from the past 24 hour(s))   POCT glucose    Collection Time: 01/22/18  2:23 PM   Result Value Ref Range    Glucose POCT 153 (H) 60 - 99 mg/dL   POCT glucose    Collection Time: 01/22/18  6:57 PM   Result Value Ref Range    Glucose POCT 117 (H) 60 - 99 mg/dL   POCT glucose    Collection Time: 01/22/18  9:36 PM   Result Value Ref Range    Glucose POCT 155 (H) 60 - 99 mg/dL   Basic metabolic panel    Collection Time: 01/23/18  7:37 AM   Result Value Ref Range    Glucose 143 (H) 60 - 99 mg/dL    Sodium 128 (L) 133 - 145 mmol/L    Potassium 4.6 3.3 - 5.1 mmol/L    Chloride 82 (L) 96 - 108 mmol/L    CO2 30 (H) 20 - 28 mmol/L    Anion Gap 16 7 - 16    UN 46 (H) 6 - 20 mg/dL    Creatinine 11.01 (H) 0.67 - 1.17 mg/dL    GFR,Caucasian 4 (!) *    GFR,Black 5 (!) *    Calcium 9.2 8.6 - 10.2 mg/dL   Phosphorus    Collection Time: 01/23/18  7:37 AM   Result Value Ref Range    Phosphorus 5.7 (  H) 2.7 - 4.5 mg/dL   CBC    Collection Time: 01/23/18  7:37 AM   Result Value Ref Range    WBC 6.2 4.2 - 9.1 THOU/uL    RBC 2.9 (L) 4.6 - 6.1 MIL/uL    Hemoglobin 8.9 (L) 13.7 - 17.5 g/dL    Hematocrit 26 (L) 40 - 51 %    MCV 89 79 - 92 fL    MCH 30 26 - 32 pg/cell    MCHC 34 32 - 37 g/dL    RDW 15.0 (H) 11.6 - 14.4 %    Platelets 122 (L) 150 - 330 THOU/uL   POCT glucose    Collection Time: 01/23/18  7:45 AM   Result Value Ref Range    Glucose POCT 144 (H) 60 - 99 mg/dL       Vitals  BP: (!) 106/45  Temp: 35.9 °C (96.6 °F)  Temp src: Temporal  Heart Rate: 79 (via pulse ox)  Resp: 16  SpO2: 100 %  Height: 182.9 cm (6')  Weight: 64.7 kg (142 lb 10.2 oz) (bedscale)     TSH (01/11/18): 0.46  B12 (01/12/18): 312  Albumin (01/11/18): 4.1  ESR (01/19/18):  45    Formulation / Differential Diagnosis:  Pt is a 72 y/o WM with ESRD on HD, CAD, HTN, DM2, prostate CA s/p TURP, FAP s/p subtotal colectomy and reported h/o depression (per daughter) who was admitted to [facility] as a STEMI alert on 01/09/18 (15d ago). Current labs suggest that he is generally unwell. Whereas he is encephalopathic with reduced level of arousal (which extends beyond just generic somnolence), it's interesting that he is largely able to sustain attention when he attempts to do so. Although he seemed to be emotionally distant initially, he did appear to warm up with time. For instance, when I asked if he wanted me to end the interview and come back later he said, "no, it's okay." He appears to be struggling to make sense of his current physical, medical and functional limitations, which is made all the more difficult by irritable mood. For the time being, I would focus on symptomatic management of sleep/wake and mood. Itching on forehead is also a source of distress.    - Recommend checking methylmalonic acid to confirm functional deficiency and also beginning empirical treatment for B12 deficiency while awaiting result (312 on admission)  - Consider nutrition consult  - Would evaluate erythematous pruritic papular rash on forehead  - Check LFTs including ammonia, syphilis, HIV  - Could consider offering modafinil 100 mg qAM through the weekend to see if he engages more with care    Author: Shahidah Nesbitt Alan Allegra Cerniglia, MD  as of: 01/23/2018  at: 12:39 PM

## 2018-01-23 NOTE — Progress Notes (Signed)
01/23/18 0948   [facility] Patient Class Review   Patient Class Review Inpatient   Patient class effective 01/09/18    Lucas Mallow, RN  Utilization Review  X [phone]  Pager [phone]

## 2018-01-23 NOTE — Plan of Care (Signed)
Cognitive function     Cognitive function will be maintained or return to baseline Maintaining        Mobility     Patient's functional status is maintained or improved Maintaining        Nutrition     Patient's nutritional status is maintained or improved Maintaining        Pain/Comfort     Patient's pain or discomfort is manageable Maintaining        Psychosocial     Demonstrates ability to cope with illness Maintaining        Safety     Patient will remain free of falls Maintaining          Writer assumed care of patient at 1900. Patient's ostomy bag leaking overnight, ostomy appliances replaced and reinforced with paste and tape. Writer will continue to monitor.     Van Clines, RN

## 2018-01-24 ENCOUNTER — Ambulatory Visit: Payer: Self-pay | Admitting: Ophthalmology

## 2018-01-24 DIAGNOSIS — K219 Gastro-esophageal reflux disease without esophagitis: Secondary | ICD-10-CM

## 2018-01-24 LAB — POCT GLUCOSE
Glucose POCT: 120 mg/dL — ABNORMAL HIGH (ref 60–99)
Glucose POCT: 104 mg/dL — ABNORMAL HIGH (ref 60–99)
Glucose POCT: 117 mg/dL — ABNORMAL HIGH (ref 60–99)
Glucose POCT: 206 mg/dL — ABNORMAL HIGH (ref 60–99)
Glucose POCT: 182 mg/dL — ABNORMAL HIGH (ref 60–99)

## 2018-01-24 LAB — COMPREHENSIVE METABOLIC PANEL
ALT: 15 U/L (ref 0–50)
AST: 18 U/L (ref 0–50)
Albumin: 4.5 g/dL (ref 3.5–5.2)
Alk Phos: 69 U/L (ref 40–130)
Anion Gap: 20 — ABNORMAL HIGH (ref 7–16)
Bilirubin,Total: 1.2 mg/dL (ref 0.0–1.2)
CO2: 29 mmol/L — ABNORMAL HIGH (ref 20–28)
Calcium: 9.7 mg/dL (ref 8.6–10.2)
Chloride: 81 mmol/L — ABNORMAL LOW (ref 96–108)
Creatinine: 7.75 mg/dL — ABNORMAL HIGH (ref 0.67–1.17)
GFR,Black: 7 * — AB
GFR,Caucasian: 6 * — AB
Glucose: 96 mg/dL (ref 60–99)
Lab: 29 mg/dL — ABNORMAL HIGH (ref 6–20)
Potassium: 3.9 mmol/L (ref 3.3–5.1)
Sodium: 130 mmol/L — ABNORMAL LOW (ref 133–145)
Total Protein: 7.5 g/dL (ref 6.3–7.7)

## 2018-01-24 LAB — SYPHILIS SCREEN
Syphilis Screen: NEGATIVE
Syphilis Status: NONREACTIVE

## 2018-01-24 LAB — AMMONIA: Ammonia: 15 umol/L (ref 10–47)

## 2018-01-24 MED ORDER — TRAZODONE HCL 50 MG PO TABS *I*
25.0000 mg | ORAL_TABLET | Freq: Once | ORAL | Status: AC
Start: 2018-01-24 — End: 2018-01-24
  Administered 2018-01-24: 25 mg via ORAL
  Filled 2018-01-24: qty 1

## 2018-01-24 NOTE — Progress Notes (Signed)
Hospital Medicine Service Attending Progress Note    Significant Events/Subjective:   No acute events overnight     No new complaints or symptoms     Has lesions over V1 distribution. Reports blurred vision. Evaluation by optho and no corneal involvement.     Objective:     Physical Exam  Temp:  [36 C (96.8 F)-36.7 C (98.1 F)] 36.7 C (98.1 F)  Heart Rate:  [57-67] 67  Resp:  [16-18] 18  BP: (90-98)/(50-63) 98/63       General: NAD, alert  Resp: CTAB  CVS: S1, S2, no murmurs  ABdomen: soft, nontender, BS +  Skin: vesicular lesions along V1 distribution  Ext: No edema     Recent Lab Studies:  Personally reviewed and notable for:    Recent Labs  Lab 01/23/18  0737 01/19/18  0157   WBC 6.2 6.4   Hemoglobin 8.9* 9.3*   Hematocrit 26* 28*   Platelets 122* 125*        Recent Labs  Lab 01/24/18  0445 01/23/18  0737 01/20/18  0559   Sodium 130* 128* 131*   Potassium 3.9 4.6 5.0   Chloride 81* 82* 85*   CO2 29* 30* 29*   UN 29* 46* 29*   Creatinine 7.75* 11.01* 8.63*   Glucose 96 143* 111*   Calcium 9.7 9.2 9.7   Albumin 4.5  --   --    Phosphorus  --  5.7*  --      No results for input(s): INR, PTT, PTI in the last 168 hours.    No components found with this basename: APTT        Current Inpatient Medications:   valACYclovir  500 mg Oral Daily    erythromycin   Left Eye 3 times per day    modafinil  100 mg Oral Daily    cyanocobalamin  1,000 mcg Oral Daily    epoetin alfa  5,000 Units Intravenous Once per day on Mon Wed Fri    bacitracin   Topical 2 times per day    patiromer  16.8 g Oral Daily    traZODone  25 mg Oral Nightly    insulin lispro  0-30 Units Subcutaneous TID WC    insulin glargine  4 Units Subcutaneous Nightly    dorzolamide  1 drop Left Eye 2 times per day    timolol  1 drop Left Eye 2 times per day    latanoprost  1 drop Left Eye Nightly    sevelamer  1.6 g Oral TID WC    b complex-vitamin c-folic acid  1 tablet Oral Daily    pantoprazole  40 mg Oral Daily     melatonin,  acetaminophen, Nursing communication- Give 4 OZ of fruit juice for BG < 70 mg/dl **AND** dextrose **AND** dextrose **AND** glucagon **AND** POCT glucose    Assessment:   72 y.o. male with a history of ESRD on HD, HTN, DM2, prostate ca s/p TURP, FAP s/p subtotal colectomy, GERD c/b Barrett esophagus who presented with severe hyperkalemia in the setting of missed hemodialysis.    Plans:     ESRD/hyperkalemia:  -in the setting of noncompliance to diet and HD sessions  - Fistulagram with mostly successful angioplasty although unable to open area with extrinsic compression.  - Continue low K diet.  Limit K intake from patient's snacks which he eats in addition to food from kitchen.  - Continue Veltassa.  - Appreciate nephrology assistance.  Herpes zoster ophthalmicus (V1)  - appreciate opthalmology input  - Valcyclovir TID x 7-10 days.  - continue topical erythromycin ointment, warm compresses and viroptic drops.     HTN:  - By history.  Patient with intermittent hypotension with HD.  Would not plan to start BP meds.    DM2:  - BGs extremely variable due to variable PO intake.  - Continue Lantus 4u nightly.  On 35u Levemir at home.  - Continue Humalog TID AC with 4u baseline.  On 12u baseline at home.  - Concern for excessively strict glycemic control at home likely causing hypoglycemia.  A1c in March of 5.6.    Possible cognitive decline:  - B12 and TSH wnl.  - Holding gabapentin.  - PT/OT recommending 24 hour supervision/assistance.  Patient's family is unable to provide.  Patient is refusing placement.  However, his capacity to make this decision is unclear as he does not really engage in discussion.  - PRIME consult.    Glaucoma:  - Continue gtts.    GERD/Barrett esophagus:  - Continue PPI.    FAP s/p colectomy:  - Patient having pain with catheterization of Kock pouch.  CRS advising possible conversion to end ileostomy which patient is considering.  - Appreciate CRS, WOCN assistance.    Discharge  Planning:   Est. Discharge Date (EDD): likely medically ready ~6/17  Discharge Criteria/Barriers to Discharge: herpes zoster, possible placement (patient refuses but with unclear capacity) vs home with 24hr assistance (does not appear available/possible), local HD slot  PT and/or OT Recommendations/Discharge to: home with 24hr supervision/assist vs SNF although patient refusing these options  Appointments Needed with: TBD     [facility], MBBS on 01/24/2018 at 5:30 PM

## 2018-01-24 NOTE — Plan of Care (Signed)
Cognitive function     Cognitive function will be maintained or return to baseline Maintaining        Mobility     Patient's functional status is maintained or improved Maintaining        Nutrition     Patient's nutritional status is maintained or improved Maintaining        Pain/Comfort     Patient's pain or discomfort is manageable Maintaining        Psychosocial     Demonstrates ability to cope with illness Maintaining        Safety     Patient will remain free of falls Maintaining        Writer assumed care of patient at 2300. Rounded q2 hours to make sure colostomy intact. Writer will continue to monitor

## 2018-01-24 NOTE — Consults (Signed)
Ophthalmology Consult      Patient name: Victor Flowers  DOB: [birth year]       Age: 72 y.o.  MR#: [id number]    Date: 01/24/2018    Consulting team: Floor   Reason for consult: VZV    HPI: VZV in poorly compliant encephalopathic gentlemen with psych symptoms. Refused ophthalmologic treatment and exam yesterday but more compliant today    + blurry vision today.     Past Ocular History: Uncertain history of pthisis OD. PCIOL OS. Prior Tab 2018.     Old notes from [facility] on [place] but not relevant. Still Uncertain why pthisis OD.       Current Facility-Administered Medications:     valACYclovir (VALTREX) tablet 500 mg, 500 mg, Oral, Daily, Terboss, Maryalice, NP, 761 mg at 60/73/71 0936    erythromycin (ILOTYCIN) ophthalmic ointment, , Left Eye, 3 times per day, Terboss, Maryalice, NP    modafinil (PROVIGIL) tablet 100 mg, 100 mg, Oral, Daily, Terboss, Maryalice, NP, 062 mg at 69/48/54 6270    cyanocobalamin (Vitamin B-12) tablet 1,000 mcg, 1,000 mcg, Oral, Daily, Terboss, Maryalice, NP, 3,500 mcg at 01/24/18 0936    epoetin alfa (EPOGEN,PROCRIT) injection 5,000 Units, 5,000 Units, Intravenous, Once per day on Mon Wed Fri, Gross, Carlise E, NP, 5,000 Units at 01/23/18 1136    bacitracin ointment, , Topical, 2 times per day, Graciela Husbands, NP    patiromer (VELTASSA) 16.8 g packet 16.8 g, 16.8 g, Oral, Daily, Cappiello, Caren Griffins, NP, 16.8 g at 01/22/18 1653    traZODone (DESYREL) tablet 25 mg, 25 mg, Oral, Nightly, Cappiello, Cynthia, NP, 25 mg at 01/23/18 2241    insulin lispro (HumaLOG,ADMELOG) injection 0-30 Units, 0-30 Units, Subcutaneous, TID WC, Graciela Husbands, NP, 4 Units at 01/23/18 1948    melatonin tablet 3 mg, 3 mg, Oral, QHS PRN, Prinzing, Mary, PA, 3 mg at 01/18/18 2101    insulin glargine (LANTUS) injection 4 Units, 4 Units, Subcutaneous, Nightly, Prinzing, [place], [place], 4 Units at 01/23/18 2153    acetaminophen (TYLENOL) tablet 650 mg, 650 mg, Oral, Q6H PRN, Terboss,  Maryalice, NP, 938 mg at 18/29/93 1051    Nursing communication- Give 4 OZ of fruit juice for BG < 70 mg/dl, , , PRN **AND** dextrose (GLUTOSE) 40 % oral gel 15 g, 15 g, Oral, PRN **AND** dextrose 50% (0.5 g/mL) injection 25 g, 25 g, Intravenous, PRN, 25 g at 01/12/18 1250 **AND** glucagon (GLUCAGEN) injection 1 mg, 1 mg, Intramuscular, PRN **AND** POCT glucose, , , 4x Daily AC & HS, Boehly, Clearnce Sorrel, NP    dorzolamide (TRUSOPT) 2 % ophthalmic solution 1 drop, 1 drop, Left Eye, 2 times per day, Doyle Askew, NP, 1 drop at 01/24/18 0937    timolol (TIMOPTIC) 0.25 % ophthalmic solution 1 drop, 1 drop, Left Eye, 2 times per day, Doyle Askew, NP, 1 drop at 01/24/18 0937    latanoprost (XALATAN) 0.005 % ophthalmic solution 1 drop, 1 drop, Left Eye, Nightly, Doyle Askew, NP, 1 drop at 01/23/18 2153    sevelamer (RENVELA) packet 1.6 g, 1.6 g, Oral, TID WC, Liliana Cline, MD, 1.6 g at 01/24/18 1312    b complex-vitamin c-folic acid (NEPHRO-VITE) tablet 1 tablet, 1 tablet, Oral, Daily, Liliana Cline, MD, 1 tablet at 01/24/18 0936    pantoprazole (PROTONIX) EC tablet 40 mg, 40 mg, Oral, Daily, Liliana Cline, MD, 40 mg at 01/24/18 0936  Allergies: Metformin; Bactrim [sulfamethoxazole-trimethoprim]; Heparin; and Lisinopril  Medical History:   Past Medical History:   Diagnosis Date    Barrett's esophagus     BPH (benign prostatic hyperplasia)     ESRD needing dialysis     FAP (familial adenomatous polyposis)     HLD (hyperlipidemia)     HTN (hypertension)         Surgical History:   Past Surgical History:   Procedure Laterality Date    IR ANGIOGRAPHY AV DIALYSIS SHUNT N/A 01/13/2018    IR ANGIOGRAPHY AV DIALYSIS SHUNT 01/13/2018 Alcide Clever, MD [facility] IMG IR/ANG    PR TEMPORAL ARTERY LIGATN OR BX N/A 03/28/2017    Procedure: TEMPORAL ARTERY BIOPSY;  Surgeon: Anselmo Rod, MD;  Location: [facility] MAIN OR;  Service: Ophthalmology          Objective:      Base Eye Exam     Visual Acuity (Snellen - Linear)       Right Left    Dist sc LP     Near sc  j16 poor effort and uncertain what he is muttering.           Tonometry (Tonopen, 1:26 PM)       Right Left    Pressure 18 15          Pupils       APD    Right APD by reverse.     Left None          Visual Fields       Left Right    Restrictions  Total superior temporal, inferior temporal, superior nasal, inferior nasal deficiencies          Extraocular Movement       Right Left     Full Full          Neuro/Psych     Oriented x3:  Yes    Mood/Affect:  Normal          Dilation     Both eyes:  2.5% Phenylephrine, 1.0% Tropicamide @ 1:26 PM            Slit Lamp and Fundus Exam     External Exam       Right Left    External pthisical.  many crusted lesions V1 distribution involving nose. Large discolored lesion 4cm on scalp.          Slit Lamp Exam       Right Left    Lids/Lashes normal.  swollen with few crusted lid lesions    Conjunctiva/Sclera wnl.  medial injection    Cornea band K Pseudodendrites x3 0.76mm each with negative staining/raised. No ifiltrate or haze.     Anterior Chamber unable poor view.  deep    Iris  Normal shape, size, morphology    Lens  PCIOL    Vitreous  Clear          Fundus Exam       Right Left    Disc Defer U/S due to equipment issues.  pallor T>N    C/D Ratio Vertical  0.9    Macula  Normal, no retinal whitening.    Vessels  attenuation    Periphery  Normal limited due to pt coop.                      Assessment/Plan:       1.  Herpes Zoster Ophthalmicus OS  - No evidence retinitis, choroiditis, optic neuritis, cranial nerve palsy, progressive outer  retinal necrosis (PORN)  + Pseudodendrites which are new. Patient has refused treatment since ophthalmology was consulted. But nursing informs he has been compliant since last night.     - Valcyclovir 1000MG  PO TID for 7-10 days  -Viroptic gtts OS q2h   -warm compresses over lesions of face.  -Erythromycin TID in OS and over all facial / eyelid  lesions.   -Derm consult for skin lesion of medial scalp.   - Artificial tears Q1-2HRS  - Pain control  - Consider medical evaluation for immunocompromise  - - Follow-up daily, will check on Sunday then see Monday. .    Other  2. 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 IOP wnl OU.   Workup for glaucoma outpatient or when off isolatio.     3. 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  Keratopathy since 79s. But patient unreliable and encephalopathic. Uncertain history ?trauma ?glaucoma.   No pain. LP.  Unable to ultrasound as resident ultrasound is missing and will not try to bring Attending Ultrasound machine to floor into a droplet and contact isolation room.   Defer Ultrasound.       Please page ophthalmology with any questions or concerns.  Thank for your allowing [place] to participate in the care of this patient.    Ophthalmology Clinic Phone Number: [phone]     Patient seen on call. Attending attestation to follow.    Authored by Marye Round, MD on 01/24/2018 at 1:27 PM

## 2018-01-24 NOTE — Progress Notes (Addendum)
Patient seen & chart reviewed    Refusing exam as he wants to sleep.   Reports his vision in his left eye is poor.     Vitals:    01/24/18 0000 01/24/18 0200 01/24/18 0400 01/24/18 0600   BP:       BP Location:       Pulse:       Resp: 16 16 16 16    Temp:       TempSrc:       SpO2:       Weight:       Height:           Exam:  General: disengaged male, lying in bed. Seen later walking to door requesting ice and blanket.   Head/ Face: erythematous rash to left side of face and over left eye. Some crusting. Clear drainage from eye.   Lungs:breathing easily         Labs: reviewed      Recent Labs  Lab 01/23/18  0737 01/19/18  0157   WBC 6.2 6.4   Hemoglobin 8.9* 9.3*   Hematocrit 26* 28*   Platelets 122* 125*         Lab results: 01/24/18  0445 01/23/18  0737 01/20/18  0559   Sodium 130* 128* 131*   Potassium 3.9 4.6 5.0   Chloride 81* 82* 85*   CO2 29* 30* 29*   UN 29* 46* 29*   Creatinine 7.75* 11.01* 8.63*   GFR,Caucasian 6* 4* 6*   GFR,Black 7* 5* 6*   Glucose 96 143* 111*   Calcium 9.7 9.2 9.7      Recent Labs      01/24/18   0445   AST  18   ALT  15   Bilirubin,Total  1.2          Recent Labs  Lab 01/24/18  0933 01/23/18  2140 01/23/18  1938 01/23/18  1421 01/23/18  0745 01/22/18  2136 01/22/18  1857 01/22/18  1423   Glucose POCT 120* 96 108* 139* 144* 155* 117* 153*       A/P: Mr Stuhr is a 72 yr old male with PMH s/f ESRD on HD, HTN, DM2, prostate ca s/p TURP, familial polyposis s/p colectomy/ Koch pouch, GERD c/b Barrett esophagus who presented with severe hyperkalemia in the setting of missed hemodialysis. Hospital course complicated by opthalmic zoster.     1. Presumed herpes zoster ophthalmicus: Appreciate ophthalmology following.   Continue warm compresses to region for comfort. Erythromycin ointment to eyelids and forehead. Valtrex TID. Remains on airborne precautions.     2. ESRD on HD: Appreciate nephrology following. HMD MWF. Low K diet. Continue veltessa.   Continue nephrovite, sevelamer with meals.    Volume restriction for hyponatremia.     3. DM2:Continue Lantus 4u nightly and ISS with 4u baseline.     4.Glaucoma:  Continue gtts.    5. GERD/Barrett esophagus:  Continue PPI.    6. FAP s/p colectomy: Appreciate CRS, WOCN assistance.    7. Possible cognitive decline:Appreciate PRIME following.   Started on modafinil. Continue trazodone with sleep. Continue B12 supplement.   Continue work with PT/OT    DVT prophylaxis: SCDs  Dispo: Complex discharge. He is from [facility]. Cleared PT. OT evaluation underway.   Appreciate SW and Home care following. Needs HD slot.      Arby Barrette, NP  12:53 PM  01/24/2018      Addendum 6/15 1726:  Ophthalmology  consult appreciated.   Re-visited Mr Wearing this evening, he was inquiring about length of time shingles lasts. We reviewed patterns vary by patient but the valacyclovir and compliance to treatment regimen will help. He reports a headache, just wants to sleep. Questions answered to his satisfaction.     Cain Saupe, DNP, RN,  FNP-C  Dept. Of Hospital Medicine  Pager [phone]      Scheduled Meds:   valACYclovir  500 mg Oral Daily    erythromycin   Left Eye 3 times per day    modafinil  100 mg Oral Daily    cyanocobalamin  1,000 mcg Oral Daily    epoetin alfa  5,000 Units Intravenous Once per day on Mon Wed Fri    bacitracin   Topical 2 times per day    patiromer  16.8 g Oral Daily    traZODone  25 mg Oral Nightly    insulin lispro  0-30 Units Subcutaneous TID WC    insulin glargine  4 Units Subcutaneous Nightly    dorzolamide  1 drop Left Eye 2 times per day    timolol  1 drop Left Eye 2 times per day    latanoprost  1 drop Left Eye Nightly    sevelamer  1.6 g Oral TID WC    b complex-vitamin c-folic acid  1 tablet Oral Daily    pantoprazole  40 mg Oral Daily       Prescriptions Prior to Admission   Medication Sig    sodium bicarbonate 650 MG tablet Take 1,300 mg by mouth 3 times daily    atorvastatin (LIPITOR) 10 MG tablet Take 10 mg by mouth daily     magnesium oxide (MAG-OX) 400 (241.3 MG) MG tablet Take 800 mg by mouth daily    sodium polystyrene (KAYEXALATE) 15 GM/60ML suspension Take 30 g by mouth three times a week   Take if directed by MD for elevated potassium.    gabapentin (NEURONTIN) 300 MG capsule Take 300 mg by mouth daily    pantoprazole (PROTONIX) 40 MG EC tablet Take 40 mg by mouth daily   Swallow whole. Do not crush, break, or chew.    sevelamer (RENVELA) 800 MG tablet Take 1,600 mg by mouth 3 times daily (with meals)   Swallow whole. Do not crush, break, or chew.    B Complex-C-Folic Acid (RENAL-VITE) 0.8 MG TABS Take 0.8 mg by mouth at bedtime    dorzolamide-timolol (COSOPT) 22.3-6.8 MG/ML ophthalmic solution Place 1 drop into the left eye 2 times daily    HYDROcodone-acetaminophen (NORCO) 5-325 MG per tablet Take 1 tablet by mouth every 8 hours as needed for Pain    insulin detemir (LEVEMIR) 100 UNIT/ML injection vial Inject 30 Units into the skin daily   Maximum  46  Units/day     insulin lispro 100 UNIT/ML injection vial Inject 4-6 Units into the skin 3 times daily (before meals)   Maximum 18  Units/day    latanoprost (XALATAN) 0.005 % ophthalmic solution Place 1 drop into the left eye nightly    multi-vitamin (MULTIVITAMIN) per tablet Take 1 tablet by mouth daily

## 2018-01-25 LAB — POCT GLUCOSE
Glucose POCT: 181 mg/dL — ABNORMAL HIGH (ref 60–99)
Glucose POCT: 228 mg/dL — ABNORMAL HIGH (ref 60–99)
Glucose POCT: 145 mg/dL — ABNORMAL HIGH (ref 60–99)
Glucose POCT: 115 mg/dL — ABNORMAL HIGH (ref 60–99)
Glucose POCT: 67 mg/dL (ref 60–99)

## 2018-01-25 MED ORDER — ARTIFICIAL TEARS (POLYVINYL ALCOHOL) 1.4 % OP SOLN *I*
1.0000 [drp] | OPHTHALMIC | Status: DC
Start: 2018-01-25 — End: 2018-01-28
  Administered 2018-01-25 – 2018-01-28 (×29): 1 [drp] via OPHTHALMIC
  Filled 2018-01-25: qty 15

## 2018-01-25 MED ORDER — TRIFLURIDINE 1 % OP SOLN *I*
1.0000 [drp] | OPHTHALMIC | Status: DC
Start: 2018-01-25 — End: 2018-01-28
  Administered 2018-01-25 – 2018-01-28 (×30): 1 [drp] via OPHTHALMIC
  Filled 2018-01-25: qty 7.5

## 2018-01-25 NOTE — Progress Notes (Signed)
Patient seen & chart reviewed  States headache is better  Vision ok  Vitals:    01/24/18 1437 01/24/18 2255 01/25/18 0614 01/25/18 1002   BP: 98/63 131/80 107/61    BP Location: Left arm Left arm Left arm    Pulse: 67 55 59    Resp: 18 16 16     Temp: 36.7 C (98.1 F) 36.7 C (98.1 F) 36.3 C (97.3 F)    TempSrc: Temporal Temporal Temporal    SpO2: 94% 99% 96%    Weight:    60.8 kg (134 lb 1.6 oz)   Height:           Exam:  General: disheveled in appearance  Lungs: clear  Heart: RRR  Abd: +BS soft NT ostomy intact  Ext: no edema        A/P: 72 year old male with PMH significant for CKD V on HD, FAP with ostomy [name] [phone]), type II DM (now well controlled), Barrett;s esophagus, HTN, prostate cancer with TURP presented with severe hyperkalemia- K 9.5 on admit with EKG changes- due to missed HD- now awaiting safe disposition-course complicated by herpes zoster ophthalmicus left eye and forehead     Herpes zoster opthamicus: on valacyclovir, as per optho- added viroptic, erythromycin, artificial tears     ESRD/hyperkalemia; HD tomorrow- on veltessa    Type II DM: will increase nutritional baseline to 5 units with each meal, lantus HS    ? Depression: on modafinil    DVT prophylaxis: SCD's as allergic to heparin    Dispo: needs SNF- he is declining- instructed to be OOB more and show [place] he can be indpendent    Darcell Yacoub, NP  8:25 PM  01/25/2018    Prescriptions Prior to Admission   Medication Sig    sodium bicarbonate 650 MG tablet Take 1,300 mg by mouth 3 times daily    atorvastatin (LIPITOR) 10 MG tablet Take 10 mg by mouth daily    magnesium oxide (MAG-OX) 400 (241.3 MG) MG tablet Take 800 mg by mouth daily    sodium polystyrene (KAYEXALATE) 15 GM/60ML suspension Take 30 g by mouth three times a week   Take if directed by MD for elevated potassium.    gabapentin (NEURONTIN) 300 MG capsule Take 300 mg by mouth daily    pantoprazole (PROTONIX) 40 MG EC tablet Take 40 mg by mouth daily   Swallow  whole. Do not crush, break, or chew.    sevelamer (RENVELA) 800 MG tablet Take 1,600 mg by mouth 3 times daily (with meals)   Swallow whole. Do not crush, break, or chew.    B Complex-C-Folic Acid (RENAL-VITE) 0.8 MG TABS Take 0.8 mg by mouth at bedtime    dorzolamide-timolol (COSOPT) 22.3-6.8 MG/ML ophthalmic solution Place 1 drop into the left eye 2 times daily    HYDROcodone-acetaminophen (NORCO) 5-325 MG per tablet Take 1 tablet by mouth every 8 hours as needed for Pain    insulin detemir (LEVEMIR) 100 UNIT/ML injection vial Inject 30 Units into the skin daily   Maximum  46  Units/day     insulin lispro 100 UNIT/ML injection vial Inject 4-6 Units into the skin 3 times daily (before meals)   Maximum 18  Units/day    latanoprost (XALATAN) 0.005 % ophthalmic solution Place 1 drop into the left eye nightly    multi-vitamin (MULTIVITAMIN) per tablet Take 1 tablet by mouth daily     Scheduled Meds:   trifluridine  1 drop Left  Eye Q2H (Scheduled)    (artificial tears) polyvinyl alcohol  1 drop Left Eye Q2H (Scheduled)    valACYclovir  500 mg Oral Daily    erythromycin   Left Eye 3 times per day    modafinil  100 mg Oral Daily    cyanocobalamin  1,000 mcg Oral Daily    epoetin alfa  5,000 Units Intravenous Once per day on Mon Wed Fri    bacitracin   Topical 2 times per day    patiromer  16.8 g Oral Daily    traZODone  25 mg Oral Nightly    insulin lispro  0-30 Units Subcutaneous TID WC    insulin glargine  4 Units Subcutaneous Nightly    dorzolamide  1 drop Left Eye 2 times per day    timolol  1 drop Left Eye 2 times per day    latanoprost  1 drop Left Eye Nightly    sevelamer  1.6 g Oral TID WC    b complex-vitamin c-folic acid  1 tablet Oral Daily    pantoprazole  40 mg Oral Daily

## 2018-01-25 NOTE — Consults (Signed)
Brief Ophthalmology note:    Pt w delirium not answering appropriately and bizarre behavior eg answering w flatus.     Skin lesions improved and less erythematous.   K with persistent pseudodendrites. No haze.     Would highly recommend primary team in starting the treatment as outlined in prior note. The viroptic would strongly benefit this patient.     Per prior note:  - Valcyclovir 1000MG  PO TID for 7-10 days  -Viroptic gtts OS q2h   -warm compresses over lesions of face.  -Erythromycin TID in OS and over all facial / eyelid lesions.   -Derm consult for skin lesion of medial scalp.   - Artificial tears Q1-2HRS  - Pain control  - Consider medical evaluation for immunocompromise  - - Follow-up daily, will check on Monday.     Marye Round, MD   PGY-2 Ophthalmology  [facility]  Pager [phone]

## 2018-01-25 NOTE — Progress Notes (Signed)
Hospital Medicine Service Attending Progress Note    Significant Events/Subjective:   No acute events overnight     Patient lying in bed, reports vision is improved. Noted breakfast tray at bedside but patient had eaten very little food. Encouraged patient to ambulate.    Objective:     Physical Exam  Temp:  [36.3 C (97.3 F)-36.7 C (98.1 F)] 36.3 C (97.3 F)  Heart Rate:  [55-59] 59  Resp:  [16] 16  BP: (107-131)/(61-80) 107/61  60.8 kg (134 lb 1.6 oz)    General: NAD, alert  Resp: CTAB  CVS: S1, S2, no murmurs  ABdomen: soft, nontender, BS +  Skin: vesicular lesions along V1 distribution- improved  Ext: No edema     Recent Lab Studies:  Personally reviewed and notable for:    Recent Labs  Lab 01/23/18  0737 01/19/18  0157   WBC 6.2 6.4   Hemoglobin 8.9* 9.3*   Hematocrit 26* 28*   Platelets 122* 125*        Recent Labs  Lab 01/24/18  0445 01/23/18  0737 01/20/18  0559   Sodium 130* 128* 131*   Potassium 3.9 4.6 5.0   Chloride 81* 82* 85*   CO2 29* 30* 29*   UN 29* 46* 29*   Creatinine 7.75* 11.01* 8.63*   Glucose 96 143* 111*   Calcium 9.7 9.2 9.7   Albumin 4.5  --   --    Phosphorus  --  5.7*  --      No results for input(s): INR, PTT, PTI in the last 168 hours.    No components found with this basename: APTT        Current Inpatient Medications:   trifluridine  1 drop Left Eye Q2H (Scheduled)    (artificial tears) polyvinyl alcohol  1 drop Left Eye Q2H (Scheduled)    valACYclovir  500 mg Oral Daily    erythromycin   Left Eye 3 times per day    modafinil  100 mg Oral Daily    cyanocobalamin  1,000 mcg Oral Daily    epoetin alfa  5,000 Units Intravenous Once per day on Mon Wed Fri    bacitracin   Topical 2 times per day    patiromer  16.8 g Oral Daily    traZODone  25 mg Oral Nightly    insulin lispro  0-30 Units Subcutaneous TID WC    insulin glargine  4 Units Subcutaneous Nightly    dorzolamide  1 drop Left Eye 2 times per day    timolol  1 drop Left Eye 2 times per day    latanoprost  1  drop Left Eye Nightly    sevelamer  1.6 g Oral TID WC    b complex-vitamin c-folic acid  1 tablet Oral Daily    pantoprazole  40 mg Oral Daily     melatonin, acetaminophen, Nursing communication- Give 4 OZ of fruit juice for BG < 70 mg/dl **AND** dextrose **AND** dextrose **AND** glucagon **AND** POCT glucose    Assessment:   72 y.o. male with a history of ESRD on HD, HTN, DM2, prostate ca s/p TURP, FAP s/p subtotal colectomy, GERD c/b Barrett esophagus who presented with severe hyperkalemia in the setting of missed hemodialysis.    Plans:     ESRD/hyperkalemia:  -in the setting of noncompliance to diet and HD sessions  - Fistulagram with mostly successful angioplasty although unable to open area with extrinsic compression.  - Continue low K  diet.  Limit K intake from patient's snacks which he eats in addition to food from kitchen.  - Continue Veltassa.  - Appreciate nephrology assistance.    Herpes zoster ophthalmicus (V1)  - appreciate opthalmology input  - Valcyclovir TID x 7-10 days.  - continue topical erythromycin ointment, warm compresses and viroptic drops.     HTN:  - By history.  Patient with intermittent hypotension with HD.  Would not plan to start BP meds.    DM2:  - BGs extremely variable due to variable PO intake.  - Continue Lantus 4u nightly.  On 35u Levemir at home.  - Continue Humalog TID AC with 4u baseline.  On 12u baseline at home.  - Concern for excessively strict glycemic control at home likely causing hypoglycemia.  A1c in March of 5.6.    Possible cognitive decline:  - B12 and TSH wnl.  - Holding gabapentin.  - PT/OT recommending 24 hour supervision/assistance.  Patient's family is unable to provide.  Patient is refusing placement.  However, his capacity to make this decision is unclear as he does not really engage in discussion.  - PRIME consult.    Glaucoma:  - Continue gtts.    GERD/Barrett esophagus:  - Continue PPI.    FAP s/p colectomy:  - Patient having pain with  catheterization of Kock pouch.  CRS advising possible conversion to end ileostomy which patient is considering.  - Appreciate CRS, WOCN assistance.    Discharge Planning:   Est. Discharge Date (EDD): likely medically ready ~6/17  Discharge Criteria/Barriers to Discharge: herpes zoster, possible placement (patient refuses but with unclear capacity) vs home with 24hr assistance (does not appear available/possible), local HD slot  PT and/or OT Recommendations/Discharge to: home with 24hr supervision/assist vs SNF although patient refusing these options  Appointments Needed with: TBD     [facility], MBBS on 01/25/2018 at 3:19 PM

## 2018-01-25 NOTE — Plan of Care (Signed)
Cognitive function     Cognitive function will be maintained or return to baseline Maintaining        Mobility     Patient's functional status is maintained or improved Maintaining        Nutrition     Patient's nutritional status is maintained or improved Maintaining        Pain/Comfort     Patient's pain or discomfort is manageable Maintaining        Psychosocial     Demonstrates ability to cope with illness Maintaining        Safety     Patient will remain free of falls Maintaining        Assumed care of patient at 1900. Patient is AOx3. Patient denies pain. Will continue to monitor.   Corie Chiquito, RN

## 2018-01-26 ENCOUNTER — Inpatient Hospital Stay: Payer: Medicare (Managed Care)

## 2018-01-26 DIAGNOSIS — T82868A Thrombosis of vascular prosthetic devices, implants and grafts, initial encounter: Secondary | ICD-10-CM

## 2018-01-26 DIAGNOSIS — H539 Unspecified visual disturbance: Secondary | ICD-10-CM

## 2018-01-26 DIAGNOSIS — H401123 Primary open-angle glaucoma, left eye, severe stage: Secondary | ICD-10-CM

## 2018-01-26 DIAGNOSIS — Z4901 Encounter for fitting and adjustment of extracorporeal dialysis catheter: Secondary | ICD-10-CM

## 2018-01-26 HISTORY — PX: IR ANGIOGRAPHY AV DIALYSIS SHUNT: IMG18000

## 2018-01-26 LAB — BASIC METABOLIC PANEL
Anion Gap: 24 — ABNORMAL HIGH (ref 7–16)
Anion Gap: 23 — ABNORMAL HIGH (ref 7–16)
CO2: 24 mmol/L (ref 20–28)
CO2: 27 mmol/L (ref 20–28)
Calcium: 9.8 mg/dL (ref 8.6–10.2)
Calcium: 10.2 mg/dL (ref 8.6–10.2)
Chloride: 75 mmol/L — CL (ref 96–108)
Chloride: 75 mmol/L — CL (ref 96–108)
Creatinine: 13.81 mg/dL — ABNORMAL HIGH (ref 0.67–1.17)
Creatinine: 14.15 mg/dL — ABNORMAL HIGH (ref 0.67–1.17)
GFR,Black: 4 * — AB
GFR,Black: 4 * — AB
GFR,Caucasian: 3 * — AB
GFR,Caucasian: 3 * — AB
Glucose: 147 mg/dL — ABNORMAL HIGH (ref 60–99)
Glucose: 69 mg/dL (ref 60–99)
Lab: 62 mg/dL — ABNORMAL HIGH (ref 6–20)
Lab: 63 mg/dL — ABNORMAL HIGH (ref 6–20)
Potassium: 4.3 mmol/L (ref 3.3–5.1)
Potassium: 4.6 mmol/L (ref 3.3–5.1)
Sodium: 123 mmol/L — ABNORMAL LOW (ref 133–145)
Sodium: 125 mmol/L — ABNORMAL LOW (ref 133–145)

## 2018-01-26 LAB — CBC
Hematocrit: 29 % — ABNORMAL LOW (ref 40–51)
Hematocrit: 32 % — ABNORMAL LOW (ref 40–51)
Hemoglobin: 10 g/dL — ABNORMAL LOW (ref 13.7–17.5)
Hemoglobin: 11.3 g/dL — ABNORMAL LOW (ref 13.7–17.5)
MCH: 30 pg/cell (ref 26–32)
MCH: 31 pg/cell (ref 26–32)
MCHC: 35 g/dL (ref 32–37)
MCHC: 35 g/dL (ref 32–37)
MCV: 85 fL (ref 79–92)
MCV: 88 fL (ref 79–92)
Platelets: 200 10*3/uL (ref 150–330)
Platelets: 210 10*3/uL (ref 150–330)
RBC: 3.4 MIL/uL — ABNORMAL LOW (ref 4.6–6.1)
RBC: 3.7 MIL/uL — ABNORMAL LOW (ref 4.6–6.1)
RDW: 14.6 % — ABNORMAL HIGH (ref 11.6–14.4)
RDW: 14.8 % — ABNORMAL HIGH (ref 11.6–14.4)
WBC: 8.4 10*3/uL (ref 4.2–9.1)
WBC: 9.1 10*3/uL (ref 4.2–9.1)

## 2018-01-26 LAB — POCT GLUCOSE
Glucose POCT: 161 mg/dL — ABNORMAL HIGH (ref 60–99)
Glucose POCT: 110 mg/dL — ABNORMAL HIGH (ref 60–99)
Glucose POCT: 128 mg/dL — ABNORMAL HIGH (ref 60–99)
Glucose POCT: 158 mg/dL — ABNORMAL HIGH (ref 60–99)

## 2018-01-26 MED ORDER — ALTEPLASE 2 MG IJ SOLR *I*
INTRAMUSCULAR | Status: AC
Start: 2018-01-26 — End: 2018-01-26
  Filled 2018-01-26: qty 2

## 2018-01-26 MED ORDER — ALTEPLASE 2 MG IJ SOLR *I*
INTRAMUSCULAR | Status: AC | PRN
Start: 2018-01-26 — End: 2018-01-26
  Administered 2018-01-26: 1.9 mL

## 2018-01-26 MED ORDER — INSULIN LISPRO (HUMAN) 100 UNIT/ML IJ/SC SOLN *WRAPPED*
0.0000 [IU] | Freq: Three times a day (TID) | SUBCUTANEOUS | Status: DC
Start: 2018-01-26 — End: 2018-01-27
  Administered 2018-01-26: 4 [IU] via SUBCUTANEOUS
  Administered 2018-01-26: 2 [IU] via SUBCUTANEOUS

## 2018-01-26 MED ORDER — HALOPERIDOL LACTATE 5 MG/ML IJ SOLN *I*
0.5000 mg | Freq: Once | INTRAMUSCULAR | Status: AC
Start: 2018-01-27 — End: 2018-01-26
  Administered 2018-01-26: 0.5 mg via INTRAVENOUS
  Filled 2018-01-26: qty 1

## 2018-01-26 MED ORDER — FENTANYL CITRATE 50 MCG/ML IJ SOLN *WRAPPED*
INTRAMUSCULAR | Status: AC
Start: 2018-01-26 — End: 2018-01-26
  Filled 2018-01-26: qty 2

## 2018-01-26 MED ORDER — STERILE WATER FOR INJECTION IJ SOLN *I*
INTRAMUSCULAR | Status: AC
Start: 2018-01-26 — End: 2018-01-26
  Filled 2018-01-26: qty 20

## 2018-01-26 MED ORDER — FENTANYL CITRATE 50 MCG/ML IJ SOLN *WRAPPED*
INTRAMUSCULAR | Status: AC | PRN
Start: 2018-01-26 — End: 2018-01-26
  Administered 2018-01-26 (×2): 50 ug via INTRAVENOUS

## 2018-01-26 MED ORDER — MIDAZOLAM HCL 1 MG/ML IJ SOLN *I* WRAPPED
INTRAMUSCULAR | Status: AC
Start: 2018-01-26 — End: 2018-01-26
  Filled 2018-01-26: qty 2

## 2018-01-26 MED ORDER — HEPARIN SODIUM (PORCINE) 1000 UNIT/ML IJ SOLN *WRAPPED*
Status: AC
Start: 2018-01-26 — End: 2018-01-26
  Filled 2018-01-26: qty 10

## 2018-01-26 MED ORDER — LIDOCAINE HCL 1 % IJ SOLN *I*
INTRAMUSCULAR | Status: AC
Start: 2018-01-26 — End: 2018-01-26
  Filled 2018-01-26: qty 20

## 2018-01-26 MED ORDER — MIDAZOLAM HCL 1 MG/ML IJ SOLN *I* WRAPPED
INTRAMUSCULAR | Status: AC | PRN
Start: 2018-01-26 — End: 2018-01-26
  Administered 2018-01-26: 1 mg via INTRAVENOUS

## 2018-01-26 NOTE — Plan of Care (Signed)
Mobility     Patient's functional status is maintained or improved Maintaining        Pain/Comfort     Patient's pain or discomfort is manageable Maintaining          Cognitive function     Cognitive function will be maintained or return to baseline Progressing towards goal        Nutrition     Patient's nutritional status is maintained or improved Progressing towards goal        Safety     Patient will remain free of falls Progressing towards goal        Patient got confused overnight. Remained free of a fall thus far. Turns self in bed. No pain noted at this time. Patient currently not eating much. Will continue to monitor.

## 2018-01-26 NOTE — Interval H&P Note (Signed)
UPDATES TO PATIENT'S CONDITION on the DAY OF SURGERY/PROCEDURE    I. Updates to Patient's Condition (to be completed by a provider privileged to complete a H&P, following reassessment of the patient by the provider):    Day of Surgery/Procedure Update:  History  (Inpatients only): I confirm that progress notes within the past 24 hours document updates to the patient's condition.    Physical  (Inpatients only): I confirm that progress notes within the past 24 hours document updates to the patient's condition.            II. Procedure Readiness   I have reviewed the patient's H&P and updated condition. By completing and signing this form, I attest that this patient is ready for surgery/procedure.    III. Attestation   I have reviewed the updated information regarding the patient's condition and it is appropriate to proceed with the planned surgery/procedure.    Carmin Richmond, MD as of 5:07 PM 01/26/2018

## 2018-01-26 NOTE — Procedures (Addendum)
Procedure Report           Time out documentation completed prior to procedure:  Yes    Indications/Pre-Procedure diagnosis:  ESRD; malfunctioning RUE AV graft    Procedure performed: 1) RUE fistulogram and declotting. 2) L IJ tunneled dialysis catheter placement     Guide Wire Removed:Yes    Findings/Procedure Summary (detailed report located in the Image tab):   - successful RUE fistulogram demonstrating multiple venous outflow segments of recurrent moderate stenosis with complete thrombosis of the outflow.  - subsequent plasty of the entire venous outflow with 10x40 mm high pressure conquest balloon followed by ush thrombectomy of the thrombosed segments using the same ballon however with persistence of thrombosis.  - given the rapid recurrence of the stenotic segments (last performed on 01/13/18) and failed declotting, surgical consultation is recommended.  - subsequent successful placement of L IJ 14.5 Fr dual lumen 23 cm tunneled dialysis catheter. Ready for immediate use.   - the RUE sheath site purse string suture can/ be cut after 24 hrs.    Complications:  none    Condition:  good    EBL: minimal    Specimens:  N/A    Operators: Dr. Carmin Richmond and Dr Alphia Moh    Disposition:  floor    Post-Procedure Diagnosis: same    Alphia Moh, MD  01/26/2018  8:46 PM

## 2018-01-26 NOTE — Progress Notes (Addendum)
[employer] Nursing Procedure Note    Victor Flowers  [id number]    Procedure: Fistulagram and possible HD line placement        Unable to recanulate RUE fistula in IR. L IJ HD line placed under flouro. Primed with 1.50ml cathflo (alteplase) d/t heparin allergy per pharmacy [facility]).       Status: Completed    Patient tolerated procedure well     Specimen Collection: no    Sponge count: N/A    Fluid Removed:N/A  Procedure Dressing Site located:Right Arm and Left IJ  Dressing Type:Dermabond and sterile gauze/tegaderm  Biopatch:yes  Dressing status:Clean, dry and intact   Hematoma:Not evident  Medication received:Versed 1mg , Fentanyl 170mcg and 1.9 alteplase each lumen  Cardiovascular:   Peripheral Pulses: Present post procedure      Fistula: Thrill and bruit present    Neuro Assessment:N/A    Implant patient information given to patient or parent/guardian:N/A    Report given to: unit Graham. RN      Last Filed Vitals    01/26/18 1927   BP:    Pulse: 70   Resp: 15   Temp:    SpO2:

## 2018-01-26 NOTE — Invasive Procedure Plan of Care (Signed)
Invasive Procedure Plan of Care (Consent Form 419):   Condition(s) Addressed: Need for moderate sedation during procedure   Performing Provider: Christean Leaf, and Vascular and Interventional Radiology Attendings, Fellows, Residents and Advanced Practice Providers     Side: Not applicable    Procedure: Establishment of moderate sedation   Special Equipment: none   Planned Anesthesia: Moderate Sedation   Benefits: State of altered consciousness created by administration of intravenous medications (medications in an IV). Patients under moderate sedation will feel sleepy/relaxed, but will respond to commands. The purpose of moderate sedation is to make you feel comfortable during your procedure, and can make your procedure easier to perform.   Risks: Excessive sedation, which may require special procedures to keep you safe.  Nausea, vomiting, problems with heart rate or blood pressure, breathing difficilties, very rarely death.   Alternatives: Not undergoing procedure, undergoing procedure without sedation.   Expected Length of Stay:  day(s)     I, or a designated member of my surgical team, have discussed the planned procedure, including the potential for any transfusion of blood products or receipt of tissue as necessary, expected benefits, the potential complications and risks and possible alternatives and their benefits and risks with the patient or the patient's surrogate. In my opinion, the patent or the patient's surrogate understands the proposed procedure, its risks, benefits, and alternatives.    Electronically signed by Elonda Husky, DO at 4:47 PM     Patient Consent:  I hereby give my consent and authorize CANTOS, Marca Ancona, and Vascular and Interventional Radiology Attendings, Fellows, Residents and Advanced Practice Providers    (The list of possible assistants, all of whom are privileged to provide surgical services at the hospital, is available)  To treat the following: Need for moderate  sedation during procedure  Procedure includes: Establishment of moderate sedation  Laterality: Not applicable  1 The care provider has explained my condition to me, the benefits of having the above treatment procedure, and alternate ways of treating my condition. I understand that no guarantees have been made to me about the result of the treatment. The alternatives to this procedure include: Not undergoing procedure, undergoing procedure without sedation.   2 The care provider has discussed with me the reasonably foreseeable risks of the treatment and that there may be undesirable results. The risks that are specifically related to this procedure include: Excessive sedation, which may require special procedures to keep you safe.  Nausea, vomiting, problems with heart rate or blood pressure, breathing difficilties, very rarely death.   3 I understand that during the treatment a condition may be discovered which was not known before the treatment started. Therefore, I authorize the care provider to perform any additional or different treatment which is thought necessary and available.   4 Any tissue, parts, or substances removed during the procedure may be retained or disposed of in accordance with customary scientific, educational and clinical practice.   5 Vendor information if appropriate: If a vendor representative is expected to be present during my procedure, it has been explained to me that the vendor representative works for (manufacturer of the device to be used) and that his/her role includes . I consent to the vendor representative's presence and involvement as described. If circumstances change and a decision is made during my procedure that a vendor representative's presence is needed, I will be notified of the above after my procedure is completed.  Equipment: none   6 If blood products are needed,  I would agree:    7 If tissue products are needed, I would agree:    8 Blood/Tissue use limitations and/or  exclusions:       I have carefully read and fully understand this informed consent form, and have had sufficient opportunity to discuss my condition and the above procedure(s) with the care provider and his/her associates, and all of my questions have been answered to my satisfaction. I understand that my surgeon/provider performing the procedure may not be physically present in the operating/procedure room the entire time that I am there. My surgeon/provider has answered my questions regarding this and how it may relate to my surgery/procedure. I agree to the Plan of Care as outlined above.                       Patient Signature   (or Parent/Legal Guardian if pt is unable to sign or is a minor)  Date/Time     Electronic Signatures will display at the bottom of the consent form.

## 2018-01-26 NOTE — Progress Notes (Signed)
Per discussion with the team, requesting assistance from SW to schedule a family meeting with patient's daughter and sisters to discuss medical update and goals of care.  Writer spoke with Marzetta Board and she is available any afternoon around 3:00pm however she would like to include patient's sisters in the discussion.  Writer spoke with [name] and she advised that Wednesday at 3:00 would work for her but not sure if it will be a good time for [place].  Writer left message for Vaughan Basta and will coordinate a time for this week.    SW will continue to follow.    Darlyn Chamber, [place]

## 2018-01-26 NOTE — Progress Notes (Addendum)
Patient seen & chart reviewed  "please just let me go home- I need a pup"  Not answering other questions  Vitals:    01/25/18 1657 01/25/18 1724 01/26/18 0900 01/26/18 1531   BP: 100/58  (!) 88/45 91/52   BP Location: Left arm   Left arm   Pulse: 54  54 64   Resp: 16 16 18 16    Temp: 36.3 C (97.3 F)  36.5 C (97.7 F) 36.1 C (97 F)   TempSrc: Temporal   Temporal   SpO2: 100%   99%   Weight:       Height:           Exam:  General: frail weak, confused- picking at his colostomy  Lungs: clear  Heart: RRR  Abd: +BS soft NT, colostomy   Ext: no edema right AVF no bruit or thrill      Labs: reviewed      Recent Labs  Lab 01/26/18  1246 01/26/18  1004 01/23/18  0737   WBC 9.1 8.4 6.2   Hemoglobin 11.3* 10.0* 8.9*   Hematocrit 32* 29* 26*   Platelets 210 200 122*         Lab results: 01/26/18  1246 01/26/18  1004 01/24/18  0445   Sodium 125* 123* 130*   Potassium 4.6 4.3 3.9   Chloride 75* 75* 81*   CO2 27 24 29*   UN 63* 62* 29*   Creatinine 14.15* 13.81* 7.75*   GFR,Caucasian 3* 3* 6*   GFR,Black 4* 4* 7*   Glucose 69 147* 96   Calcium 10.2 9.8 9.7         Recent Labs  Lab 01/26/18  1453 01/26/18  1010 01/25/18  2259 01/25/18  2233 01/25/18  1920 01/25/18  1322 01/25/18  1002 01/24/18  2213   Glucose POCT 110* 161* 115* 104 145* 9* 181* 40*     72 year old male with PMH significant for 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, FAP with ostomy Victor Flowers [phone]), type II DM (now well controlled), Barrett;s esophagus, HTN, prostate cancer with TURP presented with severe hyperkalemia- K 9.5 on admit with EKG changes- due to missed HD- now awaiting safe disposition-course complicated by herpes zoster ophthalmicus left eye and forehead     Herpes zoster opthamicus: on valacyclovir renal dose, as per optho- added viroptic, erythromycin, artificial tears- per infection control no need for airborne precautions     ESRD/hyperkalemia; per IR to do fistulogram and place HD line today-    Hyponatremia; RF to 1 liter     Type II DM: decreased baseline 2  units- lantus HS    ? Depression: d/w Jodean Lima NP from psych he is agitated more than before- so will discontinue modafanil- started Friday    DVT prophylaxis: SCD's as allergic to heparin    Dispo: needs SNF-not medically ready-   To have family meeting to discuss [place] and direction for care  As patient not competent to make safe decisions for discharge    Victor Roorda, NP  4:85 PM  01/26/2018    Prescriptions Prior to Admission   Medication Sig    sodium bicarbonate 650 MG tablet Take 1,300 mg by mouth 3 times daily    atorvastatin (LIPITOR) 10 MG tablet Take 10 mg by mouth daily    magnesium oxide (MAG-OX) 400 (241.3 MG) MG tablet Take 800 mg by mouth daily    sodium polystyrene (KAYEXALATE) 15 GM/60ML suspension Take 30 g by mouth three  times a week   Take if directed by MD for elevated potassium.    gabapentin (NEURONTIN) 300 MG capsule Take 300 mg by mouth daily    pantoprazole (PROTONIX) 40 MG EC tablet Take 40 mg by mouth daily   Swallow whole. Do not crush, break, or chew.    sevelamer (RENVELA) 800 MG tablet Take 1,600 mg by mouth 3 times daily (with meals)   Swallow whole. Do not crush, break, or chew.    B Complex-C-Folic Acid (RENAL-VITE) 0.8 MG TABS Take 0.8 mg by mouth at bedtime    dorzolamide-timolol (COSOPT) 22.3-6.8 MG/ML ophthalmic solution Place 1 drop into the left eye 2 times daily    HYDROcodone-acetaminophen (NORCO) 5-325 MG per tablet Take 1 tablet by mouth every 8 hours as needed for Pain    insulin detemir (LEVEMIR) 100 UNIT/ML injection vial Inject 30 Units into the skin daily   Maximum  46  Units/day     insulin lispro 100 UNIT/ML injection vial Inject 4-6 Units into the skin 3 times daily (before meals)   Maximum 18  Units/day    latanoprost (XALATAN) 0.005 % ophthalmic solution Place 1 drop into the left eye nightly    multi-vitamin (MULTIVITAMIN) per tablet Take 1 tablet by mouth daily     Scheduled Meds:   insulin lispro  0-30 Units Subcutaneous TID WC     trifluridine  1 drop Left Eye Q2H (Scheduled)    (artificial tears) polyvinyl alcohol  1 drop Left Eye Q2H (Scheduled)    valACYclovir  500 mg Oral Daily    erythromycin   Left Eye 3 times per day    cyanocobalamin  1,000 mcg Oral Daily    epoetin alfa  5,000 Units Intravenous Once per day on Mon Wed Fri    bacitracin   Topical 2 times per day    patiromer  16.8 g Oral Daily    traZODone  25 mg Oral Nightly    insulin glargine  4 Units Subcutaneous Nightly    dorzolamide  1 drop Left Eye 2 times per day    timolol  1 drop Left Eye 2 times per day    latanoprost  1 drop Left Eye Nightly    sevelamer  1.6 g Oral TID WC    b complex-vitamin c-folic acid  1 tablet Oral Daily    pantoprazole  40 mg Oral Daily

## 2018-01-26 NOTE — Progress Notes (Addendum)
DIALYSIS NOTE:    Seen and examined at HD  trouble with RUE AVG  Will abort tx and plan fistulogram with plasty of likely stenosis      Vitals:    01/25/18 1724   BP:    Pulse:    Resp: 16   Temp:    Weight:    Height:      General: Alert, pleasant  Chest: CTABL, normal excursion  CVS: S1 + S2 , could not hear any murmur  Legs: no edema, no rash  Neuro: Moving all limbs, alert  LEFT UE AVG- no bruit    Labs: reviewed  .  A/P:    ESRD patients (MWF) presented with hyperkalemia after missing HD and now with headache.      Suspect AVG is thrombosed  Would schedule fistulogram in IR today- would alos ask that perm HD cath be placed as well . The prognosis of patency for AVG is not great  Await labs from today    Volume: EDW is 63 kg. UF as tolerted  BP: in 120's  Anemia: Hgb is 9.3 g/dL, On EPO  MBD: P Continue Renvela  Access: Left ram AVF s/p angioplasty during this admission   Continue Patiromer for now.  URR may not be accurate as HD was stopped early.  Water restriction to 1 L as he is becoming hyponatremic.  Low K diet  Avoid daily blood draws if possible  Renal dose meds

## 2018-01-26 NOTE — Invasive Procedure Plan of Care (Signed)
Invasive Procedure Plan of Care (Consent Form 419):   Condition(s) Addressed: Venous access   Performing Provider: Christean Leaf, and Vascular and Interventional Radiology Attendings, Fellows, Residents and Advanced Practice Providers     Side:    Procedure: A tunneled central venous catheter will be placed if access is needed for HD   Special Equipment:    Planned Anesthesia: Pending   Benefits: Establish secure central venous access.   Risks: Allergic reaction to dye, bleeding, shock, infection, collapse of lung, possible need for chest tube placement, catheter malposition, thrombus, mechanical phlebitis, and in very rare circumstances death.   Alternatives: Not to perform the procedure   Expected Length of Stay: 0 day(s)     I, or a designated member of my surgical team, have discussed the planned procedure, including the potential for any transfusion of blood products or receipt of tissue as necessary, expected benefits, the potential complications and risks and possible alternatives and their benefits and risks with the patient or the patient's surrogate. In my opinion, the patent or the patient's surrogate understands the proposed procedure, its risks, benefits, and alternatives.    Electronically signed by Elonda Husky, DO at 4:47 PM     Patient Consent:  I hereby give my consent and authorize CANTOS, Marca Ancona, and Vascular and Interventional Radiology Attendings, Fellows, Residents and Advanced Practice Providers    (The list of possible assistants, all of whom are privileged to provide surgical services at the hospital, is available)  To treat the following: Venous access  Procedure includes: A tunneled central venous catheter will be placed if access is needed for HD  Laterality:   1 The care provider has explained my condition to me, the benefits of having the above treatment procedure, and alternate ways of treating my condition. I understand that no guarantees have been made to me about the result  of the treatment. The alternatives to this procedure include: Not to perform the procedure   2 The care provider has discussed with me the reasonably foreseeable risks of the treatment and that there may be undesirable results. The risks that are specifically related to this procedure include: Allergic reaction to dye, bleeding, shock, infection, collapse of lung, possible need for chest tube placement, catheter malposition, thrombus, mechanical phlebitis, and in very rare circumstances death.   3 I understand that during the treatment a condition may be discovered which was not known before the treatment started. Therefore, I authorize the care provider to perform any additional or different treatment which is thought necessary and available.   4 Any tissue, parts, or substances removed during the procedure may be retained or disposed of in accordance with customary scientific, educational and clinical practice.   5 Vendor information if appropriate:    6 If blood products are needed, I would agree:    7 If tissue products are needed, I would agree:    8 Blood/Tissue use limitations and/or exclusions:       I have carefully read and fully understand this informed consent form, and have had sufficient opportunity to discuss my condition and the above procedure(s) with the care provider and his/her associates, and all of my questions have been answered to my satisfaction. I understand that my surgeon/provider performing the procedure may not be physically present in the operating/procedure room the entire time that I am there. My surgeon/provider has answered my questions regarding this and how it may relate to my surgery/procedure. I agree to the Plan  of Care as outlined above.                       Patient Signature   (or Parent/Legal Guardian if pt is unable to sign or is a minor)  Date/Time     Electronic Signatures will display at the bottom of the consent form.

## 2018-01-26 NOTE — Progress Notes (Signed)
Hospital Medicine Service Attending Progress Note    Significant Events/Subjective:   Patient reports improved vision in left eye.  He denies pain/pruritus of left eye/forehead.  He reports feeling tired.    Objective:     Physical Exam  Temp:  [36.3 C (97.3 F)] 36.3 C (97.3 F)  Heart Rate:  [54] 54  Resp:  [16] 16  BP: (100)/(58) 100/58  60.8 kg (134 lb 1.6 oz)    Constitutional: lying in bed, in no acute distress  HENT: anicteric, left eye without significant erythema or drainage  CV: RRR  Resp: normal work of breathing, on room air, lungs CTAB  GI: soft, non-tender, ostomy with significant leakage around bag  Skin/Lines: herpes zoster rash in V1 distribution, mostly crusted  Neuro: sleepy but arousable, reports he is "on my way to the green room" and cannot recall being in a hospital    Recent Lab, Micro, and Imaging Studies   Personally reviewed and notable for:    Na 123  K 4.3    BGs 100s-200s    Current Inpatient Medications:   insulin lispro  0-30 Units Subcutaneous TID WC    trifluridine  1 drop Left Eye Q2H (Scheduled)    (artificial tears) polyvinyl alcohol  1 drop Left Eye Q2H (Scheduled)    valACYclovir  500 mg Oral Daily    erythromycin   Left Eye 3 times per day    modafinil  100 mg Oral Daily    cyanocobalamin  1,000 mcg Oral Daily    epoetin alfa  5,000 Units Intravenous Once per day on Mon Wed Fri    bacitracin   Topical 2 times per day    patiromer  16.8 g Oral Daily    traZODone  25 mg Oral Nightly    insulin glargine  4 Units Subcutaneous Nightly    dorzolamide  1 drop Left Eye 2 times per day    timolol  1 drop Left Eye 2 times per day    latanoprost  1 drop Left Eye Nightly    sevelamer  1.6 g Oral TID WC    b complex-vitamin c-folic acid  1 tablet Oral Daily    pantoprazole  40 mg Oral Daily     melatonin, acetaminophen, Nursing communication- Give 4 OZ of fruit juice for BG < 70 mg/dl **AND** dextrose **AND** dextrose **AND** glucagon **AND** POCT  glucose    Assessment:     72 y.o. male with a history of ESRD on HD, HTN, DM2, prostate ca s/p TURP, FAP s/p subtotal colectomy, GERD c/b Barrett esophagus who presented with severe hyperkalemia in the setting of missed hemodialysis.    Plans:     ESRD/hyperkalemia/hyponatremia:  - Etiology of hyperkalemia appears missed HD and poor dietary adherence.  - K now improved with addition of Veltassa.    - Fistula appears non-functional today.  Plan for fistulagram later today.  Previously has AVF stenoses which were partially angioplastied on 6/4.  - Continue low K diet.  - Continue Veltassa.  - 1L fluid restriction when resuming diet after fistulagram given hyponatremia.  - Appreciate nephrology assistance.    Herpes zoster ophthalmicus:  - Appears to be improving.  - Continue valacyclovir, Viroptic, erythromycin ointment.  - Appreciate ophthalmology assistance.    HTN:  - By history.  He does not appear to be on antihypertensive meds.  Patient with intermittent hypotension with HD.  Would not plan to start BP meds.    DM2:  -  BGs extremely variable due to variable PO intake.  - Continue Lantus 4u nightly.  On 35u Levemir at home.  - Continue Humalog TID AC with 4u baseline.  On 12u baseline at home.  - Concern for excessively strict glycemic control at home likely causing hypoglycemia.  A1c in March of 5.6.    Possible cognitive decline:  - B12 and TSH wnl.  - Holding gabapentin.  - Patient currently appears encephalopathic and does not appear to have capacity for most medical decision making at this time.  - PT/OT recommending 24 hour supervision/assistance.  Patient's family is unable to provide.  - SW will contact family and attempt to arrange for family meeting.    Glaucoma:  - Continue gtts.    GERD/Barrett esophagus:  - Continue PPI.    FAP s/p colectomy:  - Patient having pain with catheterization of Kock pouch.  CRS advising possible conversion to end ileostomy which patient is considering.  - Appreciate CRS,  WOCN assistance.    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 Discharge Date (EDD): TBD  Discharge Criteria/Barriers to Discharge: herpes zoster, possible placement (patient refuses but currently does not appear to have capacity) vs home with 24hr assistance (does not appear available/possible)  PT and/or OT Recommendations/Discharge to: home with 24hr supervision/assist vs SNF although patient refusing these options  Appointments Needed with: TBD     Case discussed with APP, RN, SW, CC, nephrology attending.    Wynona Meals, MD on 01/26/2018 at 8:57 AM

## 2018-01-26 NOTE — Progress Notes (Addendum)
HMD APP XCOVER    Received page from [facility]- patient very confused, pulling at lines- pulled off HD dressing and attempted to tug at HD catheter, (got out of bed) sat self on floor and not getting back up. Hand mitts placed and Security called by Conservation officer, historic buildings. No obvious injuries.     Up to see patient. Confused, lying in bed- assisted back to bed by RN x5. Moving BUE spontaneously. Speech is comprehensible though did bring up that "he just wants to see his pup".     BG 158    A/p  Ongoing confusion in 72 yo male with PMH significant for FAP s/p ostomy, DM2, GERD/barretts, HTN, prostate CA s/p TURP, glaucoma, possible cognitive decline/depression, ruptured AAA s/p stent (2015), ESRD ( presumed PPI toxicity) on HD admitted 17 days ago for severe hyperkalemia (9.5) in setting of non compliance with HD schedule. iNitially, patient was admitted to MICU, received emergent HD. Hospitalization complicated by herpes zoster ophthalmicus of left eye + forehead. Per Dr. Rogelia Mire 6/2 note, sister Vaughan Basta has noted decline in memory + organizational skills over the past few years. Work up for reversible causes unrevealing.   - agitation improved s/p haldol 0.5mg  IVx1 (security did not have to assist)   - bed lowered, soft mats placed  - GPS now available  - continue with soft mitts to prevent pulling had HD line/ostomy  - appreciate RN staff assistance    FULL CODE  Esperanza Heir, [place]  Pager [phone]

## 2018-01-26 NOTE — H&P (View-Only) (Signed)
Inpatient Psychiatric Consultation Note   Initial Consult Note     Consult Requested by: Dr. Kerin Perna    Consult Question: depression    Admitting Diagnosis: ROMI, hyperkalemia, HTN    Chief Complaint: "I'm surviving."    Patient information was obtained from patient, medical record and treatment team. Notably, pt was seen by a member of the psychiatry service roughly 1 yr ago (03/25/17) when he was visiting his daughter Victor Flowers. At that time, he had shown up unannounced with the apparent goal of staying in [place] with daughter. See Victor Flowers's note from 03/25/17.    History/Exam limitations: marked psychomotor slowing & very limited verbal engagement    History of Present Illness:  Pt is a 72 y/o WM with ESRD on HD, CAD, HTN, DM2, prostate CA s/p TURP, FAP s/p subtotal colectomy and reported h/o depression (per daughter) who was admitted to [facility] as a STEMI alert on 01/09/18 (15d ago). He is a resident of [place] but was in town visiting family. He has been no more than tenuously involved in care, often disengaged on interview, and appears to require HLOC. Psychiatry is being consulted to evaluate for depression.    I evaluated Victor Flowers while he was receiving dialysis. He responded consistently to questions but his replies generally took 5-15 seconds and were mostly no more than a few words in length. He didn't provide any information spontaneously, and he did not provide narrative responses to open-ended questions. He is gross oriented to place, knows he's been in the hospital for "weeks" (he selected this out of several options) but was inconsistent regarding how long he had been on HD (at one point he said "years" and then later suggested that he had just started HD on admission [perhaps he meant that he had 'restarted' it as he had missed HD prior to [place]).    He describes things as "shitty." He reports general malaise/lethargy, poor sleep, back pain due to arthritis "in my back and into my neck,"  and emotional frustration. He also alludes to be lonely in the hospital and says that his family has not come to visit him--family includes sister, daughter, and grandchildren. He admits he would like to get better, feel better, and "go back home." However, he is unable to have a meaningful conversation on where home is, what concerns there might be regarding his ability to provide self-care, and the fact that he doesn't have a current HD slot in [place].    Social/Developmental History:  From [place]. Would like to live in the [place] area.    Mental Status Exam  Mental Status Exam  Appearance: Disheveled  Relationship to Interviewer:  (tenuously cooperative; long latency of responses)  Psychomotor Activity: Decreased  Abnormal Movements: None  Speech :  (slowed; prolonged latency)  Language: Fluent  Mood:  ("surviving")  Affect:  (blunted; occasionally irritable)  Thought Process: Ruminative (concrete; generally goal-directed)  Thought Content: No homicidal ideation, No delusions, No obsessions/compulsions  Perceptions/Associations : No hallucinations  Sensorium: Drowsy (grossly oriented)  Cognition:  (impaired memory & attention)  Insight : Poor  Judgement: Fair    Psychiatric History: (attempted to obtain but pt not forthcoming)  Current Psych Care: no  Past Psych Care: may have had (unclear)  Previous physical/sexual abuse: unknown  Previous psychiatric hospitalizations: none known  Previous suicide attempts: no  Substance use history and treatment:  unknown  Family psychiatric history:  unknown    Past Medical History:   Diagnosis Date  Barrett's esophagus     BPH (benign prostatic hyperplasia)     ESRD needing dialysis     FAP (familial adenomatous polyposis)     HLD (hyperlipidemia)     HTN (hypertension)      Past Surgical History:   Procedure Laterality Date    IR ANGIOGRAPHY AV DIALYSIS SHUNT N/A 01/13/2018    IR ANGIOGRAPHY AV DIALYSIS SHUNT 01/13/2018 Alcide Clever, MD [facility] IMG IR/ANG    PR  TEMPORAL ARTERY LIGATN OR BX N/A 03/28/2017    Procedure: TEMPORAL ARTERY BIOPSY;  Surgeon: Anselmo Rod, MD;  Location: [facility] MAIN OR;  Service: Ophthalmology     History reviewed. No pertinent family history.  Social History     Social History    Marital status: Divorced     Spouse name: N/A    Number of children: N/A    Years of education: N/A     Social History Main Topics    Smoking status: Former Smoker    Smokeless tobacco: Never Used    Alcohol use No    Drug use: No    Sexual activity: Not Asked     Other Topics Concern    None     Social History Narrative    None       Allergies:   Allergies   Allergen Reactions    Metformin Diarrhea    Bactrim [Sulfamethoxazole-Trimethoprim] Other (See Comments)     weakness    Heparin Other (See Comments)     Thrombocytopenia. Unclear severity. Per chart review, had a positive SRA    Lisinopril Other (See Comments)     Weakness, renal function changes       Medications:  Prescriptions Prior to Admission   Medication Sig    sodium bicarbonate 650 MG tablet Take 1,300 mg by mouth 3 times daily    atorvastatin (LIPITOR) 10 MG tablet Take 10 mg by mouth daily    magnesium oxide (MAG-OX) 400 (241.3 MG) MG tablet Take 800 mg by mouth daily    sodium polystyrene (KAYEXALATE) 15 GM/60ML suspension Take 30 g by mouth three times a week   Take if directed by MD for elevated potassium.    gabapentin (NEURONTIN) 300 MG capsule Take 300 mg by mouth daily    pantoprazole (PROTONIX) 40 MG EC tablet Take 40 mg by mouth daily   Swallow whole. Do not crush, break, or chew.    sevelamer (RENVELA) 800 MG tablet Take 1,600 mg by mouth 3 times daily (with meals)   Swallow whole. Do not crush, break, or chew.    B Complex-C-Folic Acid (RENAL-VITE) 0.8 MG TABS Take 0.8 mg by mouth at bedtime    dorzolamide-timolol (COSOPT) 22.3-6.8 MG/ML ophthalmic solution Place 1 drop into the left eye 2 times daily    HYDROcodone-acetaminophen (NORCO) 5-325 MG per tablet Take 1  tablet by mouth every 8 hours as needed for Pain    insulin detemir (LEVEMIR) 100 UNIT/ML injection vial Inject 30 Units into the skin daily   Maximum  46  Units/day     insulin lispro 100 UNIT/ML injection vial Inject 4-6 Units into the skin 3 times daily (before meals)   Maximum 18  Units/day    latanoprost (XALATAN) 0.005 % ophthalmic solution Place 1 drop into the left eye nightly    multi-vitamin (MULTIVITAMIN) per tablet Take 1 tablet by mouth daily     Current Facility-Administered Medications   Medication Dose Route Frequency    epoetin alfa (EPOGEN,PROCRIT)  injection 5,000 Units  5,000 Units Intravenous Once per day on Mon Wed Fri    bacitracin ointment   Topical 2 times per day    patiromer (VELTASSA) 16.8 g packet 16.8 g  16.8 g Oral Daily    traZODone (DESYREL) tablet 25 mg  25 mg Oral Nightly    insulin lispro (HumaLOG,ADMELOG) injection 0-30 Units  0-30 Units Subcutaneous TID WC    melatonin tablet 3 mg  3 mg Oral QHS PRN    insulin glargine (LANTUS) injection 4 Units  4 Units Subcutaneous Nightly    acetaminophen (TYLENOL) tablet 650 mg  650 mg Oral Q6H PRN    dextrose (GLUTOSE) 40 % oral gel 15 g  15 g Oral PRN    And    dextrose 50% (0.5 g/mL) injection 25 g  25 g Intravenous PRN    And    glucagon (GLUCAGEN) injection 1 mg  1 mg Intramuscular PRN    dorzolamide (TRUSOPT) 2 % ophthalmic solution 1 drop  1 drop Left Eye 2 times per day    timolol (TIMOPTIC) 0.25 % ophthalmic solution 1 drop  1 drop Left Eye 2 times per day    latanoprost (XALATAN) 0.005 % ophthalmic solution 1 drop  1 drop Left Eye Nightly    sevelamer (RENVELA) packet 1.6 g  1.6 g Oral TID WC    b complex-vitamin c-folic acid (NEPHRO-VITE) tablet 1 tablet  1 tablet Oral Daily    pantoprazole (PROTONIX) EC tablet 40 mg  40 mg Oral Daily       Review of Systems:  Expectations for ROS: Pertinent: 1    Extended: 2-9     Complete: 10+  Review of Systems   Constitutional: Positive for activity change, chills and  fatigue. Negative for appetite change.   Skin: Positive for rash.        Forehead itching   Psychiatric/Behavioral: Positive for decreased concentration, dysphoric mood and sleep disturbance.   All other systems reviewed and are negative.    Labs   All labs in the last 24 hours:   Recent Results (from the past 24 hour(s))   POCT glucose    Collection Time: 01/22/18  2:23 PM   Result Value Ref Range    Glucose POCT 153 (H) 60 - 99 mg/dL   POCT glucose    Collection Time: 01/22/18  6:57 PM   Result Value Ref Range    Glucose POCT 117 (H) 60 - 99 mg/dL   POCT glucose    Collection Time: 01/22/18  9:36 PM   Result Value Ref Range    Glucose POCT 155 (H) 60 - 99 mg/dL   Basic metabolic panel    Collection Time: 01/23/18  7:37 AM   Result Value Ref Range    Glucose 143 (H) 60 - 99 mg/dL    Sodium 128 (L) 133 - 145 mmol/L    Potassium 4.6 3.3 - 5.1 mmol/L    Chloride 82 (L) 96 - 108 mmol/L    CO2 30 (H) 20 - 28 mmol/L    Anion Gap 16 7 - 16    UN 46 (H) 6 - 20 mg/dL    Creatinine 11.01 (H) 0.67 - 1.17 mg/dL    GFR,Caucasian 4 (!) *    GFR,Black 5 (!) *    Calcium 9.2 8.6 - 10.2 mg/dL   Phosphorus    Collection Time: 01/23/18  7:37 AM   Result Value Ref Range    Phosphorus 5.7 (  H) 2.7 - 4.5 mg/dL   CBC    Collection Time: 01/23/18  7:37 AM   Result Value Ref Range    WBC 6.2 4.2 - 9.1 THOU/uL    RBC 2.9 (L) 4.6 - 6.1 MIL/uL    Hemoglobin 8.9 (L) 13.7 - 17.5 g/dL    Hematocrit 26 (L) 40 - 51 %    MCV 89 79 - 92 fL    MCH 30 26 - 32 pg/cell    MCHC 34 32 - 37 g/dL    RDW 15.0 (H) 11.6 - 14.4 %    Platelets 122 (L) 150 - 330 THOU/uL   POCT glucose    Collection Time: 01/23/18  7:45 AM   Result Value Ref Range    Glucose POCT 144 (H) 60 - 99 mg/dL       Vitals  BP: (!) 106/45  Temp: 35.9 C (96.6 F)  Temp src: Temporal  Heart Rate: 79 (via pulse ox)  Resp: 16  SpO2: 100 %  Height: 182.9 cm (6')  Weight: 64.7 kg (142 lb 10.2 oz) (bedscale)     TSH (01/11/18): 0.46  B12 (01/12/18): 312  Albumin (01/11/18): 4.1  ESR (01/19/18):  45    Formulation / Differential Diagnosis:  Pt is a 72 y/o WM with ESRD on HD, CAD, HTN, DM2, prostate CA s/p TURP, FAP s/p subtotal colectomy and reported h/o depression (per daughter) who was admitted to [facility] as a STEMI alert on 01/09/18 (15d ago). Current labs suggest that he is generally unwell. Whereas he is encephalopathic with reduced level of arousal (which extends beyond just generic somnolence), it's interesting that he is largely able to sustain attention when he attempts to do so. Although he seemed to be emotionally distant initially, he did appear to warm up with time. For instance, when I asked if he wanted me to end the interview and come back later he said, "no, it's okay." He appears to be struggling to make sense of his current physical, medical and functional limitations, which is made all the more difficult by irritable mood. For the time being, I would focus on symptomatic management of sleep/wake and mood. Itching on forehead is also a source of distress.    - Recommend checking methylmalonic acid to confirm functional deficiency and also beginning empirical treatment for B12 deficiency while awaiting result (312 on admission)  - Consider nutrition consult  - Would evaluate erythematous pruritic papular rash on forehead  - Check LFTs including ammonia, syphilis, HIV  - Could consider offering modafinil 100 mg qAM through the weekend to see if he engages more with care    Author: Wayland Salinas, MD  as of: 01/23/2018  at: 12:39 PM

## 2018-01-26 NOTE — Progress Notes (Signed)
01/26/18 unable to dialyze patient d/t thrombosed AVG. Pt will be reassessed for dialysis post fistulagram. Labs drawn and sent @10  am.  C.Mineola Duan RN

## 2018-01-26 NOTE — Invasive Procedure Plan of Care (Signed)
Invasive Procedure Plan of Care (Consent Form 419):   Condition(s) Addressed: Narrowed or blocked blood vessels/Aneurysm   Performing Provider: Christean Leaf, and Vascular and Interventional Radiology Attendings, Fellows, Residents and Advanced Practice Providers     Side: Not applicable    Procedure: Image guided evaluation of blood vessels. Vessel dilation, stenting, blocking off of vessels and dissolving blood clots as needed   Special Equipment: none   Planned Anesthesia: Pending   Benefits: Assessment of fistula patency, therapy if necessary   Risks: Bleeding, infection, shock, stroke, allergic reaction to contrast dye, injury to blood vessels and nerves, and in very rare circumstances death.   Alternatives: Not to perform the procedure   Expected Length of Stay: 0 day(s)     I, or a designated member of my surgical team, have discussed the planned procedure, including the potential for any transfusion of blood products or receipt of tissue as necessary, expected benefits, the potential complications and risks and possible alternatives and their benefits and risks with the patient or the patient's surrogate. In my opinion, the patent or the patient's surrogate understands the proposed procedure, its risks, benefits, and alternatives.    Electronically signed by Elonda Husky, DO at 4:44 PM     Patient Consent:  I hereby give my consent and authorize CANTOS, Marca Ancona, and Vascular and Interventional Radiology Attendings, Fellows, Residents and Advanced Practice Providers    (The list of possible assistants, all of whom are privileged to provide surgical services at the hospital, is available)  To treat the following: Narrowed or blocked blood vessels/Aneurysm  Procedure includes: Image guided evaluation of blood vessels. Vessel dilation, stenting, blocking off of vessels and dissolving blood clots as needed  Laterality: Not applicable  1 The care provider has explained my condition to me, the benefits of  having the above treatment procedure, and alternate ways of treating my condition. I understand that no guarantees have been made to me about the result of the treatment. The alternatives to this procedure include: Not to perform the procedure   2 The care provider has discussed with me the reasonably foreseeable risks of the treatment and that there may be undesirable results. The risks that are specifically related to this procedure include: Bleeding, infection, shock, stroke, allergic reaction to contrast dye, injury to blood vessels and nerves, and in very rare circumstances death.   3 I understand that during the treatment a condition may be discovered which was not known before the treatment started. Therefore, I authorize the care provider to perform any additional or different treatment which is thought necessary and available.   4 Any tissue, parts, or substances removed during the procedure may be retained or disposed of in accordance with customary scientific, educational and clinical practice.   5 Vendor information if appropriate: If a vendor representative is expected to be present during my procedure, it has been explained to me that the vendor representative works for (manufacturer of the device to be used) and that his/her role includes . I consent to the vendor representative's presence and involvement as described. If circumstances change and a decision is made during my procedure that a vendor representative's presence is needed, I will be notified of the above after my procedure is completed.  Equipment: none   6 If blood products are needed, I would agree:    7 If tissue products are needed, I would agree:    8 Blood/Tissue use limitations and/or exclusions:  I have carefully read and fully understand this informed consent form, and have had sufficient opportunity to discuss my condition and the above procedure(s) with the care provider and his/her associates, and all of my questions have  been answered to my satisfaction. I understand that my surgeon/provider performing the procedure may not be physically present in the operating/procedure room the entire time that I am there. My surgeon/provider has answered my questions regarding this and how it may relate to my surgery/procedure. I agree to the Plan of Care as outlined above.                       Patient Signature   (or Parent/Legal Guardian if pt is unable to sign or is a minor)  Date/Time     Electronic Signatures will display at the bottom of the consent form.

## 2018-01-26 NOTE — Progress Notes (Signed)
Interventional Radiology Pre-Procedure Handoff/Checklist    NPO: [x] YES [] NO [] N/A     Tube feed Stopped: [] YES [] NO [x] N/A     Anticoagulantsnone    Telemetry: [] YES [x] NO [] N/A     Transport mode: Technical brewer of [employer] needed: 1        IV access:      Respiratory: Room Air    Consentable:yes    Alert and Oriented to person, place and time?: yes    Code Status:full     Does patient wear an insulin pump? [] YES [] NO [x] N/A     Precautions:contact    Allergies:   Allergies   Allergen Reactions    Metformin Diarrhea    Bactrim [Sulfamethoxazole-Trimethoprim] Other (See Comments)     weakness    Heparin Other (See Comments)     Thrombocytopenia. Unclear severity. Per chart review, had a positive SRA    Lisinopril Other (See Comments)     Weakness, renal function changes       Mouhamed Glassco, RN Received Handoff Report from Maurisa Tesmer, RN for IR procedure 11:44 AM

## 2018-01-26 NOTE — Consults (Addendum)
Ophthalmology Consult      Patient name: Victor Flowers  DOB: 09-15-1945       Age: 72 y.o.  MR#: [id number]    Subjective:     Reason for Consult: VZV ophthalmicus    Consulting service: medicine    Pt with previous blurry vision, hx of glaucoma, presented this time for VZV, significant scalp lesions and blurry vision.     Past Ocular History: Uncertain history of pthisis OD. PCIOL OS. Prior Tab 2018.       Current Facility-Administered Medications:     insulin lispro (HumaLOG,ADMELOG) injection 0-30 Units, 0-30 Units, Subcutaneous, TID WC, Terboss, Maryalice, NP, 4 Units at 01/26/18 1044    trifluridine (VIROPTIC) 1 % ophthalmic solution 1 drop, 1 drop, Left Eye, Q2H (Scheduled), Terboss, Maryalice, NP, 1 drop at 81/19/14 1444    artificial tears (polyvinyl alcohol) ophthalmic solution 1.4%, 1 drop, Left Eye, Q2H (Scheduled), Terboss, Maryalice, NP, 1 drop at 78/29/56 1444    valACYclovir (VALTREX) tablet 500 mg, 500 mg, Oral, Daily, Terboss, Maryalice, NP, 213 mg at 08/65/78 1014    erythromycin (ILOTYCIN) ophthalmic ointment, , Left Eye, 3 times per day, Terboss, Maryalice, NP    cyanocobalamin (Vitamin B-12) tablet 1,000 mcg, 1,000 mcg, Oral, Daily, Terboss, Maryalice, NP, 4,696 mcg at 01/26/18 1014    epoetin alfa (EPOGEN,PROCRIT) injection 5,000 Units, 5,000 Units, Intravenous, Once per day on Mon Wed Fri, Gross, Carlise E, NP, 5,000 Units at 01/23/18 1136    bacitracin ointment, , Topical, 2 times per day, Graciela Husbands, NP    patiromer (VELTASSA) 16.8 g packet 16.8 g, 16.8 g, Oral, Daily, Cappiello, Cynthia, NP, 16.8 g at 01/25/18 1620    traZODone (DESYREL) tablet 25 mg, 25 mg, Oral, Nightly, Cappiello, Cynthia, NP, 25 mg at 01/25/18 2237    melatonin tablet 3 mg, 3 mg, Oral, QHS PRN, Prinzing, Mary, PA, 3 mg at 01/24/18 2254    insulin glargine (LANTUS) injection 4 Units, 4 Units, Subcutaneous, Nightly, Prinzing, [place], [place], 4 Units at 01/25/18 2321    acetaminophen (TYLENOL) tablet  650 mg, 650 mg, Oral, Q6H PRN, Terboss, Maryalice, NP, 295 mg at 28/41/32 1324    Nursing communication- Give 4 OZ of fruit juice for BG < 70 mg/dl, , , PRN **AND** dextrose (GLUTOSE) 40 % oral gel 15 g, 15 g, Oral, PRN **AND** dextrose 50% (0.5 g/mL) injection 25 g, 25 g, Intravenous, PRN, 25 g at 01/12/18 1250 **AND** glucagon (GLUCAGEN) injection 1 mg, 1 mg, Intramuscular, PRN **AND** POCT glucose, , , 4x Daily AC & HS, Boehly, Clearnce Sorrel, NP    dorzolamide (TRUSOPT) 2 % ophthalmic solution 1 drop, 1 drop, Left Eye, 2 times per day, Doyle Askew, NP, 1 drop at 01/26/18 1016    timolol (TIMOPTIC) 0.25 % ophthalmic solution 1 drop, 1 drop, Left Eye, 2 times per day, Doyle Askew, NP, 1 drop at 01/26/18 1017    latanoprost (XALATAN) 0.005 % ophthalmic solution 1 drop, 1 drop, Left Eye, Nightly, Doyle Askew, NP, 1 drop at 01/25/18 2300    sevelamer (RENVELA) packet 1.6 g, 1.6 g, Oral, TID WC, Liliana Cline, MD, 1.6 g at 01/25/18 0816    b complex-vitamin c-folic acid (NEPHRO-VITE) tablet 1 tablet, 1 tablet, Oral, Daily, Liliana Cline, MD, 1 tablet at 01/26/18 1014    pantoprazole (PROTONIX) EC tablet 40 mg, 40 mg, Oral, Daily, Liliana Cline, MD, 40 mg at 01/26/18 1014  Allergies: Metformin; Bactrim [sulfamethoxazole-trimethoprim]; Heparin; and  Lisinopril     Medical History:   Past Medical History:   Diagnosis Date    Barrett's esophagus     BPH (benign prostatic hyperplasia)     ESRD needing dialysis     FAP (familial adenomatous polyposis)     HLD (hyperlipidemia)     HTN (hypertension)         Surgical History:   Past Surgical History:   Procedure Laterality Date    IR ANGIOGRAPHY AV DIALYSIS SHUNT N/A 01/13/2018    IR ANGIOGRAPHY AV DIALYSIS SHUNT 01/13/2018 Alcide Clever, MD [facility] IMG IR/ANG    PR TEMPORAL ARTERY LIGATN OR BX N/A 03/28/2017    Procedure: TEMPORAL ARTERY BIOPSY;  Surgeon: Anselmo Rod, MD;  Location: [facility] MAIN OR;  Service:  Ophthalmology          Objective:     Base Eye Exam     Visual Acuity (Snellen - Linear)       Right Left    Dist sc LP     Near sc  20/800 but poor effort          Tonometry (Tonopen, 1:26 PM)       Right Left    Pressure 18 19          Pupils       React APD    Right      Left Brisk None          Visual Fields       Left Right    Restrictions  Total superior temporal, inferior temporal, superior nasal, inferior nasal deficiencies          Extraocular Movement       Right Left     Full Full          Neuro/Psych     Oriented x3:  Yes    Mood/Affect:  Normal          Dilation     Both eyes:  2.5% Phenylephrine, 1.0% Tropicamide @ 1:26 PM            Slit Lamp and Fundus Exam     External Exam       Right Left    External pthisical.  many crusted lesions V1 distribution involving nose. Large discolored lesion 4cm on scalp.          Slit Lamp Exam       Right Left    Lids/Lashes normal.  swollen with few crusted lid lesions    Conjunctiva/Sclera wnl.  trace injection    Cornea opaque Normal epithelium, stroma, endothelium, tear film    Anterior Chamber unable poor view.  deep    Iris  Normal shape, size, morphology    Lens  PCIOL    Vitreous  Clear          Fundus Exam       Right Left    Disc  pallor T>N    C/D Ratio Vertical  0.9    Macula  Normal, no retinal whitening.    Vessels  attenuation    Periphery  Normal limited due to pt coop. Previous exam noted superioral and nasal strophy                   Assessment/Plan:     Assessment      VZV ophthalmicus OS  Valcyclovir 1000MG  PO TID for 7-10 days -> renally dosed to 500 QD  -Viroptic gtts OS 5 times a day  for 7 days   -warm compresses over lesions of face.  -Erythromycin TID in OS and over all facial / eyelid lesions.   -Derm consult for skin lesion of medial scalp.   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 B6. and B12 for possible nutritional deficit        Patient seen and discussed with Dr. Phylliss Bob.    Authored by Erlinda Hong He, MD on 01/26/2018 at 4:46 PM     I have seen and examined the patient 6/17 and agree with residents assessment and plan as documented above.    Phylliss Bob, MD

## 2018-01-27 LAB — POCT GLUCOSE
Glucose POCT: 98 mg/dL (ref 60–99)
Glucose POCT: 137 mg/dL — ABNORMAL HIGH (ref 60–99)
Glucose POCT: 176 mg/dL — ABNORMAL HIGH (ref 60–99)
Glucose POCT: 107 mg/dL — ABNORMAL HIGH (ref 60–99)

## 2018-01-27 LAB — BASIC METABOLIC PANEL
Anion Gap: 29 — ABNORMAL HIGH (ref 7–16)
CO2: 20 mmol/L (ref 20–28)
Calcium: 9.6 mg/dL (ref 8.6–10.2)
Chloride: 74 mmol/L — CL (ref 96–108)
Creatinine: 15.67 mg/dL — ABNORMAL HIGH (ref 0.67–1.17)
GFR,Black: 3 * — AB
GFR,Caucasian: 3 * — AB
Glucose: 123 mg/dL — ABNORMAL HIGH (ref 60–99)
Lab: 74 mg/dL — ABNORMAL HIGH (ref 6–20)
Potassium: 4.1 mmol/L (ref 3.3–5.1)
Sodium: 123 mmol/L — ABNORMAL LOW (ref 133–145)

## 2018-01-27 LAB — CBC
Hematocrit: 30 % — ABNORMAL LOW (ref 40–51)
Hemoglobin: 10.6 g/dL — ABNORMAL LOW (ref 13.7–17.5)
MCH: 31 pg/cell (ref 26–32)
MCHC: 36 g/dL (ref 32–37)
MCV: 86 fL (ref 79–92)
Platelets: 180 10*3/uL (ref 150–330)
RBC: 3.5 MIL/uL — ABNORMAL LOW (ref 4.6–6.1)
RDW: 14.7 % — ABNORMAL HIGH (ref 11.6–14.4)
WBC: 8.8 10*3/uL (ref 4.2–9.1)

## 2018-01-27 LAB — HSV TYPE 1 DNA PCR: HSV type 1 DNA PCR: 0

## 2018-01-27 LAB — METHYLMALONIC ACID, SERUM: Methylmalonic Acid: 0.25 umol/L (ref 0.00–0.40)

## 2018-01-27 LAB — HSV TYPE 2 DNA PCR: HSV type 2 DNA PCR: 0

## 2018-01-27 LAB — VARICELLA-ZOSTER VIRUS DNA PCR: Varicella-Zoster Virus DNA PCR: POSITIVE — AB

## 2018-01-27 LAB — FOLATE: Folate: >14 ng/mL (ref >=4.6)

## 2018-01-27 MED ORDER — ALTEPLASE 2 MG IJ SOLR *I*
0.5000 mg | INTRAMUSCULAR | Status: DC | PRN
Start: 2018-01-27 — End: 2018-01-28
  Administered 2018-01-27 – 2018-01-28 (×2): 1.9 mg
  Filled 2018-01-27 (×2): qty 2

## 2018-01-27 MED ORDER — HALOPERIDOL LACTATE 5 MG/ML IJ SOLN *I*
0.5000 mg | Freq: Once | INTRAMUSCULAR | Status: AC
Start: 2018-01-27 — End: 2018-01-27
  Administered 2018-01-27: 0.5 mg via INTRAVENOUS
  Filled 2018-01-27: qty 1

## 2018-01-27 MED ORDER — ALTEPLASE 2 MG IJ SOLR *I*
0.5000 mg | INTRAMUSCULAR | Status: DC | PRN
Start: 2018-01-27 — End: 2018-01-28
  Administered 2018-01-27 – 2018-01-28 (×2): 1.9 mg
  Filled 2018-01-27: qty 2

## 2018-01-27 MED ORDER — INSULIN GLARGINE 100 UNIT/ML SC SOLN *WRAPPED*
2.0000 [IU] | Freq: Every evening | SUBCUTANEOUS | Status: DC
Start: 2018-01-27 — End: 2018-01-28
  Administered 2018-01-27: 2 [IU] via SUBCUTANEOUS

## 2018-01-27 MED ORDER — INSULIN LISPRO (HUMAN) 100 UNIT/ML IJ/SC SOLN *WRAPPED*
0.0000 [IU] | Freq: Three times a day (TID) | SUBCUTANEOUS | Status: DC
Start: 2018-01-27 — End: 2018-01-28
  Administered 2018-01-27: 2 [IU] via SUBCUTANEOUS

## 2018-01-27 NOTE — Progress Notes (Signed)
Patient interview -  [facility])  Interview was done with pts daughter Marzetta Board 01/20/18 (Late entry)    Home visit address/ contact phone confirmed: Pt will be staying at [place] (which is one of his daughter Adele Barthel home), Phone call daughter [phone]    Pets/ Smoking:Pt does have a dog, No Smoking. Daughter Marzetta Board aware of pet policy    Home set up/ lives with: At above home pt would be living by himself. (Daughter has concern with this, see writers note in erecord from 01/20/18 written by Probation officer)     ADL needs:  Pt would need assistance    Current DME or supplies in home:None    Medication management method:Daughter wasn't sure how pt had been managing his meds    Pneumococcal vaccine given and date.Tomasita Crumble           Influenza vaccine given and date.Marland Kitchen   Unk        Community or [facility] programs active with patient:  None. Pt will need HD slot set up prior to d/c. Pt will be new to [place]    Confirmed Attending MD ________________agrees to sign ____all home care orders or____only the following home care orders:  Confirmed Attending MD office address for home care orders as:_____________________     MD office Care Manager or clinical contact ...  * Pt does NOT have a local PCP, Floor is aware that Home care will need a Dr to sign interim orders until pt is set up with New PCP  Phone #....  Fax #.......Marland Kitchen    Insurance Information:  Patient/family informed of home care/ DME copays- Daughter Marzetta Board aware that she needs to call [place] to have insurance changed to [place], as its currently [place]  ___not applicable  ___ Yes    Patient has been screened for Medicare as a Secondary Payer ([place])  ___eligible for [facility]   ___not eligible    MVA or WC case  No    Clarice Pole RN for [name] RN [phone]  After Hours [phone] option 4  Weekends and [place]  [place] Coordinator

## 2018-01-27 NOTE — Progress Notes (Signed)
Report Given To  Shu, RN 6-12, 01/26/2018,   Patient tolerated HD with 550 ml of fluid removed. Left IJ HD cath used. Right AVG has no bruit or frill. Patient remained confused and a bit restless at times. 1:1 at bedside.      Descriptive Sentence / Reason for Admission   Completed 4 hours HD with soft BP.       Active Issues / Relevant Events   Epogen and TPA dwell for HD cath administered in HD. Please see MAR.        To Do List  Please weigh patient daily.   HD cath dressing due change: 02/02/2018      Anticipatory Guidance / Discharge Planning  Next HD schedule: 01/28/2018  Albesa Seen RN

## 2018-01-27 NOTE — Patient Instructions (Signed)
J cath used

## 2018-01-27 NOTE — Progress Notes (Addendum)
DIALYSIS NOTE:    Seen and examined at HD  Now with new left IJ perm HD cath'  Fistulogram/plasty of RUE AVG not successful- AVG is thrombosed and lost forever  He is comfortable and tolerating 1.5 liter UF goal      Vitals:    01/27/18 0830   BP: 123/59   Pulse: 66   Resp: 16   Temp:    Weight:    Height:      General: Alert, pleasant  Chest: CTABL, normal excursion  CVS: S1 + S2 , could not hear any murmur  Legs: no edema, no rash  Neuro: Moving all limbs, alert  Right UE AVG- no bruit  Left IJ perm HD cath      Labs: reviewed  .  A/P:    ESRD patients (MWF) presented with hyperkalemia after missing HD and now with headache.    Now with new HD access- perm HD cath  No further plans for AV access during this admission  Plan next HD tomorrow-wednesday        Volume: EDW is 63 kg. UF as tolerted  BP: in 120's  Anemia: Hgb is 9.3 g/dL, On EPO  MBD: P Continue Renvela  Access: Left ram AVF s/p angioplasty during this admission   Continue Patiromer for now.  URR may not be accurate as HD was stopped early.  Water restriction to 1 L as he is becoming hyponatremic.  Low K diet  Avoid daily blood draws if possible  Renal dose meds

## 2018-01-27 NOTE — Progress Notes (Signed)
Hospital Medicine Service Attending Progress Note    Significant Events/Subjective:   Patient reports that he feels great today.  He is enjoying his breakfast.    Objective:     Physical Exam  Temp:  [36.1 C (97 F)-36.5 C (97.7 F)] 36.1 C (97 F)  Heart Rate:  [54-87] 70  Resp:  [10-22] 15  BP: (86-176)/(42-91) 134/58       Constitutional: lying in bed, in no acute distress  HENT: anicteric, left eye without significant erythema or drainage  CV: RRR  Resp: normal work of breathing, on room air, lungs CTAB  GI: soft, non-tender, ostomy  Skin/Lines: herpes zoster rash in V1 distribution, appears crusted over  Neuro: awake, alert but confused    Recent Lab, Micro, and Imaging Studies   Personally reviewed and notable for:    Na 123, stable  K 4.1    BGs 100s    Current Inpatient Medications:   insulin lispro  0-30 Units Subcutaneous TID WC    trifluridine  1 drop Left Eye Q2H (Scheduled)    (artificial tears) polyvinyl alcohol  1 drop Left Eye Q2H (Scheduled)    valACYclovir  500 mg Oral Daily    erythromycin   Left Eye 3 times per day    cyanocobalamin  1,000 mcg Oral Daily    epoetin alfa  5,000 Units Intravenous Once per day on Mon Wed Fri    bacitracin   Topical 2 times per day    patiromer  16.8 g Oral Daily    traZODone  25 mg Oral Nightly    insulin glargine  4 Units Subcutaneous Nightly    dorzolamide  1 drop Left Eye 2 times per day    timolol  1 drop Left Eye 2 times per day    latanoprost  1 drop Left Eye Nightly    sevelamer  1.6 g Oral TID WC    b complex-vitamin c-folic acid  1 tablet Oral Daily    pantoprazole  40 mg Oral Daily     alteplase, alteplase, melatonin, acetaminophen, Nursing communication- Give 4 OZ of fruit juice for BG < 70 mg/dl **AND** dextrose **AND** dextrose **AND** glucagon **AND** POCT glucose    Assessment:     72 y.o. male with a history of ESRD on HD, HTN, DM2, prostate ca s/p TURP, FAP s/p subtotal colectomy, GERD c/b Barrett esophagus who presented with  severe hyperkalemia in the setting of missed hemodialysis.    Plans:     ESRD/hyperkalemia/hyponatremia:  - Etiology of hyperkalemia appears missed HD and poor dietary adherence.  - K now improved with addition of Veltassa.    - Recurrently dysfunctional fistula.  Fistulagram yesterday with angioplasty and thrombectomy, however there was still residual thrombosis.  Had tunneled L IJ HD catheter placed.  - Continue low K diet.  - Continue Veltassa.  - 1L fluid restriction when resuming diet after fistulagram given hyponatremia.  - Appreciate nephrology assistance.    Herpes zoster ophthalmicus:  - Appears to be improving.  - Continue valacyclovir, Viroptic, erythromycin ointment.  - Appreciate ophthalmology assistance.    HTN:  - By history.  He does not appear to be on antihypertensive meds.  Patient with intermittent hypotension with HD.  Would not plan to start BP meds.    DM2:  - BGs extremely variable due to variable PO intake.  - Continue Lantus 4u nightly.  On 35u Levemir at home.  - Continue Humalog TID AC with 4u baseline.  On 12u baseline at home.  - Concern for excessively strict glycemic control at home likely causing hypoglycemia.  A1c in March of 5.6.    Encephalopathy/cognitive decline:  - Patient appears to have increased confusion/development of encephalopathy over the past few days.  - Due to encephalopathy the patient does not appear to have capacity for most medical decision making at this time.  - Patient has multiple factors which may be contributing to encephalopathy/delirium.  - Correction of hyponatremia as above.  - HD today should improve uremia.  - Treatment of zoster as above.  - Discontinue modafinil.  - B12 and TSH wnl.  - Holding gabapentin.  - PT/OT recommending 24 hour supervision/assistance.  Patient's family is unable to provide.  - SW arranging family meeting, likely tomorrow.    Glaucoma:  - Continue gtts.    GERD/Barrett esophagus:  - Continue PPI.    FAP s/p colectomy:  -  Patient having pain with catheterization of Kock pouch.  CRS advising possible conversion to end ileostomy which patient is considering.  - Appreciate CRS, WOCN assistance.    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 Discharge Date (EDD): TBD  Discharge Criteria/Barriers to Discharge: herpes zoster, possible placement (patient refuses but currently does not appear to have capacity) vs home with 24hr assistance (does not appear available/possible)  PT and/or OT Recommendations/Discharge to: home with 24hr supervision/assist vs SNF although patient refusing these options  Appointments Needed with: TBD     Case discussed with APP, RN, SW, CC, PRIME team.    Wynona Meals, MD on 01/27/2018 at 8:02 AM

## 2018-01-27 NOTE — Provider Consult (Signed)
Psychiatric Consultation Follow-up Note     Subjective/Objective: Patient seen today in his room on [address]. He was lying in bed, tossing and turning. He was receiving HD at the bed side and was accompanied by nursing and a 1:1. His level of arousal and attention fluctuated throughout out interaction. At times he would seem to be falling asleep and other times he would attempt to sit up or pull at his HD line. He reported that he was feeling "so much better" and would smile often at Probation officer. He could not tell Probation officer exactly how he felt better. He was oriented to [facility]" but believe it was November of 2012 and that he was in [place]. He could not sustain focus long enough to even register 3 objects, let alone recall them after short delay. He could not state the months of the year in reverse or forward order. At one point he responded "January, March, apple, tie, penny, 2012" - blending several of my questions together. He did not appear to be responding to internal stimuli was very clearly confused.     Mental Status Exam  Mental Status Exam  Appearance: Disheveled, Unkempt  Relationship to Interviewer: Cooperative, Eye contact limited  Psychomotor Activity: Increased  Abnormal Movements: None  Speech : Slowed  Language:  (a bit repetitive)  Mood: Patient quote: ("I feel much better")  Affect:  (blunted; occasionally irritable)  Thought Process: Ruminative, Disorganized  Thought Content: No suicidal ideation, No homicidal ideation, No delusions, No obsessions/compulsions  Perceptions/Associations : No hallucinations  Sensorium:  (waking and waning )  Cognition:  (impaired memory & attention)  Insight : Poor  Judgement: Inadequate      Most recent Vitals:  Patient Vitals for the past 24 hrs:   BP Temp Temp src Pulse Resp SpO2 Weight   01/27/18 1215 142/56 36.1 C (97 F) - 82 19 100 % 59.9 kg (132 lb 0.9 oz)   01/27/18 1145 97/58 - - 81 - - -   01/27/18 1130 100/57 - - 83 - - -   01/27/18 1100 (!)  88/46 - - 64 - - -   01/27/18 1030 103/56 - - 81 - - -   01/27/18 1000 (!) 91/46 - - 87 - - -   01/27/18 0930 102/65 - - 80 - - -   01/27/18 0900 97/58 - - 81 - - -   01/27/18 0830 123/59 - - 66 16 - -   01/27/18 0750 138/73 - - 63 16 98 % -   01/27/18 0740 154/76 35.6 C (96.1 F) - 70 16 98 % 60.4 kg (133 lb 2.5 oz)   01/26/18 1947 134/58 - - - - - -   01/26/18 1933 160/67 - - - - - -   01/26/18 1927 - - - 70 15 - -   01/26/18 1923 145/81 - - 85 16 - -   01/26/18 1917 136/65 - - 63 15 100 % -   01/26/18 1912 (!) 157/91 - - 75 17 99 % -   01/26/18 1907 160/65 - - 70 15 100 % -   01/26/18 1902 142/72 - - 74 19 99 % -   01/26/18 1857 158/69 - - 67 10 100 % -   01/26/18 1852 176/76 - - 70 13 - -   01/26/18 1847 155/66 - - 85 22 98 % -   01/26/18 1842 133/56 - - 80 17 90 % -   01/26/18 1837 128/89 - -  74 20 100 % -   01/26/18 1832 131/69 - - 68 16 100 % -   01/26/18 1827 100/67 - - 78 18 100 % -   01/26/18 1822 94/69 - - 68 11 100 % -   01/26/18 1817 100/71 - - 63 15 100 % -   01/26/18 1812 95/56 - - 70 13 100 % -   01/26/18 1807 (!) 86/66 - - 63 15 100 % -   01/26/18 1802 (!) 97/42 - - 67 19 100 % -   01/26/18 1757 129/76 - - 66 11 100 % -   01/26/18 1752 (!) 126/44 - - 67 22 100 % -   01/26/18 1747 128/64 - - 73 12 100 % -   01/26/18 1742 130/74 - - 81 18 100 % -   01/26/18 1737 130/68 - - 70 18 100 % -   01/26/18 1732 121/89 - - 87 14 100 % -   01/26/18 1727 126/73 - - 66 19 100 % -   01/26/18 1722 132/67 - - 69 17 96 % -   01/26/18 1531 91/52 36.1 C (97 F) TEMPORAL 64 16 99 % -        Pertinent Labs:   All labs in the last 24 hours:   Recent Results (from the past 24 hour(s))   Basic metabolic panel    Collection Time: 01/26/18 12:46 PM   Result Value Ref Range    Glucose 69 60 - 99 mg/dL    Sodium 125 (L) 133 - 145 mmol/L    Potassium 4.6 3.3 - 5.1 mmol/L    Chloride 75 (LL) 96 - 108 mmol/L    CO2 27 20 - 28 mmol/L    Anion Gap 23 (H) 7 - 16    UN 63 (H) 6 - 20 mg/dL    Creatinine 14.15 (H) 0.67 - 1.17 mg/dL     GFR,Caucasian 3 (!) *    GFR,Black 4 (!) *    Calcium 10.2 8.6 - 10.2 mg/dL   CBC    Collection Time: 01/26/18 12:46 PM   Result Value Ref Range    WBC 9.1 4.2 - 9.1 THOU/uL    RBC 3.7 (L) 4.6 - 6.1 MIL/uL    Hemoglobin 11.3 (L) 13.7 - 17.5 g/dL    Hematocrit 32 (L) 40 - 51 %    MCV 88 79 - 92 fL    MCH 31 26 - 32 pg/cell    MCHC 35 32 - 37 g/dL    RDW 14.8 (H) 11.6 - 14.4 %    Platelets 210 150 - 330 THOU/uL   POCT glucose    Collection Time: 01/26/18  2:53 PM   Result Value Ref Range    Glucose POCT 110 (H) 60 - 99 mg/dL   POCT glucose    Collection Time: 01/26/18  8:24 PM   Result Value Ref Range    Glucose POCT 128 (H) 60 - 99 mg/dL   POCT glucose    Collection Time: 01/26/18 10:56 PM   Result Value Ref Range    Glucose POCT 158 (H) 60 - 99 mg/dL   Basic metabolic panel    Collection Time: 01/27/18  6:33 AM   Result Value Ref Range    Glucose 123 (H) 60 - 99 mg/dL    Sodium 123 (L) 133 - 145 mmol/L    Potassium 4.1 3.3 - 5.1 mmol/L    Chloride 74 (LL) 96 - 108 mmol/L  CO2 20 20 - 28 mmol/L    Anion Gap 29 (H) 7 - 16    UN 74 (H) 6 - 20 mg/dL    Creatinine 15.67 (H) 0.67 - 1.17 mg/dL    GFR,Caucasian 3 (!) *    GFR,Black 3 (!) *    Calcium 9.6 8.6 - 10.2 mg/dL   CBC    Collection Time: 01/27/18  6:33 AM   Result Value Ref Range    WBC 8.8 4.2 - 9.1 THOU/uL    RBC 3.5 (L) 4.6 - 6.1 MIL/uL    Hemoglobin 10.6 (L) 13.7 - 17.5 g/dL    Hematocrit 30 (L) 40 - 51 %    MCV 86 79 - 92 fL    MCH 31 26 - 32 pg/cell    MCHC 36 32 - 37 g/dL    RDW 14.7 (H) 11.6 - 14.4 %    Platelets 180 150 - 330 THOU/uL       Impression  Victor Flowers is a 72 yo WM with ESRD on HD, CAD, HTN, DM2, prostate CA s/p TURP, FAP s/p subtotal colectomy and reported h/o depression (per daughter) who was admitted to [facility] as a STEMI alert on 01/09/18. Current labs suggest that he is generally unwell with significant electrolyte abnormalities. Last week when he was evaluated by Dr. Isa Rankin he appeared encephalopathic. He had reduced level of arousal at that time  and was started on modafinil. Over the weekend he became increasingly confused and disoriented. Last evening he was combative and was given two doses of IV haloperidol. Today his level of focus and attention continues to wax and wean and he is disoriented and illogical. He is calm but this is likely due haloperidol given overnight. He was receiving dialysis at the bed side, hopefully this can help to correct some of his electrolyte abnormalities.     Recommendation  - Agree with primary team's decision to discontinue modafinil for now. It appeared to be contributing to irritability and activation.   - Okay to continue using mitts at this time to keep patient from pulling at his HD access.   - If necessary, can use PO quetiapine 25-50 mg PRN for acute distress/aggression.   - Parenteral haloperidol should be limited to times when patient's behaviors place himself or others at imminent risk.       Author: Marton Redwood, NP  as of: 01/27/2018  at: 12:28 PM   Follow-up Consult Note

## 2018-01-27 NOTE — Progress Notes (Signed)
Writer received call from patient's sister, [name] confirming family meeting for tomorrow Wednesday June 19th at 3:00pm.  Probation officer notified provider team of family meeting time.    SW remains available to assist as needed.    Darlyn Chamber, [place]

## 2018-01-27 NOTE — Plan of Care (Signed)
Cognitive function     Cognitive function will be maintained or return to baseline Progressing towards goal        Mobility     Patient's functional status is maintained or improved Progressing towards goal        Nutrition     Patient's nutritional status is maintained or improved Progressing towards goal        Pain/Comfort     Patient's pain or discomfort is manageable Progressing towards goal        Psychosocial     Demonstrates ability to cope with illness Progressing towards goal        Safety     Patient will remain free of falls Progressing towards goal              Assumed care @ 1900. Pt oriented to self only. Around 10pm pt had increased confusion & agitation. Pt ripped new HD dressing off and kept tugging on catheter. Pt swinging arms and yelling at writer when trying to redirect and move hands away from catheter. Writer replaced dressing w/ help from other staff. Mitts placed for safety. Provider Esperanza Heir, PA paged. Writer attempted to give pt Tylenol, pt was uncooperative.     Around 2300 pt was sitting on the edge of the bed. Writer tried to redirect pt to lay back down, pt began to slide himself onto the ground. Writer & other RN's assisted pt while sliding down to the ground. Once on the ground pt refused to get up. Pt was swinging & yelling at RN's. RN's assisted pt to a standing position, pt refused to help and became heavy so was assisted back down to the ground. Public safety was called for assistance, and provider paged again. RN's attempted to stand pt again and got pt into bed. One time dose of haldol was given w/ minimal effect. GPS now @ bedside.     Around 0200 pt began to get agitated again. Provider paged and another one time dose of haldol was given.     Q2h eye drops given per MAR.       Will continue to monitor.       Kathreen Devoid, RN

## 2018-01-27 NOTE — Progress Notes (Signed)
Patient seen & chart reviewed events of overnight noted  Seen after HD.  Nonverbal but arousable today  Vitals:    01/27/18 1145 01/27/18 1215 01/27/18 1452 01/27/18 1630   BP: 97/58 142/56 (!) 100/46 111/59   BP Location:   Left arm    Pulse: 81 82 84 86   Resp:  19 18    Temp:  36.1 C (97 F) 36.3 C (97.3 F)    TempSrc:   Temporal    SpO2:  100% 97%    Weight:  59.9 kg (132 lb 0.9 oz)     Height:           Exam:  General: lethargic but arousable, left eye remains erythematous - with crusting on forehead  Lungs: clear  Heart: RRR  Abd: +BS soft NT colostomy intact + soft stool  Ext: no edema      Labs: reviewed      Recent Labs  Lab 01/27/18  0633 01/26/18  1246 01/26/18  1004   WBC 8.8 9.1 8.4   Hemoglobin 10.6* 11.3* 10.0*   Hematocrit 30* 32* 29*   Platelets 180 210 200         Lab results: 01/27/18  7846 01/26/18  1246 01/26/18  1004   Sodium 123* 125* 123*   Potassium 4.1 4.6 4.3   Chloride 74* 75* 75*   CO2 20 27 24    UN 74* 63* 62*   Creatinine 15.67* 14.15* 13.81*   GFR,Caucasian 3* 3* 3*   GFR,Black 3* 4* 4*   Glucose 123* 69 147*   Calcium 9.6 10.2 9.8         Recent Labs  Lab 01/27/18  1340 01/27/18  0957 01/26/18  2256 01/26/18  2024 01/26/18  1453 01/26/18  1010 01/25/18  2259 01/25/18  2233   Glucose POCT 39* 80 158* 128* 110* 161* 101* 44     72 year old male with PMH significant for CKD V on HD, FAP with ostomy Victor Flowers [phone]), type II DM (now well controlled), Barrett;s esophagus, HTN, prostate cancer with TURP presented with severe hyperkalemia- K 9.5 on admit with EKG changes- due to missed HD- now awaiting safe disposition-course complicated byherpes zoster ophthalmicus left eye and forehead     Herpes zoster opthamicus: on valacyclovir renal dose, as per optho- onviroptic, erythromycin, artificial tears- per infection control no need for airborne precautions    ESRD/hyperkalemia/ hyponatremia;had HD today, on veltassa, RF 1 liter daily    Acute encephalopathy/ depression ; ?  Hyponatremia playing a role, received haldol overnight- per psychiatry can use seroquel prn- and haldol only for imminent risk      Type II DM: decreased baseline 2 units yesterday will stop from today as BG's low and appetite poor- decrease lantus HS to 2 units       DVT prophylaxis: SCD's as allergic to heparin    Dispo: needs SNF-not medically ready-   To have family meeting tomorrow to discuss [place] and direction for care  As patient not competent to make safe decisions for discharge    Kazue Cerro, NP  2:44 PM  01/27/2018    Prescriptions Prior to Admission   Medication Sig    sodium bicarbonate 650 MG tablet Take 1,300 mg by mouth 3 times daily    atorvastatin (LIPITOR) 10 MG tablet Take 10 mg by mouth daily    magnesium oxide (MAG-OX) 400 (241.3 MG) MG tablet Take 800 mg by mouth daily  sodium polystyrene (KAYEXALATE) 15 GM/60ML suspension Take 30 g by mouth three times a week   Take if directed by MD for elevated potassium.    gabapentin (NEURONTIN) 300 MG capsule Take 300 mg by mouth daily    pantoprazole (PROTONIX) 40 MG EC tablet Take 40 mg by mouth daily   Swallow whole. Do not crush, break, or chew.    sevelamer (RENVELA) 800 MG tablet Take 1,600 mg by mouth 3 times daily (with meals)   Swallow whole. Do not crush, break, or chew.    B Complex-C-Folic Acid (RENAL-VITE) 0.8 MG TABS Take 0.8 mg by mouth at bedtime    dorzolamide-timolol (COSOPT) 22.3-6.8 MG/ML ophthalmic solution Place 1 drop into the left eye 2 times daily    HYDROcodone-acetaminophen (NORCO) 5-325 MG per tablet Take 1 tablet by mouth every 8 hours as needed for Pain    insulin detemir (LEVEMIR) 100 UNIT/ML injection vial Inject 30 Units into the skin daily   Maximum  46  Units/day     insulin lispro 100 UNIT/ML injection vial Inject 4-6 Units into the skin 3 times daily (before meals)   Maximum 18  Units/day    latanoprost (XALATAN) 0.005 % ophthalmic solution Place 1 drop into the left eye nightly     multi-vitamin (MULTIVITAMIN) per tablet Take 1 tablet by mouth daily     Scheduled Meds:   insulin lispro  0-30 Units Subcutaneous TID WC    trifluridine  1 drop Left Eye Q2H (Scheduled)    (artificial tears) polyvinyl alcohol  1 drop Left Eye Q2H (Scheduled)    valACYclovir  500 mg Oral Daily    erythromycin   Left Eye 3 times per day    cyanocobalamin  1,000 mcg Oral Daily    epoetin alfa  5,000 Units Intravenous Once per day on Mon Wed Fri    bacitracin   Topical 2 times per day    patiromer  16.8 g Oral Daily    traZODone  25 mg Oral Nightly    insulin glargine  4 Units Subcutaneous Nightly    dorzolamide  1 drop Left Eye 2 times per day    timolol  1 drop Left Eye 2 times per day    latanoprost  1 drop Left Eye Nightly    sevelamer  1.6 g Oral TID WC    b complex-vitamin c-folic acid  1 tablet Oral Daily    pantoprazole  40 mg Oral Daily

## 2018-01-28 ENCOUNTER — Encounter: Payer: Self-pay | Admitting: Radiology

## 2018-01-28 DIAGNOSIS — R630 Anorexia: Secondary | ICD-10-CM

## 2018-01-28 LAB — CBC
Hematocrit: 29 % — ABNORMAL LOW (ref 40–51)
Hemoglobin: 10.1 g/dL — ABNORMAL LOW (ref 13.7–17.5)
MCH: 31 pg/cell (ref 26–32)
MCHC: 35 g/dL (ref 32–37)
MCV: 89 fL (ref 79–92)
Platelets: 159 10*3/uL (ref 150–330)
RBC: 3.3 MIL/uL — ABNORMAL LOW (ref 4.6–6.1)
RDW: 15.2 % — ABNORMAL HIGH (ref 11.6–14.4)
WBC: 12.1 10*3/uL — ABNORMAL HIGH (ref 4.2–9.1)

## 2018-01-28 LAB — BASIC METABOLIC PANEL
Anion Gap: 19 — ABNORMAL HIGH (ref 7–16)
CO2: 27 mmol/L (ref 20–28)
Calcium: 9.7 mg/dL (ref 8.6–10.2)
Chloride: 84 mmol/L — ABNORMAL LOW (ref 96–108)
Creatinine: 10.43 mg/dL — ABNORMAL HIGH (ref 0.67–1.17)
GFR,Black: 5 * — AB
GFR,Caucasian: 4 * — AB
Glucose: 172 mg/dL — ABNORMAL HIGH (ref 60–99)
Lab: 35 mg/dL — ABNORMAL HIGH (ref 6–20)
Potassium: 4 mmol/L (ref 3.3–5.1)
Sodium: 130 mmol/L — ABNORMAL LOW (ref 133–145)

## 2018-01-28 LAB — PHOSPHORUS: Phosphorus: 7.6 mg/dL — ABNORMAL HIGH (ref 2.7–4.5)

## 2018-01-28 LAB — POCT GLUCOSE: Glucose POCT: 95 mg/dL (ref 60–99)

## 2018-01-28 MED ORDER — HALOPERIDOL LACTATE 5 MG/ML IJ SOLN *I*
1.0000 mg | INTRAMUSCULAR | Status: DC | PRN
Start: 2018-01-28 — End: 2018-01-28

## 2018-01-28 MED ORDER — HYDROMORPHONE HCL 1 MG/ML PO LIQD *I*
0.5000 mg | ORAL | Status: DC | PRN
Start: 2018-01-28 — End: 2018-01-28

## 2018-01-28 MED ORDER — GLYCOPYRROLATE 0.2 MG/ML IJ SOLN *WRAPPED*
0.4000 mg | INTRAMUSCULAR | Status: DC | PRN
Start: 2018-01-28 — End: 2018-01-31
  Filled 2018-01-28: qty 2

## 2018-01-28 MED ORDER — HALOPERIDOL LACTATE 2 MG/ML PO CONCENTRATED *I*
1.0000 mg | ORAL | Status: DC | PRN
Start: 2018-01-28 — End: 2018-01-29
  Administered 2018-01-28 – 2018-01-29 (×2): 1 mg via SUBLINGUAL
  Filled 2018-01-28: qty 5
  Filled 2018-01-28: qty 0.5
  Filled 2018-01-28: qty 5

## 2018-01-28 MED ORDER — LORAZEPAM 2 MG/ML IJ SOLN *I*
1.0000 mg | INTRAMUSCULAR | Status: DC | PRN
Start: 2018-01-28 — End: 2018-01-31
  Administered 2018-01-31: 1 mg via SUBCUTANEOUS
  Filled 2018-01-28: qty 1

## 2018-01-28 MED ORDER — HYDROMORPHONE HCL 1 MG/ML PO LIQD *I*
0.5000 mg | ORAL | Status: DC | PRN
Start: 2018-01-28 — End: 2018-01-29
  Administered 2018-01-28 – 2018-01-29 (×4): 0.5 mg via ORAL
  Filled 2018-01-28 (×4): qty 0.5

## 2018-01-28 MED ORDER — ARTIFICIAL TEARS (POLYVINYL ALCOHOL) 1.4 % OP SOLN *I*
1.0000 [drp] | OPHTHALMIC | Status: DC
Start: 2018-01-28 — End: 2018-01-31
  Administered 2018-01-28 – 2018-01-31 (×16): 1 [drp] via OPHTHALMIC

## 2018-01-28 MED ORDER — HALOPERIDOL LACTATE 5 MG/ML IJ SOLN *I*
1.0000 mg | INTRAMUSCULAR | Status: DC | PRN
Start: 2018-01-28 — End: 2018-01-31

## 2018-01-28 MED ORDER — ACETAMINOPHEN 325 MG PO TABS *I*
650.0000 mg | ORAL_TABLET | Freq: Four times a day (QID) | ORAL | Status: DC | PRN
Start: 2018-01-28 — End: 2018-01-31

## 2018-01-28 MED ORDER — BISACODYL 10 MG RE SUPP *I*
10.0000 mg | Freq: Every day | RECTAL | Status: DC | PRN
Start: 2018-01-28 — End: 2018-01-31

## 2018-01-28 MED ORDER — TRIFLURIDINE 1 % OP SOLN *I*
1.0000 [drp] | OPHTHALMIC | Status: DC
Start: 2018-01-28 — End: 2018-01-31
  Administered 2018-01-28 – 2018-01-31 (×16): 1 [drp] via OPHTHALMIC

## 2018-01-28 NOTE — Progress Notes (Signed)
Family meeting was held today to discuss [place] for the patient. Present at the meeting was the patients daughter and his two sisters. The family was in agreement for comfort care measures. The providers to update the MOLST.     Jeralene Peters, [place]

## 2018-01-28 NOTE — Progress Notes (Signed)
Pt was transferred to [phone] at 1800. All belongings and supplied packed with patient. Report given to [employer].   Budd Palmer, RN

## 2018-01-28 NOTE — Progress Notes (Signed)
Report Given To  01/28/2018    Pt tolerated 4hrs of HD Tx, -0L net fluid removal.   BP was low, provider was notified and instructed to [facility]  Carver Fila, RN          Descriptive Sentence / Reason for Admission   Duration of Treatment (minutes): 818 minutes  Complications: BP soft KWE given NSB at times to keep SBP >90.        Active Issues / Relevant Events   Review of medications administered  such as routine meds, antibiotics, and heparin: epogen and alteplase to both catheter limbs.   Vital Signs: BP soft         To Do List  Medications still needing administration: none HD related  Dressing change due :02/02/2018        Anticipatory Guidance / Discharge Planning  Date/Time of Next Treatment: 01/30/2018. Please obtain daily weights to monitor fluid volume.

## 2018-01-28 NOTE — Progress Notes (Signed)
Patient arrived to the unit at [facility] from 612 accompanied by Elmyra Ricks, RN, GPS [place], GPS [place]. Patient oriented to unit, call bell, and patient rights. Patient provided with admission packet. Patient is A&Ox1-3. Vital signs completed, see Flowsheet. Ambulatory status 1A. 4 eyed skin assessment completed with Edward Jolly, RN. Significant findings include blanchable redness on sacrum and wound on top of head. Belongings in admission navigator brought with 612 staff, kept at bedside.  Will continue to monitor.    Carmela Rima, RN

## 2018-01-28 NOTE — Progress Notes (Signed)
Hospital Medicine Service Attending Progress Note    Significant Events/Subjective:   Patient reports feeling awful.  However, he cannot/will not explain what is making him feel poorly, and he does not respond further to my questions.    Objective:     Physical Exam  Temp:  [36.1 C (97 F)-36.6 C (97.9 F)] 36.2 C (97.2 F)  Heart Rate:  [64-87] 73  Resp:  [18-19] 18  BP: (88-142)/(46-65) 98/60  59.9 kg (132 lb 0.9 oz)    Constitutional: lying in bed, in no acute distress  HENT: anicteric  CV: RRR  Resp: normal work of breathing, on room air, lungs CTAB  GI: soft, non-tender, ostomy  Skin/Lines: herpes zoster rash in V1 distribution, appears crusted over  Neuro: awake, alert but confused, largely not answering questions    Recent Lab, Micro, and Imaging Studies   Personally reviewed and notable for:    Na 130, up from 123  K 4    Current Inpatient Medications:   insulin glargine  2 Units Subcutaneous Nightly    insulin lispro  0-30 Units Subcutaneous TID WC    trifluridine  1 drop Left Eye Q2H (Scheduled)    (artificial tears) polyvinyl alcohol  1 drop Left Eye Q2H (Scheduled)    valACYclovir  500 mg Oral Daily    erythromycin   Left Eye 3 times per day    cyanocobalamin  1,000 mcg Oral Daily    epoetin alfa  5,000 Units Intravenous Once per day on Mon Wed Fri    bacitracin   Topical 2 times per day    patiromer  16.8 g Oral Daily    traZODone  25 mg Oral Nightly    dorzolamide  1 drop Left Eye 2 times per day    timolol  1 drop Left Eye 2 times per day    latanoprost  1 drop Left Eye Nightly    sevelamer  1.6 g Oral TID WC    b complex-vitamin c-folic acid  1 tablet Oral Daily    pantoprazole  40 mg Oral Daily     alteplase, alteplase, melatonin, acetaminophen, Nursing communication- Give 4 OZ of fruit juice for BG < 70 mg/dl **AND** dextrose **AND** dextrose **AND** glucagon **AND** POCT glucose    Assessment:     72 y.o. male with a history of ESRD on HD, HTN, DM2, prostate ca s/p TURP, FAP  s/p subtotal colectomy, GERD c/b Barrett esophagus who presented with severe hyperkalemia in the setting of missed hemodialysis.    Plans:     Goals of care:  - Met with patient's daughter and sisters today. After discussion of patient's condition and the extremely unlikely possibility that the patient would regain independence, they opted to pursue comfort measures only.  - MOLST completed, signed and placed in patient's chart.  - Will discontinue dialysis treatments.  - Will updated medications to reflect goals of care.  - Will consult palliative care service.  Patient's prognosis is days to <2 weeks and would be appropriate for inpatient end of life care.    ESRD:  - Discontinue HD, dietary restrictions, etc given goals of care.    Herpes zoster ophthalmicus:  - Continue valacyclovir, Viroptic, erythromycin ointment.    Glaucoma:  - Continue gtts.    DVT PPx: contraindicated by goals of care    Code Status: Full Code    Discharge Planning:   Est. Discharge Date (EDD): terminal admission    Case discussed with APP, SW.  Discussion with patient's family as above.    Wynona Meals, MD on 01/28/2018 at 8:53 AM

## 2018-01-28 NOTE — Progress Notes (Signed)
Writer received call from [facility] advising that patient has a confirmed chair at their location once medically ready for discharge.  Writer advised that patient is not medically ready and estimated discharge not known at this time.      SW will continue to follow.    Darlyn Chamber, [place]

## 2018-01-28 NOTE — Progress Notes (Signed)
DIALYSIS NOTE:    Seen and examined at HD  Now with new left IJ perm HD cath'  UF goal decreased to 500 cc due to low BP      Vitals:    01/28/18 1130   BP: 98/56   Pulse:    Resp:    Temp:    Weight:    Height:      General: Alert, pleasant  Chest: CTABL, normal excursion  CVS: S1 + S2 , could not hear any murmur  Legs: no edema, no rash  Neuro: Moving all limbs, alert  Right UE AVG- no bruit  Left IJ perm HD cath      Labs: reviewed  .  A/P:    ESRD patients (MWF) presented with hyperkalemia after missing HD and now with headache.    Now with new HD access- perm HD cath  No further plans for AV access during this admission  Plan next HD Friday    suspect he is below dry weigh - will ask floor to weigh patient        Volume: EDW is 63 kg. UF as tolerted  BP: in 120's  Anemia: Hgb is 9.3 g/dL, On EPO  MBD: P Continue Renvela  Access: Left ram AVF s/p angioplasty during this admission   Continue Patiromer for now.  URR may not be accurate as HD was stopped early.  Water restriction to 1 L as he is becoming hyponatremic.  Low K diet  Avoid daily blood draws if possible  Renal dose meds

## 2018-01-28 NOTE — Progress Notes (Signed)
Patient seen & chart reviewed  Was more alert today  C/o being hungry after HD  Told me he did not have a headache and then holding head- says headache    Vitals:    01/28/18 1230 01/28/18 1245 01/28/18 1248 01/28/18 1334   BP: 106/67 100/64 121/58 118/61   BP Location:    Left arm   Pulse:    68   Resp:   18 16   Temp:   36.6 C (97.9 F) 36.3 C (97.3 F)   TempSrc:    Temporal   SpO2: 100% 100% 100% 99%   Weight:       Height:           Exam:  General: more alert than previous this week, oriented to name only, able to tell me his sisters and daughters names  Lungs: clear  Heart: RRR  Abd: +BS soft ostomy with liquid output  Ext: no edema  Sacrum with stage I blanchable area      Labs: reviewed      Recent Labs  Lab 01/28/18  0847 01/27/18  0633 01/26/18  1246   WBC 12.1* 8.8 9.1   Hemoglobin 10.1* 10.6* 11.3*   Hematocrit 29* 30* 32*   Platelets 159 180 210         Lab results: 01/28/18  0847 01/27/18  0633 01/26/18  1246   Sodium 130* 123* 125*   Potassium 4.0 4.1 4.6   Chloride 84* 74* 75*   CO2 27 20 27    UN 35* 74* 63*   Creatinine 10.43* 15.67* 14.15*   GFR,Caucasian 4* 3* 3*   GFR,Black 5* 3* 4*   Glucose 172* 123* 69   Calcium 9.7 9.6 10.2             Recent Labs  Lab 01/27/18  2148 01/27/18  1838 01/27/18  1340 01/27/18  0957 01/26/18  2256 01/26/18  2024 01/26/18  1453 01/26/18  1010   Glucose POCT 107* 176* 25* 98 158* 128* 110* 9*     72 year old male with PMH significant for CKD V on HD, FAP with ostomy [name] [phone]), type II DM (now well controlled), Barrett;s esophagus, HTN, prostate cancer with TURP presented with severe hyperkalemia- K 9.5 on admit with EKG changes- due to missed HD- -course complicated byherpes zoster ophthalmicus left eye and forehead     Family meeting held today with his sisters Chauncey Cruel and his daughter Stacie-SW Hinda Kehr, attending Wynona Meals- we reviewed current medical issues including   1. Delirium, encephalopathy: likely multifactorial-  hyponatremia, failure to thrive, poor nutritional status- reviewed that we had psychiatry see him and they felt he was delirious as well as encephalopathic added modafanil on Friday- by Monday agitated so stopped  Unclear if this will improve    2. HD: fistula not functional- would require external HD cath as he has now- huge risk of contamination as he often pulls ostomy appliances off    3. What would patient want for himself- daughter and sisters state for a while he has been saying "this is it".  Also discussed likely would need SNF care    Reviewed options:  1. Continue dialysis and to treat new issues and investigate as needed    2. Stop hemodialysis and provide a comfort care approach- with prognosis 2 weeks or less    3. DNR/DNI    After discussion sisters and his daughter had spoke last night and have opted  for comfort care only- as they feel he would not want to live his life this way.  MOLST completed- including comfort care measures only  They wish for a Palliative care consult- called by me    Notified Dr Cedric Fishman- as patient is at risk to remove HD line- will ask IR to remove    Cornelia Copa, NP  4:97 PM  01/28/2018

## 2018-01-28 NOTE — Progress Notes (Signed)
NPN 1800-1900: received patient from 01-1599 onto [id number] via his bed, with staff in attendance, no family present. 01-1599 Nurse Barnett Applebaum stated that she would call sisters to be sure that they knew he was being transferred to [id number] today. 2 GPS staff in attendance, all gowned, on isolation contact precautions for shingles. Patient has had GPS for prior attempts to remove his HD catheter. GPS staff said there is not another GPS coming for next shift. We are paging [facility] to clarify this and to request a GPS given recent hx, although present GPS states that patient has not attempted to pull at HD catheter today. Patient underwent dialysis today and is alert, calm and withdrawn. Per nurse Barnett Applebaum he was tearful earlier today because his sisters visited but they did not awaken him. He answers questions and has asked for ice chips. He swallowed these without difficulty. HD cath  L IJ dressing CDI. RUE AVG without bruit or thrill (this is not a new development, has been this way). Has not required prn haldol. He has denied pain or sob, R's are easy on room air.

## 2018-01-28 NOTE — Consults (Signed)
Palliative Care Consult / History and Physical    Consult Requested by: Wynona Meals, MD    Consult Reason: pain/symptoms, end of life and patient/family support    History of Present Illness: Victor Flowers is a 72 yo male with ESRD on HD, CAD, HTN, DM2, prostate cancer s/p TURP, FAP s/p subtotal colectomy and depression admitted to [facility] on 01/09/18   presented with severe hyperkalemia- K 9.5 on admit with EKG changes- due to missed HD--course complicated byherpes zoster ophthalmicus left eye and forehead, failure to thrive. He refused SNF rehab despite PT recommendations but did not have family support to return home.  He had RUE fistulo malfunctioning, not able to declot.  Placed tunneled dialysis catheter.  .  Seen in IR He became.  He became confused and agitated pulling at lines.   Had family meeting as fistula not functional would require external HD cath with risk of contamination as often pullls ostomy appiances off.   Family opted for comfort care and transferred to 41200 for end of life care.      What bothers you the most? Unable to respond  What helps you cope? Unable to respond    Past Medical History:   Diagnosis Date    Barrett's esophagus     BPH (benign prostatic hyperplasia)     ESRD needing dialysis     FAP (familial adenomatous polyposis)     HLD (hyperlipidemia)     HTN (hypertension)        Past Surgical History:   Procedure Laterality Date    IR ANGIOGRAPHY AV DIALYSIS SHUNT N/A 01/13/2018    IR ANGIOGRAPHY AV DIALYSIS SHUNT 01/13/2018 Alcide Clever, MD [facility] IMG IR/ANG    IR ANGIOGRAPHY AV DIALYSIS SHUNT N/A 01/26/2018    IR ANGIOGRAPHY AV DIALYSIS SHUNT 01/26/2018 Cantos, Marca Ancona, MD [facility] IMG IR/ANG    PR TEMPORAL ARTERY LIGATN OR BX N/A 03/28/2017    Procedure: TEMPORAL ARTERY BIOPSY;  Surgeon: Anselmo Rod, MD;  Location: [facility] MAIN OR;  Service: Ophthalmology       Family Hx:   History reviewed. No pertinent family history.    Social Hx: Lived in [place].  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  He is divorced.  Daughter has children and could not care for him.  He has siblings that could check on him but not provide care.      Spiritual Screen:  1. Are there any particular cultural beliefs or practices that are important for [place] to know as we take care of you? Unable to respond  2. Are there any particular spiritual or religious beliefs or practices that are important for [place] to know as we take care of you? Unable to resond  3. Would you and your family appreciate a visit from an interfaith chaplain?  They are available 24/7. Unable to respond     Allergies:   Allergies   Allergen Reactions    Metformin Diarrhea    Bactrim [Sulfamethoxazole-Trimethoprim] Other (See Comments)     weakness    Heparin Other (See Comments)     Thrombocytopenia. Unclear severity. Per chart review, had a positive SRA    Lisinopril Other (See Comments)     Weakness, renal function changes       Active Medications:   (artificial tears) polyvinyl alcohol  1 drop Left Eye Q4H    trifluridine  1 drop Left Eye Q2H (Scheduled)    valACYclovir  500  mg Oral Daily    erythromycin   Left Eye 3 times per day    bacitracin   Topical 2 times per day    traZODone  25 mg Oral Nightly    timolol  1 drop Left Eye 2 times per day     Continuous Infusions:    PRN Meds:  HYDROmorphone, haloperidol, acetaminophen, melatonin    Palliative Care Review of Systems:  ROS unobtainable/patient unresponsive yes       Palliative Care Performance Status Scale:  Scale = 20  Bedbound/total care/drowsy    Patient Capacity:  Incapacitated    Current Therapies:  No ventilator, dialysis, feeding tube, or TPN.    Physical Examination:  Vitals:    01/28/18 1734   BP:    Pulse:    Resp: 16   Temp:    Weight:    Height:        BP (!) 94/44    Pulse 69    Temp 37 C (98.6 F) (Temporal)    Resp 16    Ht 1.829 m (6')    Wt 59.9 kg (132 lb 0.9 oz)    SpO2 99%    BMI 17.91 kg/m   General appearance:Frail chronically ill  appearing male confused and agitated with ostomy leaking.   Head: Normocephalic, without obvious abnormality, atraumatic   EENT: forehead with lesions to left side scabbed, left eye erythematous  Throat: lips, mucosa, and tongue normal; teeth and gums normal  Lungs: respiration easy  Heart: regular rate and rhythm  Abdomen:cachectic with ostomy leaking light brown liquid stool  Extremities: extremities normal, atraumatic, no cyanosis or edema  Neurologic: agitated, alert, restless could not engage in conversation    Lab Results:   All labs in the last 24 hours:   Recent Results (from the past 24 hour(s))   POCT glucose    Collection Time: 01/27/18  6:38 PM   Result Value Ref Range    Glucose POCT 176 (H) 60 - 99 mg/dL   POCT glucose    Collection Time: 01/27/18  9:48 PM   Result Value Ref Range    Glucose POCT 107 (H) 60 - 99 mg/dL   Basic metabolic panel    Collection Time: 01/28/18  8:47 AM   Result Value Ref Range    Glucose 172 (H) 60 - 99 mg/dL    Sodium 130 (L) 133 - 145 mmol/L    Potassium 4.0 3.3 - 5.1 mmol/L    Chloride 84 (L) 96 - 108 mmol/L    CO2 27 20 - 28 mmol/L    Anion Gap 19 (H) 7 - 16    UN 35 (H) 6 - 20 mg/dL    Creatinine 10.43 (H) 0.67 - 1.17 mg/dL    GFR,Caucasian 4 (!) *    GFR,Black 5 (!) *    Calcium 9.7 8.6 - 10.2 mg/dL   CBC    Collection Time: 01/28/18  8:47 AM   Result Value Ref Range    WBC 12.1 (H) 4.2 - 9.1 THOU/uL    RBC 3.3 (L) 4.6 - 6.1 MIL/uL    Hemoglobin 10.1 (L) 13.7 - 17.5 g/dL    Hematocrit 29 (L) 40 - 51 %    MCV 89 79 - 92 fL    MCH 31 26 - 32 pg/cell    MCHC 35 32 - 37 g/dL    RDW 15.2 (H) 11.6 - 14.4 %    Platelets 159 150 - 330 THOU/uL  Phosphorus    Collection Time: 01/28/18  8:47 AM   Result Value Ref Range    Phosphorus 7.6 (H) 2.7 - 4.5 mg/dL       Radiology Impressions:  Ir Angiography Av Dialysis Shunt    Result Date: 01/28/2018  1.  Successful fistulogram demonstrating multiple venous outflow segments of recurrent moderate stenosis with complete thrombosis of the  outflow. 2.  Subsequent plasty of the entire venous outflow with 10x40 mm high pressure conquest balloon followed by push thrombectomy of the thrombosed segments using the same ballon however with persistence of thrombosis. 3.  Given the rapid recurrence of the stenotic segments (last performed on 01/13/18) and failed declotting, surgical consultation is recommended. 4.  The right upper extremity sheath site purse string suture can be cut after 24 hrs. I was present for the critical and key portions of the procedure or visit and was immediately available in the suite to assist. I have personally reviewed the images and the Resident's/Fellow's interpretation and agree with or edited the findings. [facility] submits this DICOM format image data and final report to the [facility], an independent secure electronic health information exchange, on a reciprocally searchable basis (with patient authorization) for a minimum of 12 months after exam date.    Ir Angiography Av Dialysis Shunt    Result Date: 01/13/2018  Fistulogram demonstrating interval areas of stenoses, as described above. Most of them were successfully angioplastied, however there is a focal area of focal extrinsic compression, which correlates with a palpable hard abnormality is near the distal dialysis site, which may represent scar tissue. [facility] submits this DICOM format image data and final report to the [facility], an independent secure electronic health information exchange, on a reciprocally searchable basis (with patient authorization) for a minimum of 12 months after exam date.    Ir Tunneled Central Line Placement (e.g. Dialysis, Apheresis Cath)    Result Date: 01/28/2018  Successful placement of left internal jugular 14.5 Fr dual lumen 23 cm tunneled dialysis catheter. Ready for immediate use. I was present for the critical and key portions of the procedure or visit and was immediately available in the suite to assist. I have personally  reviewed the images and the Resident's/Fellow's interpretation and agree with or edited the findings. [facility] submits this DICOM format image data and final report to the [facility], an independent secure electronic health information exchange, on a reciprocally searchable basis (with patient authorization) for a minimum of 12 months after exam date.    US Doppler Hemodialysis Graft/fistula    Result Date: 01/18/2018  Right upper extremity arteriovenous graft with a severe stenosis 10cm beyond the anastomosis, near the origin of the stent grafts. This interpretation is based on The Society for Vascular Surgery: Clinical practice guidelines for the surgical placement and maintenance of arteriovenous hemodialysis access. 2008 Findings discussed with Warden Fillers, NP by Dr. Alessandra Bevels at 17:31 01/12/2018. END OF IMPRESSION. I have personally reviewed the images and the Resident's/Fellow's interpretation and agree with or edited the findings. [facility] submits this DICOM format image data and final report to the [facility], an independent secure electronic health information exchange, on a reciprocally searchable basis (with patient authorization) for a minimum of 12 months after exam date.    Assessment/Plan:    Victor Flowers is a 72 yo male with FAP with ostomy [name] puch), DM type II, Barretts esophagus, HTN, prostate cancer s/p T  URP , ESRD on HD admited with  severe hyperkalemia with missed HD developed herpes zoster opthalmicus and encephalopathy with family making decision to transition to comfort measures only and transfer to 41200 for end of life care.      Recommendations  Pain/dyspnea:   Hydromorphone 0.5 mg sq q 15 min prn   Ativan 1 mg sq q 30 min prn    Agitation  Trazadone25 mg at hs  Haldol 1 mg sq q 3 hr prn  Ativan 1 mg sq q 30 min prn    Excess secretions:   Glycopyrrolate prn    Herpes Zoster Othalmicus  Erthromycin ointment left eye q 8 hr  Viroptic solution 1 rtt every 2 hours left  eye  Valtrex 500 mg po daily    GOC/Prognosis:   DNR/DNI/CMO  Few to several days  Recommend hospice consult for inpatient hospice    Family Meeting Scheduled: No      Was goals of care or advance care planning discussed? Yes      Goals of care/Advance care planning    Goals: Comfort  Decision-maker(s): surrogate: daughter    Current code status: DNI/DNR    Is MOLST present? Yes, new MOLST completed     Living will:  no    HCP:  yes, surrogate making decisions (no document)  Name of HCP/Surrogate (if known):     Any important changes not captured above:             Total Time Spent 70 minutes:   >50% of time was spent in counseling and/or coordination of care.     Racheal Patches NP on 01/28/2018 at 6:24 PM.         .

## 2018-01-28 NOTE — Progress Notes (Signed)
Patient transferred to 61600. Report Given. Belongings with patient.  [place], RN

## 2018-01-29 ENCOUNTER — Inpatient Hospital Stay: Payer: Medicare (Managed Care)

## 2018-01-29 DIAGNOSIS — Z452 Encounter for adjustment and management of vascular access device: Secondary | ICD-10-CM

## 2018-01-29 MED ORDER — HALOPERIDOL LACTATE 5 MG/ML IJ SOLN *I*
1.0000 mg | Freq: Three times a day (TID) | INTRAMUSCULAR | Status: DC
Start: 2018-01-29 — End: 2018-01-29
  Administered 2018-01-29: 1 mg via SUBCUTANEOUS
  Filled 2018-01-29: qty 1

## 2018-01-29 MED ORDER — HALOPERIDOL LACTATE 2 MG/ML PO CONCENTRATED *I*
1.0000 mg | Freq: Three times a day (TID) | ORAL | Status: DC
Start: 2018-01-29 — End: 2018-01-31
  Administered 2018-01-30 – 2018-01-31 (×4): 1 mg via ORAL
  Filled 2018-01-29 (×4): qty 5

## 2018-01-29 MED ORDER — HYDROMORPHONE HCL 2 MG/ML IJ SOLN *WRAPPED*
0.5000 mg | INTRAMUSCULAR | Status: DC | PRN
Start: 2018-01-29 — End: 2018-01-31
  Administered 2018-01-30: 0.5 mg via SUBCUTANEOUS
  Filled 2018-01-29: qty 1

## 2018-01-29 MED ORDER — HYDROMORPHONE HCL 2 MG PO TABS *I*
1.0000 mg | ORAL_TABLET | ORAL | Status: DC | PRN
Start: 2018-01-29 — End: 2018-01-31
  Administered 2018-01-30: 1 mg via ORAL
  Filled 2018-01-29: qty 1

## 2018-01-29 MED ORDER — HYDROMORPHONE HCL 2 MG/ML IJ SOLN *WRAPPED*
0.5000 mg | INTRAMUSCULAR | Status: DC
Start: 2018-01-29 — End: 2018-01-29
  Administered 2018-01-29: 0.5 mg via SUBCUTANEOUS
  Filled 2018-01-29: qty 1

## 2018-01-29 MED ORDER — HALOPERIDOL LACTATE 2 MG/ML PO CONCENTRATED *I*
1.0000 mg | ORAL | Status: DC | PRN
Start: 2018-01-29 — End: 2018-01-31
  Administered 2018-01-31 (×2): 1 mg via ORAL
  Filled 2018-01-29 (×2): qty 5

## 2018-01-29 MED ORDER — HYDROMORPHONE HCL 2 MG PO TABS *I*
1.0000 mg | ORAL_TABLET | ORAL | Status: DC
Start: 2018-01-29 — End: 2018-01-31
  Administered 2018-01-29 – 2018-01-31 (×8): 1 mg via ORAL
  Filled 2018-01-29 (×8): qty 1

## 2018-01-29 NOTE — Progress Notes (Signed)
Report Given To  Barbaraann Faster, RN      Descriptive Sentence / Reason for Admission   In with: missed HD x2, hyperkalemia 9.5, chest pain, EKG changes, herpes zoster to L eye and forehead  Dx: Hyperkalemia, Herpes Zoster Ophthalmicus  PMH: CKD, DM, Barretts Esophagus, HTN, prostate CA s/p TURP, glaucoma  A&O: 1          Active Issues / Relevant Events   NPN 0700-1530: Victor Flowers has been lethargic today, but very uncomfortable, calling out, climbing OOB, and pulling at things when he is awake. He also yells out and will get upset if he does not receive the answer he was hoping for. He has received two doses of his PRN dilaudid and one dose of PRN Haldol for pain and for agitation. The combination of dilaudid and haldol together seems to work well for him. His HD cath was removed this morning, site looks good. Colostomy looks good, did not need to be emptied out this shift. He is passing gas rectally too. IP was called and he does still need to be on contact precautions since, per NP, the shingles are not completely scabbed over just yet. Next shift aware and to continue monitoring.       To Do List  01/29/18:SW aware daughter is requesting assistance with POA. If pt has moment of clarity, please page SW ASAP so paperwork can be completed pending availability of notary.       Anticipatory Guidance / Discharge Planning  From: [facility]

## 2018-01-29 NOTE — Progress Notes (Addendum)
[facility]    Writer called patient's sister Victor Flowers after stopping in his room and not finding family there.  I asked her if she would be coming to the hospital today but stated it won't be until tomorrow.  Victor Flowers suggested I call patient's daughter Victor Flowers but got voicemail.  Left message with only my name and my cell number and asked her to call me.  There was no outgoing message and felt less was better as far as a message.    Daughter, Victor Flowers called writer back.  She plans to come to patient's room this Saturday morning.  Gave her office number and I let Intake know she would like to meet with an evaluator from hospice this Saturday.    [facility]  Cell: [phone]  Office: [phone]  After Hours: [phone]

## 2018-01-29 NOTE — Progress Notes (Addendum)
Palliative Care Progress Note  HPI: Victor Flowers is a 72 yo male with FAP with ostomy Victor Flowers puch), DM type II, Barretts esophagus, HTN, prostate cancer s/p T  URP , ESRD on HD admited with severe hyperkalemia with missed HD developed herpes zoster opthalmicus and encephalopathy with family making decision to transition to comfort measures only and transfer to 41200 for end of life care    Subjective: did not rouse to voice or touch    Objective: responsive during night with complaints of right shoulder pain relieved with PRN po hydromorphone.  Became restless picking at HD catheter, given haldol prn with relief.     Prn Medications Used in Last 24 Hours:   Haldol prn x2  Hydromorphone 0.5 mg sq x3    Palliative Care ROS:  Unable to Respond   yes    Physical Examination:   Resp:  [18-28]   General appearance: sleeping soundly appears in no distress  Chest: respiration easy  CV: HRRR  Abd: soft, non distended  Ext: without edema, warm  Neuro: sleeping soundly , did not rouse with voice or light touch    Assessment/Plan:   Agitation controlled with Haldol prn.  As has used frequently will provide atc.  Also required several doses Hydromorphone for pain so will schedule ATC.      Pain/dyspnea:   Hydromorphone 0.5 mg sq q 6 hr and  q 15 min prn   Ativan 1 mg sq q 30 min prn    Agitation  Trazadone25 mg at hs  Haldol 1 mg sq q 6 hr and q 3 hr prn  Ativan 1 mg sq q 30 min prn    Excess secretions:   Glycopyrrolate prn    Herpes Zoster Othalmicus  Erthromycin ointment left eye q 8 hr  Viroptic solution 1 rtt every 2 hours left eye  Valtrex 500 mg po daily    GOC/Prognosis:   DNR/DNI/CMO  Few to several days  Recommend hospice consult for inpatient hospice  Called to update daught    Case D/W nursing, SW, Attending physician    Total Time Spent 35 minutes:   >50% of time was spent in counseling and/or coordination of care.     ___Elaine Drue Flirt NP on 01/29/2018 at 11:04 AM.     PALLIATIVE CARE ATTENDING    I had  face-to-face interaction with the patient today, 01/29/2018 at 1005.   Reason for Visit: Facial pain; Herpes zoster ophthalmicus; Dyspnea; Delirium; End stage renal disease; Anxiety; Debility    Total Time Spent 10 minutes: (Combined Total Time Spent, including Racheal Patches NP, 45 minutes)  >50% of time was spent in counseling and/or coordination of care.     Attestation: I have reviewed and agree with the 01/29/2018 documentation by Racheal Patches NP and, in addition, the history, exam, and plan are notable for the following:    HPI:  Comprehensive history, including Review of Systems, unobtainable secondary to patient's mental status: stuporous    Exam:  Sleeping soundly; stuporous  Respiratory effort normal  Left chest hemodialysis catheter    Medical Decision Making and Plan:  Comfort measures only / End stage renal disease: Patient reportedly pulling at left chest hemodialysis catheter. To be removed today.  Debility: Position and turn for comfort and prevention of pressure sores.  Disposition: Initiate enrollment in inpatient hospice.    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 Unclear severity. Per chart review, had a positive SRA    Lisinopril Other (See Comments)     Weakness, renal function changes       Scheduled Meds:    (artificial tears) polyvinyl alcohol  1 drop Left Eye Q4H    trifluridine  1 drop Left Eye Q4H    valACYclovir  500 mg Oral Daily    erythromycin   Left Eye 3 times per day    bacitracin   Topical 2 times per day    traZODone  25 mg Oral Nightly    timolol  1 drop Left Eye 2 times per day       Continuous Infusions:       PRN Meds:  haloperidol, acetaminophen, HYDROmorphone, LORazepam, bisacodyl, glycopyrrolate, haloperidol lactate, melatonin

## 2018-01-29 NOTE — Progress Notes (Signed)
Report Given To  Caron Presume, RN      Descriptive Sentence / Reason for Admission   In with: missed HD x2, hyperkalemia 9.5, chest pain, EKG changes, herpes zoster to L eye and forehead  Dx: Hyperkalemia, Herpes Zoster Ophthalmicus  PMH: CKD, DM, Barretts Esophagus, HTN, prostate CA s/p TURP, glaucoma  A&O: 1          Active Issues / Relevant Events   NPN 1500-23.00: Contact precautions. Victor Flowers has been lethargic today. He is oriented to self only. During the day he was agitated . He also yells out and will get upset if he does not receive the answer he was hoping for. The combination of his scheduled dilaudid and haldol seem to work well for him. His HD cath was removed this morning, site cdi. Colostomy bag was burped and emptied x1. He is passing gas rectally too. IP was called and he does still need to be on contact precautions since, per NP, the shingles are not completely scabbed over just yet. Next shift aware and to continue monitoring.       To Do List  01/29/18:SW aware daughter is requesting assistance with POA. If pt has moment of clarity, please page SW ASAP so paperwork can be completed pending availability of notary.       Anticipatory Guidance / Discharge Planning  From: [facility]

## 2018-01-29 NOTE — Plan of Care (Signed)
Cognitive function     Cognitive function will be maintained or return to baseline Maintaining        Comfort     Without pain, discomfort, nausea, restlessness, Maintaining        Genitourinary     Absence of urinary retention with optimal Maintaining        Mobility     Patient's functional status is maintained or improved Maintaining        Neurologic:Cognitive functioning     Optimal cognitive functioning within comfort parameters Maintaining        Nutrition     Patient's nutritional status is maintained or improved Maintaining        Oral mucosa     Maintain optimal oral mucosa moisture for comfort Maintaining        Pain/Comfort     Patient's pain or discomfort is manageable Maintaining        Psychosocial     Demonstrates ability to cope with illness Maintaining        Respiratory     Respiratory rate within comfort parameters Maintaining        Safety     Patient will remain free of falls Maintaining        Safety and environment     Without injury or discomfort related to risk factors Maintaining        Skin / Wound     Maintains integrity of skin Maintaining

## 2018-01-29 NOTE — Progress Notes (Signed)
Report Given To  Sharl Ma, RN       Descriptive Sentence / Reason for Admission   In with: missed HD x2, hyperkalemia 9.5, chest pain, EKG changes, herpes zoster to L eye and forehead  Dx: Hyperkalemia, Herpes Zoster Ophthalmicus  PMH: CKD, DM, Barretts Esophagus, HTN, prostate CA s/p TURP, glaucoma  A&O: 1-3  Ambulation/ continence/ alarm: 2A / anuric, colostomy / yes  Diet/Pills: Diabetic [facility] / PW        Active Issues / Relevant Events   NPN 1900-0700: Pt lethargic most of the night. Is able to respond to questions when asked. Pt re'c x2 PRNs of PO dilaudid for R shoulder pain. Also re'c x1 PRN haldol as he had started to become restless and was picking at HD cath. Like ice chips and cold water. Colostomy drained 444mls of watery stool. Pt anuric. Pt very concerned about his sisters and if they know where he is. Pt needs frequent reassuring that he is safe and his family knows where he is.        To Do List  VS/ Tele/ O2: q8 / no tele / RA  IV: none  Caps and Lines Change Due: 6/20  PRNs: melatonin, tylenol  DVT Prophy:   Wounds: forehead - O2A c bacitracin & erythromycin  Labs: daily      Anticipatory Guidance / Discharge Planning  From: [facility]: SNF vs home  When: TBD  Barriers to Discharge: placement vs 24hr care  Narcan Candidate: no  Pre-Discharge Education (PDEP):

## 2018-01-29 NOTE — Consults (Signed)
Medical Nutrition Therapy Brief Note:    Pt has elected goals of comfort care. Pt on regular diet. Please offer food/fluids as desired and tolerated for comfort at this time. Nutrition Services respectfully signs off at this time. Please re-consult if care plan changes or if further nutrition intervention is needed.     Vaishnavi Dalby, MS, RD  Clinical Dietitian  [facility] # [id number]

## 2018-01-29 NOTE — Plan of Care (Signed)
Cognitive function     Cognitive function will be maintained or return to baseline Maintaining        Genitourinary     Absence of urinary retention with optimal Maintaining        Mobility     Patient's functional status is maintained or improved Maintaining        Neurologic:Cognitive functioning     Optimal cognitive functioning within comfort parameters Maintaining        Nutrition     Patient's nutritional status is maintained or improved Maintaining        Oral mucosa     Maintain optimal oral mucosa moisture for comfort Maintaining        Pain/Comfort     Patient's pain or discomfort is manageable Maintaining        Psychosocial     Demonstrates ability to cope with illness Maintaining        Respiratory     Respiratory rate within comfort parameters Maintaining        Safety     Patient will remain free of falls Maintaining        Safety and environment     Without injury or discomfort related to risk factors Maintaining        Skin / Wound     Maintains integrity of skin Maintaining          Comfort     Without pain, discomfort, nausea, restlessness, Progressing towards goal

## 2018-01-29 NOTE — Comprehensive Assessment (Signed)
Palliative Care Social Work Initial Assessment      Demographics:  Religious Beliefs: [employer] of Residence: Other (Comment)     Ethnicity/Race: Caucasian    Risk Factors:  Risk Factors: End of life       Advance Directive:  *Has patient (or family) completed any of the following? (select all that apply): MOLST               *Would they like to discuss any issues related to MOLST, DNR Order, HCP, Living Will, or POA?: Yes  Consult with medical provider arranged: No (provider is the one completing the section)     *Health Care Directive teaching done: Yes    Contacts/Support System:  Spokesperson Name: Donalynn Furlong  Relationship to Patient : Sister  Phone Number(s): [phone]    Living Situation:  Lives With: Alone       Psychosocial:  Coping Status: With some difficulty  Emotional Impression: Anxious  Current Goal of Care: [facility]  Preferences Regarding Death: Place (comment) (Likely to remain 68)     Anticipatory Grief / Bereavement Risk: Normal risk    Income Information:  Vocational: Retired        Insurance underwriter Information: [employer], Medicare  Prescription Coverage: and has     Served in [place] military: Yes  Service Connected: No        SW rounded with medical team this morning. No family at bedside.     Pt appropriate for inpatient hospice. SW faxed referral to intake and emailed Occupational hygienist team.     SW aware daughter is requesting assistance with POA. If pt has moment of clarity, please page SW ASAP so paperwork can be completed pending availability of notary.     Plan: SW to continue to provide ongoing support and resources as needed.          Corlis Leak, LMSW   11:30 AM on 01/29/2018

## 2018-01-30 NOTE — Plan of Care (Signed)
Cognitive function     Cognitive function will be maintained or return to baseline Maintaining        Comfort     Without pain, discomfort, nausea, restlessness, Maintaining        Genitourinary     Absence of urinary retention with optimal Maintaining        Mobility     Patient's functional status is maintained or improved Maintaining        Neurologic:Cognitive functioning     Optimal cognitive functioning within comfort parameters Maintaining        Nutrition     Patient's nutritional status is maintained or improved Maintaining        Oral mucosa     Maintain optimal oral mucosa moisture for comfort Maintaining        Pain/Comfort     Patient's pain or discomfort is manageable Maintaining        Psychosocial     Demonstrates ability to cope with illness Maintaining        Respiratory     Respiratory rate within comfort parameters Maintaining        Safety     Patient will remain free of falls Maintaining        Safety and environment     Without injury or discomfort related to risk factors Maintaining        Skin / Wound     Maintains integrity of skin Maintaining

## 2018-01-30 NOTE — Progress Notes (Signed)
Report Given To  Sharl Ma, RN       Descriptive Sentence / Reason for Admission   In with: missed HD x2, hyperkalemia 9.5, chest pain, EKG changes, herpes zoster to L eye and forehead  Dx: Hyperkalemia, Herpes Zoster Ophthalmicus  PMH: CKD, DM, Barretts Esophagus, HTN, prostate CA s/p TURP, glaucoma  A&O: 1          Active Issues / Relevant Events   NPN 2300-0700: Pt lethargic. Appeared comfortable most of the night. Takes PO meds. Colostomy bag draining of 326mls. At times does get frustrated with staff if he needs something or if something doesn't go his way. Pt had kept refusing a bath but eventually did re'c one as it had been several days since his last bath. Like ice water, ice chips and coffee. Pt continues to call out for his sisters however no family at the bedside overnight.           To Do List  01/29/18:SW aware daughter is requesting assistance with POA. If pt has moment of clarity, please page SW ASAP so paperwork can be completed pending availability of notary.       Anticipatory Guidance / Discharge Planning  From: [facility]

## 2018-01-30 NOTE — Progress Notes (Signed)
Report Given To  Kara Dies, RN      Descriptive Sentence / Reason for Admission   In with: missed HD x2, hyperkalemia 9.5, chest pain, EKG changes, herpes zoster to L eye and forehead  Dx: Hyperkalemia, Herpes Zoster Ophthalmicus  PMH: CKD, DM, Barretts Esophagus, HTN, prostate CA s/p TURP, glaucoma  A&O: 1          Active Issues / Relevant Events   NPN 0700-1530: Victor Flowers has been more lethargic this shift, receiving his scheduled doses of dilaudid & haldol, but has not required any PRN's this shift. He does call out at times, but often does not know what he needs when he is asked. His colostomy pouch & wafer was changed twice today, once to hook up to a foley bag, and once because it was kinked and leaked. It is currently hooked up to foley bag for better accessibility & drainage. He has a SQ disc in the upper R arm for dilaudid. He at times turns himself and at other times needs to be repositioned. He is anuric. His sister was here for a short while in the morning but did not stay long. Next shift aware and to continue monitoring.       To Do List  01/29/18:SW aware daughter is requesting assistance with POA. If pt has moment of clarity, please page SW ASAP so paperwork can be completed pending availability of notary.       Anticipatory Guidance / Discharge Planning  From: [facility]

## 2018-01-30 NOTE — Progress Notes (Signed)
Report Given To  Excell Seltzer, RN      Descriptive Sentence / Reason for Admission   In with: missed HD x2, hyperkalemia 9.5, chest pain, EKG changes, herpes zoster to L eye and forehead  Dx: Hyperkalemia, Herpes Zoster Ophthalmicus  PMH: CKD, DM, Barretts Esophagus, HTN, prostate CA s/p TURP, glaucoma  A&O: 1          Active Issues / Relevant Events   NPN 15:00 - 23:30  Victor Flowers is quite lethargic this evening and does not awake enough to tolerate any PO including pills. His ileostomy was attached to a drainage bag, but that system failed as his ostomy appliance came loose. It was replaced using a donut under the appliance as his skin indents around his stoma which is small, but beefy red. Will continue to assess wakefulness as may need to switch to SQ for his PO meds.       To Do List  01/29/18:SW aware daughter is requesting assistance with POA. If pt has moment of clarity, please page SW ASAP so paperwork can be completed pending availability of notary.       Anticipatory Guidance / Discharge Planning  From: [facility]

## 2018-01-30 NOTE — Plan of Care (Signed)
Comfort     Without pain, discomfort, nausea, restlessness, Maintaining      Patient requiring infrequent doses of PRN medications. Offering & making sure patient aware of the availability.         Genitourinary     Absence of urinary retention with optimal Maintaining          Safety and environment     Without injury or discomfort related to risk factors Maintaining      Patient able to utilize call bell appropriately and most often calls for assistance. At times though, needs re-orientation & reminders to appropriately get OOB or perform tasks.

## 2018-01-30 NOTE — Progress Notes (Addendum)
Palliative Care Progress Note  HPI:  Victor Flowers is a 72 yo male with FAP with ostomy [name] puch), DM type II, Barretts esophagus, HTN, prostate cancer s/p T  URP , ESRD on HD admited with severe hyperkalemia with missed HD developed herpes zoster opthalmicus and encephalopathy with family making decision to transition to comfort measures only and transfer to 41200 for end of life care    Subjective: denies pain or headache, poorly interactive      Objective:   Per nursing continues to be alert, restless and needy at times and at times frustrated.  Is taking small amounts po.  Liquid output via ostomy.  Anuric.      Prn Medications Used in Last 24 Hours:   No prns given since Hydromorphone and Haldol scheduled ATC    Palliative Care ROS:  Pain   Moderate  Anorexia   Moderate  Anxiety   Mild  Shortness of Breath   None  Tiredness/Fatigue   Severe  Drowsiness/Sleepiness   Mild  Constipation   no  Delirium   yes  ROS negative except as noted above.      Physical Examination:   Resp:  [18-28]   General appearance:Frail chronically ill appearing male confused but comfortable appearing in no distress  Head: Normocephalic, without obvious abnormality, atraumatic   EENT: forehead with lesions to left side scabbed, some serous drainage noted,  left eye erythematous less swollen  Throat: lips, mucosa, and tongue normal; teeth and gums normal  Lungs: respiration easy  Heart: regular rate and rhythm  Abdomen:non distended  Extremities: extremities normal, atraumatic, no cyanosis or edema  Neurologic: awake but confused, no agitation during visit, does not engage in sensible conversations    Assessment/Plan:   Remains weak and confused but less agitated.  Minimal po intake.  Will follow over next few days, if without decline over weekend will need to consider disposition to community hospice facility.      Pain/dyspnea:   Hydromorphone 0.5 mg sq q 4 hr and  q 15 min prn   Ativan 1 mg sq q 30 min prn    Agitation  Trazadone25 mg  at hs  Haldol 1 mg sq q 6 hr and q 3 hr prn  Ativan 1 mg sq q 30 min prn    Excess secretions:   Glycopyrrolate prn    Herpes Zoster Othalmicus  Erthromycin ointment left eye q 8 hr  Viroptic solution 1 rtt every 2 hours left eye  Valtrex 500 mg po daily    GOC/Prognosis:   DNR/DNI/CMO  Few to several days  Recommend hospice consult, may need to consider community hospice plan if continues to be alert and taking po however is anuric, will likely decline quickly    Case D/W nursing, SW, Attending physician    Total Time Spent 35 minutes:   >50% of time was spent in counseling and/or coordination of care.     Racheal Patches NP on 01/30/2018 at 12:12 PM.    PALLIATIVE CARE ATTENDING    I had face-to-face interaction with the patient today, 01/30/2018 at 1003.   Reason for Visit: Facial pain; Herpes zoster ophthalmicus; Dyspnea; Delirium; End stage renal disease; Anxiety; Debility    Total Time Spent 10 minutes: (Combined Total Time Spent, including Racheal Patches NP, 45 minutes)  >50% of time was spent in counseling and/or coordination of care.     Attestation: I have reviewed and agree with the 01/30/2018 documentation by Racheal Patches NP  and, in addition, the history, exam, and plan are notable for the following:    HPI:  Patient denies pain, including facial pain.  Comprehensive history, including Review of Systems, unobtainable secondary to patient's mental status: delirium    Exam:  Left forehead rash. Lethargic, moderately dysarthric, confused.  Lungs clear.    Medical Decision Making and Plan:  Debility: Position and turn for comfort and prevention of pressure sores.  Disposition: Plan for enrollment in inpatient hospice.    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 Unclear severity. Per chart review, had a positive SRA    Lisinopril Other (See Comments)     Weakness, renal function changes       Scheduled Meds:    haloperidol  1 mg Oral Q8H    HYDROmorphone  1 mg Oral Q4H    (artificial tears) polyvinyl alcohol  1 drop Left Eye Q4H    trifluridine  1 drop Left Eye Q4H    valACYclovir  500 mg Oral Daily    erythromycin   Left Eye 3 times per day    bacitracin   Topical 2 times per day    traZODone  25 mg Oral Nightly    timolol  1 drop Left Eye 2 times per day       Continuous Infusions:       PRN Meds:  HYDROmorphone PF, haloperidol, HYDROmorphone, acetaminophen, LORazepam, bisacodyl, glycopyrrolate, haloperidol lactate, melatonin

## 2018-01-30 NOTE — Interdisciplinary Rounds (Addendum)
Pall Care Interdisciplinary Rounds note:    Date: 01/30/18      Disciplines present: Nurse Practitioner, Physician and Registered Nurse, hospice RN, hospice SW  Prognosis: Few to several days  Dispo: EOL on 4-12  Symptom Management: Confused, restless, eating very little. Ointment and Abx for herpes zoster. See provider note for details of symptom management.  Spiritual Needs: Chaplaincy following  Hospice (if appropriate): Hospice referral.  Hospice to meet with daughter on Saturday    Date:    Disciplines present:   Updates:

## 2018-01-30 NOTE — Progress Notes (Signed)
01/30/18 0900   [facility] Patient Class Review   Patient Class Review Inpatient   Patient Class Effective 01/09/2018  Earnest Conroy RN   Utilization  Management  X [phone]  Page [phone]

## 2018-01-30 NOTE — Progress Notes (Signed)
Writer attempted to complete a spiritual assessment with Victor Flowers.  The pt was sitting up in bed at the time of the visit and immediately asked for a bowl of cereal.  When asked his religious preference, Victor Flowers answered, "Episcopalian."  However, when asked what church he attended, he responded "[place] [employer]."  He then stopped responding verbally to writer's questions but did nod his head when asked if he was tired.  At one point, pt. winced. When asked if he was in pain, pt nodded in the affirmative.  Writer asked if he wanted to call his nurse and he again nodded in the affirmative.  Pt's nurse was notified of his request.    Chaplaincy services remains available on a 24/7 basis by paging [phone].

## 2018-01-31 ENCOUNTER — Inpatient Hospital Stay
Admission: AD | Admit: 2018-01-31 | Discharge: 2018-02-09 | DRG: 425 | Disposition: E | Payer: PRIVATE HEALTH INSURANCE | Source: Intra-hospital | Attending: Internal Medicine | Admitting: Internal Medicine

## 2018-01-31 DIAGNOSIS — E1122 Type 2 diabetes mellitus with diabetic chronic kidney disease: Secondary | ICD-10-CM | POA: Diagnosis present

## 2018-01-31 DIAGNOSIS — H538 Other visual disturbances: Secondary | ICD-10-CM | POA: Diagnosis present

## 2018-01-31 DIAGNOSIS — I213 ST elevation (STEMI) myocardial infarction of unspecified site: Secondary | ICD-10-CM | POA: Diagnosis present

## 2018-01-31 DIAGNOSIS — Z932 Ileostomy status: Secondary | ICD-10-CM

## 2018-01-31 DIAGNOSIS — I2510 Atherosclerotic heart disease of native coronary artery without angina pectoris: Secondary | ICD-10-CM | POA: Diagnosis present

## 2018-01-31 DIAGNOSIS — G4700 Insomnia, unspecified: Secondary | ICD-10-CM | POA: Diagnosis present

## 2018-01-31 DIAGNOSIS — I120 Hypertensive chronic kidney disease with stage 5 chronic kidney disease or end stage renal disease: Secondary | ICD-10-CM | POA: Diagnosis present

## 2018-01-31 DIAGNOSIS — R54 Age-related physical debility: Secondary | ICD-10-CM | POA: Diagnosis present

## 2018-01-31 DIAGNOSIS — Z515 Encounter for palliative care: Secondary | ICD-10-CM | POA: Diagnosis present

## 2018-01-31 DIAGNOSIS — E213 Hyperparathyroidism, unspecified: Secondary | ICD-10-CM | POA: Diagnosis present

## 2018-01-31 DIAGNOSIS — R627 Adult failure to thrive: Secondary | ICD-10-CM | POA: Diagnosis present

## 2018-01-31 DIAGNOSIS — R0600 Dyspnea, unspecified: Secondary | ICD-10-CM

## 2018-01-31 DIAGNOSIS — R51 Headache: Secondary | ICD-10-CM | POA: Diagnosis present

## 2018-01-31 DIAGNOSIS — E871 Hypo-osmolality and hyponatremia: Secondary | ICD-10-CM | POA: Diagnosis present

## 2018-01-31 DIAGNOSIS — K219 Gastro-esophageal reflux disease without esophagitis: Secondary | ICD-10-CM | POA: Diagnosis present

## 2018-01-31 DIAGNOSIS — E873 Alkalosis: Secondary | ICD-10-CM | POA: Diagnosis present

## 2018-01-31 DIAGNOSIS — D631 Anemia in chronic kidney disease: Secondary | ICD-10-CM | POA: Diagnosis present

## 2018-01-31 DIAGNOSIS — B0230 Zoster ocular disease, unspecified: Secondary | ICD-10-CM

## 2018-01-31 DIAGNOSIS — G9340 Encephalopathy, unspecified: Secondary | ICD-10-CM | POA: Diagnosis present

## 2018-01-31 DIAGNOSIS — N186 End stage renal disease: Secondary | ICD-10-CM | POA: Diagnosis present

## 2018-01-31 DIAGNOSIS — Z794 Long term (current) use of insulin: Secondary | ICD-10-CM

## 2018-01-31 DIAGNOSIS — H409 Unspecified glaucoma: Secondary | ICD-10-CM | POA: Diagnosis present

## 2018-01-31 DIAGNOSIS — K2270 Barrett's esophagus without dysplasia: Secondary | ICD-10-CM | POA: Diagnosis present

## 2018-01-31 DIAGNOSIS — Z66 Do not resuscitate: Secondary | ICD-10-CM | POA: Diagnosis present

## 2018-01-31 DIAGNOSIS — M25512 Pain in left shoulder: Secondary | ICD-10-CM | POA: Diagnosis present

## 2018-01-31 DIAGNOSIS — R5381 Other malaise: Secondary | ICD-10-CM

## 2018-01-31 DIAGNOSIS — Z9115 Patient's noncompliance with renal dialysis: Secondary | ICD-10-CM

## 2018-01-31 DIAGNOSIS — F419 Anxiety disorder, unspecified: Secondary | ICD-10-CM

## 2018-01-31 DIAGNOSIS — R410 Disorientation, unspecified: Secondary | ICD-10-CM

## 2018-01-31 DIAGNOSIS — F329 Major depressive disorder, single episode, unspecified: Secondary | ICD-10-CM | POA: Diagnosis present

## 2018-01-31 DIAGNOSIS — R21 Rash and other nonspecific skin eruption: Secondary | ICD-10-CM | POA: Diagnosis present

## 2018-01-31 DIAGNOSIS — Z992 Dependence on renal dialysis: Secondary | ICD-10-CM

## 2018-01-31 DIAGNOSIS — E875 Hyperkalemia: Principal | ICD-10-CM | POA: Diagnosis present

## 2018-01-31 DIAGNOSIS — Z87891 Personal history of nicotine dependence: Secondary | ICD-10-CM

## 2018-01-31 DIAGNOSIS — E11649 Type 2 diabetes mellitus with hypoglycemia without coma: Secondary | ICD-10-CM | POA: Diagnosis present

## 2018-01-31 DIAGNOSIS — R001 Bradycardia, unspecified: Secondary | ICD-10-CM | POA: Diagnosis present

## 2018-01-31 MED ORDER — BACITRACIN 500 UNIT/GM EX OINT *WRAPPED*
TOPICAL_OINTMENT | Freq: Two times a day (BID) | CUTANEOUS | Status: DC
Start: 2018-01-31 — End: 2018-02-02
  Filled 2018-01-31: qty 28

## 2018-01-31 MED ORDER — HYDROMORPHONE HCL 2 MG/ML IJ SOLN *WRAPPED*
0.5000 mg | INTRAMUSCULAR | Status: DC | PRN
Start: 2018-01-31 — End: 2018-01-31

## 2018-01-31 MED ORDER — TIMOLOL MALEATE 0.25 % OP SOLN WRAPPED *I*
1.0000 [drp] | Freq: Two times a day (BID) | OPHTHALMIC | Status: DC
Start: 2018-01-31 — End: 2018-02-02
  Administered 2018-01-31 – 2018-02-02 (×4): 1 [drp] via OPHTHALMIC
  Filled 2018-01-31: qty 5

## 2018-01-31 MED ORDER — GLYCOPYRROLATE 0.2 MG/ML IJ SOLN *WRAPPED*
0.4000 mg | INTRAMUSCULAR | Status: DC | PRN
Start: 2018-01-31 — End: 2018-02-03

## 2018-01-31 MED ORDER — BISACODYL 10 MG RE SUPP *I*
10.0000 mg | Freq: Every day | RECTAL | Status: DC | PRN
Start: 2018-01-31 — End: 2018-02-04

## 2018-01-31 MED ORDER — ACETAMINOPHEN 650 MG RE SUPP *I*
650.0000 mg | RECTAL | Status: DC | PRN
Start: 2018-01-31 — End: 2018-02-04

## 2018-01-31 MED ORDER — ACETAMINOPHEN 650 MG RE SUPP *I*
650.0000 mg | RECTAL | Status: DC | PRN
Start: 2018-01-31 — End: 2018-01-31

## 2018-01-31 MED ORDER — HALOPERIDOL LACTATE 5 MG/ML IJ SOLN *I*
1.0000 mg | Freq: Four times a day (QID) | INTRAMUSCULAR | Status: DC
Start: 2018-01-31 — End: 2018-01-31

## 2018-01-31 MED ORDER — ERYTHROMYCIN 5 MG/GM OP OINT *I*
TOPICAL_OINTMENT | Freq: Three times a day (TID) | OPHTHALMIC | Status: DC
Start: 2018-01-31 — End: 2018-02-02
  Filled 2018-01-31: qty 1

## 2018-01-31 MED ORDER — HYDROMORPHONE HCL 2 MG/ML IJ SOLN *WRAPPED*
0.2500 mg | INTRAMUSCULAR | Status: DC
Start: 2018-01-31 — End: 2018-02-02
  Administered 2018-01-31 – 2018-02-02 (×11): 0.26 mg via SUBCUTANEOUS
  Filled 2018-01-31 (×11): qty 1

## 2018-01-31 MED ORDER — HALOPERIDOL LACTATE 5 MG/ML IJ SOLN *I*
1.0000 mg | INTRAMUSCULAR | Status: DC | PRN
Start: 2018-01-31 — End: 2018-01-31

## 2018-01-31 MED ORDER — HYDROMORPHONE HCL 2 MG/ML IJ SOLN *WRAPPED*
0.5000 mg | INTRAMUSCULAR | Status: DC | PRN
Start: 2018-01-31 — End: 2018-02-04

## 2018-01-31 MED ORDER — HALOPERIDOL LACTATE 5 MG/ML IJ SOLN *I*
1.0000 mg | INTRAMUSCULAR | Status: DC | PRN
Start: 2018-01-31 — End: 2018-02-04

## 2018-01-31 MED ORDER — TRIFLURIDINE 1 % OP SOLN *I*
1.0000 [drp] | OPHTHALMIC | Status: DC
Start: 2018-01-31 — End: 2018-02-03
  Administered 2018-01-31 – 2018-02-03 (×16): 1 [drp] via OPHTHALMIC

## 2018-01-31 MED ORDER — HALOPERIDOL LACTATE 5 MG/ML IJ SOLN *I*
1.0000 mg | Freq: Four times a day (QID) | INTRAMUSCULAR | Status: DC
Start: 2018-01-31 — End: 2018-02-04
  Administered 2018-01-31 – 2018-02-04 (×14): 1 mg via SUBCUTANEOUS
  Filled 2018-01-31 (×14): qty 1

## 2018-01-31 MED ORDER — LORAZEPAM 2 MG/ML IJ SOLN *I*
1.0000 mg | INTRAMUSCULAR | Status: DC | PRN
Start: 2018-01-31 — End: 2018-02-03
  Administered 2018-02-02: 1 mg via SUBCUTANEOUS
  Filled 2018-01-31: qty 1

## 2018-01-31 MED ORDER — HYDROMORPHONE HCL 2 MG/ML IJ SOLN *WRAPPED*
0.2500 mg | INTRAMUSCULAR | Status: DC
Start: 2018-01-31 — End: 2018-01-31

## 2018-01-31 MED ORDER — VALACYCLOVIR HCL 500 MG PO TABS *I*
500.0000 mg | ORAL_TABLET | Freq: Every day | ORAL | Status: DC
Start: 2018-02-01 — End: 2018-02-01
  Filled 2018-01-31 (×2): qty 1

## 2018-01-31 MED ORDER — ARTIFICIAL TEARS (POLYVINYL ALCOHOL) 1.4 % OP SOLN *I*
1.0000 [drp] | OPHTHALMIC | Status: DC
Start: 2018-01-31 — End: 2018-02-02
  Administered 2018-01-31 – 2018-02-02 (×11): 1 [drp] via OPHTHALMIC

## 2018-01-31 NOTE — Plan of Care (Signed)
Genitourinary    • Absence of urinary retention with optimal Maintaining        Musculoskeletal    • Optimal patient comfort and mobility; participate in ADLs Maintaining        Neurologic:Cognitive functioning    • Optimal cognitive functioning within comfort parameters Maintaining        Oral mucosa    • Maintain optimal oral mucosa moisture for comfort Maintaining        Psychosocial    • Patient/family participate in care Maintaining        Respiratory    • Respiratory rate within comfort parameters Maintaining        Safety and environment    • Without injury or discomfort related to risk factors Maintaining        Skin / Wound    • Maintains integrity of skin Maintaining        Skin integrity of IV or subcutaneous site    • Subcutaneous or IV insertion site without infiltration or re Maintaining          Comfort    • Without pain, discomfort, nausea, restlessness, Progressing towards goal

## 2018-01-31 NOTE — Progress Notes (Signed)
Writer responded to a page from the hospice nurse, Gerald Stabs, stating that the pt was tearful and holding on to his daughter, Stacy's, hand.  Upon arrival, the pt was partially sitting up in the bed holding his daughter's hand.  He quickly began to fall asleep.  Pt's daughter voiced concern that her father did not seem to realize that she had been there multiple times to visit him and asked, "Where have you been?"  She would like for him to be reminded that she has visited as frequently as possible.  She also stated that she "doesn't want him to know" so that he will not be afraid.  She is leaving now to go out of state to take care of her father's affairs.    Pt's daughter confirmed that he is [place], not [employer].    Plan of care:  Writer will visit pt as often as possible and will remind him that Marzetta Board has been there to visit him.

## 2018-01-31 NOTE — Progress Notes (Addendum)
Palliative Care Progress Note    HPI: 72 yo M w/ familial adenomatous polyposis with ostomy [name] pouch), DM type II, Barretts esophagus, HTN, prostate cancer s/p TURP , ESRD on HD admited 5/31 with severe hyperkalemia with missed HD developed herpes zoster opthalmicus and encephalopathy with family making decision 6/19 to transition to comfort measures only and transfer to 41200 for end of life care.    Subjective: nonresponsive    Objective: chart reviewed, pt w/ confusion overnight, trying to get OOB, per RN notes becoming more difficult to take PO meds; visited w/ pt's dtr Stacy this AM -she was advised to contact her PCP re need for ppx antiviral d/t her own health issues and immunosuppression; dtr Stacy tearful, worries pt may be scared of dying and advocating for pt to be on sleepier side now; RN and dtr in agreement w/ switching meds to [facility] route; chaplain visited and provided support to dtr; [facility] met w/ dtr and will plan to enroll pt today    Palliative Care ROS:  Unable to Respond   yes  Last stool 6/22 (ostomy)    Physical Examination:   Resp:  [10-14]   Gen appearance: elderly Caucasian male, laying in fetal position, eyes open but not responsive to voice or touch, appears comfortable  Lungs: easy resp, RA  Heart: reg  Abd: soft, ostomy  Extrem: no edema  Skin: HSV rash w/ dried drainage around L eye  Neuro: minimally responsive this AM, per RN pt having increased difficulty taking sm amt w/ PO meds, very weak    Assessment/Plan:  72 yo M w/ FAP w/ ostomy, admitted w/ hyperkalemia after missed HD and encephalopathy in setting of HSV opthalmicus. Pt now CMO plan and rapidly declining, minimally responsive and no longer safe for PO. Plan to enroll w/ [facility] today.    Pain/dyspnea  Dilaudid 1 mg po Q 4 hrs -change to Dilaudid 0.25 mg SC q 4 hrs  Valacyclovir 500 mg po daily for HSV as comfort measure -leave ordered if able to take any PO  Acetaminophen 650 mg po Q 6 hrs prn -switch to  supp  Dilaudid 0.5 mg SC Q 30 min prn (x 1 on 6/21) -change to Q 15 min prn  Dilaudid 1 mg po Q 30 min prn (x 1 on 6/21) -stopped    Anxiety/agitation/nausea/insomnia  Haldol 1 mg po Q 8 hrs -change to Haldol 1 mg SC Q 6 hrs  Trazodone 25 mg po QHS -stopped  Haldol 1 mg po Q 4 hrs prn (x 1 on 6/22) -stopped  Haldol 1 mg SC Q 3 hrs prn  Lorazepam 1 mg SC Q 30 min prn  Melatonin 3 mg po QHS prn (last x 1 on 6/18) -stopped    Airway secretions/eye care/skin care  Refresh eye gtts Q 4 hrs  Bacitracin oint BID to scalp  Erythromycin opth oint TID L eye  Timolol opth 1 gtt BID L eye  Trifluridine opth 1 gtt Q 4 hrs L eye  Robinul 0.4 mg SC Q 4 hrs prn    Prevention of constipation, last stool 6/22 (ostomy)  Bisacodyl supp pr daily prn    GOC/prognosis  DNR/DNI, CMO  Dtr [name] is NOK and surrogate decision maker [phone]) -wants to do POA, SW aware  Prognosis days to week  Dtr met w/ [facility] this AM, plan to enroll pt w/ inpt hospice    Pt case discussed w/ RN, Gerald Stabs RN from [facility] and Dr Irven [place].    Total Time Spent 35 minutes:   >50% of time was spent in counseling and/or coordination of care.     Elroy Channel NP  Palliative Care Consult Service  Pager # [phone]    PALLIATIVE CARE ATTENDING    I had face-to-face interaction with the patient today, 01/12/2018 at 1325.   Reason for Visit: Facial pain; Herpes zoster ophthalmicus; Dyspnea; Delirium; End stage renal disease; Anxiety; Debility    Total Time Spent 10 minutes: (Combined Total Time Spent, including Ann Syrett NP, 45 minutes)  >50% of time was spent in counseling and/or coordination of care.     Attestation: I have reviewed and agree with the 02/05/2018 documentation by Elroy Channel NP and, in addition, the history, exam, and plan are notable for the following:    HPI:  Comprehensive history, including Review of Systems, unobtainable secondary to patient's mental status: delirium    Exam:  Eyes open, stuporous  Lungs clear    Medical Decision Making and  Plan:  Debility: Position and turn for comfort and prevention of pressure sores.  Disposition: Enrollment in inpatient hospice today.    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 Unclear severity. Per chart review, had a positive SRA    Lisinopril Other (See Comments)     Weakness, renal function changes       Scheduled Meds:    haloperidol  1 mg Oral Q8H    HYDROmorphone  1 mg Oral Q4H    (artificial tears) polyvinyl alcohol  1 drop Left Eye Q4H    trifluridine  1 drop Left Eye Q4H    valACYclovir  500 mg Oral Daily    erythromycin   Left Eye 3 times per day    bacitracin   Topical 2 times per day    traZODone  25 mg Oral Nightly    timolol  1 drop Left Eye 2 times per day       Continuous Infusions:       PRN Meds:  HYDROmorphone PF, haloperidol, HYDROmorphone, acetaminophen, LORazepam, bisacodyl, glycopyrrolate, haloperidol lactate, melatonin

## 2018-01-31 NOTE — H&P (Signed)
Pt has been discharged and readmitted to [facility]. Please see today's progress note for details.    Ossie Beltran Syrett NP  Palliative Care Consult Service  Pager # [phone]

## 2018-01-31 NOTE — Progress Notes (Signed)
Report Given To  Sherryle Lis, RN      Descriptive Sentence / Reason for Admission   In with: missed HD x2, hyperkalemia 9.5, chest pain, EKG changes, herpes zoster to L eye and forehead  Dx: Hyperkalemia, Herpes Zoster Ophthalmicus  PMH: CKD, DM, Barretts Esophagus, HTN, prostate CA s/p TURP, glaucoma  A&O: 1      Active Issues / Relevant Events   NPN 2300-0700  Victor Flowers is quite lethargic this evening. His ileostomy stayed intact overnight. Was able to take PO ATC meds, crushed in cheek with small amt of juice. PRN PO haldol given this morning, he woke confused, tried to get oob/rolled his shoulders out of bed, and started to call out "what happened". Will continue to assess wakefulness as may need to switch to SQ for his PO meds.       To Do List  01/29/18:SW aware daughter is requesting assistance with POA. If pt has moment of clarity, please page SW ASAP so paperwork can be completed pending availability of notary.       Anticipatory Guidance / Discharge Planning  From: [facility] [place]

## 2018-01-31 NOTE — Progress Notes (Addendum)
[place] evaluation & admission note    Patient IS enrolled in hospice  Hospice level of care: GIP  Hospice Dx: ESRD  Hospice attending physician: Dr. Farrel Demark    Met with patient and patient's family to discuss hospice philosophy and services. Those present are in agreement and have elected hospice services.     Reviewed case with [facility] provider Dr. Kerrin Champagne. [name] meets criteria for acute inpatient hospice due to need for frequent SN assessments and parenteral medications for symptom management. Spoke with [facility] provider Elroy Channel, NP re: patient's admission to hospice and need for signed and held orders.  Spoke with [facility] RN Jinny Blossom re: patient's admission to hospice and need to release held orders.  This admission was approved by [facility] medical director, Dr. Floyde Parkins.    Spoke with [facility] Chaplain service, will visit with patient and his daughter.  Pt's daughter very tearful at bedside, states "I don't know if I can do this again".      Physical assessment: Pt lying in bed, agitated, tearful, unintelligible speech, confused, brows furled.  Pt's eyes open but do not track.  RR 14 on RA.  HR 86, irregular, apical.  PAINAD=2.  Lungs diminished throughout all lung fields anterior.  Radial pulses faint/1+.  No edema appreciated.  LBM 02/03/2018.  Hypoactive bowel sounds.  Abdomen soft and non-tender.  Colostomy RLQ.  Pt received PO PRN Haldol X2 and IV PRN Ativan X1 since midnight.    Coordinated with: [facility] medical director Dr. Kerrin Champagne, Elroy Channel NP, [place] RN    Spoke with: Admitting - Jamas Lav    Thank you all for your compassionate care of this patient and family.    [facility] remains GIP Hospice appropriate due to requiring frequent nursing assessment, IV/Sub Q medications, symptom management needs, and/or decline or loss of oral route.      Linward Headland, MS, RN, CMSRN, [place], [facility], [place]  Personal cell: [phone]  Office: [phone]  After hours (after 4:30pm and weekends): [phone]

## 2018-01-31 NOTE — Discharge Summary (Signed)
Name: Victor Flowers MRN: [id number] DOB: [birth year]     Admit Date: 01/09/2018   Date of Discharge: 02/04/2018     Patient was accepted for discharge to   To Hospice/Medical Facility [[id number]]           Discharge Attending Physician: Sharmon Leyden, MD      Hospitalization Summary    CONCISE NARRATIVE: 72 year old male with PMH significant for CKD V on HD, FAP with ostomy [name] [phone]), type II DM (now well controlled), Barrett;s esophagus, HTN, prostate cancer with TURP presented with severe hyperkalemia- K 9.5 on admit with EKG changes- due to missed HD--course complicated byherpes zoster ophthalmicus left eye and forehead, failure to thrive.  Family opted for comfort care. Pt w/ progressive decline, meds switched to [facility] route on 6/22. Enrolled w/ [facility] on 6/22            ULTRASOUND RESULTS:   5/31: RUE ultrasound:Right upper extremity arteriovenous graft with a severe stenosis 10cm beyond the anastomosis, near the origin of the stent grafts.            OTHER IMAGING RESULTS:   6/4 fistulagram:Fistulogram demonstrating interval areas of stenoses, as described above. Most of them were successfully angioplastied, however there is a focal area of focal extrinsic compression, which correlates with a palpable hard abnormality is near   the distal dialysis site, which may represent scar tissue.                   Signed: Elroy Channel, NP  On: 01/14/2018  at: 12:02 PM

## 2018-01-31 NOTE — Progress Notes (Signed)
Report Given To  Patrina Levering, RN        Descriptive Sentence / Reason for Admission   Victor Flowers is a 72 y.o. male  w/ PMH ESRD on HD, CAD, HTN, T2DM, prostate ca s/p TURP, FAP s/p subtotal colectomy presented with severe hyperkalemia- K 9.5 in the setting of missed dialysis.  Course complicated by Herpes Zoster Opth  almicus- comfort care on 6/19.  Meds switched to [facility] route on 6/22. Enrolled w/ [facility]pt [facility] on 6/22          Active Issues / Relevant Events   NPN 0700-1900  Mikki Santee was calling out a few times this morning for ice, which he really enjoys. He became a little anxious when writer told him she'd be right back after she checked on other patients to which he shouted "no! Don't leave." He soon fell asleep and writer left and upon returning a bit later he was sound asleep. Around 11AM when his daughter arrived he became upset, crying and shouting "where you been?!" and was restless. PRN Haldol and PRN Ativan given with great effect and he slept peacefully for several hours. PO meds changed to SQ and Mikki Santee started on ATC Dilaudid and Haldol. Ileostomy intact and draining loose stool, pt is anuric so no UOP. No visitors this afternoon. Pt signed onto hospice.        To Do List          Anticipatory Guidance / Discharge Planning

## 2018-02-01 DIAGNOSIS — B029 Zoster without complications: Secondary | ICD-10-CM

## 2018-02-01 DIAGNOSIS — N186 End stage renal disease: Secondary | ICD-10-CM

## 2018-02-01 DIAGNOSIS — R51 Headache: Secondary | ICD-10-CM

## 2018-02-01 DIAGNOSIS — F419 Anxiety disorder, unspecified: Secondary | ICD-10-CM

## 2018-02-01 DIAGNOSIS — R5381 Other malaise: Secondary | ICD-10-CM

## 2018-02-01 DIAGNOSIS — R410 Disorientation, unspecified: Secondary | ICD-10-CM

## 2018-02-01 DIAGNOSIS — R0600 Dyspnea, unspecified: Secondary | ICD-10-CM

## 2018-02-01 NOTE — Plan of Care (Signed)
Comfort    • Without pain, discomfort, nausea, restlessness, Maintaining        Genitourinary    • Absence of urinary retention with optimal Maintaining        Musculoskeletal    • Optimal patient comfort and mobility; participate in ADLs Maintaining        Neurologic:Cognitive functioning    • Optimal cognitive functioning within comfort parameters Maintaining        Oral mucosa    • Maintain optimal oral mucosa moisture for comfort Maintaining        Psychosocial    • Patient/family participate in care Maintaining        Respiratory    • Respiratory rate within comfort parameters Maintaining        Safety and environment    • Without injury or discomfort related to risk factors Maintaining        Skin / Wound    • Maintains integrity of skin Maintaining        Skin integrity of IV or subcutaneous site    • Subcutaneous or IV insertion site without infiltration or re Maintaining

## 2018-02-01 NOTE — Progress Notes (Addendum)
Palliative Care Progress Note    HPI: 72 yo M w/ familial adenomatous polyposis with ostomy [name] pouch), DM type II, Barretts esophagus, HTN, prostate cancer s/p TURP , ESRD on HD admited 5/31 with severe hyperkalemia with missed HD developed herpes zoster opthalmicus and encephalopathy with family making decision 6/19 to transition to comfort measures only and transfer to 41200 for end of life care.    Subjective: unresponsive    Palliative Care ROS:  Unable to Respond   yes    Physical Examination:   Resp:  [10-16]    General: drowsy, does not follow request to open mouth but accepted swab  Lungs: easy resp, RA  Heart: reg reg  Throat: no exudates, dry pink  Abd: soft, ostomy  Extrem: no edema  Skin: HSV rash w/ dried drainage around L eye  Neuro: minimally responsive this AM,  difficulty taking sm amt w/ PO meds, very weak    Assessment/Plan:  72 yo M w/ FAP w/ ostomy, admitted w/ hyperkalemia after missed HD and encephalopathy in setting of HSV opthalmicus. Pt now CMO plan and rapidly declining, minimally responsive and no longer safe for PO. Plan to enroll w/ [facility].    Pain/dyspnea   Dilaudid 0.25 mg SC q 4 hrs ATC (patient calm/ no sx pain or SOB today)   & dilaudid 0.5 mg PRN SC (no PRN needed in 24 hr)    Valacyclovir 500 mg po daily for HSV as comfort measure -leave ordered if able to take any PO- will d/c today as not able to give any oral meds.    Dilaudid 0.5 mg SC Q 30 min prn (x 1 on 6/21) -change to Q 15 min prn    Anxiety/agitation/nausea/insomnia  Haldol 1 mg SC Q 6 hrs since 6/22 with relief of agitation    Haldol 1 mg SC Q 3 hrs prn  Lorazepam 1 mg SC Q 30 min prn    Airway secretions/eye care/skin care  Refresh eye gtts Q 4 hrs  Bacitracin oint BID to scalp  Erythromycin opth oint TID L eye  Timolol opth 1 gtt BID L eye  Trifluridine opth 1 gtt Q 4 hrs L eye  Robinul 0.4 mg SC Q 4 hrs prn    Prevention of constipation, last stool 6/22 (ostomy)  Bisacodyl supp pr daily  prn    GOC/prognosis  DNR/DNI, CMO  Dtr [name] is NOK and surrogate decision maker [phone]) -wants to do POA, SW aware  Prognosis days to week  Dtr met w/ [facility] , now inpt hospice    Conferred with nursing re plan      Total Time Spent 20 minutes:   >50% of time was spent in counseling and/or coordination of care.     Eino Farber NP  Palliative Care Consult service  Pager [phone]    PALLIATIVE CARE ATTENDING    I had face-to-face interaction with the patient today, 02/01/2018 at 1439.   Reason for Visit: Facial pain; Herpes zoster ophthalmicus; Dyspnea; Delirium; End stage renal disease; Anxiety; Debility    Total Time Spent 10 minutes: (Combined Total Time Spent, including Judy Brustein, NP, 35 minutes)  >50% of time was spent in counseling and/or coordination of care.     Attestation: I have reviewed and agree with the 02/01/2018 documentation by Eino Farber, NP and, in addition, the history, exam, and plan are notable for the following:    HPI:  Comprehensive history, including Review of Systems, unobtainable secondary  to patient's mental status: delirium    Exam:  Lungs clear    Medical Decision Making and Plan:  Shingles: Left forehead lesions are healing. Discuss with ID when to discontinue contact precautions.  Debility: Position and turn for comfort and prevention of pressure sores.  Disposition: Enrolled in inpatient hospice yeterday.    AUTHOR: Sharmon Leyden, MD  _______________________________________________________________  Patient Active Problem List   Diagnosis Code    Vision disturbance H53.9    ESRD (end stage renal disease) on dialysis N18.6, Z99.2    DM type 2 (diabetes mellitus, type 2) E11.9    Chronic pain G89.29    Hyperkalemia E87.5    Barrett esophagus K22.70    BPH (benign prostatic hyperplasia) N40.0    Coronary atherosclerosis I25.10    HTN (hypertension) I10    Familial adenomatous polyposis D12.6    Generalized weakness R53.1    GERD (gastroesophageal  reflux disease) K21.9    Hyperlipidemia E78.5    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 Unclear severity. Per chart review, had a positive SRA    Lisinopril Other (See Comments)     Weakness, renal function changes       Scheduled Meds:    (artificial tears) polyvinyl alcohol  1 drop Left Eye Q4H    bacitracin   Topical 2 times per day    erythromycin   Left Eye 3 times per day    haloperidol lactate  1 mg Subcutaneous Q6H    HYDROmorphone PF  0.26 mg Subcutaneous Q4H    timolol  1 drop Left Eye 2 times per day    trifluridine  1 drop Left Eye Q4H    valACYclovir  500 mg Oral Daily       Continuous Infusions:       PRN Meds:  acetaminophen, bisacodyl, glycopyrrolate, haloperidol lactate, HYDROmorphone PF, LORazepam

## 2018-02-01 NOTE — Progress Notes (Signed)
Report Given To  Patsy Lager, RN        Descriptive Sentence / Reason for Admission   Victor Flowers is a 72 y.o. male  w/ PMH ESRD on HD, CAD, HTN, T2DM, prostate ca s/p TURP, FAP s/p subtotal colectomy presented with severe hyperkalemia- K 9.5 in the setting of missed dialysis.  Course complicated by Herpes Zoster Opth  almicus- comfort care on 6/19.  Meds switched to [facility] route on 6/22. Enrolled w/ [facility]pt [facility] on 6/22          Active Issues / Relevant Events   NPN 1900-0700  Victor Flowers has been sleeping for most of the evening and night with no indications of pain.  Eye drops given along with scheduled medications with good effect.  No additional PRNs needed.  Ileostomy intact and draining loose stool, pt is anuric so no urine output.       To Do List          Anticipatory Guidance / Discharge Planning

## 2018-02-01 NOTE — Progress Notes (Cosign Needed)
Report Given To  Patrina Levering, RN        Descriptive Sentence / Reason for Admission   Victor Flowers is a 72 y.o. male  w/ PMH ESRD on HD, CAD, HTN, T2DM, prostate ca s/p TURP, FAP s/p subtotal colectomy presented with severe hyperkalemia- K 9.5 in the setting of missed dialysis.  Course complicated by Herpes Zoster Opth  almicus- comfort care on 6/19.  Meds switched to [facility] route on 6/22. Enrolled w/ [facility]pt [facility] on 6/22          Active Issues / Relevant Events   NPN 0700-1900  Mikki Santee has been sleeping for most of the day with no complaints or signs of discomfort, therefore no prn's were given, he received all his eye drops as scheduled, he was turned and repositioned, he had no urine or BM, he has been comfortable, no family or visitors today      To Do List          Anticipatory Guidance / Discharge Planning

## 2018-02-02 ENCOUNTER — Ambulatory Visit: Payer: Self-pay | Admitting: Ophthalmology

## 2018-02-02 DIAGNOSIS — B0230 Zoster ocular disease, unspecified: Secondary | ICD-10-CM

## 2018-02-02 DIAGNOSIS — Z992 Dependence on renal dialysis: Secondary | ICD-10-CM

## 2018-02-02 MED ORDER — HYDROMORPHONE HCL 2 MG/ML IJ SOLN *WRAPPED*
0.2500 mg | INTRAMUSCULAR | Status: DC
Start: 2018-02-02 — End: 2018-02-02

## 2018-02-02 MED ORDER — ARTIFICIAL TEARS (POLYVINYL ALCOHOL) 1.4 % OP SOLN *I*
1.0000 [drp] | OPHTHALMIC | Status: DC
Start: 2018-02-02 — End: 2018-02-02
  Administered 2018-02-02: 1 [drp] via OPHTHALMIC

## 2018-02-02 MED ORDER — BACITRACIN-POLYMYXIN B 500-10000 UNIT/GM OP OINT *I*
TOPICAL_OINTMENT | OPHTHALMIC | Status: DC
Start: 2018-02-02 — End: 2018-02-04
  Filled 2018-02-02: qty 3.5

## 2018-02-02 MED ORDER — HYDROMORPHONE HCL PF 1 MG/ML IJ SOLN *WRAPPED*
0.5000 mg | INTRAMUSCULAR | Status: DC
Start: 2018-02-02 — End: 2018-02-04
  Administered 2018-02-02 – 2018-02-04 (×10): 0.5 mg via SUBCUTANEOUS
  Filled 2018-02-02 (×10): qty 1

## 2018-02-02 NOTE — Progress Notes (Signed)
[place] pt who was nonresponsive to writer's voice.  Pt appeared to be resting comfortably; his face was relaxed. Writer aware that pt is Nurse, learning disability recited Psalm 23 at pt's bedside. Offered silent prayer and a blessing for pt.      Care coordination: Deeann Dowse, Hospice CM    [facility] chaplain  Cell:  [phone]

## 2018-02-02 NOTE — Consults (Addendum)
Ophthalmology Consult      Patient name: Victor Flowers  DOB: 04/10/1946       Age: 72 y.o.  MR#: [id number]    Subjective:     Reason for Consult: VZV ophthalmicus    Consulting service: medicine    Pt with previous blurry vision, hx of glaucoma, presented this time for VZV, significant scalp lesions and blurry vision.     Past Ocular History: Uncertain history of pthisis OD. PCIOL OS. Prior Tab 2018.     Today pt is not responsive, and pt has been refusing PO.     Current Facility-Administered Medications:     bacitracin-polymyxin b (POLYSPORIN) ophthalmic ointment, , Left Eye, Q4H, Khrystal Jeanmarie, MD    HYDROmorphone PF (DILAUDID) injection 0.5 mg, 0.5 mg, Subcutaneous, Q4H, Melvern Banker, NP, 0.5 mg at 02/02/18 1230    acetaminophen (TYLENOL) suppository 650 mg, 650 mg, Rectal, Q4H PRN, Syrett, Ann, NP    bisacodyl (DULCOLAX) suppository 10 mg, 10 mg, Rectal, Daily PRN, Syrett, Ann, NP    glycopyrrolate (ROBINUL) injection 0.4 mg, 0.4 mg, Subcutaneous, Q4H PRN, Syrett, Ann, NP    haloperidol lactate (HALDOL) injection 1 mg, 1 mg, Subcutaneous, Q3H PRN, Syrett, Ann, NP    haloperidol lactate (HALDOL) injection 1 mg, 1 mg, Subcutaneous, Q6H, Syrett, Ann, NP, 1 mg at 02/02/18 1224    HYDROmorphone (DILAUDID) injection 0.5 mg, 0.5 mg, Subcutaneous, Q15 Min PRN, Syrett, Ann, NP    LORazepam (ATIVAN) 2 mg/mL injection 1 mg, 1 mg, Subcutaneous, Q30 Min PRN, Syrett, Ann, NP, 1 mg at 02/02/18 1056    trifluridine (VIROPTIC) 1 % ophthalmic solution 1 drop, 1 drop, Left Eye, Q4H, Syrett, Ann, NP, 1 drop at 02/02/18 1233  Allergies: Metformin; Bactrim [sulfamethoxazole-trimethoprim]; Heparin; and Lisinopril     Medical History:   Past Medical History:   Diagnosis Date    Barrett's esophagus     BPH (benign prostatic hyperplasia)     ESRD needing dialysis     FAP (familial adenomatous polyposis)     HLD (hyperlipidemia)     HTN (hypertension)         Surgical History:   Past Surgical History:   Procedure  Laterality Date    IR ANGIOGRAPHY AV DIALYSIS SHUNT N/A 01/13/2018    IR ANGIOGRAPHY AV DIALYSIS SHUNT 01/13/2018 Alcide Clever, MD [facility] IMG IR/ANG    IR ANGIOGRAPHY AV DIALYSIS SHUNT N/A 01/26/2018    IR ANGIOGRAPHY AV DIALYSIS SHUNT 01/26/2018 Cantos, Marca Ancona, MD [facility] IMG IR/ANG    PR TEMPORAL ARTERY LIGATN OR BX N/A 03/28/2017    Procedure: TEMPORAL ARTERY BIOPSY;  Surgeon: Anselmo Rod, MD;  Location: [facility] MAIN OR;  Service: Ophthalmology          Objective:     Base Eye Exam     Visual Acuity       Right Left    Near cc u/a u/a          Tonometry (Tonopen, 10:52 AM)       Right Left    Pressure 6 9          Pupils       React    Right     Left Brisk          Neuro/Psych     Oriented x3:  Yes    Mood/Affect:  Normal            Slit Lamp and Fundus Exam     External Exam  Right Left    External pthisical.  many crusted lesions V1 distribution involving nose. Large discolored lesion 4cm on scalp.          Slit Lamp Exam       Right Left    Lids/Lashes normal.  swollen with few crusted lid lesions    Conjunctiva/Sclera wnl.  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 OK to discontinue.  -Continue viroptic gtts OS 5 times a day for 7 days   -warm compresses over lesions of face.  -Stop Erythromycin TID in OS and over all facial / eyelid lesions.   - start AK poly every 4 hours in left eye and scalp   -Derm consult for skin lesion of medial scalp.   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    Authored by Erlinda Hong Arleigh Odowd, MD on 02/02/2018 at 1:07 PM

## 2018-02-02 NOTE — Progress Notes (Signed)
Report Given To  Sherryle Lis, RN        Descriptive Sentence / Reason for Admission   CHRIST FULLENWIDER is a 72 y.o. male  w/ PMH ESRD on HD, CAD, HTN, T2DM, prostate ca s/p TURP, FAP s/p subtotal colectomy presented with severe hyperkalemia- K 9.5 in the setting of missed dialysis.  Course complicated by Herpes Zoster Opth  almicus- comfort care on 6/19.  Meds switched to [facility] route on 6/22. [facility]          Active Issues / Relevant Events   NPN 1900-0700  Mikki Santee has been sleeping for most of the evening and night with no indications of pain.  Eye drops given along with scheduled medications with good effect.  No additional PRNs needed.  Ileostomy intact and draining loose stool, pt is anuric so no urine output.   Patient got a bath.            To Do List          Anticipatory Guidance / Discharge Planning

## 2018-02-02 NOTE — Progress Notes (Signed)
Writer spoke with Rayburn Ma at [facility] regarding the outpatient dialysis slot.  Writer advised that patient has transitioned to comfort care and no longer will require the outpatient slot.  Rayburn Ma to update the clinics and close out the referral.    Darlyn Chamber, [place]

## 2018-02-02 NOTE — Plan of Care (Signed)
Genitourinary    • Absence of urinary retention with optimal Maintaining        Musculoskeletal    • Optimal patient comfort and mobility; participate in ADLs Maintaining        Neurologic:Cognitive functioning    • Optimal cognitive functioning within comfort parameters Maintaining        Oral mucosa    • Maintain optimal oral mucosa moisture for comfort Maintaining        Psychosocial    • Patient/family participate in care Maintaining        Respiratory    • Respiratory rate within comfort parameters Maintaining        Safety and environment    • Without injury or discomfort related to risk factors Maintaining        Skin / Wound    • Maintains integrity of skin Maintaining        Skin integrity of IV or subcutaneous site    • Subcutaneous or IV insertion site without infiltration or re Maintaining          Comfort    • Without pain, discomfort, nausea, restlessness, Progressing towards goal

## 2018-02-02 NOTE — Plan of Care (Signed)
Comfort    • Without pain, discomfort, nausea, restlessness, Maintaining        Genitourinary    • Absence of urinary retention with optimal Maintaining        Musculoskeletal    • Optimal patient comfort and mobility; participate in ADLs Maintaining        Neurologic:Cognitive functioning    • Optimal cognitive functioning within comfort parameters Maintaining        Oral mucosa    • Maintain optimal oral mucosa moisture for comfort Maintaining        Psychosocial    • Patient/family participate in care Maintaining        Respiratory    • Respiratory rate within comfort parameters Maintaining        Safety and environment    • Without injury or discomfort related to risk factors Maintaining        Skin / Wound    • Maintains integrity of skin Maintaining        Skin integrity of IV or subcutaneous site    • Subcutaneous or IV insertion site without infiltration or re Maintaining

## 2018-02-02 NOTE — Progress Notes (Signed)
Report Given To  Patrina Levering, RN        Descriptive Sentence / Reason for Admission   BRANT PEETS is a 72 y.o. male  w/ PMH ESRD on HD, CAD, HTN, T2DM, prostate ca s/p TURP, FAP s/p subtotal colectomy presented with severe hyperkalemia- K 9.5 in the setting of missed dialysis.  Course complicated by Herpes Zoster Opth  almicus- comfort care on 6/19.  Meds switched to [facility] route on 6/22. [facility]/ [facility] 6/[facility]          Active Issues / Relevant Events   NPN 0700-1900  Mikki Santee is minimally responsive and not interactive. He stirs occasionally, moving side to side in bed. He became quite restless and moaned so was given PRN Ativan with great effect. Anuric. Ileostomy bag intact and draining a large amount of loose stool. No visitors currently at bedside.        To Do List          Anticipatory Guidance / Discharge Planning

## 2018-02-02 NOTE — Progress Notes (Signed)
[facility]    Physical assessment: patient lying in bed, eyes closed and non-responsive to voice or touch.  Face appears calm with PAINAD=0.  RR 12, easy WOB.  Lungs CTA.  HR 80, apical irregular and faint.  Radial and Pedal pulses 1+.  Abdomen soft and non-tender with absent bowel sounds.  Skin cool to touch with no discoloration or mottling observed.    Comfort meds in past 24 hours:  haloperidol lactate 1 mg EVERY 6 HOURS SC   HYDROmorphone PF 0.5 mg EVERY 4 HOURS SC  LORazepam 2 mg/mL 1 mg EVERY 30 MIN PRN SC x 1    Patient's sister, Donalynn Furlong at bedside.  Answered her questions and provided emotional support.    Pt remains appropriate for acute inpatient hospice for parenteral management of symptoms and need for frequent nursing assessments and attentive care.    [facility]  Cell: [phone]  Office: [phone]  After Hours: [phone]

## 2018-02-02 NOTE — Progress Notes (Addendum)
Palliative Care Progress Note  HPI: 72 yo M w/ familial adenomatous polyposiswith ostomy (Koch pouch), DM type II, Barretts esophagus, HTN, prostate cancer s/p TURP , ESRD on HD admited 5/31 with severe hyperkalemia with missed HD developed herpes zoster opthalmicus and encephalopathy with family making decision 6/19to transition to comfort measures only and transfer to 41200 for end of life care.    Subjective: unresponsive    Objective: eyes wide open sleeping, when roused with touch and voice he let out yell, appeared distressed, turned over and within short period again sleeping,  Trying to climb out of bed for nursing this am.  Not able take po.        Prn Medications Used in Last 24 Hours:   No prn    Palliative Care ROS:  Unable to Respond   yes    Physical Examination:   Resp:  [18-28]   General: sleeping with eyes open, roused with agitation ? Discomfort then sleeping in short time  Lungs: easy resp, RA, lung sounds clear  Heart: RRR  Throat: no exudates, dry pink  Abd: soft, ostomy  Extrem: no edema  Skin: HSV rash w/ left eye drainage, less erythema  Neuro: minimally responsive this AM,  difficulty taking sm amt w/ PO meds, very weak    Assessment/Plan:    This am with increased agitation, receiving prn Haldol.  With difficulty communicating if having pain will increase hydromorphone dose from 0.25 mg to 0.5 mg.  If uses frequent prn Haldol would increase frequency and perhaps dose of standing Haldol.      Pain/dyspnea:   Increase Hydromorphone to 0.5 mg sq q 4 rh ATC and 0.5 mg sq q 15 min  prn    Anxiety/Agitation/Nausea/Insomnia  Haldol 1 mg sq q 6 hr and q 3 hr prn  Lorazepam 1 mg sq q 30 min prn    Airway secretions/eye care/skinn care  Refresh eye gtts q 4 hr   Bacitracin ointment bid to scalp  Erythromycin oth oint tid to left eye  Timolol oth 1 gtt bid left eye  Trifluridine opth 1 gtt q 4 hr left eye     Elimination: ostomy with daily output  Indwelling foley catheter    GOC/Prognosis:    DNr/DNI/CMO  Victor Flowers is next of kin  Prognosis days to week  Inpatient hospice appropriate with need for parenteral medication for symptom management and frequent nursing assessment and intervention.     Case D/W nursing and attending    Total Time Spent 35 minutes:   >50% of time was spent in counseling and/or coordination of care.     ____Elaine Drue Flirt NP on 02/02/2018 at 11:31 AM.             Palliative Care Attending:  I saw and evaluated the patient. I agree with the NP findings and plan of care as documented below.    To summarize, Victor Flowers is minimally responsive, roused to touch and orients to voice but no clear responses to questions. Sleeping with eyes open. Had episode of grimacing and restlessness trying to sit up in bed pulling on bedrail. Unable to take PO.   Given potential for pain related to herpes zoster, will increase dilaudid to 0.5mg  q4h atc. Continue haldol 1mg  q6h and prn. Will consolidate eye regimen to only essentials for VZV ophthalmicus and symptom management.    Last HD session 6/19. Prognosis likely day(s). Expected to remain for EOL care and parenteral symptom management. On hospice.  Total time spent: 35 minutes  >50% in counseling and coordination of care    Victor Sandstrom W Hayde Kilgour, MD    ___________________________________________________________  Patient Active Problem List   Diagnosis Code    Vision disturbance H53.9    ESRD (end stage renal disease) on dialysis N18.6, Z99.2    DM type 2 (diabetes mellitus, type 2) E11.9    Chronic pain G89.29    Hyperkalemia E87.5    Barrett esophagus K22.70    BPH (benign prostatic hyperplasia) N40.0    Coronary atherosclerosis I25.10    HTN (hypertension) I10    Familial adenomatous polyposis D12.6    Generalized weakness R53.1    GERD (gastroesophageal reflux disease) K21.9    Hyperlipidemia E78.5    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 Unclear severity. Per chart review, had a positive SRA    Lisinopril Other (See Comments)     Weakness, renal function changes       Scheduled Meds:    HYDROmorphone PF  0.25 mg Subcutaneous Q4H    (artificial tears) polyvinyl alcohol  1 drop Left Eye Q4H    bacitracin   Topical 2 times per day    erythromycin   Left Eye 3 times per day    haloperidol lactate  1 mg Subcutaneous Q6H    timolol  1 drop Left Eye 2 times per day    trifluridine  1 drop Left Eye Q4H       Continuous Infusions:       PRN Meds:  acetaminophen, bisacodyl, glycopyrrolate, haloperidol lactate, HYDROmorphone PF, LORazepam

## 2018-02-02 NOTE — Interdisciplinary Rounds (Addendum)
Pall Care Interdisciplinary Rounds note:    Date: 01/30/18      Disciplines present: Nurse Practitioner, Physician and Registered Nurse, hospice RN, hospice SW  Prognosis: Few to several days  Dispo: EOL on 4-12  Symptom Management: Confused, restless, eating very little. Ointment and Abx for herpes zoster. See provider note for details of symptom management.  Spiritual Needs: Chaplaincy following  Hospice (if appropriate): Hospice referral.  Hospice to meet with daughter on Saturday    Date: 02/02/18    Disciplines present: Nurse Practitioner, Physician and Registered Nurse, hospice RN, hospice SW  Updates: Not interactive. Eye still draining. Restless at times.  SIgned onto hospice 6/23.  See provider note for details of symptom management.    Date: 02/03/18    Disciplines present: NP, resident, fellow, SW, RN  Updates: Andre Lefort. Ongoing support to family. See provider note for details of symptom management plan

## 2018-02-03 MED ORDER — ARTIFICIAL SALIVA MT SOLN *WRAPPED*
2.5000 mL | Status: DC | PRN
Start: 2018-02-03 — End: 2018-02-04
  Administered 2018-02-03: 2.5 mL via ORAL
  Filled 2018-02-03: qty 44.3

## 2018-02-03 MED ORDER — TRIFLURIDINE 1 % OP SOLN *I*
1.0000 [drp] | OPHTHALMIC | Status: DC
Start: 2018-02-03 — End: 2018-02-04
  Administered 2018-02-03 – 2018-02-04 (×4): 1 [drp] via OPHTHALMIC

## 2018-02-03 MED ORDER — LORAZEPAM 2 MG/ML IJ SOLN *I*
1.0000 mg | INTRAMUSCULAR | Status: DC | PRN
Start: 2018-02-03 — End: 2018-02-04

## 2018-02-03 MED ORDER — GLYCOPYRROLATE 0.2 MG/ML IJ SOLN *WRAPPED*
0.4000 mg | INTRAMUSCULAR | Status: DC | PRN
Start: 2018-02-03 — End: 2018-02-04

## 2018-02-03 NOTE — Plan of Care (Signed)
Comfort    • Without pain, discomfort, nausea, restlessness, Maintaining        Genitourinary    • Absence of urinary retention with optimal Maintaining        Musculoskeletal    • Optimal patient comfort and mobility; participate in ADLs Maintaining        Neurologic:Cognitive functioning    • Optimal cognitive functioning within comfort parameters Maintaining        Oral mucosa    • Maintain optimal oral mucosa moisture for comfort Maintaining        Psychosocial    • Patient/family participate in care Maintaining        Respiratory    • Respiratory rate within comfort parameters Maintaining        Safety and environment    • Without injury or discomfort related to risk factors Maintaining        Skin / Wound    • Maintains integrity of skin Maintaining        Skin integrity of IV or subcutaneous site    • Subcutaneous or IV insertion site without infiltration or re Maintaining

## 2018-02-03 NOTE — Plan of Care (Addendum)
Comfort     Without pain, discomfort, nausea, restlessness, Maintaining        Genitourinary     Absence of urinary retention with optimal Maintaining        Musculoskeletal     Optimal patient comfort and mobility; participate in ADLs Maintaining        Neurologic:Cognitive functioning     Optimal cognitive functioning within comfort parameters Maintaining        Oral mucosa     Maintain optimal oral mucosa moisture for comfort Maintaining        Psychosocial     Patient/family participate in care Maintaining        Respiratory     Respiratory rate within comfort parameters Maintaining        Safety and environment     Without injury or discomfort related to risk factors Maintaining        Skin / Wound     Maintains integrity of skin Maintaining        Skin integrity of IV or subcutaneous site     Subcutaneous or IV insertion site without infiltration or re Maintaining        NPN 7am-1530: Has appeared comfortable, withdrawn, not interactive, eyes open with no eye contact  but was moving in bed--had repositioned himself onto his L side unassisted. However, at 1400 this afternoon he did not react when repositioned. Eyes open with no eye contact or tracking. R's unlabored, rate decreased to 8/min from 10-12. Ostomy bag with only a little output. Anuric. Remains on isolation shingles precaution.  L eye without drainage after eye ointment had been wiped away. R eye with small amount of drainage.  Lesions over eyelids, forehead, and top of head are dry.

## 2018-02-03 NOTE — Progress Notes (Signed)
Report Given To  Sharl Ma, RN         Descriptive Sentence / Reason for Admission   Victor Flowers is a 72 y.o. male  w/ PMH ESRD on HD, CAD, HTN, T2DM, prostate ca s/p TURP, FAP s/p subtotal colectomy presented with severe hyperkalemia- K 9.5 in the setting of missed dialysis.  Course complicated by Herpes Zoster Opth  almicus- comfort care on 6/19.  Meds switched to [facility] route on 6/22. [facility]          Active Issues / Relevant Events   NPN 1500-2330: Victor Flowers has been minimally interactive.  He did not appear to be in any pain or discomfort, therefore no additional PRNs given.  RR 7-10 on evening shift with long apneic periods. Isolation precautions have been lifted per Racheal Patches, NP.   Resting comfortably, will continue to monitor.       To Do List          Anticipatory Guidance / Discharge Planning

## 2018-02-03 NOTE — Plan of Care (Signed)
Genitourinary    • Absence of urinary retention with optimal Maintaining        Musculoskeletal    • Optimal patient comfort and mobility; participate in ADLs Maintaining        Neurologic:Cognitive functioning    • Optimal cognitive functioning within comfort parameters Maintaining        Psychosocial    • Patient/family participate in care Maintaining        Respiratory    • Respiratory rate within comfort parameters Maintaining        Safety and environment    • Without injury or discomfort related to risk factors Maintaining        Skin / Wound    • Maintains integrity of skin Maintaining        Skin integrity of IV or subcutaneous site    • Subcutaneous or IV insertion site without infiltration or re Maintaining          Comfort    • Without pain, discomfort, nausea, restlessness, Progressing towards goal        Oral mucosa    • Maintain optimal oral mucosa moisture for comfort Progressing towards goal

## 2018-02-03 NOTE — Progress Notes (Signed)
Report Given To  Warrick Parisian, RN        Descriptive Sentence / Reason for Admission   Victor Flowers is a 72 y.o. male  w/ PMH ESRD on HD, CAD, HTN, T2DM, prostate ca s/p TURP, FAP s/p subtotal colectomy presented with severe hyperkalemia- K 9.5 in the setting of missed dialysis.  Course complicated by Herpes Zoster Opth  almicus- comfort care on 6/19.  Meds switched to [facility] route on 6/22. [facility]          Active Issues / Relevant Events   NPN 1900-0700  Victor Flowers has been sleeping for most of the evening and night with no indications of pain.  Eye drops given along with scheduled Dilaudid and Haldol with good effect.  No additional PRNs needed.  Patient's sister visiting for the evening.  Ileostomy intact and draining loose stool, pt is anuric so no urine output.   Patient got a shave and a bath.               To Do List          Anticipatory Guidance / Discharge Planning

## 2018-02-03 NOTE — Student Note (Signed)
Palliative Care Progress Note    Subjective:   72 yo M w/ familial adenomatous polyposiswith ostomy (Koch pouch), DM type II, Barretts esophagus, HTN, prostate cancer s/p TURP , ESRD on HD admited 5/31 with severe hyperkalemia with missed HD developed herpes zoster opthalmicus and encephalopathy with family making decision 6/19to transition to comfort measures only and transfer to [facility]1200 for end of life care. 6/22 inpatient hospice care started.     Objective:   Resting/sleeping comfortably in bed. No response to voice, moving slightly to touch, easily settling back into rest. Periods of apnea observed.     PRN Medication last 24 hours: None    Palliative Care ROS:  Unable to Respond   yes    Physical Examination:   Resp:  [6-16]   General appearance: fatigued, no distress and pale  Head: Normocephalic, without obvious abnormality, atraumatic  Lungs: clear to auscultation bilaterally  Heart: regular rate and rhythm, S1, S2 normal, no murmur, click, rub or gallop  Abdomen: Soft, Ostomy intact, hypoactive bowel sounds  Extremities: extremities normal, atraumatic, no cyanosis or edema  Pulses: BUE 1+   Skin: Scabbed herpes zoster lesions L eye/scalp  Neurologic: Mental status: unresponsive, not interactive    Assessment/Plan:    Pain/agitation well controlled with scheduled Hydromorphone and Haldol. Orders for eye care updated to reflect recommendations from Opthalmology.     Pain/dyspnea:   Hydromorphone 0.5mg  q4h, 0.5mg  q15 minute PRN    Anxiety/Agitation  Haldol 1 mg sq q 6 hr and q 3 hr prn  Lorazepam 1 mg sq q 30 min prn    Airway secretions/eye care/skin care  Bacitracin ointment q4h left eye/scalp  Trifluridine opth 1 gtt q 4 hr left eye x 7 days  Warm compresses over lesions of face as needed for comfort     Elimination:   -Ostomy with daily output  -Indwelling foley catheter, anuric     GOC/Prognosis:   Comfort Measures Only- Inpatient Hospice   DNR/DNI  Daughter [name] is next of kin  Prognosis  days    This is a provider student note: This case was reviewed with Racheal Patches, NP, Palliative Care Attending and bedside RN.     Clayborn Milnes FNP-S    Total Time Spent 35 minutes:   >50% of time was spent in counseling and/or coordination of care.                       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  Unclear severity. Per chart review, had a positive SRA    Lisinopril Other (See Comments)     Weakness, renal function changes       Scheduled Meds:    trifluridine  1 drop Left Eye Q4H    bacitracin-polymyxin b   Left Eye Q4H    HYDROmorphone PF  0.5 mg Subcutaneous Q4H    haloperidol lactate  1 mg Subcutaneous Q6H       Continuous Infusions: None      PRN Meds:  glycopyrrolate, LORazepam, acetaminophen, bisacodyl, haloperidol lactate, HYDROmorphone PF

## 2018-02-03 NOTE — Progress Notes (Signed)
Palliative Care Progress Note  HPI: 72 yo M w/ familial adenomatous polyposiswith ostomy (Koch pouch), DM type II, Barretts esophagus, HTN, prostate cancer s/p TURP , ESRD on HD admited 5/31 with severe hyperkalemia with missed HD developed herpes zoster opthalmicus and encephalopathy with family making decision 6/19to transition to comfort measures only and transfer to [facility]1200 for end of life care.    Subjective: unresponsive    Objective: No further agitation since yesterday morning.  Remains unresponsive without evidence of pain.  Opthamology provided eval yesterday, appreciate recommendations.     Prn Medications Used in Last 24 Hours:   Ativan 1 mg sq once    Palliative Care ROS:  Unable to Respond   yes    Physical Examination:   Resp:  [18-28]   General: Sleeping soundly does not rouse with voice or light touch appears comfortable.    Lungs: periods of apnea with [facility] respiration lung sounds clear  Head: scabbed lesions over occiput and left side forehead, no exudate  Eye: left eye without discharge or erythema  Heart: RRR  Abd: soft, ostomy  Extrem: no edema  Neuro:does not rouse to voice or light touch    Assessment/Plan:    Comfort maintained with ATC haldol and Hydromorphone.  More peaceful with increase dose of hydromorphone.    Eye drops changed per opthamology recommendations.     Pain/dyspnea:   Hydromorphone to 0.5 mg sq q 4 rh ATC and 0.5 mg sq q 15 min  prn    Anxiety/Agitation/Nausea/Insomnia  Haldol 1 mg sq q 6 hr and q 3 hr prn  Lorazepam 1 mg sq q 30 min prn    Airway secretions/eye care/skinn care  Viroptic gtt left eye 5xx daily for 7 days  Polysporin opth ointment q 4 hr left eye     Elimination: ostomy with daily output  Indwelling foley catheter    GOC/Prognosis:   DNr/DNI/CMO  Daughter [name] is next of kin  Prognosis days to week  Inpatient hospice appropriate with need for parenteral medication for symptom management and frequent nursing assessment and intervention.      Case D/W nursing and attending    Total Time Spent 35 minutes:   >50% of time was spent in counseling and/or coordination of care.     ___Elaine Drue Flirt NP on 02/03/2018 at 9:32 AM.         .  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 Unclear severity. Per chart review, had a positive SRA  Lisinopril Other (See Comments)     Weakness, renal function changes       Scheduled Meds:    trifluridine  1 drop Left Eye Q4H    bacitracin-polymyxin b   Left Eye Q4H    HYDROmorphone PF  0.5 mg Subcutaneous Q4H    haloperidol lactate  1 mg Subcutaneous Q6H       Continuous Infusions:       PRN Meds:  glycopyrrolate, LORazepam, acetaminophen, bisacodyl, haloperidol lactate, HYDROmorphone PF

## 2018-02-03 NOTE — Progress Notes (Signed)
Visit to pt, non responsive to name and gentle touch.  Respirations 18 w/ increased slight  WOB but comfortable.  No family in room at time of visit.    Remains in pt appropriate d/t frequent nursing interventions and SQ meds to maintain comfort.   Thank you for your compassionate care of this man.    Beatris Si RN

## 2018-02-04 NOTE — Significant Event (Signed)
RN Death Pronouncement Note   Date & time: 01/19/2018 2:38 AM  Patient identified as KAIEA ESSELMAN via wrist band.   No response to stimuli. No respirations. No heart sound.    Family: ABSENT  Time of death: 0207  Next of Kin: [name], daughter  Name of nurse verifying above assessment: Excell Seltzer, RN  Name of second nurse confirming assessment:  Harrell Gave Painting RN  Name of provider notified: Lacey Jensen, PA  Time provider notified: [phone]    Marzetta Board declined autopsy.

## 2018-02-04 NOTE — Discharge Summary (Signed)
Name: Victor Flowers MRN: [id number] DOB: [birth year]     Admit Date: 02/27/18    Date of Death: 03-Mar-2018    Patient was accepted for discharge to   Patient Expired [20]                  Hospitalization Summary    CONCISE NARRATIVE:     Victor Flowers a 72 year old male with PMH significant for CKD V on HD, FAP with ostomy [name] [phone]), type II DM (now well controlled), Barrett;s esophagus, HTN, prostate cancer with TURP presented with severe hyperkalemia- K 9.5 on admit with EKG changes- due to missed HD--course complicated byherpes zoster ophthalmicus left eye and forehead, failure to thrive.  Family opted for comfort care. Pt w/ progressive decline, meds switched to SC route on 02-28-2023. Enrolled w/ [facility] on February 28, 2023  Patients symptoms were managed with the goal of comfort. Medications were reviewed and those that were not consistent with end of life and comfort have been discontinued. In patient  Hospice was implement. He died on Mar 03, 2018 at 0207. Follow up bereavement will be implemented from the Hospice agency.                                           Signed: Wallace Keller, NP  On: March 03, 2018  at: 7:23 AM

## 2018-02-04 NOTE — Progress Notes (Signed)
Writer assumed care of patient 2300- official TOD at 0207. Two RN's (see note) pronounced patient. Family (sister, [name], sister, Vaughan Basta, & daughter, Marzetta Board) were all notified via telephone and declined to come in to hospital. They are aware of next steps. Patient had no belongings that need to be picked up by family. Body prepared per policy & transport requested to morgue.    Sharl Ma, RN

## 2018-02-09 DEATH — deceased

## 2018-02-10 LAB — EKG 12-LEAD
P: -75 deg
PR: 209 ms
QRS: -73 deg
QRS: -76 deg
QRSD: 162 ms
QRSD: 196 ms
QT: 452 ms
QT: 550 ms
QTc: 433 ms
QTc: 449 ms
Rate: 40 {beats}/min
Rate: 55 {beats}/min
T: -78 deg
T: -84 deg

## 2018-02-13 ENCOUNTER — Ambulatory Visit: Payer: Medicare (Managed Care) | Admitting: Surgery

## 2018-03-17 IMAGING — US US SCROTUM W/ DOPPLER COMPLETE
1 series · 13 of 25 positions shown · non-contrast
Comparison: None.

CLINICAL DATA: Scrotal pain for 2 days.

EXAM:
SCROTAL ULTRASOUND
DOPPLER ULTRASOUND OF THE TESTICLES
TECHNIQUE: Complete ultrasound examination of the testicles, epididymis, and
other scrotal structures was performed. Color and spectral Doppler
ultrasound were also utilized to evaluate blood flow to the
testicles.

[Series 1: us scrotum w/ doppler complete · 0.07mm/px · 13 of 46 slices shown]
[im 1/46]
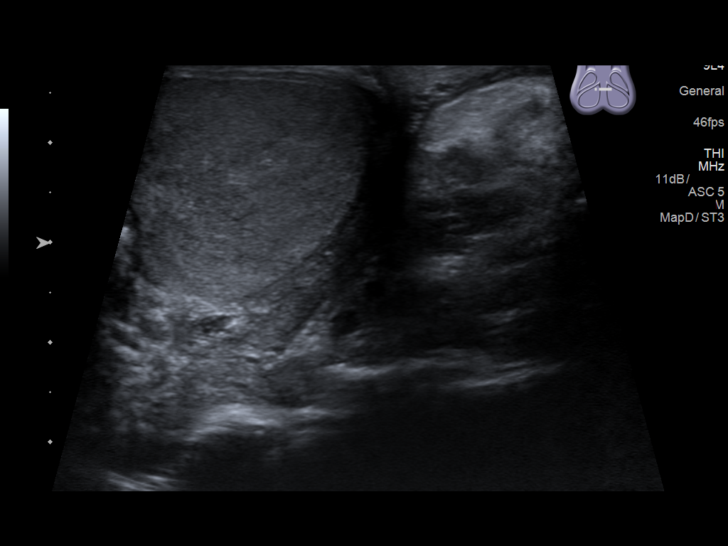
[im 4/46]
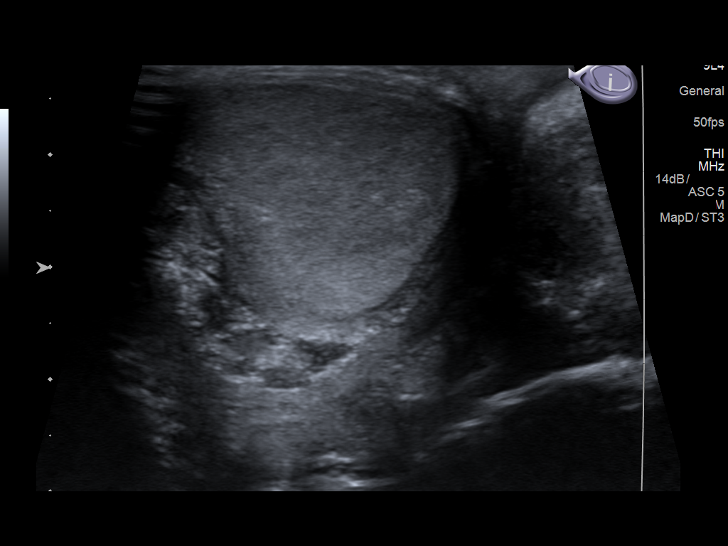
[im 8/46]
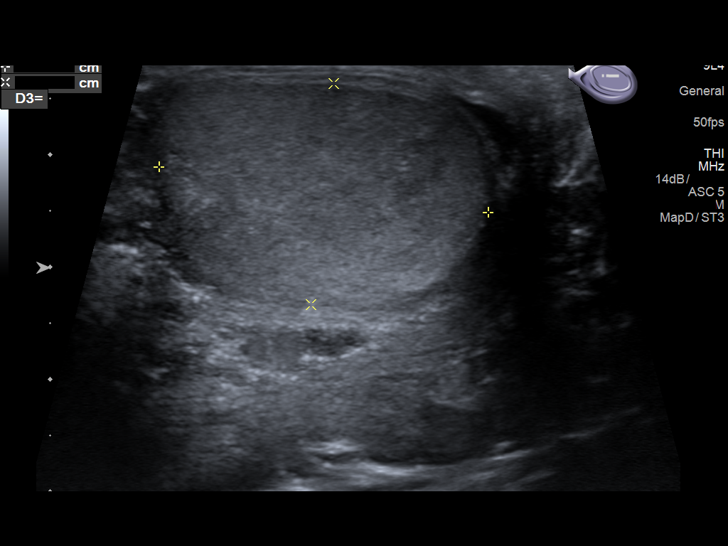
[im 12/46]
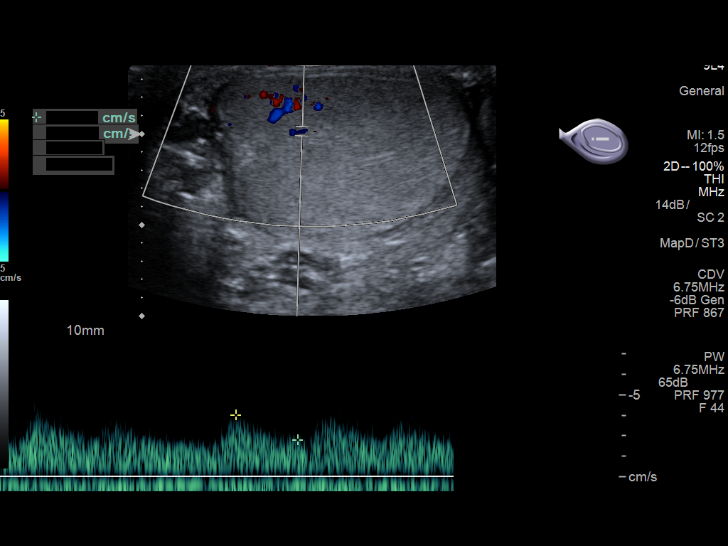
[im 16/46]
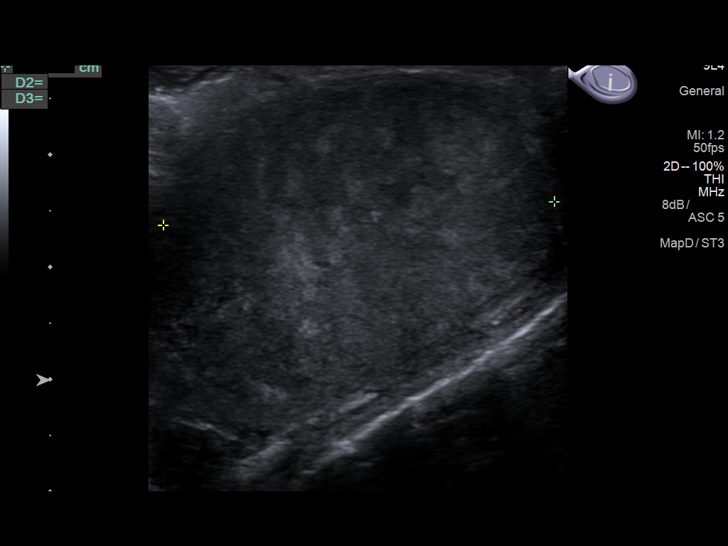
[im 19/46]
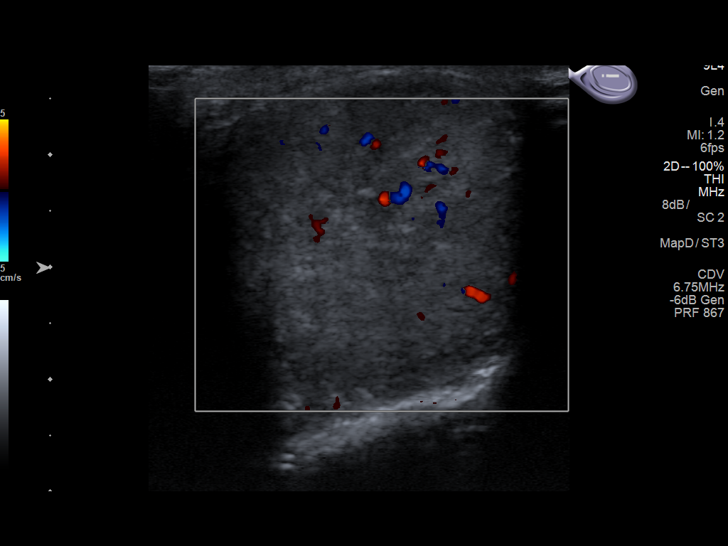
[im 23/46]
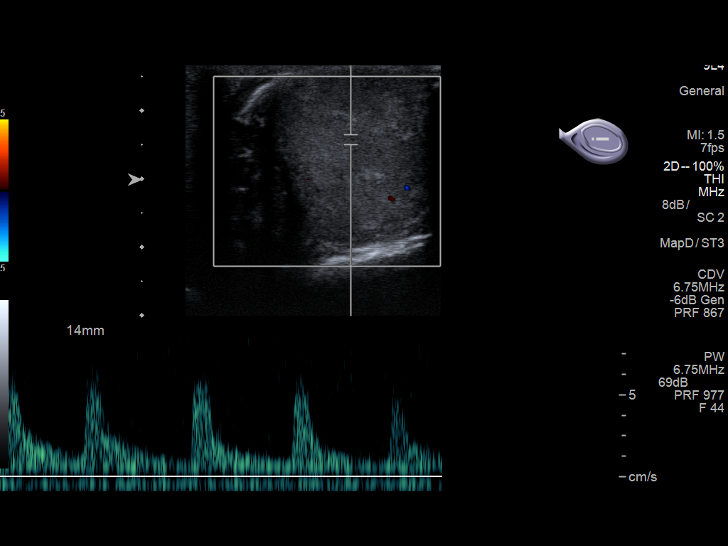
[im 27/46]
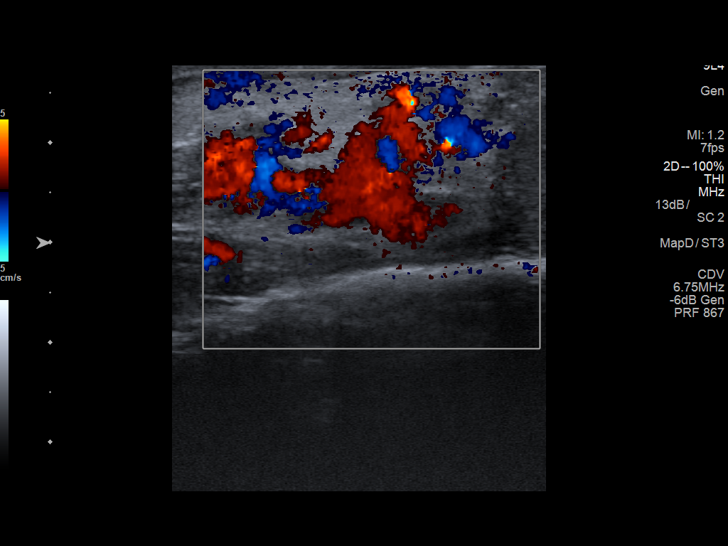
[im 31/46]
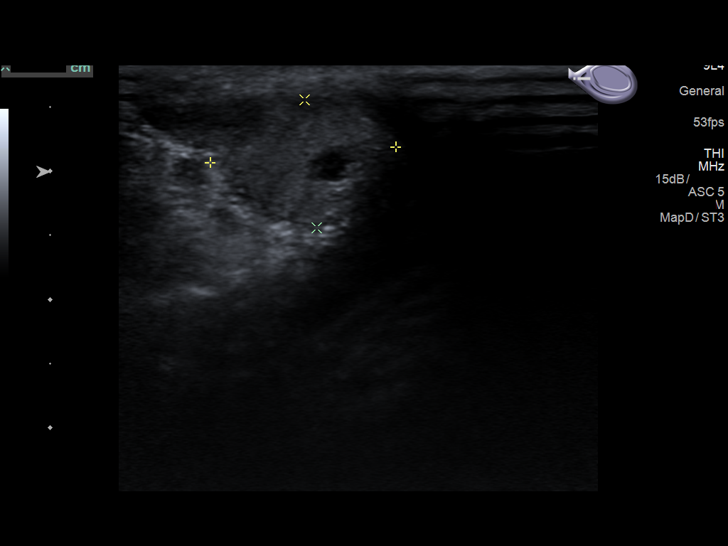
[im 34/46]
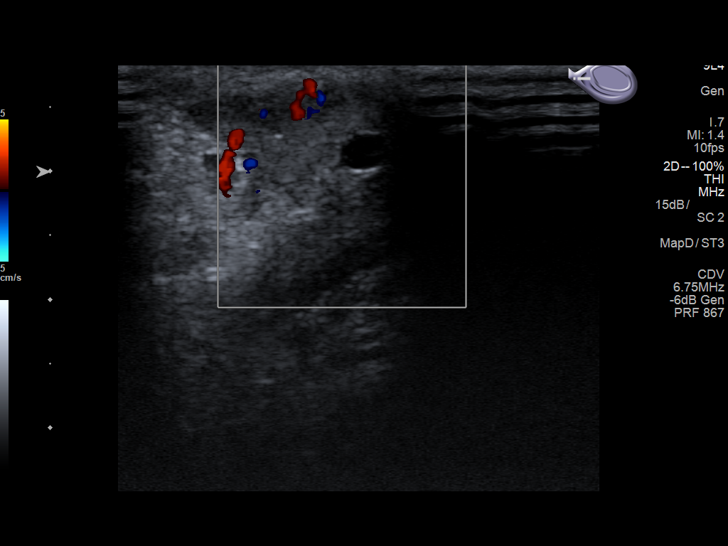
[im 38/46]
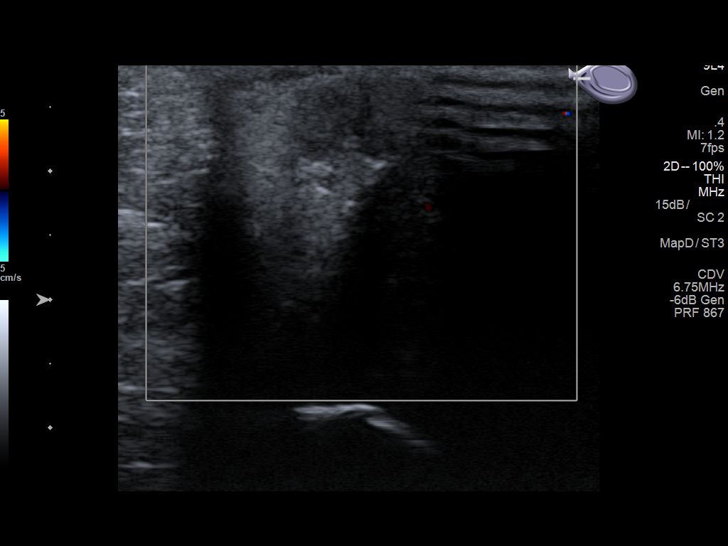
[im 42/46]
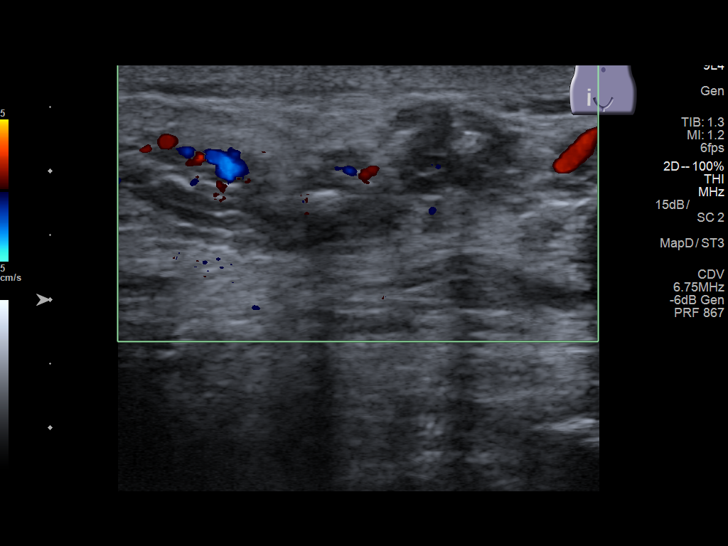
[im 46/46]
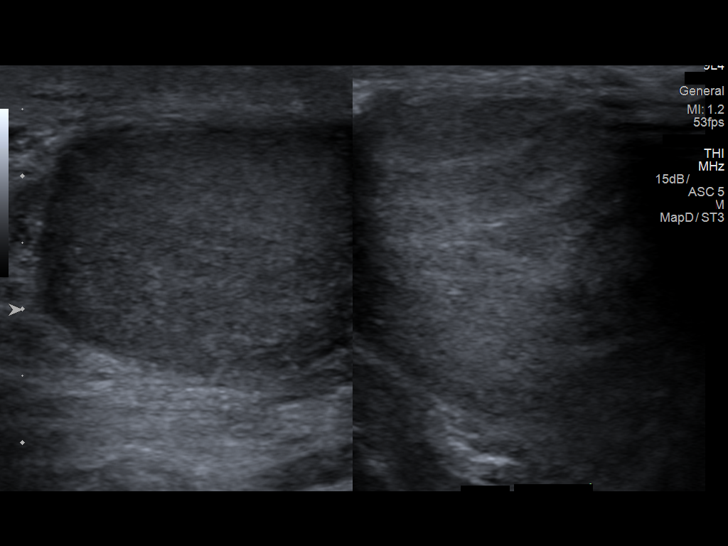

[13 of 25 positions shown; findings below may reference images not displayed]

FINDINGS: Right testicle

Measurements: 3.0 x 2.0 x 2.5 cm. No mass or microlithiasis
visualized.

Left testicle

Measurements: 3.5 x 3.2 x 2.6 cm. No discrete mass. Heterogeneous
echogenicity. No microlithiasis.

Right epididymis: Normal in size. 5 mm epididymal head cyst.
Otherwise unremarkable.

Left epididymis:  Normal in size and appearance.

Hydrocele:  None visualized.

Varicocele:  Bilateral varicoceles.

Pulsed Doppler interrogation of both testes demonstrates normal low
resistance arterial and venous waveforms bilaterally.
IMPRESSION: 1. Left testicle has heterogeneous echogenicity which is of unclear
etiology. There is no evidence of torsion. There is no increased
blood flow to suggest orchitis. There is no discrete mass.
2. Normal appearance of the right testicle.
3. Bilateral varicoceles.  No other abnormalities.
